# Patient Record
Sex: Female | Born: 1975 | Race: Black or African American | Hispanic: No | Marital: Married | Smoking: Current some day smoker
Health system: Southern US, Community
[De-identification: ages and names within clinical notes are randomized; demographics above are authoritative.]

## PROBLEM LIST (undated history)

## (undated) ENCOUNTER — Inpatient Hospital Stay (HOSPITAL_COMMUNITY): Payer: Self-pay

## (undated) ENCOUNTER — Emergency Department (HOSPITAL_COMMUNITY): Admission: EM | Payer: Medicaid Other | Source: Home / Self Care

## (undated) DIAGNOSIS — E669 Obesity, unspecified: Secondary | ICD-10-CM

## (undated) DIAGNOSIS — I10 Essential (primary) hypertension: Secondary | ICD-10-CM

## (undated) DIAGNOSIS — J9600 Acute respiratory failure, unspecified whether with hypoxia or hypercapnia: Secondary | ICD-10-CM

## (undated) DIAGNOSIS — O24419 Gestational diabetes mellitus in pregnancy, unspecified control: Secondary | ICD-10-CM

## (undated) DIAGNOSIS — F419 Anxiety disorder, unspecified: Secondary | ICD-10-CM

## (undated) DIAGNOSIS — J45909 Unspecified asthma, uncomplicated: Secondary | ICD-10-CM

## (undated) DIAGNOSIS — D509 Iron deficiency anemia, unspecified: Secondary | ICD-10-CM

## (undated) HISTORY — DX: Obesity, unspecified: E66.9

## (undated) HISTORY — PX: TONSILLECTOMY: SUR1361

## (undated) HISTORY — DX: Iron deficiency anemia, unspecified: D50.9

## (undated) HISTORY — DX: Anxiety disorder, unspecified: F41.9

## (undated) HISTORY — DX: Unspecified asthma, uncomplicated: J45.909

---
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
## 1998-02-21 ENCOUNTER — Emergency Department (HOSPITAL_COMMUNITY): Admission: EM | Admit: 1998-02-21 | Discharge: 1998-02-22 | Payer: Self-pay | Admitting: Emergency Medicine

## 1998-02-22 ENCOUNTER — Encounter: Payer: Self-pay | Admitting: Emergency Medicine

## 1999-03-22 ENCOUNTER — Inpatient Hospital Stay (HOSPITAL_COMMUNITY): Admission: AD | Admit: 1999-03-22 | Discharge: 1999-03-22 | Payer: Self-pay | Admitting: *Deleted

## 1999-05-02 ENCOUNTER — Inpatient Hospital Stay (HOSPITAL_COMMUNITY): Admission: AD | Admit: 1999-05-02 | Discharge: 1999-05-02 | Payer: Self-pay | Admitting: *Deleted

## 1999-06-08 ENCOUNTER — Ambulatory Visit (HOSPITAL_COMMUNITY): Admission: RE | Admit: 1999-06-08 | Discharge: 1999-06-08 | Payer: Self-pay | Admitting: *Deleted

## 1999-06-08 ENCOUNTER — Encounter: Payer: Self-pay | Admitting: *Deleted

## 1999-10-10 ENCOUNTER — Inpatient Hospital Stay (HOSPITAL_COMMUNITY): Admission: AD | Admit: 1999-10-10 | Discharge: 1999-10-10 | Payer: Self-pay | Admitting: *Deleted

## 1999-10-12 ENCOUNTER — Inpatient Hospital Stay (HOSPITAL_COMMUNITY): Admission: AD | Admit: 1999-10-12 | Discharge: 1999-10-14 | Payer: Self-pay | Admitting: *Deleted

## 1999-11-23 ENCOUNTER — Inpatient Hospital Stay (HOSPITAL_COMMUNITY): Admission: AD | Admit: 1999-11-23 | Discharge: 1999-11-23 | Payer: Self-pay | Admitting: *Deleted

## 2000-09-04 ENCOUNTER — Emergency Department (HOSPITAL_COMMUNITY): Admission: EM | Admit: 2000-09-04 | Discharge: 2000-09-04 | Payer: Self-pay | Admitting: Emergency Medicine

## 2001-04-09 ENCOUNTER — Other Ambulatory Visit: Admission: RE | Admit: 2001-04-09 | Discharge: 2001-04-09 | Payer: Self-pay | Admitting: Obstetrics and Gynecology

## 2001-04-17 ENCOUNTER — Ambulatory Visit (HOSPITAL_COMMUNITY): Admission: AD | Admit: 2001-04-17 | Discharge: 2001-04-17 | Payer: Self-pay | Admitting: Obstetrics and Gynecology

## 2001-04-17 ENCOUNTER — Encounter: Payer: Self-pay | Admitting: Obstetrics and Gynecology

## 2001-04-17 ENCOUNTER — Encounter (INDEPENDENT_AMBULATORY_CARE_PROVIDER_SITE_OTHER): Payer: Self-pay | Admitting: Specialist

## 2001-06-20 ENCOUNTER — Emergency Department (HOSPITAL_COMMUNITY): Admission: EM | Admit: 2001-06-20 | Discharge: 2001-06-20 | Payer: Self-pay | Admitting: Emergency Medicine

## 2001-06-20 ENCOUNTER — Encounter: Payer: Self-pay | Admitting: Emergency Medicine

## 2004-12-14 ENCOUNTER — Emergency Department (HOSPITAL_COMMUNITY): Admission: EM | Admit: 2004-12-14 | Discharge: 2004-12-14 | Payer: Self-pay | Admitting: Emergency Medicine

## 2005-01-16 ENCOUNTER — Encounter: Admission: RE | Admit: 2005-01-16 | Discharge: 2005-01-16 | Payer: Self-pay | Admitting: Rheumatology

## 2006-09-24 ENCOUNTER — Emergency Department (HOSPITAL_COMMUNITY): Admission: EM | Admit: 2006-09-24 | Discharge: 2006-09-24 | Payer: Self-pay | Admitting: Emergency Medicine

## 2007-02-03 ENCOUNTER — Inpatient Hospital Stay (HOSPITAL_COMMUNITY): Admission: AD | Admit: 2007-02-03 | Discharge: 2007-02-03 | Payer: Self-pay | Admitting: Obstetrics & Gynecology

## 2007-02-10 ENCOUNTER — Inpatient Hospital Stay (HOSPITAL_COMMUNITY): Admission: AD | Admit: 2007-02-10 | Discharge: 2007-02-10 | Payer: Self-pay | Admitting: Obstetrics

## 2007-05-06 ENCOUNTER — Other Ambulatory Visit: Payer: Self-pay | Admitting: Emergency Medicine

## 2007-05-06 ENCOUNTER — Ambulatory Visit: Payer: Self-pay | Admitting: *Deleted

## 2007-05-06 ENCOUNTER — Inpatient Hospital Stay (HOSPITAL_COMMUNITY): Admission: RE | Admit: 2007-05-06 | Discharge: 2007-05-08 | Payer: Self-pay | Admitting: *Deleted

## 2007-07-31 ENCOUNTER — Inpatient Hospital Stay (HOSPITAL_COMMUNITY): Admission: AD | Admit: 2007-07-31 | Discharge: 2007-07-31 | Payer: Self-pay | Admitting: Obstetrics

## 2007-08-02 ENCOUNTER — Inpatient Hospital Stay (HOSPITAL_COMMUNITY): Admission: AD | Admit: 2007-08-02 | Discharge: 2007-08-02 | Payer: Self-pay | Admitting: Obstetrics

## 2008-12-19 ENCOUNTER — Observation Stay (HOSPITAL_COMMUNITY): Admission: AD | Admit: 2008-12-19 | Discharge: 2008-12-20 | Payer: Self-pay | Admitting: Obstetrics & Gynecology

## 2008-12-20 ENCOUNTER — Encounter: Payer: Self-pay | Admitting: Obstetrics and Gynecology

## 2009-02-14 ENCOUNTER — Inpatient Hospital Stay (HOSPITAL_COMMUNITY): Admission: AD | Admit: 2009-02-14 | Discharge: 2009-02-14 | Payer: Self-pay | Admitting: Obstetrics and Gynecology

## 2009-04-02 ENCOUNTER — Inpatient Hospital Stay (HOSPITAL_COMMUNITY): Admission: AD | Admit: 2009-04-02 | Discharge: 2009-04-03 | Payer: Self-pay | Admitting: Obstetrics & Gynecology

## 2009-05-12 ENCOUNTER — Inpatient Hospital Stay (HOSPITAL_COMMUNITY): Admission: AD | Admit: 2009-05-12 | Discharge: 2009-05-12 | Payer: Self-pay | Admitting: Obstetrics and Gynecology

## 2009-05-17 ENCOUNTER — Inpatient Hospital Stay (HOSPITAL_COMMUNITY): Admission: AD | Admit: 2009-05-17 | Discharge: 2009-05-17 | Payer: Self-pay | Admitting: Obstetrics & Gynecology

## 2009-05-18 ENCOUNTER — Inpatient Hospital Stay (HOSPITAL_COMMUNITY): Admission: AD | Admit: 2009-05-18 | Discharge: 2009-05-19 | Payer: Self-pay | Admitting: Obstetrics & Gynecology

## 2010-03-28 ENCOUNTER — Emergency Department (HOSPITAL_COMMUNITY)
Admission: EM | Admit: 2010-03-28 | Discharge: 2010-03-28 | Payer: Self-pay | Source: Home / Self Care | Admitting: Emergency Medicine

## 2010-03-30 ENCOUNTER — Inpatient Hospital Stay (HOSPITAL_COMMUNITY): Admission: AD | Admit: 2010-03-30 | Discharge: 2009-04-26 | Payer: Self-pay | Admitting: Obstetrics & Gynecology

## 2010-07-04 LAB — POCT CARDIAC MARKERS
CKMB, poc: <1 ng/mL — ABNORMAL LOW (ref 1.0–8.0)
CKMB, poc: <1 ng/mL — ABNORMAL LOW (ref 1.0–8.0)
Myoglobin, poc: 47.3 ng/mL (ref 12–200)
Troponin i, poc: <0.05 ng/mL (ref 0.00–0.09)

## 2010-07-04 LAB — CBC
HCT: 36.5 % (ref 36.0–46.0)
Hemoglobin: 11.4 g/dL — ABNORMAL LOW (ref 12.0–15.0)
MCH: 23.7 pg — ABNORMAL LOW (ref 26.0–34.0)
RBC: 4.81 MIL/uL (ref 3.87–5.11)
WBC: 14.1 10*3/uL — ABNORMAL HIGH (ref 4.0–10.5)

## 2010-07-04 LAB — DIFFERENTIAL
Basophils Absolute: 0 10*3/uL (ref 0.0–0.1)
Basophils Relative: 0 % (ref 0–1)
Eosinophils Absolute: 0.5 10*3/uL (ref 0.0–0.7)
Eosinophils Relative: 3 % (ref 0–5)
Lymphs Abs: 3.6 10*3/uL (ref 0.7–4.0)
Monocytes Relative: 7 % (ref 3–12)
Neutro Abs: 9.2 10*3/uL — ABNORMAL HIGH (ref 1.7–7.7)
Neutrophils Relative %: 65 % (ref 43–77)

## 2010-07-04 LAB — D-DIMER, QUANTITATIVE: D-Dimer, Quant: 0.25 ug/mL-FEU (ref 0.00–0.48)

## 2010-07-04 LAB — POCT I-STAT, CHEM 8
BUN: 11 mg/dL (ref 6–23)
Creatinine, Ser: 0.7 mg/dL (ref 0.4–1.2)
Glucose, Bld: 96 mg/dL (ref 70–99)
Hemoglobin: 13.6 g/dL (ref 12.0–15.0)
Potassium: 3.4 mEq/L — ABNORMAL LOW (ref 3.5–5.1)
TCO2: 22 mmol/L (ref 0–100)

## 2010-07-10 LAB — CBC
HCT: 28.1 % — ABNORMAL LOW (ref 36.0–46.0)
MCHC: 31.2 g/dL (ref 30.0–36.0)
MCV: 78.4 fL (ref 78.0–100.0)
MCV: 79.5 fL (ref 78.0–100.0)
Platelets: 261 10*3/uL (ref 150–400)
RBC: 3.53 MIL/uL — ABNORMAL LOW (ref 3.87–5.11)
RDW: 13.1 % (ref 11.5–15.5)
WBC: 14.8 10*3/uL — ABNORMAL HIGH (ref 4.0–10.5)
WBC: 20.4 10*3/uL — ABNORMAL HIGH (ref 4.0–10.5)

## 2010-07-10 LAB — RPR: RPR Ser Ql: NONREACTIVE

## 2010-07-10 LAB — CCBB MATERNAL DONOR DRAW

## 2010-07-25 LAB — URINALYSIS, ROUTINE W REFLEX MICROSCOPIC
Hgb urine dipstick: NEGATIVE
Urobilinogen, UA: 1 mg/dL (ref 0.0–1.0)

## 2010-07-27 LAB — URINALYSIS, ROUTINE W REFLEX MICROSCOPIC
Glucose, UA: NEGATIVE mg/dL
Hgb urine dipstick: NEGATIVE
Ketones, ur: NEGATIVE mg/dL
Nitrite: NEGATIVE
Protein, ur: NEGATIVE mg/dL
Specific Gravity, Urine: 1.02 (ref 1.005–1.030)
Urobilinogen, UA: 1 mg/dL (ref 0.0–1.0)
pH: 6 (ref 5.0–8.0)

## 2010-07-27 LAB — WET PREP, GENITAL: Clue Cells Wet Prep HPF POC: NONE SEEN

## 2010-07-29 LAB — CULTURE, BLOOD (ROUTINE X 2): Culture: NO GROWTH

## 2010-07-29 LAB — DIFFERENTIAL
Basophils Absolute: 0.1 10*3/uL (ref 0.0–0.1)
Lymphocytes Relative: 13 % (ref 12–46)
Lymphs Abs: 2.7 10*3/uL (ref 0.7–4.0)
Neutro Abs: 16.3 10*3/uL — ABNORMAL HIGH (ref 1.7–7.7)
Neutrophils Relative %: 80 % — ABNORMAL HIGH (ref 43–77)

## 2010-07-29 LAB — CBC
HCT: 33.1 % — ABNORMAL LOW (ref 36.0–46.0)
Hemoglobin: 10.4 g/dL — ABNORMAL LOW (ref 12.0–15.0)
MCHC: 32 g/dL (ref 30.0–36.0)
Platelets: 259 10*3/uL (ref 150–400)
Platelets: 261 10*3/uL (ref 150–400)
RDW: 13 % (ref 11.5–15.5)
RDW: 13.1 % (ref 11.5–15.5)
WBC: 20.6 10*3/uL — ABNORMAL HIGH (ref 4.0–10.5)

## 2010-07-29 LAB — COMPREHENSIVE METABOLIC PANEL
ALT: 8 U/L (ref 0–35)
Alkaline Phosphatase: 46 U/L (ref 39–117)
BUN: 5 mg/dL — ABNORMAL LOW (ref 6–23)
CO2: 20 mEq/L (ref 19–32)
Chloride: 105 mEq/L (ref 96–112)
Glucose, Bld: 92 mg/dL (ref 70–99)
Potassium: 3.4 mEq/L — ABNORMAL LOW (ref 3.5–5.1)
Sodium: 134 mEq/L — ABNORMAL LOW (ref 135–145)
Total Bilirubin: 0.4 mg/dL (ref 0.3–1.2)
Total Protein: 6.7 g/dL (ref 6.0–8.3)

## 2010-07-29 LAB — URINALYSIS, ROUTINE W REFLEX MICROSCOPIC
Glucose, UA: NEGATIVE mg/dL
Hgb urine dipstick: NEGATIVE
Ketones, ur: NEGATIVE mg/dL
Nitrite: NEGATIVE
Urobilinogen, UA: 1 mg/dL (ref 0.0–1.0)

## 2010-07-29 LAB — BILE ACIDS, TOTAL: Bile Acids Total: 3 umol/L (ref 0–10)

## 2010-07-29 LAB — WET PREP, GENITAL
Clue Cells Wet Prep HPF POC: NONE SEEN
Yeast Wet Prep HPF POC: NONE SEEN

## 2010-07-29 LAB — GC/CHLAMYDIA PROBE AMP, GENITAL: Chlamydia, DNA Probe: NEGATIVE

## 2010-07-29 LAB — LIPASE, BLOOD: Lipase: 45 U/L (ref 11–59)
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
## 2010-09-08 NOTE — Op Note (Signed)
[facility]  Patient:    Jenna Stephens, Jenna Stephens Visit Number: [id number] MRN: [id number]          Service Type: DSU Location: [facility] Attending Physician:  Shaune Spittle Dictated by:   Maris Berger. Pennie Rushing, M.D. Proc. Date: 04/17/01 Admit Date:  04/17/2001                             Operative Report  PREOPERATIVE DIAGNOSES:       Incomplete spontaneous abortion.  POSTOPERATIVE DIAGNOSES:      Incomplete spontaneous abortion.  OPERATION:                    Suction dilatation and evacuation.  ANESTHESIA:                   Monitored anesthesia care and local.  ESTIMATED BLOOD LOSS:         Less than 100 cc.  COMPLICATIONS:                None.  FINDINGS:                     A moderate amount of products of conception were obtained at D&E.  SURGEON:                      Vanessa P. Pennie Rushing, M.D.  PROCEDURE:                    A discussion was held with the patient concerning the findings on ultrasound which included no viable intrauterine pregnancy, but products of conception in the endometrial cavity.  A recommendation was made for dilatation and evacuation and the risks of anesthesia, bleeding, infection, damage to adjacent organs, and uterine perforation all explained.  The patient wished to proceed.  She was taken to the operating room and placed on the operating table after appropriate identification.  She was placed in the lithotomy position and equipment for monitored anesthesia care placed.  The perineum and vagina were prepped with multiple layers of Betadine and draped as a sterile field.  A Red Robinson catheter was used to empty the bladder.  A Graves speculum was placed in the vagina and a single tooth tenaculum on the cervix.  The cervix was already open and dilated enough to accommodate a #8 suction catheter.  This was used to suck and evacuate all quadrants of the uterus.  A sharp curette was used to ensure that all products  of conception had been removed.  Hemostasis was noted to be adequate and all instruments were removed from the vagina.  The single tooth tenaculum was removed and silver nitrate used to cauterize the tenaculum sticks.  LABORATORIES:                 Blood type was pending at the time of this discharge.  FOLLOWUP:                     Two weeks with Dr. Pennie Rushing.  DISCHARGE INSTRUCTIONS:       Printed instructions from [facility].  DISCHARGE MEDICATIONS:        1. Methergine 0.2 mg p.o. q.6h. for eight doses.  2. Ibuprofen over-the-counter 600 mg p.o. q.6h.                                  p.r.n. pain.                               3. Doxycycline 100 mg p.o. b.i.d. for five days. Dictated by:   Maris Berger. Pennie Rushing, M.D. Attending Physician:  Shaune Spittle DD:  04/17/01 TD:  04/18/01 Job: [id number]

## 2010-09-08 NOTE — H&P (Signed)
[facility]  Patient:    Jenna Stephens, Jenna Stephens Visit Number: [id number] MRN: [id number]          Service Type: GYN Location: [facility] Attending Physician:  Shaune Spittle Dictated by:   Nigel Bridgeman, C.N.M. Admit Date:  04/17/2001                           History and Physical  HISTORY OF PRESENT ILLNESS:   Ms. Maryruth Bun is a 34 year old gravida 2, para 1-0-0-1 at approximately [redacted] weeks gestation who presented to the office today with heavy vaginal bleeding. She had been seen in the office last week and had had a new OB interview with an ultrasound that showed a viable intrauterine pregnancy at approximately [redacted] weeks gestation. She had onset of heavy bleeding and cramping yesterday soaking one pad every 30 minutes. She was having moderate cramping today. She presented to the office. At which time, she was sent to maternity admissions for lab work and a pelvic ultrasound.  PREGNANCY HAS BEEN REMARKABLE FOR:                          1. Positive anti LEA antibody.                               2. Previous smoker.  HISTORY OF PRESENT PREGNANCY:                    The patients last menstrual period was in August. By ultrasound last week, she was approximately 8 weeks. Blood type was done last week, and she was A positive. Hemoglobin was within normal limits. WBC count was 13.9.  OBSTETRICAL HISTORY:          The patient has had one previous vaginal birth.  MEDICAL HISTORY:              She reports the usual childhood illnesses. She has had a history of Trichomonas.  PREVIOUS HOSPITALIZATIONS:    Vaginal delivery x 1.  PAST SURGICAL HISTORY:        Tonsillectomy at age 77 and wisdom teeth removal.  SOCIAL HISTORY:               The patient is single. The father of the baby is involved and supportive. The patient is unemployed. She denies any alcohol or drug use during this pregnancy. She was a smoker prior to her positive UPT.  FAMILY HISTORY:                Noncontributory.  PHYSICAL EXAMINATION:  VITAL SIGNS:                  The patients temperature is 98.2, pulse is 77, blood pressure is 144/83, 137/72, orthostatics are essentially within normal limits, although there is a slight elevation of her pulse when she went from sitting to standing. Blood pressures are stable.  HEENT:                        Within normal limits.  LUNGS:                        Breath sounds are clear.  HEART:  Regular rate and rhythm without murmur.  BREASTS:                      Soft and nontender.  ABDOMEN:                      Soft, slightly tender to deep palpation.  PELVIC:                       Done in the office by Wynelle Bourgeois, C.N.M. External genitalia normal. Vagina remarkable for moderate to heavy vaginal bleeding. Cervix is closed. Uterus is small, slightly tender. Adnexa within normal limits. Rectovaginal deferred.  EXTREMITIES:                  Deep tendon reflexes are 2+ without clonus. There is a trace edema noted.  LABORATORY DATA:              CBC today shows hemoglobin of 10.6, hematocrit of 33.4, platelet count of 327, and WBC count of 16. WBC count at visit approximately one week ago was 13.9. ______ was also drawn. Ultrasound today shows no intrauterine gestational sac. Endometrial stripe was not well seen. The findings were favoring a retained products of conception with echogenic heterogeneous material of approximately 3 cm. Ovaries were within normal limits.  IMPRESSION:                   1. Incomplete spontaneous abortion.                               2. Anti LEA anitbody.  PLAN:                         1. Consulting with Vanessa P. Pennie Rushing, M.D. as                                  attending physician.                               2. Plan D&E with Vanessa P. Haygood, M.D.                                  planning to schedule per OR availability.                               3. Risks and  benefits of the procedure were                                  reviewed with patient and her support person,                                  including bleeding, infection, and risk of                                  damage to other organs. The patient last had  solids on December 25 at 7:30 p.m. and last                                  fluids December 26 at 7 a.m.                               4. Support offer for patient loss.                               5. Anti LEA antibody titer ordered. Dictated by:   Nigel Bridgeman, C.N.M. Attending Physician:  Shaune Spittle DD:  04/17/01 TD:  04/17/01 Job: [id number]

## 2010-09-08 NOTE — Consult Note (Signed)
[facility]  Patient:    Jenna Stephens, Jenna Stephens Visit Number: [id number] MRN: [id number]          Service Type: EXP Location: ED Attending Physician:  Tobey Bride Dictated by:   Cammy Copa, M.D. Admit Date:  06/20/2001 Discharge Date: 06/20/2001   CC:         Tomi Bamberger, P.A.   Consultation Report  REQUESTING PHYSICIAN:  Tomi Bamberger, P.A., [facility] Emergency Room.  CHIEF COMPLAINT:  Right ankle pain.  HISTORY OF PRESENT ILLNESS:  The patient is a 35 year old patient who slipped on the ice today. She reports right ankle pain and inability to bear weight. She denies any other orthopedic complaints. There was no loss of consciousness.  PAST MEDICAL/SURGICAL HISTORY:  Unremarkable.  MEDICATIONS:  She is currently is on no medication.  ALLERGIES:  No known drug allergies.  SOCIAL HISTORY:  She lives with her mother and brother at home.  PHYSICAL EXAMINATION:  EXTREMITIES:  She does have swelling around the lateral malleolus. Pedal pulses are intact. Sensation is intact on the dorsal plantar surface of the foot. She has no medial-sided tenderness. There is no swelling in the knee.  RADIOLOGY:  X-rays demonstrate minimally displaced lateral malleolar fracture. The mortis is maintained, and the fibular length is also maintained within 1 mm.  IMPRESSION:  Minimally displaced lateral malleolus fracture.  PLAN:  I am going to put her in a posterior splint, nonweightbearing for the next week. I will see back in the clinic with repeat x-rays, out of the splint and change over to cast at that time.  DISCHARGE MEDICATIONS:  Include Percocet for pain as well as an aspirin a day while she is nonweight bearing. Dictated by:   Cammy Copa, M.D. Attending Physician:  Tobey Bride DD:  06/20/01 TD:  06/20/01 Job: [id number]
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
## 2011-01-11 LAB — DIFFERENTIAL
Basophils Absolute: 0.3 — ABNORMAL HIGH
Basophils Relative: 2 — ABNORMAL HIGH
Eosinophils Absolute: 0.1
Eosinophils Relative: 1
Lymphocytes Relative: 13
Lymphs Abs: 1.9
Monocytes Absolute: 0.4
Monocytes Relative: 3
Neutro Abs: 12.2 — ABNORMAL HIGH
Neutrophils Relative %: 81 — ABNORMAL HIGH

## 2011-01-11 LAB — COMPREHENSIVE METABOLIC PANEL
ALT: 11
AST: 14
Albumin: 3.8
Alkaline Phosphatase: 62
BUN: 6
CO2: 23
Calcium: 9.1
Chloride: 105
Creatinine, Ser: 0.82
GFR calc Af Amer: >60
GFR calc non Af Amer: >60
Glucose, Bld: 97
Potassium: 3.5
Sodium: 138
Total Bilirubin: 0.5
Total Protein: 7.8

## 2011-01-11 LAB — CBC
HCT: 37.1
HCT: 33.7 — ABNORMAL LOW
Hemoglobin: 11.7 — ABNORMAL LOW
MCHC: 31.5
MCV: 72.1 — ABNORMAL LOW
MCV: 71.7 — ABNORMAL LOW
Platelets: 371
Platelets: 276
RBC: 5.15 — ABNORMAL HIGH
RDW: 14.9
WBC: 14.9 — ABNORMAL HIGH
WBC: 7.4

## 2011-01-11 LAB — URINALYSIS, ROUTINE W REFLEX MICROSCOPIC
Bilirubin Urine: NEGATIVE
Glucose, UA: NEGATIVE
Hgb urine dipstick: NEGATIVE
Ketones, ur: NEGATIVE
Nitrite: NEGATIVE
Protein, ur: NEGATIVE
Specific Gravity, Urine: 1.01
Urobilinogen, UA: 0.2
pH: 6

## 2011-01-11 LAB — PREGNANCY, URINE: Preg Test, Ur: NEGATIVE

## 2011-01-11 LAB — RAPID URINE DRUG SCREEN, HOSP PERFORMED
Amphetamines: POSITIVE — AB
Barbiturates: NOT DETECTED
Benzodiazepines: POSITIVE — AB
Cocaine: NOT DETECTED
Opiates: NOT DETECTED
Tetrahydrocannabinol: POSITIVE — AB

## 2011-01-11 LAB — ETHANOL: Alcohol, Ethyl (B): 17 — ABNORMAL HIGH

## 2011-01-11 LAB — ACETAMINOPHEN LEVEL: Acetaminophen (Tylenol), Serum: <10 — ABNORMAL LOW

## 2011-01-16 LAB — URINE MICROSCOPIC-ADD ON

## 2011-01-16 LAB — URINALYSIS, ROUTINE W REFLEX MICROSCOPIC
Glucose, UA: NEGATIVE
Protein, ur: NEGATIVE

## 2011-01-16 LAB — CBC
Hemoglobin: 10.6 — ABNORMAL LOW
RBC: 4.57
WBC: 15.7 — ABNORMAL HIGH

## 2011-01-16 LAB — HCG, QUANTITATIVE, PREGNANCY: hCG, Beta Chain, Quant, S: 387 — ABNORMAL HIGH

## 2011-01-16 LAB — ABO/RH: ABO/RH(D): A POS

## 2011-02-01 LAB — ABO/RH: ABO/RH(D): A POS

## 2011-02-01 LAB — URINE MICROSCOPIC-ADD ON

## 2011-02-01 LAB — URINALYSIS, ROUTINE W REFLEX MICROSCOPIC
Glucose, UA: NEGATIVE
Ketones, ur: NEGATIVE
Leukocytes, UA: NEGATIVE
pH: 6

## 2011-02-01 LAB — WET PREP, GENITAL: Clue Cells Wet Prep HPF POC: NONE SEEN

## 2011-02-01 LAB — CBC
Hemoglobin: 11.2 — ABNORMAL LOW
RBC: 4.99

## 2011-02-01 LAB — POCT PREGNANCY, URINE: Preg Test, Ur: POSITIVE

## 2011-02-01 LAB — GC/CHLAMYDIA PROBE AMP, GENITAL: GC Probe Amp, Genital: NEGATIVE

## 2012-03-22 ENCOUNTER — Encounter (HOSPITAL_COMMUNITY): Payer: Self-pay | Admitting: Emergency Medicine

## 2012-03-22 ENCOUNTER — Emergency Department (HOSPITAL_COMMUNITY): Payer: Self-pay

## 2012-03-22 ENCOUNTER — Emergency Department (HOSPITAL_COMMUNITY)
Admission: EM | Admit: 2012-03-22 | Discharge: 2012-03-22 | Disposition: A | Discharge status: Home / Self Care | Payer: Self-pay | Attending: Emergency Medicine | Admitting: Emergency Medicine

## 2012-03-22 DIAGNOSIS — IMO0002 Reserved for concepts with insufficient information to code with codable children: Secondary | ICD-10-CM | POA: Insufficient documentation

## 2012-03-22 DIAGNOSIS — M2570 Osteophyte, unspecified joint: Secondary | ICD-10-CM

## 2012-03-22 DIAGNOSIS — F17200 Nicotine dependence, unspecified, uncomplicated: Secondary | ICD-10-CM | POA: Insufficient documentation

## 2012-03-22 DIAGNOSIS — M25529 Pain in unspecified elbow: Secondary | ICD-10-CM | POA: Insufficient documentation

## 2012-03-22 DIAGNOSIS — M898X9 Other specified disorders of bone, unspecified site: Secondary | ICD-10-CM | POA: Insufficient documentation

## 2012-03-22 DIAGNOSIS — Y929 Unspecified place or not applicable: Secondary | ICD-10-CM | POA: Insufficient documentation

## 2012-03-22 DIAGNOSIS — Y939 Activity, unspecified: Secondary | ICD-10-CM | POA: Insufficient documentation

## 2012-03-22 MED ORDER — NAPROXEN 500 MG PO TABS: 500.0000 mg | ORAL_TABLET | Freq: Two times a day (BID) | ORAL | Status: DC
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
## 2012-03-22 NOTE — ED Provider Notes (Signed)
History     CSN: [id number]  Arrival date & time 03/22/12  1532   First MD Initiated Contact with Patient 03/22/12 1603      Chief Complaint  Patient presents with  . Elbow Pain    3 week hx of elbow pain.    (Consider location/radiation/quality/duration/timing/severity/associated sxs/prior treatment) HPI Comments: 36 year old female presents emergency department with chief complaint of left elbow pain.  Onset of symptoms began about 3-4 weeks ago after the patient reports striking her elbow very hard against a wall.  Since then the pain comes and goes and she has intermittent tingling of her fingers.  She denies any weakness of the extremity and states that it is tender to palpation.  No other complaints at this time.  The history is provided by the patient.    History reviewed. No pertinent past medical history.  History reviewed. No pertinent past surgical history.  Family History  Problem Relation Age of Onset  . Diabetes Mother   . Hypertension Mother     History  Substance Use Topics  . Smoking status: Current Every Day Smoker    Types: Cigarettes  . Smokeless tobacco: Not on file  . Alcohol Use: Yes    OB History    Grav Para Term Preterm Abortions TAB SAB Ect Mult Living                  Review of Systems  Constitutional: Negative for fever, diaphoresis and activity change.  HENT: Negative for congestion and neck pain.   Respiratory: Negative for cough.   Genitourinary: Negative for dysuria.  Musculoskeletal: Negative for myalgias.  Skin: Negative for color change and wound.  Neurological: Negative for headaches.  All other systems reviewed and are negative.    Allergies  Review of patient's allergies indicates no known allergies.  Home Medications   Current Outpatient Rx  Name  Route  Sig  Dispense  Refill  . ACETAMINOPHEN 500 MG PO TABS   Oral   Take 1,000 mg by mouth every 6 (six) hours as needed. For pain         . NAPROXEN SODIUM 220  MG PO TABS   Oral   Take 440 mg by mouth 2 (two) times daily as needed. For pain           BP 130/79  Pulse 97  Temp 98.1 F (36.7 C) (Oral)  SpO2 99%  LMP 02/26/2012  Physical Exam  Nursing note and vitals reviewed. Constitutional: She appears well-developed and well-nourished. No distress.  HENT:  Head: Normocephalic and atraumatic.  Eyes: Conjunctivae normal and EOM are normal.  Neck: Normal range of motion. Neck supple.  Cardiovascular:       Intact distal pulses, capillary refill < 3 seconds  Musculoskeletal:       Left upper extremity: ttp at  the olecranon process.  Normal flexion and extension. Pain free supination/pronation.  All other extremities with normal ROM  Neurological:       No sensory deficit  Skin: She is not diaphoretic.       Skin intact, no tenting    ED Course  Procedures (including critical care time)  Labs Reviewed - No data to display Dg Elbow Complete Left  03/22/2012  *RADIOLOGY REPORT*  Clinical Data: Elbow pain  LEFT ELBOW - COMPLETE 3+ VIEW  Comparison: None.  Findings: No acute fracture, malalignment, or elbow joint effusion. There is an osteophyte/enthesophyte at the insertion of the triceps tendon on the  olecranon process.  A faint lucency through the center of the bony prominence may represent a subacute fracture line.  IMPRESSION:  Possible subacute fracture of an osteophyte/enthesophyte in the region of the insertion of the triceps tendon on the posterior olecranon process.  Otherwise, negative radiographs of the elbow.   Original Report Authenticated By: Malachy Moan, M.D.      No diagnosis found.    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 Patient X-Ray negative for significant fracture or dislocation. Pain managed in ED. Pt advised to follow up with orthopedics if symptoms persist for possibility of missed fracture diagnosis.  Conservative therapy recommended and discussed. Patient will be dc home & is agreeable with above plan.          Jaci Carrel, [place] 03/22/12 1635

## 2012-03-22 NOTE — ED Provider Notes (Signed)
Medical screening examination/treatment/procedure(s) were performed by non-physician practitioner and as supervising physician I was immediately available for consultation/collaboration.  Camdyn Laden L Juliet Vasbinder, MD 03/22/12 2329 

## 2012-10-02 ENCOUNTER — Encounter (HOSPITAL_BASED_OUTPATIENT_CLINIC_OR_DEPARTMENT_OTHER): Payer: Self-pay | Admitting: *Deleted

## 2012-10-02 ENCOUNTER — Emergency Department (HOSPITAL_BASED_OUTPATIENT_CLINIC_OR_DEPARTMENT_OTHER)
Admission: EM | Admit: 2012-10-02 | Discharge: 2012-10-02 | Disposition: A | Discharge status: Home / Self Care | Payer: Self-pay | Attending: Emergency Medicine | Admitting: Emergency Medicine

## 2012-10-02 DIAGNOSIS — R05 Cough: Secondary | ICD-10-CM | POA: Insufficient documentation

## 2012-10-02 DIAGNOSIS — I10 Essential (primary) hypertension: Secondary | ICD-10-CM | POA: Insufficient documentation

## 2012-10-02 DIAGNOSIS — R6883 Chills (without fever): Secondary | ICD-10-CM | POA: Insufficient documentation

## 2012-10-02 DIAGNOSIS — J3489 Other specified disorders of nose and nasal sinuses: Secondary | ICD-10-CM | POA: Insufficient documentation

## 2012-10-02 DIAGNOSIS — R0602 Shortness of breath: Secondary | ICD-10-CM | POA: Insufficient documentation

## 2012-10-02 DIAGNOSIS — R059 Cough, unspecified: Secondary | ICD-10-CM | POA: Insufficient documentation

## 2012-10-02 DIAGNOSIS — R5381 Other malaise: Secondary | ICD-10-CM | POA: Insufficient documentation

## 2012-10-02 DIAGNOSIS — F17200 Nicotine dependence, unspecified, uncomplicated: Secondary | ICD-10-CM | POA: Insufficient documentation

## 2012-10-02 MED ORDER — AMOXICILLIN 500 MG PO CAPS: 500.0000 mg | ORAL_CAPSULE | Freq: Two times a day (BID) | ORAL | Status: DC

## 2012-10-02 MED ORDER — ALBUTEROL SULFATE HFA 108 (90 BASE) MCG/ACT IN AERS
INHALATION_SPRAY | RESPIRATORY_TRACT | Status: AC
  Filled 2012-10-02: qty 6.7

## 2012-10-02 MED ORDER — ALBUTEROL SULFATE HFA 108 (90 BASE) MCG/ACT IN AERS
2.0000 | INHALATION_SPRAY | Freq: Once | RESPIRATORY_TRACT | Status: AC
  Administered 2012-10-02: 2 via RESPIRATORY_TRACT

## 2012-10-02 NOTE — ED Notes (Signed)
2 week history of cough and congestion non productive cough taking alka seltzer and mucinex not helping

## 2012-10-02 NOTE — ED Provider Notes (Signed)
History     CSN: [id number]  Arrival date & time 10/02/12  4098   First MD Initiated Contact with Patient 10/02/12 [phone]      Chief Complaint  Patient presents with  . Nasal Congestion  . Cough     Patient is a 37 y.o. female presenting with cough. The history is provided by the patient.  Cough Cough characteristics:  Non-productive Severity:  Moderate Onset quality:  Gradual Duration:  2 weeks Timing:  Intermittent Progression:  Worsening Chronicity:  New Smoker: yes   Relieved by:  Nothing Worsened by:  Smoking Ineffective treatments:  Decongestant Associated symptoms: chills, shortness of breath and sinus congestion   Associated symptoms: no chest pain, no fever and no wheezing     PMH - none  History reviewed. No pertinent past surgical history.  Family History  Problem Relation Age of Onset  . Diabetes Mother   . Hypertension Mother     History  Substance Use Topics  . Smoking status: Current Every Day Smoker    Types: Cigarettes  . Smokeless tobacco: Not on file  . Alcohol Use: Yes    OB History   Grav Para Term Preterm Abortions TAB SAB Ect Mult Living                  Review of Systems  Constitutional: Positive for chills and fatigue. Negative for fever.  Respiratory: Positive for cough and shortness of breath. Negative for wheezing.   Cardiovascular: Negative for chest pain.  Gastrointestinal: Negative for vomiting.  Neurological: Negative for weakness.  All other systems reviewed and are negative.    Allergies  Review of patient's allergies indicates no known allergies.  Home Medications   Current Outpatient Rx  Name  Route  Sig  Dispense  Refill  . amoxicillin (AMOXIL) 500 MG capsule   Oral   Take 1 capsule (500 mg total) by mouth 2 (two) times daily.   14 capsule   0     BP 154/103  Pulse 81  Temp(Src) 97.9 F (36.6 C) (Oral)  Resp 20  SpO2 100%  LMP 09/30/2012  Physical Exam CONSTITUTIONAL: Well developed/well  nourished HEAD: Normocephalic/atraumatic EYES: EOMI/PERRL ENMT: Mucous membranes moist, nasal congestion NECK: supple no meningeal signs SPINE:entire spine nontender CV: S1/S2 noted, no murmurs/rubs/gallops noted LUNGS: Lungs are clear to auscultation bilaterally, no apparent distress, no crackles, no wheeze, no distress, no tachypnea noted on my evaluation ABDOMEN: soft, nontender, no rebound or guarding GU:no cva tenderness NEURO: Pt is awake/alert, moves all extremitiesx4 EXTREMITIES: pulses normal, full ROM No obvious pitting edema to bilateral LE SKIN: warm, color normal PSYCH: no abnormalities of mood noted  ED Course  Procedures   1. Cough   2. HTN (hypertension)    Pt is a smoker (advised to quit) with two weeks of nonproductive cough without hemoptysis She is well appearing, no distress No history/physical exam findings to suggest CHF/PE/ACS Given 2 weeks of cough will add on amoxicillin and trial of albuterol (no wheeze but she is a smoker, may benefit) She reports h/o HTN but is untreated and likely chronic I advised her f/u for this as outpatient    MDM  Nursing notes including past medical history and social history reviewed and considered in documentation         Joya Gaskins, MD 10/02/12 1008

## 2013-06-29 ENCOUNTER — Emergency Department (HOSPITAL_COMMUNITY)
Admission: EM | Admit: 2013-06-29 | Discharge: 2013-06-29 | Disposition: A | Discharge status: Home / Self Care | Payer: Medicaid Other | Attending: Emergency Medicine | Admitting: Emergency Medicine

## 2013-06-29 ENCOUNTER — Encounter (HOSPITAL_COMMUNITY): Payer: Self-pay | Admitting: Emergency Medicine

## 2013-06-29 ENCOUNTER — Emergency Department (HOSPITAL_COMMUNITY): Payer: Medicaid Other

## 2013-06-29 DIAGNOSIS — X500XXA Overexertion from strenuous movement or load, initial encounter: Secondary | ICD-10-CM | POA: Insufficient documentation

## 2013-06-29 DIAGNOSIS — F17200 Nicotine dependence, unspecified, uncomplicated: Secondary | ICD-10-CM | POA: Insufficient documentation

## 2013-06-29 DIAGNOSIS — Y939 Activity, unspecified: Secondary | ICD-10-CM | POA: Insufficient documentation

## 2013-06-29 DIAGNOSIS — Y929 Unspecified place or not applicable: Secondary | ICD-10-CM | POA: Insufficient documentation

## 2013-06-29 DIAGNOSIS — S93409A Sprain of unspecified ligament of unspecified ankle, initial encounter: Secondary | ICD-10-CM | POA: Insufficient documentation

## 2013-06-29 DIAGNOSIS — Z792 Long term (current) use of antibiotics: Secondary | ICD-10-CM | POA: Insufficient documentation

## 2013-06-29 MED ORDER — ONDANSETRON 8 MG PO TBDP
8.0000 mg | ORAL_TABLET | Freq: Once | ORAL | Status: AC
  Administered 2013-06-29: 8 mg via ORAL
  Filled 2013-06-29: qty 1

## 2013-06-29 MED ORDER — HYDROCODONE-ACETAMINOPHEN 5-325 MG PO TABS: 1.0000 | ORAL_TABLET | Freq: Four times a day (QID) | ORAL | Status: DC | PRN

## 2013-06-29 MED ORDER — PROMETHAZINE HCL 25 MG PO TABS: 25.0000 mg | ORAL_TABLET | Freq: Four times a day (QID) | ORAL | Status: DC | PRN

## 2013-06-29 MED ORDER — HYDROCODONE-ACETAMINOPHEN 5-325 MG PO TABS
2.0000 | ORAL_TABLET | Freq: Once | ORAL | Status: AC
  Administered 2013-06-29: 2 via ORAL
  Filled 2013-06-29: qty 2

## 2013-06-29 NOTE — ED Notes (Signed)
Pt states that Friday night she twisted her right ankle stepping off a curb. Slightly more swollen than left ankle. Pt able to bear some weight on right foot.

## 2013-06-29 NOTE — Discharge Instructions (Signed)
Acute Ankle Sprain  with Phase I Rehab  An acute ankle sprain is a partial or complete tear in one or more of the ligaments of the ankle due to traumatic injury. The severity of the injury depends on both the the number of ligaments sprained and the grade of sprain. There are 3 grades of sprains.   · A grade 1 sprain is a mild sprain. There is a slight pull without obvious tearing. There is no loss of strength, and the muscle and ligament are the correct length.  · A grade 2 sprain is a moderate sprain. There is tearing of fibers within the substance of the ligament where it connects two bones or two cartilages. The length of the ligament is increased, and there is usually decreased strength.  · A grade 3 sprain is a complete rupture of the ligament and is uncommon.  In addition to the grade of sprain, there are three types of ankle sprains.   Lateral ankle sprains: This is a sprain of one or more of the three ligaments on the outer side (lateral) of the ankle. These are the most common sprains.  Medial ankle sprains: There is one large triangular ligament of the inner side (medial) of the ankle that is susceptible to injury. Medial ankle sprains are less common.  Syndesmosis, "high ankle," sprains: The syndesmosis is the ligament that connects the two bones of the lower leg. Syndesmosis sprains usually only occur with very severe ankle sprains.  SYMPTOMS  · Pain, tenderness, and swelling in the ankle, starting at the side of injury that may progress to the whole ankle and foot with time.  · "Pop" or tearing sensation at the time of injury.  · Bruising that may spread to the heel.  · Impaired ability to walk soon after injury.  CAUSES   · Acute ankle sprains are caused by trauma placed on the ankle that temporarily forces or pries the anklebone (talus) out of its normal socket.  · Stretching or tearing of the ligaments that normally hold the joint in place (usually due to a twisting injury).  RISK INCREASES  WITH:  · Previous ankle sprain.  · Sports in which the foot may land awkwardly (ie. basketball, volleyball, or soccer) or walking or running on uneven or rough surfaces.  · Shoes with inadequate support to prevent sideways motion when stress occurs.  · Poor strength and flexibility.  · Poor balance skills.  · Contact sports.  PREVENTION   · Warm up and stretch properly before activity.  · Maintain physical fitness:  · Ankle and leg flexibility, muscle strength, and endurance.  · Cardiovascular fitness.  · Balance training activities.  · Use proper technique and have a coach correct improper technique.  · Taping, protective strapping, bracing, or high-top tennis shoes may help prevent injury. Initially, tape is best; however, it loses most of its support function within 10 to 15 minutes.  · Wear proper fitted protective shoes (High-top shoes with taping or bracing is more effective than either alone).  · Provide the ankle with support during sports and practice activities for 12 months following injury.  PROGNOSIS   · If treated properly, ankle sprains can be expected to recover completely; however, the length of recovery depends on the degree of injury.  · A grade 1 sprain usually heals enough in 5 to 7 days to allow modified activity and requires an average of 6 weeks to heal completely.  · A grade 2 sprain requires   6 to 10 weeks to heal completely.  · A grade 3 sprain requires 12 to 16 weeks to heal.  · A syndesmosis sprain often takes more than 3 months to heal.  RELATED COMPLICATIONS   · Frequent recurrence of symptoms may result in a chronic problem. Appropriately addressing the problem the first time decreases the frequency of recurrence and optimizes healing time. Severity of the initial sprain does not predict the likelihood of later instability.  · Injury to other structures (bone, cartilage, or tendon).  · A chronically unstable or arthritic ankle joint is a possiblity with repeated  sprains.  TREATMENT  Treatment initially involves the use of ice, medication, and compression bandages to help reduce pain and inflammation. Ankle sprains are usually immobilized in a walking cast or boot to allow for healing. Crutches may be recommended to reduce pressure on the injury. After immobilization, strengthening and stretching exercises may be necessary to regain strength and a full range of motion. Surgery is rarely needed to treat ankle sprains.  MEDICATION   · Nonsteroidal anti-inflammatory medications, such as aspirin and ibuprofen (do not take for the first 3 days after injury or within 7 days before surgery), or other minor pain relievers, such as acetaminophen, are often recommended. Take these as directed by your caregiver. Contact your caregiver immediately if any bleeding, stomach upset, or signs of an allergic reaction occur from these medications.  · Ointments applied to the skin may be helpful.  · Pain relievers may be prescribed as necessary by your caregiver. Do not take prescription pain medication for longer than 4 to 7 days. Use only as directed and only as much as you need.  HEAT AND COLD  · Cold treatment (icing) is used to relieve pain and reduce inflammation for acute and chronic cases. Cold should be applied for 10 to 15 minutes every 2 to 3 hours for inflammation and pain and immediately after any activity that aggravates your symptoms. Use ice packs or an ice massage.  · Heat treatment may be used before performing stretching and strengthening activities prescribed by your caregiver. Use a heat pack or a warm soak.  SEEK IMMEDIATE MEDICAL CARE IF:   · Pain, swelling, or bruising worsens despite treatment.  · You experience pain, numbness, discoloration, or coldness in the foot or toes.  · New, unexplained symptoms develop (drugs used in treatment may produce side effects.)  EXERCISES   PHASE I EXERCISES  RANGE OF MOTION (ROM) AND STRETCHING EXERCISES - Ankle Sprain, Acute Phase I,  Weeks 1 to 2  These exercises may help you when beginning to restore flexibility in your ankle. You will likely work on these exercises for the 1 to 2 weeks after your injury. Once your physician, physical therapist, or athletic trainer sees adequate progress, he or she will advance your exercises. While completing these exercises, remember:   · Restoring tissue flexibility helps normal motion to return to the joints. This allows healthier, less painful movement and activity.  · An effective stretch should be held for at least 30 seconds.  · A stretch should never be painful. You should only feel a gentle lengthening or release in the stretched tissue.  RANGE OF MOTION - Dorsi/Plantar Flexion  · While sitting with your right / left knee straight, draw the top of your foot upwards by flexing your ankle. Then reverse the motion, pointing your toes downward.  · Hold each position for __________ seconds.  · After completing your first set of   exercises, repeat this exercise with your knee bent.  Repeat __________ times. Complete this exercise __________ times per day.   RANGE OF MOTION - Ankle Alphabet  · Imagine your right / left big toe is a pen.  · Keeping your hip and knee still, write out the entire alphabet with your "pen." Make the letters as large as you can without increasing any discomfort.  Repeat __________ times. Complete this exercise __________ times per day.   STRENGTHENING EXERCISES - Ankle Sprain, Acute -Phase I, Weeks 1 to 2  These exercises may help you when beginning to restore strength in your ankle. You will likely work on these exercises for 1 to 2 weeks after your injury. Once your physician, physical therapist, or athletic trainer sees adequate progress, he or she will advance your exercises. While completing these exercises, remember:   · Muscles can gain both the endurance and the strength needed for everyday activities through controlled exercises.  · Complete these exercises as instructed by  your physician, physical therapist, or athletic trainer. Progress the resistance and repetitions only as guided.  · You may experience muscle soreness or fatigue, but the pain or discomfort you are trying to eliminate should never worsen during these exercises. If this pain does worsen, stop and make certain you are following the directions exactly. If the pain is still present after adjustments, discontinue the exercise until you can discuss the trouble with your clinician.  STRENGTH - Dorsiflexors  · Secure a rubber exercise band/tubing to a fixed object (ie. table, pole) and loop the other end around your right / left foot.  · Sit on the floor facing the fixed object. The band/tubing should be slightly tense when your foot is relaxed.  · Slowly draw your foot back toward you using your ankle and toes.  · Hold this position for __________ seconds. Slowly release the tension in the band and return your foot to the starting position.  Repeat __________ times. Complete this exercise __________ times per day.   STRENGTH - Plantar-flexors   · Sit with your right / left leg extended. Holding onto both ends of a rubber exercise band/tubing, loop it around the ball of your foot. Keep a slight tension in the band.  · Slowly push your toes away from you, pointing them downward.  · Hold this position for __________ seconds. Return slowly, controlling the tension in the band/tubing.  Repeat __________ times. Complete this exercise __________ times per day.   STRENGTH - Ankle Eversion  · Secure one end of a rubber exercise band/tubing to a fixed object (table, pole). Loop the other end around your foot just before your toes.  · Place your fists between your knees. This will focus your strengthening at your ankle.  · Drawing the band/tubing across your opposite foot, slowly, pull your little toe out and up. Make sure the band/tubing is positioned to resist the entire motion.  · Hold this position for __________ seconds.  Have  your muscles resist the band/tubing as it slowly pulls your foot back to the starting position.   Repeat __________ times. Complete this exercise __________ times per day.   STRENGTH - Ankle Inversion  · Secure one end of a rubber exercise band/tubing to a fixed object (table, pole). Loop the other end around your foot just before your toes.  · Place your fists between your knees. This will focus your strengthening at your ankle.  · Slowly, pull your big toe up and in, making   sure the band/tubing is positioned to resist the entire motion.  · Hold this position for __________ seconds.  · Have your muscles resist the band/tubing as it slowly pulls your foot back to the starting position.  Repeat __________ times. Complete this exercises __________ times per day.   STRENGTH - Towel Curls  · Sit in a chair positioned on a non-carpeted surface.  · Place your right / left foot on a towel, keeping your heel on the floor.  · Pull the towel toward your heel by only curling your toes. Keep your heel on the floor.  · If instructed by your physician, physical therapist, or athletic trainer, add weight to the end of the towel.  Repeat __________ times. Complete this exercise __________ times per day.  Document Released: 11/08/2004 Document Revised: 07/02/2011 Document Reviewed: 07/22/2008  [employer]® Patient Information ©2014 [employer].

## 2013-06-29 NOTE — ED Provider Notes (Signed)
Medical screening examination/treatment/procedure(s) were performed by non-physician practitioner and as supervising physician I was immediately available for consultation/collaboration.  Carmin Muskrat, MD 06/29/13 [phone]

## 2013-06-29 NOTE — ED Provider Notes (Signed)
CSN: [id number]     Arrival date & time 06/29/13  1208 History  This chart was scribed for Elwyn Lade, PA-C, working with Carmin Muskrat, MD, by Sydell Axon, ED Scribe. This patient was seen in room WTR5/WTR5 and the patient's care was started at 1:21 PM.  Chief Complaint  Patient presents with  . Ankle Pain   HPI  HPI Comments: Jenna Stephens is a 38 y.o. female who presents to the Emergency Department complaining of R ankle pain with mild swelling; onset 3 days ago. Patient reports she stepped on the side of a curb and twisted her ankle. She reports the pain as a constant, achy/shooting pain that is non changing and that radiates to her upper R leg. She further states her pain is made worse with pressure and movement. Patient reports she has broken her R ankle in 2002, and states this pain is not as severe. She is ambulatory and reports being able to bear some of her weight on her foot. Patient reports taking tylenol and Advil with no relief.  History reviewed. No pertinent past medical history. History reviewed. No pertinent past surgical history. Family History  Problem Relation Age of Onset  . Diabetes Mother   . Hypertension Mother    History  Substance Use Topics  . Smoking status: Current Every Day Smoker    Types: Cigarettes  . Smokeless tobacco: Not on file  . Alcohol Use: Yes   OB History   Grav Para Term Preterm Abortions TAB SAB Ect Mult Living                 Review of Systems  Constitutional: Negative for fever and chills.  Respiratory: Negative for shortness of breath.   Gastrointestinal: Negative for nausea and vomiting.  Musculoskeletal: Positive for arthralgias (R ankle pain) and joint swelling.  Neurological: Negative for weakness.  All other systems reviewed and are negative.   Allergies  Review of patient's allergies indicates no known allergies.  Home Medications   Current Outpatient Rx  Name  Route  Sig  Dispense  Refill  . amoxicillin  (AMOXIL) 500 MG capsule   Oral   Take 1 capsule (500 mg total) by mouth 2 (two) times daily.   14 capsule   0    Triage Vitals: BP 151/91  Pulse 72  Temp(Src) 98.2 F (36.8 C) (Oral)  Resp 18  Ht 5' 7.5" (1.715 m)  SpO2 98%  LMP 06/22/2013  Physical Exam  Nursing note and vitals reviewed. Constitutional: She is oriented to person, place, and time. She appears well-developed and well-nourished. No distress.  HENT:  Head: Normocephalic and atraumatic.  Right Ear: External ear normal.  Left Ear: External ear normal.  Nose: Nose normal.  Mouth/Throat: Oropharynx is clear and moist.  Eyes: Conjunctivae and EOM are normal.  Neck: Normal range of motion. Neck supple.  Cardiovascular: Normal rate, regular rhythm, normal heart sounds, intact distal pulses and normal pulses.   Pulses:      Posterior tibial pulses are 2+ on the right side, and 2+ on the left side.  Capillary refill < 3 seconds in all toes  Pulmonary/Chest: Effort normal and breath sounds normal. No stridor. No respiratory distress. She has no wheezes. She has no rales.  Abdominal: Soft. She exhibits no distension.  Musculoskeletal: Normal range of motion. She exhibits tenderness.  TTP to lateral aspect of R ankle. Squeeze test negative. Significant swelling to right ankle. Neurovascularly intact. Compartment soft.   Neurological:  She is alert and oriented to person, place, and time. She has normal strength. Gait (antalgic) abnormal.  Skin: Skin is warm and dry. She is not diaphoretic. No erythema.  Psychiatric: She has a normal mood and affect. Her behavior is normal.   ED Course  Procedures (including critical care time)  DIAGNOSTIC STUDIES: Oxygen Saturation is 98% on room air, normal by my interpretation.    COORDINATION OF CARE: 1:25 PM-Will order x-ray and f/u with patient.Treatment plan discussed with patient and patient agrees.  Labs Review Labs Reviewed - No data to display Imaging Review Dg Ankle  Complete Right  06/29/2013   CLINICAL DATA:  Twisted right ankle, pain.  EXAM: RIGHT ANKLE - COMPLETE 3+ VIEW  COMPARISON:  None.  FINDINGS: Diffuse soft tissue swelling. Mild degenerative change. No fracture or dislocation.  IMPRESSION: Diffuse soft tissue swelling.  No fracture or dislocation.   Electronically Signed   By: Rolla Flatten M.D.   On: 06/29/2013 13:37     EKG Interpretation None      MDM   Final diagnoses:  Ankle sprain   Imaging shows no fracture. Directed pt to ice injury, take acetaminophen or ibuprofen for pain, and to elevate and rest the injury when possible. Neurovascularly intact. Compartment soft. Squeeze test negative. No concern for interosseous injury. ASO brace and crutches were given for comfort. Provided back to work note. Return instructions given. Vital signs stable for discharge. Patient / Family / Caregiver informed of clinical course, understand medical decision-making process, and agree with plan.    Elwyn Lade, PA-C 06/29/13 1456

## 2014-02-13 ENCOUNTER — Encounter (HOSPITAL_COMMUNITY): Payer: Self-pay | Admitting: Emergency Medicine

## 2014-02-13 ENCOUNTER — Emergency Department (HOSPITAL_COMMUNITY)
Admission: EM | Admit: 2014-02-13 | Discharge: 2014-02-13 | Disposition: A | Discharge status: Home / Self Care | Payer: Medicaid Other | Attending: Emergency Medicine | Admitting: Emergency Medicine

## 2014-02-13 DIAGNOSIS — K029 Dental caries, unspecified: Secondary | ICD-10-CM | POA: Insufficient documentation

## 2014-02-13 DIAGNOSIS — R630 Anorexia: Secondary | ICD-10-CM | POA: Insufficient documentation

## 2014-02-13 DIAGNOSIS — R112 Nausea with vomiting, unspecified: Secondary | ICD-10-CM | POA: Insufficient documentation

## 2014-02-13 DIAGNOSIS — Z720 Tobacco use: Secondary | ICD-10-CM | POA: Insufficient documentation

## 2014-02-13 DIAGNOSIS — K0889 Other specified disorders of teeth and supporting structures: Secondary | ICD-10-CM

## 2014-02-13 DIAGNOSIS — K0381 Cracked tooth: Secondary | ICD-10-CM | POA: Insufficient documentation

## 2014-02-13 DIAGNOSIS — H9209 Otalgia, unspecified ear: Secondary | ICD-10-CM | POA: Insufficient documentation

## 2014-02-13 DIAGNOSIS — K088 Other specified disorders of teeth and supporting structures: Secondary | ICD-10-CM | POA: Insufficient documentation

## 2014-02-13 DIAGNOSIS — R51 Headache: Secondary | ICD-10-CM | POA: Insufficient documentation

## 2014-02-13 MED ORDER — OXYCODONE-ACETAMINOPHEN 5-325 MG PO TABS
1.0000 | ORAL_TABLET | Freq: Once | ORAL | Status: AC
  Administered 2014-02-13: 1 via ORAL
  Filled 2014-02-13: qty 1

## 2014-02-13 MED ORDER — OXYCODONE-ACETAMINOPHEN 5-325 MG PO TABS: 1.0000 | ORAL_TABLET | Freq: Four times a day (QID) | ORAL | Status: DC | PRN

## 2014-02-13 NOTE — ED Notes (Signed)
AVS explained in detail. Knows to follow up with DDS as soon as possible. Knows not to drink/drive/operate heavy machinery with prescribed medications. Ambulatory with steady gait. No other complaints/concerns.

## 2014-02-13 NOTE — ED Provider Notes (Signed)
CSN: [id number]     Arrival date & time 02/13/14  1436 History  This chart was scribed for a non-physician practitioner, Vivi Barrack, PA-C, working with Varney Biles, MD by Cathie Hoops, ED Scribe. The patient was seen in [facility]. The patient's care was started at 4:24 PM.   Chief Complaint  Patient presents with  . Dental Pain    The history is provided by the patient. No language interpreter was used.   HPI Comments: Jenna Stephens is a 38 y.o. female who presents to the Emergency Department complaining of new, moderate, constant, gradually worsening lower, right molar dental pain onset one month ago. Pt has associated headache, right ear pain, chills, nausea, vomiting, and decreased appetite. Pt states her lower, right tooth broke and she went to the dentist, Jeni Salles, and was prescribed penicillin and Norco. Pt has been taking the medications for two days and states she feels her symptoms have worsened. She states she needs to have the tooth removed but cannot afford it due to lack of insurance. Pt smokes but denies smoking since her tooth pain began. Pt denies fevers, facial swelling, SOB, trouble swallowing, gum discharge, swelling in the mouth.   History reviewed. No pertinent past medical history. No past surgical history on file. Family History  Problem Relation Age of Onset  . Diabetes Mother   . Hypertension Mother    History  Substance Use Topics  . Smoking status: Current Every Day Smoker    Types: Cigarettes  . Smokeless tobacco: Not on file  . Alcohol Use: Yes   OB History   Grav Para Term Preterm Abortions TAB SAB Ect Mult Living                 Review of Systems  Constitutional: Positive for chills and appetite change. Negative for fever.  HENT: Positive for dental problem and ear pain. Negative for facial swelling and trouble swallowing.   Respiratory: Negative for shortness of breath.   Gastrointestinal: Positive for nausea and vomiting.   Neurological: Positive for headaches.   Allergies  Review of patient's allergies indicates no known allergies.  Home Medications   Prior to Admission medications   Medication Sig Start Date End Date Taking? Authorizing Provider  HYDROcodone-acetaminophen (NORCO) 10-325 MG per tablet Take 1 tablet by mouth every 4 (four) hours as needed for moderate pain or severe pain.   Yes Historical Provider, MD  penicillin v potassium (VEETID) 500 MG tablet Take 500 mg by mouth every 6 (six) hours. Until completed   Yes Historical Provider, MD  oxyCODONE-acetaminophen (PERCOCET/ROXICET) 5-325 MG per tablet Take 1 tablet by mouth every 6 (six) hours as needed for moderate pain or severe pain. 02/13/14   Dezirae Service A Forcucci, PA-C   Triage Vitals: BP 154/101  Pulse 92  Temp(Src) 98.3 F (36.8 C) (Oral)  Resp 20  SpO2 100%  LMP 02/10/2014  Physical Exam  Nursing note and vitals reviewed. Constitutional: She is oriented to person, place, and time. She appears well-developed and well-nourished. No distress.  HENT:  Head: Normocephalic and atraumatic.  Nose: Nose normal.  Mouth/Throat: Uvula is midline and oropharynx is clear and moist. She does not have dentures. No oral lesions. No trismus in the jaw. Dental caries present. No dental abscesses, uvula swelling or lacerations. No oropharyngeal exudate.    Eyes: Conjunctivae and EOM are normal. Pupils are equal, round, and reactive to light. No scleral icterus.  Neck: Normal range of motion. Neck supple. No tracheal deviation  present. No thyromegaly present.  Cardiovascular: Normal rate, regular rhythm, normal heart sounds and intact distal pulses.  Exam reveals no gallop and no friction rub.   No murmur heard. Pulmonary/Chest: Effort normal and breath sounds normal. No respiratory distress. She has no wheezes. She has no rales. She exhibits no tenderness.  Musculoskeletal: Normal range of motion.  Lymphadenopathy:    She has no cervical  adenopathy.  Neurological: She is alert and oriented to person, place, and time.  Skin: Skin is warm and dry.  Psychiatric: She has a normal mood and affect. Her behavior is normal. Judgment and thought content normal.    ED Course  Procedures (including critical care time) DIAGNOSTIC STUDIES: Oxygen Saturation is 100% on RA, normal by my interpretation.    COORDINATION OF CARE: 4:32 PM- Patient informed of current plan for treatment and evaluation and agrees with plan at this time.  MDM   Final diagnoses:  Pain, dental   Patient is a 38 y.o. Female who presents with dental pain.  Physical exam reveals no trismus or signs of ludwigs angina.  There is a fractured tooth with exposed pulp.  Patient already on amoxicillin.  Have offered dental block which the patient declined.  Patient to be given oxycodone.  Patient to be discharged home with oxycodone.  Will given dental resource list.  Patient to return for symptoms of trismus or ludwigs angina.    I personally performed the services described in this documentation, which was scribed in my presence. The recorded information has been reviewed and is accurate.    Jamie Kato Forcucci, PA-C 02/13/14 1640

## 2014-02-13 NOTE — Discharge Instructions (Signed)

## 2014-02-13 NOTE — ED Notes (Signed)
She c/o having a lower right toothache x 1 month.  Saw a provider this Thurs., who prescribed pcn and pain med.  She states she now has some right-sided h/a, plus right earache.  She is in no distress.

## 2014-02-14 NOTE — ED Provider Notes (Signed)
Medical screening examination/treatment/procedure(s) were performed by non-physician practitioner and as supervising physician I was immediately available for consultation/collaboration.   EKG Interpretation None       Varney Biles, MD 02/14/14 2707

## 2014-06-01 ENCOUNTER — Other Ambulatory Visit: Payer: Self-pay | Admitting: Obstetrics and Gynecology

## 2014-06-02 LAB — CYTOLOGY - PAP

## 2014-06-10 ENCOUNTER — Encounter (HOSPITAL_BASED_OUTPATIENT_CLINIC_OR_DEPARTMENT_OTHER): Payer: Self-pay

## 2014-06-10 ENCOUNTER — Emergency Department (HOSPITAL_BASED_OUTPATIENT_CLINIC_OR_DEPARTMENT_OTHER)
Admission: EM | Admit: 2014-06-10 | Discharge: 2014-06-10 | Disposition: A | Discharge status: Home / Self Care | Payer: 59 | Attending: Emergency Medicine | Admitting: Emergency Medicine

## 2014-06-10 DIAGNOSIS — B349 Viral infection, unspecified: Secondary | ICD-10-CM | POA: Insufficient documentation

## 2014-06-10 DIAGNOSIS — R197 Diarrhea, unspecified: Secondary | ICD-10-CM | POA: Diagnosis present

## 2014-06-10 DIAGNOSIS — Z720 Tobacco use: Secondary | ICD-10-CM | POA: Diagnosis not present

## 2014-06-10 DIAGNOSIS — Z3202 Encounter for pregnancy test, result negative: Secondary | ICD-10-CM | POA: Insufficient documentation

## 2014-06-10 LAB — CBC WITH DIFFERENTIAL/PLATELET
BASOS ABS: 0 10*3/uL (ref 0.0–0.1)
Basophils Relative: 0 % (ref 0–1)
Eosinophils Absolute: 0.1 10*3/uL (ref 0.0–0.7)
Eosinophils Relative: 2 % (ref 0–5)
HEMATOCRIT: 38.4 % (ref 36.0–46.0)
HEMOGLOBIN: 12.1 g/dL (ref 12.0–15.0)
Lymphocytes Relative: 32 % (ref 12–46)
Lymphs Abs: 1.9 10*3/uL (ref 0.7–4.0)
MCH: 23.9 pg — ABNORMAL LOW (ref 26.0–34.0)
MCHC: 31.5 g/dL (ref 30.0–36.0)
MCV: 75.7 fL — AB (ref 78.0–100.0)
MONO ABS: 0.7 10*3/uL (ref 0.1–1.0)
MONOS PCT: 11 % (ref 3–12)
NEUTROS ABS: 3.4 10*3/uL (ref 1.7–7.7)
Neutrophils Relative %: 55 % (ref 43–77)
Platelets: 257 10*3/uL (ref 150–400)
RBC: 5.07 MIL/uL (ref 3.87–5.11)
RDW: 12.8 % (ref 11.5–15.5)
WBC: 6.1 10*3/uL (ref 4.0–10.5)

## 2014-06-10 LAB — URINALYSIS, ROUTINE W REFLEX MICROSCOPIC
Glucose, UA: NEGATIVE mg/dL
HGB URINE DIPSTICK: NEGATIVE
Ketones, ur: 15 mg/dL — AB
Leukocytes, UA: NEGATIVE
Nitrite: NEGATIVE
PROTEIN: NEGATIVE mg/dL
Specific Gravity, Urine: 1.03 (ref 1.005–1.030)
UROBILINOGEN UA: 1 mg/dL (ref 0.0–1.0)
pH: 5.5 (ref 5.0–8.0)

## 2014-06-10 LAB — BASIC METABOLIC PANEL
ANION GAP: 5 (ref 5–15)
BUN: 11 mg/dL (ref 6–23)
CALCIUM: 8.4 mg/dL (ref 8.4–10.5)
CHLORIDE: 108 mmol/L (ref 96–112)
CO2: 25 mmol/L (ref 19–32)
CREATININE: 0.73 mg/dL (ref 0.50–1.10)
GFR calc Af Amer: >90 mL/min (ref >90)
GFR calc non Af Amer: >90 mL/min (ref >90)
Glucose, Bld: 87 mg/dL (ref 70–99)
Potassium: 3.7 mmol/L (ref 3.5–5.1)
Sodium: 138 mmol/L (ref 135–145)

## 2014-06-10 LAB — PREGNANCY, URINE: PREG TEST UR: NEGATIVE

## 2014-06-10 MED ORDER — HYDROCODONE-ACETAMINOPHEN 5-325 MG PO TABS
1.0000 | ORAL_TABLET | Freq: Once | ORAL | Status: AC
Start: 2014-06-10 — End: 2014-06-10
  Administered 2014-06-10: 1 via ORAL
  Filled 2014-06-10: qty 1

## 2014-06-10 MED ORDER — SODIUM CHLORIDE 0.9 % IV SOLN
INTRAVENOUS | Status: AC
  Administered 2014-06-10: 15:00:00 via INTRAVENOUS

## 2014-06-10 MED ORDER — ONDANSETRON HCL 4 MG PO TABS: 4.0000 mg | ORAL_TABLET | Freq: Four times a day (QID) | ORAL | Status: DC

## 2014-06-10 MED ORDER — ONDANSETRON HCL 4 MG/2ML IJ SOLN
4.0000 mg | Freq: Once | INTRAMUSCULAR | Status: AC
  Administered 2014-06-10: 4 mg via INTRAVENOUS
  Filled 2014-06-10: qty 2

## 2014-06-10 MED ORDER — SODIUM CHLORIDE 0.9 % IV SOLN: INTRAVENOUS | Status: DC

## 2014-06-10 NOTE — ED Notes (Signed)
Pt reports that her HA is still bothering her. EDP made aware, orders received.

## 2014-06-10 NOTE — ED Notes (Signed)
NP at bedside.

## 2014-06-10 NOTE — ED Provider Notes (Signed)
CSN: [id number]     Arrival date & time 06/10/14  1147 History   First MD Initiated Contact with Patient 06/10/14 1430     Chief Complaint  Patient presents with  . Diarrhea     (Consider location/radiation/quality/duration/timing/severity/associated sxs/prior Treatment) Patient is a 39 y.o. female presenting with diarrhea. The history is provided by the patient.  Diarrhea Quality:  Watery Severity:  Moderate Onset quality:  Gradual Duration:  3 days Timing:  Intermittent Progression:  Unchanged Relieved by:  Nothing Worsened by:  Nothing tried Ineffective treatments:  Anti-motility medications (pepto) Associated symptoms: headaches and myalgias   Associated symptoms: no chills, no recent cough, no fever and no vomiting  Abdominal pain: cramping.   Associated symptoms comment:  Nausea   Jenna Stephens is a 39 y.o. female who presents to the ED with diarrhea, nausea and body aches and headache that started 3 days ago.  History reviewed. No pertinent past medical history. History reviewed. No pertinent past surgical history. Family History  Problem Relation Age of Onset  . Diabetes Mother   . Hypertension Mother    History  Substance Use Topics  . Smoking status: Current Some Day Smoker  . Smokeless tobacco: Not on file  . Alcohol Use: No   OB History    No data available     Review of Systems  Constitutional: Negative for fever and chills.  Eyes: Negative for redness, itching and visual disturbance.  Respiratory: Negative for cough, chest tightness, shortness of breath and wheezing.   Cardiovascular: Negative for chest pain and palpitations.  Gastrointestinal: Positive for diarrhea. Negative for vomiting. Abdominal pain: cramping.  Genitourinary: Positive for decreased urine volume. Negative for dysuria, urgency, frequency, vaginal bleeding and vaginal discharge.  Musculoskeletal: Positive for myalgias. Negative for back pain.  Skin: Positive for rash.   Neurological: Positive for light-headedness and headaches. Negative for syncope.  Psychiatric/Behavioral: Negative for confusion. The patient is not nervous/anxious.       Allergies  Review of patient's allergies indicates no known allergies.  Home Medications   Prior to Admission medications   Medication Sig Start Date End Date Taking? Authorizing Provider  ondansetron (ZOFRAN) 4 MG tablet Take 1 tablet (4 mg total) by mouth every 6 (six) hours. 06/10/14   Hope Bunnie Pion, NP   BP 158/95 mmHg  Pulse 85  Temp(Src) 99.1 F (37.3 C) (Oral)  Resp 16  Ht 5\' 9"  (1.753 m)  SpO2 100%  LMP 06/02/2014 Physical Exam  Constitutional: She is oriented to person, place, and time. She appears well-developed and well-nourished.  HENT:  Head: Normocephalic and atraumatic.  Right Ear: Tympanic membrane normal.  Left Ear: Tympanic membrane normal.  Nose: Nose normal.  Mouth/Throat: Uvula is midline, oropharynx is clear and moist and mucous membranes are normal.  Eyes: Conjunctivae and EOM are normal. Pupils are equal, round, and reactive to light.  Neck: Normal range of motion. Neck supple.  Cardiovascular: Normal rate and regular rhythm.   Pulmonary/Chest: Effort normal and breath sounds normal.  Abdominal: Soft. Bowel sounds are increased. There is no tenderness. There is no CVA tenderness.  Musculoskeletal: Normal range of motion.  Neurological: She is alert and oriented to person, place, and time. No cranial nerve deficit.  Skin: Skin is warm and dry.  Psychiatric: She has a normal mood and affect. Her behavior is normal.  Nursing note and vitals reviewed.   ED Course  Procedures (including critical care time) Labs Review Results for orders placed or performed  during the hospital encounter of 06/10/14 (from the past 24 hour(s))  Urinalysis, Routine w reflex microscopic     Status: Abnormal   Collection Time: 06/10/14 12:00 PM  Result Value Ref Range   Color, Urine YELLOW YELLOW    APPearance CLOUDY (A) CLEAR   Specific Gravity, Urine 1.030 1.005 - 1.030   pH 5.5 5.0 - 8.0   Glucose, UA NEGATIVE NEGATIVE mg/dL   Hgb urine dipstick NEGATIVE NEGATIVE   Bilirubin Urine SMALL (A) NEGATIVE   Ketones, ur 15 (A) NEGATIVE mg/dL   Protein, ur NEGATIVE NEGATIVE mg/dL   Urobilinogen, UA 1.0 0.0 - 1.0 mg/dL   Nitrite NEGATIVE NEGATIVE   Leukocytes, UA NEGATIVE NEGATIVE  Pregnancy, urine     Status: None   Collection Time: 06/10/14 12:00 PM  Result Value Ref Range   Preg Test, Ur NEGATIVE NEGATIVE  CBC with Differential/Platelet     Status: Abnormal   Collection Time: 06/10/14  3:20 PM  Result Value Ref Range   WBC 6.1 4.0 - 10.5 K/uL   RBC 5.07 3.87 - 5.11 MIL/uL   Hemoglobin 12.1 12.0 - 15.0 g/dL   HCT 38.4 36.0 - 46.0 %   MCV 75.7 (L) 78.0 - 100.0 fL   MCH 23.9 (L) 26.0 - 34.0 pg   MCHC 31.5 30.0 - 36.0 g/dL   RDW 12.8 11.5 - 15.5 %   Platelets 257 150 - 400 K/uL   Neutrophils Relative % 55 43 - 77 %   Neutro Abs 3.4 1.7 - 7.7 K/uL   Lymphocytes Relative 32 12 - 46 %   Lymphs Abs 1.9 0.7 - 4.0 K/uL   Monocytes Relative 11 3 - 12 %   Monocytes Absolute 0.7 0.1 - 1.0 K/uL   Eosinophils Relative 2 0 - 5 %   Eosinophils Absolute 0.1 0.0 - 0.7 K/uL   Basophils Relative 0 0 - 1 %   Basophils Absolute 0.0 0.0 - 0.1 K/uL  Basic metabolic panel     Status: None   Collection Time: 06/10/14  3:20 PM  Result Value Ref Range   Sodium 138 135 - 145 mmol/L   Potassium 3.7 3.5 - 5.1 mmol/L   Chloride 108 96 - 112 mmol/L   CO2 25 19 - 32 mmol/L   Glucose, Bld 87 70 - 99 mg/dL   BUN 11 6 - 23 mg/dL   Creatinine, Ser 0.73 0.50 - 1.10 mg/dL   Calcium 8.4 8.4 - 10.5 mg/dL   GFR calc non Af Amer >90 >90 mL/min   GFR calc Af Amer >90 >90 mL/min   Anion gap 5 5 - 15      MDM  39 y.o. female with diarrhea and nausea with body aches x 3 days. Feeling better after IV hydration and Zofran for nausea. She has not had diarrhea since arrival to the ED. Nausea has subsided.  Headache has improved. Stable for d/c. Discussed with the patient elevated BP and need for follow up. Will treat for nausea and diet given for diarrhea. Patient to return for worsening symptoms.    Final diagnoses:  Diarrhea  Viral illness     Ashley Murrain, NP 06/10/14 2155  Malvin Johns, MD 06/11/14 [phone]

## 2014-06-10 NOTE — ED Notes (Signed)
C/o diarrhea, nausea, body yaches x 3 days

## 2015-05-22 ENCOUNTER — Encounter (HOSPITAL_COMMUNITY): Payer: Self-pay

## 2015-05-22 ENCOUNTER — Emergency Department (HOSPITAL_COMMUNITY)
Admission: EM | Admit: 2015-05-22 | Discharge: 2015-05-22 | Disposition: A | Discharge status: Home / Self Care | Payer: 59 | Attending: Emergency Medicine | Admitting: Emergency Medicine

## 2015-05-22 ENCOUNTER — Emergency Department (HOSPITAL_COMMUNITY): Payer: 59

## 2015-05-22 DIAGNOSIS — I10 Essential (primary) hypertension: Secondary | ICD-10-CM | POA: Diagnosis not present

## 2015-05-22 DIAGNOSIS — F17200 Nicotine dependence, unspecified, uncomplicated: Secondary | ICD-10-CM | POA: Insufficient documentation

## 2015-05-22 DIAGNOSIS — R51 Headache: Secondary | ICD-10-CM | POA: Diagnosis not present

## 2015-05-22 DIAGNOSIS — Z3202 Encounter for pregnancy test, result negative: Secondary | ICD-10-CM | POA: Diagnosis not present

## 2015-05-22 DIAGNOSIS — R519 Headache, unspecified: Secondary | ICD-10-CM

## 2015-05-22 DIAGNOSIS — R55 Syncope and collapse: Secondary | ICD-10-CM

## 2015-05-22 HISTORY — DX: Essential (primary) hypertension: I10

## 2015-05-22 LAB — BASIC METABOLIC PANEL
ANION GAP: 10 (ref 5–15)
BUN: 12 mg/dL (ref 6–20)
CALCIUM: 9.1 mg/dL (ref 8.9–10.3)
CO2: 24 mmol/L (ref 22–32)
Chloride: 107 mmol/L (ref 101–111)
Creatinine, Ser: 0.86 mg/dL (ref 0.44–1.00)
Glucose, Bld: 98 mg/dL (ref 65–99)
Potassium: 3.8 mmol/L (ref 3.5–5.1)
Sodium: 141 mmol/L (ref 135–145)

## 2015-05-22 LAB — CBG MONITORING, ED: GLUCOSE-CAPILLARY: 97 mg/dL (ref 65–99)

## 2015-05-22 LAB — URINALYSIS, ROUTINE W REFLEX MICROSCOPIC
Bilirubin Urine: NEGATIVE
Glucose, UA: NEGATIVE mg/dL
Hgb urine dipstick: NEGATIVE
Ketones, ur: NEGATIVE mg/dL
LEUKOCYTES UA: NEGATIVE
NITRITE: NEGATIVE
Protein, ur: NEGATIVE mg/dL
SPECIFIC GRAVITY, URINE: 1.017 (ref 1.005–1.030)
pH: 6.5 (ref 5.0–8.0)

## 2015-05-22 LAB — PREGNANCY, URINE: Preg Test, Ur: NEGATIVE

## 2015-05-22 LAB — CBC
HCT: 37.8 % (ref 36.0–46.0)
HEMOGLOBIN: 11.9 g/dL — AB (ref 12.0–15.0)
MCH: 24 pg — ABNORMAL LOW (ref 26.0–34.0)
MCHC: 31.5 g/dL (ref 30.0–36.0)
MCV: 76.4 fL — ABNORMAL LOW (ref 78.0–100.0)
Platelets: 363 10*3/uL (ref 150–400)
RBC: 4.95 MIL/uL (ref 3.87–5.11)
RDW: 13.6 % (ref 11.5–15.5)
WBC: 13.9 10*3/uL — AB (ref 4.0–10.5)

## 2015-05-22 MED ORDER — SODIUM CHLORIDE 0.9 % IV BOLUS (SEPSIS)
1000.0000 mL | Freq: Once | INTRAVENOUS | Status: AC
  Administered 2015-05-22: 1000 mL via INTRAVENOUS

## 2015-05-22 MED ORDER — PROMETHAZINE HCL 25 MG PO TABS: 25.0000 mg | ORAL_TABLET | Freq: Four times a day (QID) | ORAL | Status: DC | PRN

## 2015-05-22 MED ORDER — DIPHENHYDRAMINE HCL 50 MG/ML IJ SOLN
25.0000 mg | Freq: Once | INTRAMUSCULAR | Status: AC
  Administered 2015-05-22: 25 mg via INTRAVENOUS
  Filled 2015-05-22: qty 1

## 2015-05-22 MED ORDER — METOCLOPRAMIDE HCL 5 MG/ML IJ SOLN
10.0000 mg | Freq: Once | INTRAMUSCULAR | Status: AC
  Administered 2015-05-22: 10 mg via INTRAVENOUS
  Filled 2015-05-22: qty 2

## 2015-05-22 NOTE — ED Notes (Signed)
Per pt, headache x 1 week.  Pt was walking to bedroom today.  Passed out.  Landed on back.  Denies other pain except headache.  No recent illness symptoms.  Some nausea today.  No fevers. No previous hx of same.  Does have HTN.  Not currently taking her meds.

## 2015-05-22 NOTE — ED Provider Notes (Signed)
CSN: [id number]     Arrival date & time 05/22/15  1150 History   First MD Initiated Contact with Patient 05/22/15 1203     Chief Complaint  Patient presents with  . Loss of Consciousness  . Headache     The history is provided by the patient. No language interpreter was used.   Jenna Stephens is a 40 y.o. female who presents to the Emergency Department complaining of syncope.  She reports 1 week of left-sided headache. It is frontal and posterior nature and was gradual in onset. Today she was getting up and walked down the hall and had a syncopal event. She felt lightheaded before the event. She landed on her back and is not sure if she hit her head. She has ongoing worsening headache. She has photophobia at times. She denies any fevers, vision changes, numbness, weakness. She does have associated nausea. She does not have a history of frequent headaches. She has a history of high blood pressure but does not currently take any medications for this. No personal or family history of aneurysm or stroke.  Past Medical History  Diagnosis Date  . Hypertension    History reviewed. No pertinent past surgical history. Family History  Problem Relation Age of Onset  . Diabetes Mother   . Hypertension Mother    Social History  Substance Use Topics  . Smoking status: Current Some Day Smoker  . Smokeless tobacco: None  . Alcohol Use: No   OB History    No data available     Review of Systems  All other systems reviewed and are negative.     Allergies  Review of patient's allergies indicates no known allergies.  Home Medications   Prior to Admission medications   Medication Sig Start Date End Date Taking? Authorizing Provider  Aspirin-Salicylamide-Caffeine (BC FAST PAIN RELIEF) [phone] MG PACK Take 1 packet by mouth daily as needed (for headache).   Yes Historical Provider, MD  ondansetron (ZOFRAN) 4 MG tablet Take 1 tablet (4 mg total) by mouth every 6 (six) hours. Patient  not taking: Reported on 05/22/2015 06/10/14   Ashley Murrain, NP  promethazine (PHENERGAN) 25 MG tablet Take 1 tablet (25 mg total) by mouth every 6 (six) hours as needed for nausea or vomiting. 05/22/15   Quintella Reichert, MD   BP 129/74 mmHg  Pulse 67  Temp(Src) 98.8 F (37.1 C) (Oral)  Resp 20  SpO2 100%  LMP 05/08/2015 Physical Exam  Constitutional: She is oriented to person, place, and time. She appears well-developed and well-nourished.  HENT:  Head: Normocephalic and atraumatic.  Eyes: EOM are normal. Pupils are equal, round, and reactive to light.  Neck: Neck supple.  Cardiovascular: Normal rate and regular rhythm.   No murmur heard. Pulmonary/Chest: Effort normal and breath sounds normal. No respiratory distress.  Abdominal: Soft. There is no tenderness. There is no rebound and no guarding.  Musculoskeletal: She exhibits no edema or tenderness.  Neurological: She is alert and oriented to person, place, and time. No cranial nerve deficit.  5/5 strength in all four extremities.  Skin: Skin is warm and dry.  Psychiatric: She has a normal mood and affect. Her behavior is normal.  Nursing note and vitals reviewed.   ED Course  Procedures (including critical care time) Labs Review Labs Reviewed  CBC - Abnormal; Notable for the following:    WBC 13.9 (*)    Hemoglobin 11.9 (*)    MCV 76.4 (*)  MCH 24.0 (*)    All other components within normal limits  URINALYSIS, ROUTINE W REFLEX MICROSCOPIC (NOT AT [facility]) - Abnormal; Notable for the following:    APPearance CLOUDY (*)    All other components within normal limits  BASIC METABOLIC PANEL  PREGNANCY, URINE  CBG MONITORING, ED    Imaging Review Ct Head Wo Contrast  05/22/2015  CLINICAL DATA:  Syncope with headache. EXAM: CT HEAD WITHOUT CONTRAST TECHNIQUE: Contiguous axial images were obtained from the base of the skull through the vertex without intravenous contrast. COMPARISON:  None. FINDINGS: There is no evidence for acute  hemorrhage, hydrocephalus, mass lesion, or abnormal extra-axial fluid collection. No definite CT evidence for acute infarction. The visualized paranasal sinuses and mastoid air cells are clear. IMPRESSION: Normal exam. Electronically Signed   By: Misty Stanley M.D.   On: 05/22/2015 13:54   I have personally reviewed and evaluated these images and lab results as part of my medical decision-making.   EKG Interpretation   Date/Time:  Sunday May 22 2015 12:04:25 EST Ventricular Rate:  83 PR Interval:  156 QRS Duration: 102 QT Interval:  387 QTC Calculation: 455 R Axis:   27 Text Interpretation:  Sinus rhythm Abnormal R-wave progression, early  transition Confirmed by Hazle Coca [phone]) on 05/22/2015 12:51:25 PM      MDM   Final diagnoses:  Syncope and collapse  Bad headache    Pt here for evaluation of headache and syncope.  In terms of headache - this resolved after treatment in ED, nonfocal neurologic examination.  Presentation is not c/w SAH, meningitis, CVA, dural sinus thrombosis, hypertensive urgency.  Pt with likely migraine.  Discussed ibuprofen at home, phenergan as needed with outpatient follow up as well close return precautions.  In terms of syncope - this appears to be a vasovagal event.  Presentation is not c/w cardiogenic syncope, PE, CVA.     Quintella Reichert, MD 05/22/15 1555

## 2015-05-22 NOTE — Discharge Instructions (Signed)
General Headache Without Cause A headache is pain or discomfort felt around the head or neck area. The specific cause of a headache may not be found. There are many causes and types of headaches. A few common ones are:  Tension headaches.  Migraine headaches.  Cluster headaches.  Chronic daily headaches. HOME CARE INSTRUCTIONS  Watch your condition for any changes. Take these steps to help with your condition: Managing Pain  Take over-the-counter and prescription medicines only as told by your health care provider.  Lie down in a dark, quiet room when you have a headache.  If directed, apply ice to the head and neck area:  Put ice in a plastic bag.  Place a towel between your skin and the bag.  Leave the ice on for 20 minutes, 2-3 times per day.  Use a heating pad or hot shower to apply heat to the head and neck area as told by your health care provider.  Keep lights dim if bright lights bother you or make your headaches worse. Eating and Drinking  Eat meals on a regular schedule.  Limit alcohol use.  Decrease the amount of caffeine you drink, or stop drinking caffeine. General Instructions  Keep all follow-up visits as told by your health care provider. This is important.  Keep a headache journal to help find out what may trigger your headaches. For example, write down:  What you eat and drink.  How much sleep you get.  Any change to your diet or medicines.  Try massage or other relaxation techniques.  Limit stress.  Sit up straight, and do not tense your muscles.  Do not use tobacco products, including cigarettes, chewing tobacco, or e-cigarettes. If you need help quitting, ask your health care provider.  Exercise regularly as told by your health care provider.  Sleep on a regular schedule. Get 7-9 hours of sleep, or the amount recommended by your health care provider. SEEK MEDICAL CARE IF:   Your symptoms are not helped by medicine.  You have a  headache that is different from the usual headache.  You have nausea or you vomit.  You have a fever. SEEK IMMEDIATE MEDICAL CARE IF:   Your headache becomes severe.  You have repeated vomiting.  You have a stiff neck.  You have a loss of vision.  You have problems with speech.  You have pain in the eye or ear.  You have muscular weakness or loss of muscle control.  You lose your balance or have trouble walking.  You feel faint or pass out.  You have confusion.   This information is not intended to replace advice given to you by your health care provider. Make sure you discuss any questions you have with your health care provider.   Document Released: 04/09/2005 Document Revised: 12/29/2014 Document Reviewed: 08/02/2014 Elsevier Interactive Patient Education 2016 [employer].  Syncope Syncope is a medical term for fainting or passing out. This means you lose consciousness and drop to the ground. People are generally unconscious for less than 5 minutes. You may have some muscle twitches for up to 15 seconds before waking up and returning to normal. Syncope occurs more often in older adults, but it can happen to anyone. While most causes of syncope are not dangerous, syncope can be a sign of a serious medical problem. It is important to seek medical care.  CAUSES  Syncope is caused by a sudden drop in blood flow to the brain. The specific cause is often  not determined. Factors that can bring on syncope include:  Taking medicines that lower blood pressure.  Sudden changes in posture, such as standing up quickly.  Taking more medicine than prescribed.  Standing in one place for too long.  Seizure disorders.  Dehydration and excessive exposure to heat.  Low blood sugar (hypoglycemia).  Straining to have a bowel movement.  Heart disease, irregular heartbeat, or other circulatory problems.  Fear, emotional distress, seeing blood, or severe pain. SYMPTOMS  Right  before fainting, you may:  Feel dizzy or light-headed.  Feel nauseous.  See all white or all black in your field of vision.  Have cold, clammy skin. DIAGNOSIS  Your health care provider will ask about your symptoms, perform a physical exam, and perform an electrocardiogram (ECG) to record the electrical activity of your heart. Your health care provider may also perform other heart or blood tests to determine the cause of your syncope which may include:  Transthoracic echocardiogram (TTE). During echocardiography, sound waves are used to evaluate how blood flows through your heart.  Transesophageal echocardiogram (TEE).  Cardiac monitoring. This allows your health care provider to monitor your heart rate and rhythm in real time.  Holter monitor. This is a portable device that records your heartbeat and can help diagnose heart arrhythmias. It allows your health care provider to track your heart activity for several days, if needed.  Stress tests by exercise or by giving medicine that makes the heart beat faster. TREATMENT  In most cases, no treatment is needed. Depending on the cause of your syncope, your health care provider may recommend changing or stopping some of your medicines. HOME CARE INSTRUCTIONS  Have someone stay with you until you feel stable.  Do not drive, use machinery, or play sports until your health care provider says it is okay.  Keep all follow-up appointments as directed by your health care provider.  Lie down right away if you start feeling like you might faint. Breathe deeply and steadily. Wait until all the symptoms have passed.  Drink enough fluids to keep your urine clear or pale yellow.  If you are taking blood pressure or heart medicine, get up slowly and take several minutes to sit and then stand. This can reduce dizziness. SEEK IMMEDIATE MEDICAL CARE IF:   You have a severe headache.  You have unusual pain in the chest, abdomen, or back.  You are  bleeding from your mouth or rectum, or you have black or tarry stool.  You have an irregular or very fast heartbeat.  You have pain with breathing.  You have repeated fainting or seizure-like jerking during an episode.  You faint when sitting or lying down.  You have confusion.  You have trouble walking.  You have severe weakness.  You have vision problems. If you fainted, call your local emergency services (911 in [place] Do not drive yourself to the hospital.    This information is not intended to replace advice given to you by your health care provider. Make sure you discuss any questions you have with your health care provider.   Document Released: 04/09/2005 Document Revised: 08/24/2014 Document Reviewed: 06/08/2011 Elsevier Interactive Patient Education [employer].

## 2016-01-17 ENCOUNTER — Emergency Department (HOSPITAL_COMMUNITY)
Admission: EM | Admit: 2016-01-17 | Discharge: 2016-01-17 | Disposition: A | Discharge status: Home / Self Care | Payer: 59 | Attending: Emergency Medicine | Admitting: Emergency Medicine

## 2016-01-17 ENCOUNTER — Encounter (HOSPITAL_COMMUNITY): Payer: Self-pay

## 2016-01-17 DIAGNOSIS — I10 Essential (primary) hypertension: Secondary | ICD-10-CM | POA: Insufficient documentation

## 2016-01-17 DIAGNOSIS — F17200 Nicotine dependence, unspecified, uncomplicated: Secondary | ICD-10-CM | POA: Diagnosis not present

## 2016-01-17 DIAGNOSIS — G5601 Carpal tunnel syndrome, right upper limb: Secondary | ICD-10-CM | POA: Diagnosis not present

## 2016-01-17 DIAGNOSIS — Z7982 Long term (current) use of aspirin: Secondary | ICD-10-CM | POA: Diagnosis not present

## 2016-01-17 DIAGNOSIS — M79641 Pain in right hand: Secondary | ICD-10-CM | POA: Diagnosis present

## 2016-01-17 NOTE — ED Provider Notes (Signed)
[facility] DEPT Provider Note   CSN: [id number] Arrival date & time: 01/17/16  [id number]     History   Chief Complaint Chief Complaint  Patient presents with  . Hand Pain    HPI Jenna Stephens is a 40 y.o. female.   HPI   History of female presents today with right hand pain. Patient reports that over the last 1 month. She's had intermittent right hand pain with numbness in the fingers. 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 She reports initially it was just pain, followed by intermittent numbness. She reports this is not persistent, comes and goes. She reports the pain is gradually getting worse. With decreased strength in the hand, again none persistent. She denies any infectious etiology, reports worsening pain with palpation of the proximal volar hand. She denies any change in color of the skin, I will pain, shoulder pain, or any other concerning signs or symptoms.  Past Medical History:  Diagnosis Date  . Hypertension     There are no active problems to display for this patient.   History reviewed. No pertinent surgical history.  OB History    No data available       Home Medications    Prior to Admission medications   Medication Sig Start Date End Date Taking? Authorizing Provider  Aspirin-Salicylamide-Caffeine (BC FAST PAIN RELIEF) 650-195-33.3 MG PACK Take 1 packet by mouth daily as needed (for headache).    Historical Provider, MD  ondansetron (ZOFRAN) 4 MG tablet Take 1 tablet (4 mg total) by mouth every 6 (six) hours. Patient not taking: Reported on 05/22/2015 06/10/14   Ashley Murrain, NP  promethazine (PHENERGAN) 25 MG tablet Take 1 tablet (25 mg total) by mouth every 6 (six) hours as needed for nausea or vomiting. 05/22/15   Quintella Reichert, MD    Family History Family History  Problem Relation Age of Onset  . Diabetes Mother   . Hypertension Mother     Social History Social History  Substance Use Topics  . Smoking status: Current Some  Day Smoker  . Smokeless tobacco: Never Used  . Alcohol use No     Allergies   Review of patient's allergies indicates no known allergies.   Review of Systems Review of Systems  All other systems reviewed and are negative.   Physical Exam Updated Vital Signs BP (!) 165/113 (BP Location: Left Arm)   Pulse 83   Temp 98.1 F (36.7 C) (Oral)   Resp 18   SpO2 98%   Physical Exam  Constitutional: She is oriented to person, place, and time. She appears well-developed and well-nourished.  HENT:  Head: Normocephalic and atraumatic.  Eyes: Conjunctivae are normal. Pupils are equal, round, and reactive to light. Right eye exhibits no discharge. Left eye exhibits no discharge. No scleral icterus.  Neck: Normal range of motion. No JVD present. No tracheal deviation present.  Pulmonary/Chest: Effort normal. No stridor.  Musculoskeletal:  Tenderness. Palpation of the oral aspect of the right wrist. No obvious swelling, warmth to touch. Sensation intact, full active range of motion of the wrist and fingers. Pain with flexion of the wrist. Grip strength 5 out of 5  Neurological: She is alert and oriented to person, place, and time. Coordination normal.  Psychiatric: She has a normal mood and affect. Her behavior is normal. Judgment and thought content normal.  Nursing note and vitals reviewed.    ED Treatments / Results  Labs (all labs ordered are listed, but  only abnormal results are displayed) Labs Reviewed - No data to display  EKG  EKG Interpretation None       Radiology No results found.  Procedures Procedures (including critical care time)  Medications Ordered in ED Medications - No data to display   Initial Impression / Assessment and Plan / ED Course  I have reviewed the triage vital signs and the nursing notes.  Pertinent labs & imaging results that were available during my care of the patient were reviewed by me and considered in my medical decision making (see  chart for details).  Clinical Course   Patient's presentation is most consistent with carpal tunnel. She has no signs of compartment syndrome, no infectious etiology. She will be given a wrist splint, symptomatic care instructions. She is given strict return cautioned and appropriate follow-up information with hand surgery. She verbalizes understanding and agreement to today's 1. [place] questions, concerns time discharge  Final Clinical Impressions(s) / ED Diagnoses   Final diagnoses:  Carpal tunnel syndrome of right wrist    New Prescriptions New Prescriptions   No medications on file     Okey Regal, PA-C 01/17/16 [place], MD 01/17/16 [phone]

## 2016-01-17 NOTE — Discharge Instructions (Signed)
Please use splint at all times except when bathing. Please ibuprofen as needed for discomfort. Please try and reduce aggravating activities. Please follow up with orthopedic hand surgery if symptoms persist or worsen.

## 2016-01-17 NOTE — ED Triage Notes (Signed)
Pt here with rt hand pain/numbness. Started a couple of weeks ago.  Pt does type at work.

## 2016-03-18 ENCOUNTER — Emergency Department (HOSPITAL_COMMUNITY)
Admission: EM | Admit: 2016-03-18 | Discharge: 2016-03-18 | Disposition: A | Discharge status: Home / Self Care | Payer: 59 | Attending: Emergency Medicine | Admitting: Emergency Medicine

## 2016-03-18 ENCOUNTER — Encounter (HOSPITAL_COMMUNITY): Payer: Self-pay

## 2016-03-18 DIAGNOSIS — F17200 Nicotine dependence, unspecified, uncomplicated: Secondary | ICD-10-CM | POA: Insufficient documentation

## 2016-03-18 DIAGNOSIS — Z79899 Other long term (current) drug therapy: Secondary | ICD-10-CM | POA: Diagnosis not present

## 2016-03-18 DIAGNOSIS — I10 Essential (primary) hypertension: Secondary | ICD-10-CM | POA: Diagnosis not present

## 2016-03-18 DIAGNOSIS — J020 Streptococcal pharyngitis: Secondary | ICD-10-CM | POA: Insufficient documentation

## 2016-03-18 DIAGNOSIS — Z7982 Long term (current) use of aspirin: Secondary | ICD-10-CM | POA: Diagnosis not present

## 2016-03-18 DIAGNOSIS — J029 Acute pharyngitis, unspecified: Secondary | ICD-10-CM | POA: Diagnosis present

## 2016-03-18 LAB — RAPID STREP SCREEN (MED CTR MEBANE ONLY): Streptococcus, Group A Screen (Direct): POSITIVE — AB

## 2016-03-18 MED ORDER — PENICILLIN G BENZATHINE 1200000 UNIT/2ML IM SUSP
1.2000 10*6.[IU] | Freq: Once | INTRAMUSCULAR | Status: AC
  Administered 2016-03-18: 1.2 10*6.[IU] via INTRAMUSCULAR
  Filled 2016-03-18: qty 2

## 2016-03-18 NOTE — ED Triage Notes (Signed)
She c/o sore throat since Wed. And "bug bite" lower right lat. Leg/ankle area. She is in no distress. Mild, congested cough noted.

## 2016-03-18 NOTE — ED Provider Notes (Signed)
[facility] DEPT Provider Note   CSN: [id number] Arrival date & time: 03/18/16  [id number]     History   Chief Complaint Chief Complaint  Patient presents with  . Sore Throat    HPI Jenna Stephens is a 40 y.o. female.  40 year old female presents with several-day history of sore throat. Patient notes positive strep exposure. She had a fever 2 days ago but none since. Denies any vomiting. No rashes or photophobia. Has been using over-the-counter medication without relief. Symptoms are worse with swallowing.      Past Medical History:  Diagnosis Date  . Hypertension     There are no active problems to display for this patient.   No past surgical history on file.  OB History    No data available       Home Medications    Prior to Admission medications   Medication Sig Start Date End Date Taking? Authorizing Provider  Aspirin-Salicylamide-Caffeine (BC FAST PAIN RELIEF) 650-195-33.3 MG PACK Take 1 packet by mouth daily as needed (for headache).    Historical Provider, MD  ondansetron (ZOFRAN) 4 MG tablet Take 1 tablet (4 mg total) by mouth every 6 (six) hours. Patient not taking: Reported on 05/22/2015 06/10/14   Ashley Murrain, NP  promethazine (PHENERGAN) 25 MG tablet Take 1 tablet (25 mg total) by mouth every 6 (six) hours as needed for nausea or vomiting. 05/22/15   Quintella Reichert, MD    Family History Family History  Problem Relation Age of Onset  . Diabetes Mother   . Hypertension Mother     Social History Social History  Substance Use Topics  . Smoking status: Current Some Day Smoker  . Smokeless tobacco: Never Used  . Alcohol use No     Allergies   Patient has no known allergies.   Review of Systems Review of Systems  All other systems reviewed and are negative.    Physical Exam Updated Vital Signs BP 157/99 (BP Location: Right Arm)   Pulse 107   Temp 98 F (36.7 C) (Oral)   Resp 20   LMP 02/23/2016 (Exact Date)   SpO2 96%   Physical  Exam  Constitutional: She is oriented to person, place, and time. She appears well-developed and well-nourished.  Non-toxic appearance.  HENT:  Head: Normocephalic and atraumatic.  Mouth/Throat: Posterior oropharyngeal erythema present. No oropharyngeal exudate or tonsillar abscesses.  Eyes: Conjunctivae are normal. Pupils are equal, round, and reactive to light.  Neck: Normal range of motion.  Cardiovascular: Normal rate.   Pulmonary/Chest: Effort normal.  Neurological: She is alert and oriented to person, place, and time.  Skin: Skin is warm and dry.  Psychiatric: She has a normal mood and affect.  Nursing note and vitals reviewed.    ED Treatments / Results  Labs (all labs ordered are listed, but only abnormal results are displayed) Labs Reviewed  RAPID STREP SCREEN (NOT AT [facility])    EKG  EKG Interpretation None       Radiology No results found.  Procedures Procedures (including critical care time)  Medications Ordered in ED Medications - No data to display   Initial Impression / Assessment and Plan / ED Course  I have reviewed the triage vital signs and the nursing notes.  Pertinent labs & imaging results that were available during my care of the patient were reviewed by me and considered in my medical decision making (see chart for details).  Clinical Course     Patient be treated  for presumptive strep throat with Bicillin and return precautions given  Final Clinical Impressions(s) / ED Diagnoses   Final diagnoses:  None    New Prescriptions New Prescriptions   No medications on file     Lacretia Leigh, MD 03/18/16 [phone]

## 2016-03-20 ENCOUNTER — Encounter (HOSPITAL_COMMUNITY): Payer: Self-pay | Admitting: Emergency Medicine

## 2016-09-25 ENCOUNTER — Inpatient Hospital Stay (HOSPITAL_COMMUNITY)
Admission: AD | Admit: 2016-09-25 | Discharge: 2016-09-25 | Disposition: A | Discharge status: Home / Self Care | Payer: Self-pay | Source: Ambulatory Visit | Attending: Family Medicine | Admitting: Family Medicine

## 2016-09-25 ENCOUNTER — Inpatient Hospital Stay (HOSPITAL_COMMUNITY): Payer: Self-pay

## 2016-09-25 ENCOUNTER — Encounter: Payer: Self-pay | Admitting: Student

## 2016-09-25 DIAGNOSIS — Z3201 Encounter for pregnancy test, result positive: Secondary | ICD-10-CM | POA: Insufficient documentation

## 2016-09-25 DIAGNOSIS — Z7982 Long term (current) use of aspirin: Secondary | ICD-10-CM | POA: Insufficient documentation

## 2016-09-25 DIAGNOSIS — O10919 Unspecified pre-existing hypertension complicating pregnancy, unspecified trimester: Secondary | ICD-10-CM

## 2016-09-25 DIAGNOSIS — Z9889 Other specified postprocedural states: Secondary | ICD-10-CM | POA: Insufficient documentation

## 2016-09-25 DIAGNOSIS — B9689 Other specified bacterial agents as the cause of diseases classified elsewhere: Secondary | ICD-10-CM | POA: Insufficient documentation

## 2016-09-25 DIAGNOSIS — D259 Leiomyoma of uterus, unspecified: Secondary | ICD-10-CM | POA: Insufficient documentation

## 2016-09-25 DIAGNOSIS — O26891 Other specified pregnancy related conditions, first trimester: Secondary | ICD-10-CM | POA: Insufficient documentation

## 2016-09-25 DIAGNOSIS — O23591 Infection of other part of genital tract in pregnancy, first trimester: Secondary | ICD-10-CM | POA: Insufficient documentation

## 2016-09-25 DIAGNOSIS — R109 Unspecified abdominal pain: Secondary | ICD-10-CM | POA: Insufficient documentation

## 2016-09-25 DIAGNOSIS — O219 Vomiting of pregnancy, unspecified: Secondary | ICD-10-CM | POA: Insufficient documentation

## 2016-09-25 DIAGNOSIS — O162 Unspecified maternal hypertension, second trimester: Secondary | ICD-10-CM | POA: Insufficient documentation

## 2016-09-25 DIAGNOSIS — Z3A01 Less than 8 weeks gestation of pregnancy: Secondary | ICD-10-CM | POA: Insufficient documentation

## 2016-09-25 DIAGNOSIS — N760 Acute vaginitis: Secondary | ICD-10-CM | POA: Insufficient documentation

## 2016-09-25 DIAGNOSIS — F17200 Nicotine dependence, unspecified, uncomplicated: Secondary | ICD-10-CM | POA: Insufficient documentation

## 2016-09-25 DIAGNOSIS — O3411 Maternal care for benign tumor of corpus uteri, first trimester: Secondary | ICD-10-CM | POA: Insufficient documentation

## 2016-09-25 DIAGNOSIS — O99331 Smoking (tobacco) complicating pregnancy, first trimester: Secondary | ICD-10-CM | POA: Insufficient documentation

## 2016-09-25 LAB — WET PREP, GENITAL
Sperm: NONE SEEN
Trich, Wet Prep: NONE SEEN
Yeast Wet Prep HPF POC: NONE SEEN

## 2016-09-25 LAB — URINALYSIS, ROUTINE W REFLEX MICROSCOPIC
BILIRUBIN URINE: NEGATIVE
Bacteria, UA: NONE SEEN
GLUCOSE, UA: NEGATIVE mg/dL
HGB URINE DIPSTICK: NEGATIVE
KETONES UR: NEGATIVE mg/dL
NITRITE: NEGATIVE
PROTEIN: NEGATIVE mg/dL
Specific Gravity, Urine: 1.029 (ref 1.005–1.030)
pH: 5 (ref 5.0–8.0)

## 2016-09-25 LAB — CBC
HCT: 35.5 % — ABNORMAL LOW (ref 36.0–46.0)
Hemoglobin: 11 g/dL — ABNORMAL LOW (ref 12.0–15.0)
MCH: 22.5 pg — AB (ref 26.0–34.0)
MCHC: 31 g/dL (ref 30.0–36.0)
MCV: 72.7 fL — ABNORMAL LOW (ref 78.0–100.0)
Platelets: 348 10*3/uL (ref 150–400)
RBC: 4.88 MIL/uL (ref 3.87–5.11)
RDW: 16.9 % — AB (ref 11.5–15.5)
WBC: 20.1 10*3/uL — ABNORMAL HIGH (ref 4.0–10.5)

## 2016-09-25 LAB — POCT PREGNANCY, URINE: Preg Test, Ur: POSITIVE — AB

## 2016-09-25 LAB — HCG, QUANTITATIVE, PREGNANCY: hCG, Beta Chain, Quant, S: 5932 m[IU]/mL — ABNORMAL HIGH (ref <5)

## 2016-09-25 MED ORDER — LABETALOL HCL 100 MG PO TABS
200.0000 mg | ORAL_TABLET | Freq: Once | ORAL | Status: AC
  Administered 2016-09-25: 200 mg via ORAL
  Filled 2016-09-25: qty 2

## 2016-09-25 MED ORDER — METRONIDAZOLE 500 MG PO TABS
500.0000 mg | ORAL_TABLET | Freq: Two times a day (BID) | ORAL | 0 refills | Status: AC
Start: 2016-09-25 — End: 2016-10-02

## 2016-09-25 MED ORDER — DOXYLAMINE-PYRIDOXINE 10-10 MG PO TBEC: 2.0000 | DELAYED_RELEASE_TABLET | Freq: Every evening | ORAL | 2 refills | Status: DC | PRN

## 2016-09-25 MED ORDER — PROMETHAZINE HCL 25 MG PO TABS
25.0000 mg | ORAL_TABLET | Freq: Once | ORAL | Status: AC
  Administered 2016-09-25: 25 mg via ORAL
  Filled 2016-09-25: qty 1

## 2016-09-25 MED ORDER — LABETALOL HCL 200 MG PO TABS: 200.0000 mg | ORAL_TABLET | Freq: Two times a day (BID) | ORAL | 0 refills | Status: DC

## 2016-09-25 NOTE — MAU Provider Note (Signed)
Chief Complaint: Abdominal Cramping   First Provider Initiated Contact with Patient 09/25/16 2235        SUBJECTIVE HPI: Jenna Stephens is a 41 y.o. [id number] at Unknown by LMP who presents to maternity admissions reporting lower abdominal cramping for the past 3-4 days.  Also has nausea and vomiting.  Does have a history of chronic hypertension but stopped taking her meds a while back. She denies vaginal bleeding, vaginal itching/burning, urinary symptoms, h/a, dizziness, or fever/chills.    Didn't think she could get pregnant. Last pregnancy was 3 years ago, hasn't used contraception since.  Abdominal Cramping  This is a new problem. The current episode started in the past 7 days. The onset quality is gradual. The problem occurs intermittently. The problem has been unchanged. The pain is located in the LLQ, RLQ and suprapubic region. The quality of the pain is cramping. The abdominal pain does not radiate. Pertinent negatives include no anorexia, constipation, diarrhea, dysuria, fever, frequency, headaches, myalgias, nausea or vomiting. Nothing aggravates the pain. The pain is relieved by nothing. She has tried nothing for the symptoms.  Emesis   This is a new problem. The current episode started in the past 7 days. The problem occurs less than 2 times per day. The problem has been unchanged. There has been no fever. Associated symptoms include abdominal pain. Pertinent negatives include no chills, diarrhea, dizziness, fever, headaches or myalgias.    RN Note: Pt presents to MAU with complaints of lower abdominal cramping since Saturday. Last normal cycle was April the 5th. Nausea and vomiting  Past Medical History:  Diagnosis Date  . Hypertension    Past Surgical History:  Procedure Laterality Date  . TONSILLECTOMY     Social History   Social History  . Marital status: Single    Spouse name: N/A  . Number of children: N/A  . Years of education: N/A   Occupational History  .  Not on file.   Social History Main Topics  . Smoking status: Current Some Day Smoker    Types: Cigarettes  . Smokeless tobacco: Never Used  . Alcohol use No     Comment: weekends only  . Drug use: Yes    Types: Marijuana     Comment: last use September 24 2016  . Sexual activity: Yes    Birth control/ protection: None   Other Topics Concern  . Not on file   Social History Narrative  . No narrative on file   No current facility-administered medications on file prior to encounter.    Current Outpatient Prescriptions on File Prior to Encounter  Medication Sig Dispense Refill  . Aspirin-Salicylamide-Caffeine (BC FAST PAIN RELIEF) [phone] MG PACK Take 1 packet by mouth daily as needed (for headache).    . ondansetron (ZOFRAN) 4 MG tablet Take 1 tablet (4 mg total) by mouth every 6 (six) hours. (Patient not taking: Reported on 05/22/2015) 12 tablet 0  . promethazine (PHENERGAN) 25 MG tablet Take 1 tablet (25 mg total) by mouth every 6 (six) hours as needed for nausea or vomiting. 10 tablet 0   No Known Allergies  I have reviewed patient's Past Medical Hx, Surgical Hx, Family Hx, Social Hx, medications and allergies.   ROS:  Review of Systems  Constitutional: Negative for chills and fever.  Gastrointestinal: Positive for abdominal pain. Negative for anorexia, constipation, diarrhea, nausea and vomiting.  Genitourinary: Negative for dysuria and frequency.  Musculoskeletal: Negative for myalgias.  Neurological: Negative for dizziness and headaches.  Review of Systems  Other systems negative   Physical Exam  Physical Exam Patient Vitals for the past 24 hrs:  BP Temp Pulse Resp Weight  09/25/16 2208 (!) 148/98 - 96 18 -  09/25/16 1842 (!) 168/105 98.6 F (37 C) (!) 124 18 276 lb (125.2 kg)   Constitutional: Well-developed, well-nourished female in no acute distress.  Cardiovascular: normal rate Respiratory: normal effort GI: Abd soft, non-tender. Pos BS x 4 MS: Extremities  nontender, no edema, normal ROM Neurologic: Alert and oriented x 4.  GU: Neg CVAT.  PELVIC EXAM: Cervix pink, visually closed, without lesion, scant white creamy discharge, vaginal walls and external genitalia normal Bimanual exam: Cervix 0/long/high, firm, anterior, neg CMT, uterus nontender, nonenlarged, adnexa without tenderness, enlargement, or mass   LAB RESULTS Results for orders placed or performed during the hospital encounter of 09/25/16 (from the past 24 hour(s))  Urinalysis, Routine w reflex microscopic     Status: Abnormal   Collection Time: 09/25/16  6:41 PM  Result Value Ref Range   Color, Urine YELLOW YELLOW   APPearance CLEAR CLEAR   Specific Gravity, Urine 1.029 1.005 - 1.030   pH 5.0 5.0 - 8.0   Glucose, UA NEGATIVE NEGATIVE mg/dL   Hgb urine dipstick NEGATIVE NEGATIVE   Bilirubin Urine NEGATIVE NEGATIVE   Ketones, ur NEGATIVE NEGATIVE mg/dL   Protein, ur NEGATIVE NEGATIVE mg/dL   Nitrite NEGATIVE NEGATIVE   Leukocytes, UA TRACE (A) NEGATIVE   RBC / HPF 0-5 0 - 5 RBC/hpf   WBC, UA 0-5 0 - 5 WBC/hpf   Bacteria, UA NONE SEEN NONE SEEN   Squamous Epithelial / LPF 0-5 (A) NONE SEEN   Mucous PRESENT   Pregnancy, urine POC     Status: Abnormal   Collection Time: 09/25/16  7:03 PM  Result Value Ref Range   Preg Test, Ur POSITIVE (A) NEGATIVE  CBC     Status: Abnormal   Collection Time: 09/25/16  9:06 PM  Result Value Ref Range   WBC 20.1 (H) 4.0 - 10.5 K/uL   RBC 4.88 3.87 - 5.11 MIL/uL   Hemoglobin 11.0 (L) 12.0 - 15.0 g/dL   HCT 35.5 (L) 36.0 - 46.0 %   MCV 72.7 (L) 78.0 - 100.0 fL   MCH 22.5 (L) 26.0 - 34.0 pg   MCHC 31.0 30.0 - 36.0 g/dL   RDW 16.9 (H) 11.5 - 15.5 %   Platelets 348 150 - 400 K/uL  hCG, quantitative, pregnancy     Status: Abnormal   Collection Time: 09/25/16  9:06 PM  Result Value Ref Range   hCG, Beta Chain, Quant, S 5,932 (H) <5 mIU/mL       IMAGING US Ob Comp Less 14 Wks  Result Date: 09/25/2016 CLINICAL DATA:  Cramping for 3  days EXAM: OBSTETRIC <14 WK [place] AND TRANSVAGINAL OB US TECHNIQUE: Both transabdominal and transvaginal ultrasound examinations were performed for complete evaluation of the gestation as well as the maternal uterus, adnexal regions, and pelvic cul-de-sac. Transvaginal technique was performed to assess early pregnancy. COMPARISON:  None. FINDINGS: Intrauterine gestational sac: Single intrauterine gestational sac Yolk sac:  Visualized Embryo:  Not visualized MSD: 9.9   mm   5 w   5  d Subchorionic hemorrhage:  None visualized. Maternal uterus/adnexae: Small myometrial masses ; anterior mass measures 1.1 x 1.1 x 1 cm. Mid posterior mass measures 0.5 cm. Ovaries are within normal limits. The ovary measures 3.7 x 1.3 x 1.4 cm. The right ovary  measures 4.9 x 2.1 by 2.6 cm. No free fluid. IMPRESSION: 1. Single intrauterine gestational sac and yolk sac visualized. Embryo not yet visualized. Consider follow-up ultrasound in 14 days to document viability. 2. Small uterine fibroids Electronically Signed   By: Donavan Foil M.D.   On: 09/25/2016 21:49   US Ob Transvaginal  Result Date: 09/25/2016 CLINICAL DATA:  Cramping for 3 days EXAM: OBSTETRIC <14 WK [place] AND TRANSVAGINAL OB US TECHNIQUE: Both transabdominal and transvaginal ultrasound examinations were performed for complete evaluation of the gestation as well as the maternal uterus, adnexal regions, and pelvic cul-de-sac. Transvaginal technique was performed to assess early pregnancy. COMPARISON:  None. FINDINGS: Intrauterine gestational sac: Single intrauterine gestational sac Yolk sac:  Visualized Embryo:  Not visualized MSD: 9.9   mm   5 w   5  d Subchorionic hemorrhage:  None visualized. Maternal uterus/adnexae: Small myometrial masses ; anterior mass measures 1.1 x 1.1 x 1 cm. Mid posterior mass measures 0.5 cm. Ovaries are within normal limits. The ovary measures 3.7 x 1.3 x 1.4 cm. The right ovary measures 4.9 x 2.1 by 2.6 cm. No free fluid. IMPRESSION: 1. Single  intrauterine gestational sac and yolk sac visualized. Embryo not yet visualized. Consider follow-up ultrasound in 14 days to document viability. 2. Small uterine fibroids Electronically Signed   By: Donavan Foil M.D.   On: 09/25/2016 21:49    MAU Management/MDM: Ordered usual first trimester r/o ectopic labs.   Pelvic exam and cultures done Will check baseline Ultrasound to rule out ectopic.  This bleeding/pain can represent a normal pregnancy with bleeding, spontaneous abortion or even an ectopic which can be life-threatening.  The process as listed above helps to determine which of these is present.  Phenergan given for nausea with good relief  Labetalol given for severe range hypertension, with consult Dr Nehemiah Settle  WIll send her home on low dose.Marland Kitchen    Discussed US findings of Gestational sac with yolk sac.  Effectively rules out ectopic pregnancy.  ASSESSMENT 1. Abdominal pain in pregnancy, first trimester   2. Abdominal pain in pregnancy, first trimester   3.    Pregnancy at 5+ weeks 4.     Nausea and vomiting 5.      Bacterial vaginosis 6.     Chronic hypertension, uncontrolled (some severe range)  PLAN Discharge home Rx Diclegis for nausea Rx Labetalol 200mg  bid for hypertension Rx Metronidazole for BV States may want to go back to [facility] for [facility] care  Pt stable at time of discharge. Encouraged to return here or to other Urgent Care/ED if she develops worsening of symptoms, increase in pain, fever, or other concerning symptoms.    Hansel Feinstein CNM, MSN Certified Nurse-Midwife 09/25/2016  10:35 PM

## 2016-09-25 NOTE — MAU Note (Signed)
Pt presents to MAU with complaints of lower abdominal cramping since Saturday. Last normal cycle was April the 5th. Nausea and vomiting

## 2016-09-25 NOTE — Discharge Instructions (Signed)
Morning Sickness °Morning sickness is when you feel sick to your stomach (nauseous) during pregnancy. This nauseous feeling may or may not come with vomiting. It often occurs in the morning but can be a problem any time of day. Morning sickness is most common during the first trimester, but it may continue throughout pregnancy. While morning sickness is unpleasant, it is usually harmless unless you develop severe and continual vomiting (hyperemesis gravidarum). This condition requires more intense treatment. °What are the causes? °The cause of morning sickness is not completely known but seems to be related to normal hormonal changes that occur in pregnancy. °What increases the risk? °You are at greater risk if you: °· Experienced nausea or vomiting before your pregnancy. °· Had morning sickness during a previous pregnancy. °· Are pregnant with more than one baby, such as twins. ° °How is this treated? °Do not use any medicines (prescription, over-the-counter, or herbal) for morning sickness without first talking to your health care provider. Your health care provider may prescribe or recommend: °· Vitamin B6 supplements. °· Anti-nausea medicines. °· The herbal medicine ginger. ° °Follow these instructions at home: °· Only take over-the-counter or prescription medicines as directed by your health care provider. °· Taking multivitamins before getting pregnant can prevent or decrease the severity of morning sickness in most women. °· Eat a piece of dry toast or unsalted crackers before getting out of bed in the morning. °· Eat five or six small meals a day. °· Eat dry and bland foods (rice, baked potato). Foods high in carbohydrates are often helpful. °· Do not drink liquids with your meals. Drink liquids between meals. °· Avoid greasy, fatty, and spicy foods. °· Get someone to cook for you if the smell of any food causes nausea and vomiting. °· If you feel nauseous after taking prenatal vitamins, take the vitamins at  night or with a snack. °· Snack on protein foods (nuts, yogurt, cheese) between meals if you are hungry. °· Eat unsweetened gelatins for desserts. °· Wearing an acupressure wristband (worn for sea sickness) may be helpful. °· Acupuncture may be helpful. °· Do not smoke. °· Get a humidifier to keep the air in your house free of odors. °· Get plenty of fresh air. °Contact a health care provider if: °· Your home remedies are not working, and you need medicine. °· You feel dizzy or lightheaded. °· You are losing weight. °Get help right away if: °· You have persistent and uncontrolled nausea and vomiting. °· You pass out (faint). °This information is not intended to replace advice given to you by your health care provider. Make sure you discuss any questions you have with your health care provider. °Document Released: 05/31/2006 Document Revised: 09/15/2015 Document Reviewed: 09/24/2012 °Elsevier Interactive Patient Education © 2017 [employer] ° °First Trimester of Pregnancy °The first trimester of pregnancy is from week 1 until the end of week 13 (months 1 through 3). A week after a sperm fertilizes an egg, the egg will implant on the wall of the uterus. This embryo will begin to develop into a baby. Genes from you and your partner will form the baby. The female genes will determine whether the baby will be a boy or a girl. At 6-8 weeks, the eyes and face will be formed, and the heartbeat can be seen on ultrasound. At the end of 12 weeks, all the baby's organs will be formed. °Now that you are pregnant, you will want to do everything you can to have   a healthy baby. Two of the most important things are to get good prenatal care and to follow your health care provider's instructions. Prenatal care is all the medical care you receive before the baby's birth. This care will help prevent, find, and treat any problems during the pregnancy and childbirth. °Body changes during your first trimester °Your body goes through many  changes during pregnancy. The changes vary from woman to woman. °· You may gain or lose a couple of pounds at first. °· You may feel sick to your stomach (nauseous) and you may throw up (vomit). If the vomiting is uncontrollable, call your health care provider. °· You may tire easily. °· You may develop headaches that can be relieved by medicines. All medicines should be approved by your health care provider. °· You may urinate more often. Painful urination may mean you have a bladder infection. °· You may develop heartburn as a result of your pregnancy. °· You may develop constipation because certain hormones are causing the muscles that push stool through your intestines to slow down. °· You may develop hemorrhoids or swollen veins (varicose veins). °· Your breasts may begin to grow larger and become tender. Your nipples may stick out more, and the tissue that surrounds them (areola) may become darker. °· Your gums may bleed and may be sensitive to brushing and flossing. °· Dark spots or blotches (chloasma, mask of pregnancy) may develop on your face. This will likely fade after the baby is born. °· Your menstrual periods will stop. °· You may have a loss of appetite. °· You may develop cravings for certain kinds of food. °· You may have changes in your emotions from day to day, such as being excited to be pregnant or being concerned that something may go wrong with the pregnancy and baby. °· You may have more vivid and strange dreams. °· You may have changes in your hair. These can include thickening of your hair, rapid growth, and changes in texture. Some women also have hair loss during or after pregnancy, or hair that feels dry or thin. Your hair will most likely return to normal after your baby is born. ° °What to expect at prenatal visits °During a routine prenatal visit: °· You will be weighed to make sure you and the baby are growing normally. °· Your blood pressure will be taken. °· Your abdomen will be  measured to track your baby's growth. °· The fetal heartbeat will be listened to between weeks 10 and 14 of your pregnancy. °· Test results from any previous visits will be discussed. ° °Your health care provider may ask you: °· How you are feeling. °· If you are feeling the baby move. °· If you have had any abnormal symptoms, such as leaking fluid, bleeding, severe headaches, or abdominal cramping. °· If you are using any tobacco products, including cigarettes, chewing tobacco, and electronic cigarettes. °· If you have any questions. ° °Other tests that may be performed during your first trimester include: °· Blood tests to find your blood type and to check for the presence of any previous infections. The tests will also be used to check for low iron levels (anemia) and protein on red blood cells (Rh antibodies). Depending on your risk factors, or if you previously had diabetes during pregnancy, you may have tests to check for high blood sugar that affects pregnant women (gestational diabetes). °· Urine tests to check for infections, diabetes, or protein in the urine. °· An ultrasound   to confirm the proper growth and development of the baby. °· Fetal screens for spinal cord problems (spina bifida) and Down syndrome. °· HIV (human immunodeficiency virus) testing. Routine prenatal testing includes screening for HIV, unless you choose not to have this test. °· You may need other tests to make sure you and the baby are doing well. ° °Follow these instructions at home: °Medicines °· Follow your health care provider's instructions regarding medicine use. Specific medicines may be either safe or unsafe to take during pregnancy. °· Take a prenatal vitamin that contains at least 600 micrograms (mcg) of folic acid. °· If you develop constipation, try taking a stool softener if your health care provider approves. °Eating and drinking °· Eat a balanced diet that includes fresh fruits and vegetables, whole grains, good sources  of protein such as meat, eggs, or tofu, and low-fat dairy. Your health care provider will help you determine the amount of weight gain that is right for you. °· Avoid raw meat and uncooked cheese. These carry germs that can cause birth defects in the baby. °· Eating four or five small meals rather than three large meals a day may help relieve nausea and vomiting. If you start to feel nauseous, eating a few soda crackers can be helpful. Drinking liquids between meals, instead of during meals, also seems to help ease nausea and vomiting. °· Limit foods that are high in fat and processed sugars, such as fried and sweet foods. °· To prevent constipation: °? Eat foods that are high in fiber, such as fresh fruits and vegetables, whole grains, and beans. °? Drink enough fluid to keep your urine clear or pale yellow. °Activity °· Exercise only as directed by your health care provider. Most women can continue their usual exercise routine during pregnancy. Try to exercise for 30 minutes at least 5 days a week. Exercising will help you: °? Control your weight. °? Stay in shape. °? Be prepared for labor and delivery. °· Experiencing pain or cramping in the lower abdomen or lower back is a good sign that you should stop exercising. Check with your health care provider before continuing with normal exercises. °· Try to avoid standing for long periods of time. Move your legs often if you must stand in one place for a long time. °· Avoid heavy lifting. °· Wear low-heeled shoes and practice good posture. °· You may continue to have sex unless your health care provider tells you not to. °Relieving pain and discomfort °· Wear a good support bra to relieve breast tenderness. °· Take warm sitz baths to soothe any pain or discomfort caused by hemorrhoids. Use hemorrhoid cream if your health care provider approves. °· Rest with your legs elevated if you have leg cramps or low back pain. °· If you develop varicose veins in your legs, wear  support hose. Elevate your feet for 15 minutes, 3-4 times a day. Limit salt in your diet. °Prenatal care °· Schedule your prenatal visits by the twelfth week of pregnancy. They are usually scheduled monthly at first, then more often in the last 2 months before delivery. °· Write down your questions. Take them to your prenatal visits. °· Keep all your prenatal visits as told by your health care provider. This is important. °Safety °· Wear your seat belt at all times when driving. °· Make a list of emergency phone numbers, including numbers for family, friends, the hospital, and police and fire departments. °General instructions °· Ask your health care provider for   a referral to a local prenatal education class. Begin classes no later than the beginning of month 6 of your pregnancy. °· Ask for help if you have counseling or nutritional needs during pregnancy. Your health care provider can offer advice or refer you to specialists for help with various needs. °· Do not use hot tubs, steam rooms, or saunas. °· Do not douche or use tampons or scented sanitary pads. °· Do not cross your legs for long periods of time. °· Avoid cat litter boxes and soil used by cats. These carry germs that can cause birth defects in the baby and possibly loss of the fetus by miscarriage or stillbirth. °· Avoid all smoking, herbs, alcohol, and medicines not prescribed by your health care provider. Chemicals in these products affect the formation and growth of the baby. °· Do not use any products that contain nicotine or tobacco, such as cigarettes and e-cigarettes. If you need help quitting, ask your health care provider. You may receive counseling support and other resources to help you quit. °· Schedule a dentist appointment. At home, brush your teeth with a soft toothbrush and be gentle when you floss. °Contact a health care provider if: °· You have dizziness. °· You have mild pelvic cramps, pelvic pressure, or nagging pain in the  abdominal area. °· You have persistent nausea, vomiting, or diarrhea. °· You have a bad smelling vaginal discharge. °· You have pain when you urinate. °· You notice increased swelling in your face, hands, legs, or ankles. °· You are exposed to fifth disease or chickenpox. °· You are exposed to German measles (rubella) and have never had it. °Get help right away if: °· You have a fever. °· You are leaking fluid from your vagina. °· You have spotting or bleeding from your vagina. °· You have severe abdominal cramping or pain. °· You have rapid weight gain or loss. °· You vomit blood or material that looks like coffee grounds. °· You develop a severe headache. °· You have shortness of breath. °· You have any kind of trauma, such as from a fall or a car accident. °Summary °· The first trimester of pregnancy is from week 1 until the end of week 13 (months 1 through 3). °· Your body goes through many changes during pregnancy. The changes vary from woman to woman. °· You will have routine prenatal visits. During those visits, your health care provider will examine you, discuss any test results you may have, and talk with you about how you are feeling. °This information is not intended to replace advice given to you by your health care provider. Make sure you discuss any questions you have with your health care provider. °Document Released: 04/03/2001 Document Revised: 03/21/2016 Document Reviewed: 03/21/2016 °Elsevier Interactive Patient Education © 2017 [employer]. ° °

## 2016-09-27 LAB — GC/CHLAMYDIA PROBE AMP (~~LOC~~) NOT AT ARMC
Chlamydia: NEGATIVE
NEISSERIA GONORRHEA: NEGATIVE

## 2016-10-10 ENCOUNTER — Encounter: Payer: Self-pay | Admitting: Emergency Medicine

## 2016-10-10 ENCOUNTER — Emergency Department (HOSPITAL_COMMUNITY): Payer: Self-pay

## 2016-10-10 ENCOUNTER — Emergency Department (HOSPITAL_COMMUNITY)
Admission: EM | Admit: 2016-10-10 | Discharge: 2016-10-10 | Disposition: A | Discharge status: Home / Self Care | Payer: Self-pay | Attending: Emergency Medicine | Admitting: Emergency Medicine

## 2016-10-10 DIAGNOSIS — R42 Dizziness and giddiness: Secondary | ICD-10-CM | POA: Insufficient documentation

## 2016-10-10 DIAGNOSIS — I10 Essential (primary) hypertension: Secondary | ICD-10-CM | POA: Insufficient documentation

## 2016-10-10 DIAGNOSIS — R531 Weakness: Secondary | ICD-10-CM | POA: Insufficient documentation

## 2016-10-10 DIAGNOSIS — N9489 Other specified conditions associated with female genital organs and menstrual cycle: Secondary | ICD-10-CM | POA: Insufficient documentation

## 2016-10-10 DIAGNOSIS — F17210 Nicotine dependence, cigarettes, uncomplicated: Secondary | ICD-10-CM | POA: Insufficient documentation

## 2016-10-10 LAB — CBC WITH DIFFERENTIAL/PLATELET
Basophils Absolute: 0 10*3/uL (ref 0.0–0.1)
Basophils Relative: 0 %
EOS PCT: 1 %
Eosinophils Absolute: 0.1 10*3/uL (ref 0.0–0.7)
HCT: 34.4 % — ABNORMAL LOW (ref 36.0–46.0)
Hemoglobin: 10.7 g/dL — ABNORMAL LOW (ref 12.0–15.0)
LYMPHS PCT: 31 %
Lymphs Abs: 3.4 10*3/uL (ref 0.7–4.0)
MCH: 22.3 pg — ABNORMAL LOW (ref 26.0–34.0)
MCHC: 31.1 g/dL (ref 30.0–36.0)
MCV: 71.8 fL — AB (ref 78.0–100.0)
MONO ABS: 0.7 10*3/uL (ref 0.1–1.0)
MONOS PCT: 7 %
Neutro Abs: 6.7 10*3/uL (ref 1.7–7.7)
Neutrophils Relative %: 61 %
PLATELETS: 375 10*3/uL (ref 150–400)
RBC: 4.79 MIL/uL (ref 3.87–5.11)
RDW: 15.6 % — AB (ref 11.5–15.5)
WBC: 11 10*3/uL — ABNORMAL HIGH (ref 4.0–10.5)

## 2016-10-10 LAB — BASIC METABOLIC PANEL
Anion gap: 9 (ref 5–15)
BUN: 11 mg/dL (ref 6–20)
CALCIUM: 8.6 mg/dL — AB (ref 8.9–10.3)
CO2: 25 mmol/L (ref 22–32)
Chloride: 105 mmol/L (ref 101–111)
Creatinine, Ser: 0.75 mg/dL (ref 0.44–1.00)
GFR calc Af Amer: >60 mL/min (ref >60)
GFR calc non Af Amer: >60 mL/min (ref >60)
GLUCOSE: 87 mg/dL (ref 65–99)
POTASSIUM: 3.5 mmol/L (ref 3.5–5.1)
Sodium: 139 mmol/L (ref 135–145)

## 2016-10-10 LAB — I-STAT TROPONIN, ED: Troponin i, poc: 0 ng/mL (ref 0.00–0.08)

## 2016-10-10 LAB — HCG, QUANTITATIVE, PREGNANCY: HCG, BETA CHAIN, QUANT, S: 148 m[IU]/mL — AB (ref <5)

## 2016-10-10 NOTE — ED Provider Notes (Signed)
[facility] DEPT Provider Note   CSN: [id number] Arrival date & time: 10/10/16  1206     History   Chief Complaint Chief Complaint  Patient presents with  . Dizziness    HPI Jenna Stephens is a 41 y.o. female.  The history is provided by the patient and medical records.  Dizziness  Associated symptoms: weakness (generalized)    41 year old female with history of hypertension, presenting to the ED with lightheadedness and generalized weakness.  Patient reports she found out on 09/25/2016 that she was about [redacted]w[redacted]d pregnant. She was having some dull abdominal pain at that time and was seen at [facility]. States at that time she did have an ultrasound with a fetal sac, but they did not embryo. States about 2 days later she started having heavy vaginal bleeding with lots of clots. She assumed this was a miscarriage. States this lasted for almost 12 days, it stopped yesterday. States she was going through at least one heavy maxi pad an hour for several days. States bleeding eventually did lighten. States for the past 4 days or so she's been feeling lightheaded and generally weak. States she's been eating and drinking normally, somewhat less than usual as her appetite is poor. She's not had any abdominal pain, fever, chills, or sweats. She's not had any urinary symptoms or vaginal discharge.  States she has been sleeping well at night, but still wakes up exhausted.  She has not had any syncopal events or feelings of syncope.  VSS-- BP is mildly elevated, states she was prescribed new meds recently but they were too expensive.  Past Medical History:  Diagnosis Date  . Hypertension     There are no active problems to display for this patient.   Past Surgical History:  Procedure Laterality Date  . TONSILLECTOMY      OB History    Gravida Para Term Preterm AB Living   5 2 2   2 2    SAB TAB Ectopic Multiple Live Births   2               Home Medications    Prior to Admission  medications   Medication Sig Start Date End Date Taking? Authorizing Provider  Doxylamine-Pyridoxine 10-10 MG TBEC Take 2 tablets by mouth at bedtime as needed. 09/25/16  Yes Seabron Spates, CNM  labetalol (NORMODYNE) 200 MG tablet Take 1 tablet (200 mg total) by mouth every 12 (twelve) hours. 09/25/16  Yes Seabron Spates, CNM  promethazine (PHENERGAN) 25 MG tablet Take 1 tablet (25 mg total) by mouth every 6 (six) hours as needed for nausea or vomiting. Patient not taking: Reported on 10/10/2016 05/22/15   Quintella Reichert, MD    Family History Family History  Problem Relation Age of Onset  . Diabetes Mother   . Hypertension Mother     Social History Social History  Substance Use Topics  . Smoking status: Current Some Day Smoker    Types: Cigarettes  . Smokeless tobacco: Never Used  . Alcohol use No     Comment: weekends only     Allergies   Patient has no known allergies.   Review of Systems Review of Systems  Neurological: Positive for weakness (generalized) and light-headedness.  All other systems reviewed and are negative.    Physical Exam Updated Vital Signs BP (!) 157/110 Comment: Recently diagnosed with HTN, prescribed medication that was too expensive.  Pulse 80   Temp 97.8 F (36.6 C)   Resp 16  LMP 08/09/2016 (Exact Date)   SpO2 100%   Breastfeeding? Unknown   Physical Exam  Constitutional: She is oriented to person, place, and time. She appears well-developed and well-nourished.  Obese, NAD  HENT:  Head: Normocephalic and atraumatic.  Mouth/Throat: Oropharynx is clear and moist.  Eyes: Conjunctivae and EOM are normal. Pupils are equal, round, and reactive to light.  Neck: Normal range of motion.  Cardiovascular: Normal rate, regular rhythm and normal heart sounds.   Pulmonary/Chest: Effort normal and breath sounds normal.  Abdominal: Soft. Bowel sounds are normal.  Musculoskeletal: Normal range of motion.  Neurological: She is alert and oriented  to person, place, and time.  AAOx3, answering questions and following commands appropriately; equal strength UE and LE bilaterally; CN grossly intact; moves all extremities appropriately without ataxia; no focal neuro deficits or facial asymmetry appreciated  Skin: Skin is warm and dry.  Psychiatric: She has a normal mood and affect.  Nursing note and vitals reviewed.    ED Treatments / Results  Labs (all labs ordered are listed, but only abnormal results are displayed) Labs Reviewed  BASIC METABOLIC PANEL - Abnormal; Notable for the following:       Result Value   Calcium 8.6 (*)    All other components within normal limits  CBC WITH DIFFERENTIAL/PLATELET - Abnormal; Notable for the following:    WBC 11.0 (*)    Hemoglobin 10.7 (*)    HCT 34.4 (*)    MCV 71.8 (*)    MCH 22.3 (*)    RDW 15.6 (*)    All other components within normal limits  HCG, QUANTITATIVE, PREGNANCY - Abnormal; Notable for the following:    hCG, Beta Chain, Quant, S 148 (*)    All other components within normal limits  I-STAT TROPOININ, ED    EKG  EKG Interpretation None       Radiology Dg Chest 2 View  Result Date: 10/10/2016 CLINICAL DATA:  Dizziness, weakness, lightheadedness, shortness of breath EXAM: CHEST  2 VIEW COMPARISON:  03/28/2010 FINDINGS: The heart size and mediastinal contours are within normal limits. Both lungs are clear. The visualized skeletal structures are unremarkable. IMPRESSION: No active cardiopulmonary disease. Electronically Signed   By: Kathreen Devoid   On: 10/10/2016 14:03    Procedures Procedures (including critical care time)  Medications Ordered in ED Medications - No data to display   Initial Impression / Assessment and Plan / ED Course  I have reviewed the triage vital signs and the nursing notes.  Pertinent labs & imaging results that were available during my care of the patient were reviewed by me and considered in my medical decision making (see chart for  details).  41 year old female here with lightheadedness and some generalized weakness over the past several days. She unfortunately had a recent miscarriage with heavy bleeding for almost 12 days now, bleeding stopped yesterday. She has not had any abdominal pain, urinary symptoms, or abnormal vaginal discharge. No fever or chills. States she just feels fatigued.  She is afebrile and nontoxic. Vital signs are stable. Neurologic exam is nonfocal. Labs obtained, hemoglobin 10.7 (was 11.0 on 09/25/16).  WBC count stable at 11.  Normal electrolytes and kidney function. Troponin I chest x-ray negative.  Her hCG is trending down to 148, was 5,000+ on 09/25/16).  I suspect her lightheadedness and weakness secondary to some minor blood loss and recent miscarriage. Given her hemoglobin remained stable today, do not feel she requires admission for transfusion. She is not  having any abdominal pain, fever, pelvic pain, or vaginal discharge and does not appear to have any infectious symptoms suggestive of retained products of conception at this time. Discussed with patient that she should have close follow-up next week for repeat hcg to ensure this is returning to normal. She understands to follow-up here or [facility] sooner should develop any fever, abdominal pain, pelvic pain, or other GYN symptoms.  Discussed plan with patient, she acknowledged understanding and agreed with plan of care.  Of note, patient's BP elevated here, 161/96 at time of discharge.  Reports she was prescribed new BP meds 2 weeks ago but they were too expensive so she did not fill them.  She denies chest pain, SOB, or headache.  No signs/symptosm of end organ damage here.  Will have her follow-up with her PCP about this.  Final Clinical Impressions(s) / ED Diagnoses   Final diagnoses:  Weakness  Lightheadedness    New Prescriptions Discharge Medication List as of 10/10/2016  2:53 PM       Larene Pickett, PA-C 10/10/16 [place], [place], DO 10/10/16 1603

## 2016-10-10 NOTE — Discharge Instructions (Signed)
As we discussed, your hemoglobin level did drop down a little, but not to the point where he needs transfusion. With your beta hCG is also trending down, they should return to normal in the next few days. I recommend that you follow-up with GYN within the next week for repeat hCG. You should follow-up sooner for any abdominal pain, pelvic pain, high fever, vaginal discharge, or other GYN symptoms.

## 2016-10-10 NOTE — ED Triage Notes (Signed)
Pt c/o dizziness, bilateral symmetrical weakness, lightheadedness, SOB, no appetite x 5 days.   Recent miscarriage, heavy vaginal bleeding with clots started 09/28/2016 ended yesterday. Saturated 1 overnight maxi pad per hour. Was about [redacted] weeks pregnant at onset.

## 2017-11-28 ENCOUNTER — Emergency Department (HOSPITAL_COMMUNITY): Payer: No Typology Code available for payment source

## 2017-11-28 ENCOUNTER — Emergency Department (HOSPITAL_COMMUNITY)
Admission: EM | Admit: 2017-11-28 | Discharge: 2017-11-28 | Disposition: A | Discharge status: Home / Self Care | Payer: No Typology Code available for payment source | Attending: Emergency Medicine | Admitting: Emergency Medicine

## 2017-11-28 ENCOUNTER — Encounter (HOSPITAL_COMMUNITY): Payer: Self-pay | Admitting: Radiology

## 2017-11-28 DIAGNOSIS — Z23 Encounter for immunization: Secondary | ICD-10-CM | POA: Insufficient documentation

## 2017-11-28 DIAGNOSIS — M79671 Pain in right foot: Secondary | ICD-10-CM | POA: Diagnosis not present

## 2017-11-28 DIAGNOSIS — R079 Chest pain, unspecified: Secondary | ICD-10-CM | POA: Diagnosis present

## 2017-11-28 DIAGNOSIS — M545 Low back pain: Secondary | ICD-10-CM | POA: Diagnosis not present

## 2017-11-28 DIAGNOSIS — Y939 Activity, unspecified: Secondary | ICD-10-CM | POA: Diagnosis not present

## 2017-11-28 DIAGNOSIS — Y999 Unspecified external cause status: Secondary | ICD-10-CM | POA: Insufficient documentation

## 2017-11-28 DIAGNOSIS — M549 Dorsalgia, unspecified: Secondary | ICD-10-CM

## 2017-11-28 LAB — I-STAT CG4 LACTIC ACID, ED: Lactic Acid, Venous: 3.28 mmol/L (ref 0.5–1.9)

## 2017-11-28 LAB — URINALYSIS, ROUTINE W REFLEX MICROSCOPIC
Bilirubin Urine: NEGATIVE
GLUCOSE, UA: NEGATIVE mg/dL
Ketones, ur: NEGATIVE mg/dL
Leukocytes, UA: NEGATIVE
Nitrite: NEGATIVE
PH: 7 (ref 5.0–8.0)
Protein, ur: 30 mg/dL — AB
SPECIFIC GRAVITY, URINE: 1.045 — AB (ref 1.005–1.030)

## 2017-11-28 LAB — CBC
HEMATOCRIT: 36.5 % (ref 36.0–46.0)
Hemoglobin: 10.5 g/dL — ABNORMAL LOW (ref 12.0–15.0)
MCH: 20 pg — ABNORMAL LOW (ref 26.0–34.0)
MCHC: 28.8 g/dL — AB (ref 30.0–36.0)
MCV: 69.5 fL — AB (ref 78.0–100.0)
PLATELETS: 358 10*3/uL (ref 150–400)
RBC: 5.25 MIL/uL — AB (ref 3.87–5.11)
RDW: 16.1 % — AB (ref 11.5–15.5)
WBC: 17.6 10*3/uL — AB (ref 4.0–10.5)

## 2017-11-28 LAB — COMPREHENSIVE METABOLIC PANEL
ALT: 16 U/L (ref 0–44)
AST: 19 U/L (ref 15–41)
Albumin: 3.7 g/dL (ref 3.5–5.0)
Alkaline Phosphatase: 59 U/L (ref 38–126)
Anion gap: 12 (ref 5–15)
BUN: 11 mg/dL (ref 6–20)
CO2: 21 mmol/L — AB (ref 22–32)
Calcium: 9.1 mg/dL (ref 8.9–10.3)
Chloride: 106 mmol/L (ref 98–111)
Creatinine, Ser: 0.99 mg/dL (ref 0.44–1.00)
Glucose, Bld: 120 mg/dL — ABNORMAL HIGH (ref 70–99)
POTASSIUM: 3.4 mmol/L — AB (ref 3.5–5.1)
SODIUM: 139 mmol/L (ref 135–145)
Total Bilirubin: 0.8 mg/dL (ref 0.3–1.2)
Total Protein: 7.3 g/dL (ref 6.5–8.1)

## 2017-11-28 LAB — I-STAT CHEM 8, ED
BUN: 14 mg/dL (ref 6–20)
CREATININE: 0.8 mg/dL (ref 0.44–1.00)
Calcium, Ion: 1.14 mmol/L — ABNORMAL LOW (ref 1.15–1.40)
Chloride: 106 mmol/L (ref 98–111)
Glucose, Bld: 121 mg/dL — ABNORMAL HIGH (ref 70–99)
HEMATOCRIT: 37 % (ref 36.0–46.0)
Hemoglobin: 12.6 g/dL (ref 12.0–15.0)
POTASSIUM: 3.6 mmol/L (ref 3.5–5.1)
SODIUM: 138 mmol/L (ref 135–145)
TCO2: 22 mmol/L (ref 22–32)

## 2017-11-28 LAB — I-STAT BETA HCG BLOOD, ED (MC, WL, AP ONLY)

## 2017-11-28 LAB — PROTIME-INR
INR: 0.99
PROTHROMBIN TIME: 12.9 s (ref 11.4–15.2)

## 2017-11-28 LAB — ETHANOL

## 2017-11-28 LAB — SAMPLE TO BLOOD BANK

## 2017-11-28 MED ORDER — MORPHINE SULFATE (PF) 4 MG/ML IV SOLN
INTRAVENOUS | Status: AC
  Filled 2017-11-28: qty 1

## 2017-11-28 MED ORDER — SODIUM CHLORIDE 0.9 % IV BOLUS
1000.0000 mL | Freq: Once | INTRAVENOUS | Status: AC
  Administered 2017-11-28: 1000 mL via INTRAVENOUS

## 2017-11-28 MED ORDER — TETANUS-DIPHTH-ACELL PERTUSSIS 5-2.5-18.5 LF-MCG/0.5 IM SUSP
0.5000 mL | Freq: Once | INTRAMUSCULAR | Status: AC
  Administered 2017-11-28: 0.5 mL via INTRAMUSCULAR
  Filled 2017-11-28: qty 0.5

## 2017-11-28 MED ORDER — MORPHINE SULFATE (PF) 4 MG/ML IV SOLN
4.0000 mg | Freq: Once | INTRAVENOUS | Status: AC
Start: 2017-11-28 — End: 2017-11-28
  Administered 2017-11-28: 4 mg via INTRAVENOUS

## 2017-11-28 MED ORDER — IOHEXOL 300 MG/ML  SOLN
100.0000 mL | Freq: Once | INTRAMUSCULAR | Status: AC | PRN
  Administered 2017-11-28: 100 mL via INTRAVENOUS
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
## 2017-11-28 NOTE — Discharge Instructions (Addendum)
Jenna Stephens:  Thank you for allowing [place] to take care of you today.  We hope you begin feeling better soon.  To-Do: Please follow-up with your primary doctor if needed Take tylenol or motrin for pain If you develop severe abdominal pain or cannot eat/drink, come back to the ER. Please return to the Emergency Department or call 911 if you experience chest pain, shortness of breath, severe pain, severe fever, altered mental status, or have any reason to think that you need emergency medical care.  Thank you again.  Hope you feel better soon.
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
## 2017-11-28 NOTE — ED Notes (Signed)
Pt given cup of water, tolerated well.

## 2017-11-28 NOTE — ED Notes (Signed)
Patient transported to CT 

## 2017-11-28 NOTE — ED Notes (Signed)
Family at bedside. 
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
## 2017-11-28 NOTE — ED Notes (Signed)
Resident working with Dr. Darl Householder notified of elevated CG-4
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
## 2017-11-28 NOTE — ED Notes (Signed)
Pt walked in hallway without any issue. Just complains of pain

## 2017-11-28 NOTE — ED Notes (Signed)
Pt stable, ambulatory, states understanding of discharge instructions 

## 2017-11-29 ENCOUNTER — Encounter: Payer: Self-pay | Admitting: Emergency Medicine

## 2017-11-29 LAB — CDS SEROLOGY

## 2017-12-29 ENCOUNTER — Emergency Department (HOSPITAL_COMMUNITY): Payer: Medicaid Other

## 2017-12-29 ENCOUNTER — Inpatient Hospital Stay (HOSPITAL_COMMUNITY): Payer: Medicaid Other

## 2017-12-29 ENCOUNTER — Inpatient Hospital Stay (HOSPITAL_COMMUNITY)
Admission: EM | Admit: 2017-12-29 | Discharge: 2018-01-06 | DRG: 207 | Disposition: A | Discharge status: Home Health | Payer: Medicaid Other | Attending: Internal Medicine | Admitting: Internal Medicine

## 2017-12-29 ENCOUNTER — Encounter (HOSPITAL_COMMUNITY): Payer: Self-pay | Admitting: *Deleted

## 2017-12-29 ENCOUNTER — Other Ambulatory Visit: Payer: Self-pay

## 2017-12-29 DIAGNOSIS — J9602 Acute respiratory failure with hypercapnia: Secondary | ICD-10-CM | POA: Diagnosis present

## 2017-12-29 DIAGNOSIS — Z79899 Other long term (current) drug therapy: Secondary | ICD-10-CM

## 2017-12-29 DIAGNOSIS — E669 Obesity, unspecified: Secondary | ICD-10-CM

## 2017-12-29 DIAGNOSIS — Z6838 Body mass index (BMI) 38.0-38.9, adult: Secondary | ICD-10-CM

## 2017-12-29 DIAGNOSIS — Z978 Presence of other specified devices: Secondary | ICD-10-CM

## 2017-12-29 DIAGNOSIS — E876 Hypokalemia: Secondary | ICD-10-CM | POA: Diagnosis present

## 2017-12-29 DIAGNOSIS — F17210 Nicotine dependence, cigarettes, uncomplicated: Secondary | ICD-10-CM | POA: Diagnosis present

## 2017-12-29 DIAGNOSIS — J9600 Acute respiratory failure, unspecified whether with hypoxia or hypercapnia: Secondary | ICD-10-CM

## 2017-12-29 DIAGNOSIS — Z01818 Encounter for other preprocedural examination: Secondary | ICD-10-CM

## 2017-12-29 DIAGNOSIS — B9789 Other viral agents as the cause of diseases classified elsewhere: Secondary | ICD-10-CM | POA: Diagnosis present

## 2017-12-29 DIAGNOSIS — I10 Essential (primary) hypertension: Secondary | ICD-10-CM | POA: Diagnosis present

## 2017-12-29 DIAGNOSIS — D6489 Other specified anemias: Secondary | ICD-10-CM | POA: Diagnosis present

## 2017-12-29 DIAGNOSIS — J00 Acute nasopharyngitis [common cold]: Secondary | ICD-10-CM | POA: Diagnosis present

## 2017-12-29 DIAGNOSIS — R579 Shock, unspecified: Secondary | ICD-10-CM | POA: Diagnosis present

## 2017-12-29 DIAGNOSIS — R0603 Acute respiratory distress: Secondary | ICD-10-CM

## 2017-12-29 DIAGNOSIS — N179 Acute kidney failure, unspecified: Secondary | ICD-10-CM | POA: Diagnosis present

## 2017-12-29 DIAGNOSIS — Z791 Long term (current) use of non-steroidal anti-inflammatories (NSAID): Secondary | ICD-10-CM

## 2017-12-29 DIAGNOSIS — Z825 Family history of asthma and other chronic lower respiratory diseases: Secondary | ICD-10-CM | POA: Diagnosis not present

## 2017-12-29 DIAGNOSIS — Z6841 Body Mass Index (BMI) 40.0 and over, adult: Secondary | ICD-10-CM

## 2017-12-29 DIAGNOSIS — F1210 Cannabis abuse, uncomplicated: Secondary | ICD-10-CM | POA: Diagnosis present

## 2017-12-29 DIAGNOSIS — E8770 Fluid overload, unspecified: Secondary | ICD-10-CM | POA: Diagnosis not present

## 2017-12-29 DIAGNOSIS — J9601 Acute respiratory failure with hypoxia: Secondary | ICD-10-CM | POA: Diagnosis present

## 2017-12-29 DIAGNOSIS — E874 Mixed disorder of acid-base balance: Secondary | ICD-10-CM | POA: Diagnosis present

## 2017-12-29 DIAGNOSIS — M6282 Rhabdomyolysis: Secondary | ICD-10-CM | POA: Diagnosis not present

## 2017-12-29 DIAGNOSIS — J45902 Unspecified asthma with status asthmaticus: Secondary | ICD-10-CM | POA: Diagnosis present

## 2017-12-29 DIAGNOSIS — J4551 Severe persistent asthma with (acute) exacerbation: Secondary | ICD-10-CM

## 2017-12-29 DIAGNOSIS — Z0189 Encounter for other specified special examinations: Secondary | ICD-10-CM

## 2017-12-29 HISTORY — DX: Acute respiratory failure, unspecified whether with hypoxia or hypercapnia: J96.00

## 2017-12-29 LAB — BLOOD GAS, ARTERIAL
Acid-base deficit: 14.5 mmol/L — ABNORMAL HIGH (ref 0.0–2.0)
Acid-base deficit: 6.5 mmol/L — ABNORMAL HIGH (ref 0.0–2.0)
BICARBONATE: 15.2 mmol/L — AB (ref 20.0–28.0)
Bicarbonate: 21.9 mmol/L (ref 20.0–28.0)
Drawn by: 308601
Drawn by: 514251
FIO2: 60
FIO2: 60
LHR: 24 {breaths}/min
MECHVT: 370 mL
O2 Saturation: 99.2 %
O2 Saturation: 98.8 %
PEEP: 10 cmH2O
PEEP: 10 cmH2O
PO2 ART: 283 mmHg — AB (ref 83.0–108.0)
Patient temperature: 37
Patient temperature: 36.3
RATE: 24 resp/min
VT: 370 mL
pCO2 arterial: 54.4 mmHg — ABNORMAL HIGH (ref 32.0–48.0)
pCO2 arterial: 61.6 mmHg — ABNORMAL HIGH (ref 32.0–48.0)
pH, Arterial: 7.073 — CL (ref 7.350–7.450)
pH, Arterial: 7.172 — CL (ref 7.350–7.450)
pO2, Arterial: 156 mmHg — ABNORMAL HIGH (ref 83.0–108.0)

## 2017-12-29 LAB — LACTIC ACID, PLASMA
LACTIC ACID, VENOUS: 6.8 mmol/L — AB (ref 0.5–1.9)
LACTIC ACID, VENOUS: 5.6 mmol/L — AB (ref 0.5–1.9)

## 2017-12-29 LAB — BASIC METABOLIC PANEL
ANION GAP: 12 (ref 5–15)
Anion gap: 12 (ref 5–15)
BUN: 11 mg/dL (ref 6–20)
BUN: 7 mg/dL (ref 6–20)
CALCIUM: 9 mg/dL (ref 8.9–10.3)
CHLORIDE: 110 mmol/L (ref 98–111)
CO2: 25 mmol/L (ref 22–32)
CO2: 18 mmol/L — ABNORMAL LOW (ref 22–32)
CREATININE: 1.44 mg/dL — AB (ref 0.44–1.00)
Calcium: 7.8 mg/dL — ABNORMAL LOW (ref 8.9–10.3)
Chloride: 104 mmol/L (ref 98–111)
Creatinine, Ser: 0.78 mg/dL (ref 0.44–1.00)
GFR calc non Af Amer: >60 mL/min (ref >60)
GFR, EST AFRICAN AMERICAN: 52 mL/min — AB (ref >60)
GFR, EST NON AFRICAN AMERICAN: 45 mL/min — AB (ref >60)
Glucose, Bld: 122 mg/dL — ABNORMAL HIGH (ref 70–99)
Glucose, Bld: 270 mg/dL — ABNORMAL HIGH (ref 70–99)
POTASSIUM: 5.3 mmol/L — AB (ref 3.5–5.1)
Potassium: 2.7 mmol/L — CL (ref 3.5–5.1)
SODIUM: 141 mmol/L (ref 135–145)
SODIUM: 140 mmol/L (ref 135–145)

## 2017-12-29 LAB — BLOOD GAS, VENOUS
ACID-BASE DEFICIT: 4.1 mmol/L — AB (ref 0.0–2.0)
BICARBONATE: 20.5 mmol/L (ref 20.0–28.0)
O2 Saturation: 74.2 %
PCO2 VEN: 38.1 mmHg — AB (ref 44.0–60.0)
PO2 VEN: 50.1 mmHg — AB (ref 32.0–45.0)
Patient temperature: 98.6
pH, Ven: 7.352 (ref 7.250–7.430)

## 2017-12-29 LAB — CBC WITH DIFFERENTIAL/PLATELET
BASOS ABS: 0 10*3/uL (ref 0.0–0.1)
Basophils Relative: 0 %
EOS ABS: 1.8 10*3/uL — AB (ref 0.0–0.7)
Eosinophils Relative: 9 %
HCT: 37.6 % (ref 36.0–46.0)
HEMOGLOBIN: 11.6 g/dL — AB (ref 12.0–15.0)
LYMPHS PCT: 23 %
Lymphs Abs: 4.5 10*3/uL — ABNORMAL HIGH (ref 0.7–4.0)
MCH: 20.4 pg — ABNORMAL LOW (ref 26.0–34.0)
MCHC: 30.9 g/dL (ref 30.0–36.0)
MCV: 66 fL — ABNORMAL LOW (ref 78.0–100.0)
Monocytes Absolute: 1 10*3/uL (ref 0.1–1.0)
Monocytes Relative: 5 %
Neutro Abs: 12.4 10*3/uL — ABNORMAL HIGH (ref 1.7–7.7)
Neutrophils Relative %: 63 %
PLATELETS: 337 10*3/uL (ref 150–400)
RBC: 5.7 MIL/uL — AB (ref 3.87–5.11)
RDW: 15.7 % — ABNORMAL HIGH (ref 11.5–15.5)
WBC: 19.7 10*3/uL — AB (ref 4.0–10.5)

## 2017-12-29 LAB — PROCALCITONIN

## 2017-12-29 LAB — POCT I-STAT TROPONIN I: Troponin i, poc: 0.02 ng/mL (ref 0.00–0.08)

## 2017-12-29 LAB — INFLUENZA PANEL BY PCR (TYPE A & B)
INFLBPCR: NEGATIVE
Influenza A By PCR: NEGATIVE

## 2017-12-29 LAB — GLUCOSE, CAPILLARY
Glucose-Capillary: 152 mg/dL — ABNORMAL HIGH (ref 70–99)
Glucose-Capillary: 220 mg/dL — ABNORMAL HIGH (ref 70–99)

## 2017-12-29 LAB — BRAIN NATRIURETIC PEPTIDE: B Natriuretic Peptide: 7.7 pg/mL (ref 0.0–100.0)

## 2017-12-29 LAB — MRSA PCR SCREENING: MRSA by PCR: NEGATIVE

## 2017-12-29 LAB — TRIGLYCERIDES: Triglycerides: 194 mg/dL — ABNORMAL HIGH (ref <150)

## 2017-12-29 MED ORDER — STERILE WATER FOR INJECTION IV SOLN
INTRAVENOUS | Status: DC
  Administered 2017-12-29 – 2017-12-31 (×5): via INTRAVENOUS
  Filled 2017-12-29 (×6): qty 850

## 2017-12-29 MED ORDER — IPRATROPIUM-ALBUTEROL 0.5-2.5 (3) MG/3ML IN SOLN
3.0000 mL | Freq: Once | RESPIRATORY_TRACT | Status: AC
  Administered 2017-12-29: 3 mL via RESPIRATORY_TRACT

## 2017-12-29 MED ORDER — PHENYLEPHRINE HCL-NACL 10-0.9 MG/250ML-% IV SOLN
0.0000 ug/min | INTRAVENOUS | Status: DC
  Administered 2017-12-29 – 2017-12-30 (×3): 50 ug/min via INTRAVENOUS
  Filled 2017-12-29 (×4): qty 250

## 2017-12-29 MED ORDER — ALBUTEROL SULFATE (2.5 MG/3ML) 0.083% IN NEBU
10.0000 mg | INHALATION_SOLUTION | Freq: Once | RESPIRATORY_TRACT | Status: AC
  Administered 2017-12-29: 10 mg via RESPIRATORY_TRACT

## 2017-12-29 MED ORDER — LEVALBUTEROL HCL 0.63 MG/3ML IN NEBU: 0.6300 mg | INHALATION_SOLUTION | RESPIRATORY_TRACT | Status: DC

## 2017-12-29 MED ORDER — PROPOFOL 1000 MG/100ML IV EMUL
25.0000 ug/kg/min | INTRAVENOUS | Status: DC
  Administered 2017-12-29: 50 ug/kg/min via INTRAVENOUS
  Administered 2017-12-30: 40 ug/kg/min via INTRAVENOUS
  Administered 2017-12-30 (×2): 60 ug/kg/min via INTRAVENOUS
  Administered 2017-12-30: 80 ug/kg/min via INTRAVENOUS
  Administered 2017-12-30: 60 ug/kg/min via INTRAVENOUS
  Administered 2017-12-30: 80 ug/kg/min via INTRAVENOUS
  Administered 2017-12-30: 60 ug/kg/min via INTRAVENOUS
  Filled 2017-12-29 (×4): qty 100
  Filled 2017-12-29: qty 200
  Filled 2017-12-29 (×3): qty 100

## 2017-12-29 MED ORDER — LACTATED RINGERS IV BOLUS
2000.0000 mL | Freq: Once | INTRAVENOUS | Status: AC
  Administered 2017-12-29: 2000 mL via INTRAVENOUS

## 2017-12-29 MED ORDER — ZOLPIDEM TARTRATE 5 MG PO TABS: 5.0000 mg | ORAL_TABLET | Freq: Every evening | ORAL | Status: DC | PRN

## 2017-12-29 MED ORDER — HYDRALAZINE HCL 20 MG/ML IJ SOLN
10.0000 mg | Freq: Four times a day (QID) | INTRAMUSCULAR | Status: DC | PRN
  Administered 2017-12-29 – 2018-01-01 (×5): 10 mg via INTRAVENOUS
  Filled 2017-12-29 (×6): qty 1

## 2017-12-29 MED ORDER — FENTANYL 2500MCG IN NS 250ML (10MCG/ML) PREMIX INFUSION
100.0000 ug/h | INTRAVENOUS | Status: DC
  Administered 2017-12-30 – 2018-01-01 (×8): 300 ug/h via INTRAVENOUS
  Filled 2017-12-29 (×9): qty 250

## 2017-12-29 MED ORDER — SERTRALINE HCL 50 MG PO TABS
50.0000 mg | ORAL_TABLET | Freq: Every day | ORAL | Status: DC
  Administered 2017-12-29 – 2018-01-06 (×9): 50 mg via ORAL
  Filled 2017-12-29 (×9): qty 1

## 2017-12-29 MED ORDER — SODIUM CHLORIDE 0.9 % IV SOLN
2.0000 g | INTRAVENOUS | Status: DC
  Administered 2017-12-29 – 2017-12-31 (×3): 2 g via INTRAVENOUS
  Filled 2017-12-29 (×3): qty 2

## 2017-12-29 MED ORDER — ETOMIDATE 2 MG/ML IV SOLN
20.0000 mg | Freq: Once | INTRAVENOUS | Status: AC
  Administered 2017-12-29: 20 mg via INTRAVENOUS

## 2017-12-29 MED ORDER — PROPOFOL 1000 MG/100ML IV EMUL
0.0000 ug/kg/min | INTRAVENOUS | Status: DC
  Administered 2017-12-29: 25 ug/kg/min via INTRAVENOUS
  Administered 2017-12-29: 30 ug/kg/min via INTRAVENOUS
  Filled 2017-12-29 (×2): qty 100

## 2017-12-29 MED ORDER — ALBUTEROL SULFATE (2.5 MG/3ML) 0.083% IN NEBU
INHALATION_SOLUTION | RESPIRATORY_TRACT | Status: AC
  Administered 2017-12-29: 10 mg
  Filled 2017-12-29: qty 12

## 2017-12-29 MED ORDER — ONDANSETRON HCL 4 MG/2ML IJ SOLN
4.0000 mg | Freq: Four times a day (QID) | INTRAMUSCULAR | Status: DC | PRN
  Administered 2017-12-29: 4 mg via INTRAVENOUS
  Filled 2017-12-29 (×2): qty 2

## 2017-12-29 MED ORDER — MIDAZOLAM HCL 2 MG/2ML IJ SOLN
2.0000 mg | INTRAMUSCULAR | Status: DC | PRN
  Administered 2017-12-29: 2 mg via INTRAVENOUS

## 2017-12-29 MED ORDER — MIDAZOLAM HCL 2 MG/2ML IJ SOLN
2.0000 mg | INTRAMUSCULAR | Status: DC | PRN
  Administered 2017-12-29: 2 mg via INTRAVENOUS
  Filled 2017-12-29: qty 2

## 2017-12-29 MED ORDER — ALBUTEROL SULFATE (2.5 MG/3ML) 0.083% IN NEBU
5.0000 mg | INHALATION_SOLUTION | Freq: Once | RESPIRATORY_TRACT | Status: AC
  Administered 2017-12-29: 5 mg via RESPIRATORY_TRACT

## 2017-12-29 MED ORDER — ACETAMINOPHEN 325 MG PO TABS: 650.0000 mg | ORAL_TABLET | Freq: Four times a day (QID) | ORAL | Status: DC | PRN

## 2017-12-29 MED ORDER — POTASSIUM CHLORIDE 10 MEQ/100ML IV SOLN
10.0000 meq | INTRAVENOUS | Status: AC
  Administered 2017-12-29 (×3): 10 meq via INTRAVENOUS
  Filled 2017-12-29 (×3): qty 100

## 2017-12-29 MED ORDER — SODIUM CHLORIDE 0.9 % IJ SOLN
INTRAMUSCULAR | Status: AC
  Administered 2017-12-29: 07:00:00
  Filled 2017-12-29: qty 50

## 2017-12-29 MED ORDER — SODIUM BICARBONATE 8.4 % IV SOLN
200.0000 meq | Freq: Once | INTRAVENOUS | Status: AC
  Administered 2017-12-29: 200 meq via INTRAVENOUS
  Filled 2017-12-29: qty 200

## 2017-12-29 MED ORDER — POTASSIUM CHLORIDE IN NACL 20-0.9 MEQ/L-% IV SOLN
INTRAVENOUS | Status: DC
  Administered 2017-12-29: 13:00:00 via INTRAVENOUS
  Filled 2017-12-29 (×2): qty 1000

## 2017-12-29 MED ORDER — LEVALBUTEROL HCL 0.63 MG/3ML IN NEBU: 0.6300 mg | INHALATION_SOLUTION | Freq: Four times a day (QID) | RESPIRATORY_TRACT | Status: DC

## 2017-12-29 MED ORDER — ALBUTEROL SULFATE (2.5 MG/3ML) 0.083% IN NEBU
INHALATION_SOLUTION | RESPIRATORY_TRACT | Status: AC
  Administered 2017-12-29: 5 mg via RESPIRATORY_TRACT
  Filled 2017-12-29: qty 6

## 2017-12-29 MED ORDER — METHYLPREDNISOLONE SODIUM SUCC 125 MG IJ SOLR
80.0000 mg | Freq: Four times a day (QID) | INTRAMUSCULAR | Status: DC
  Administered 2017-12-29 – 2017-12-31 (×8): 80 mg via INTRAVENOUS
  Filled 2017-12-29 (×8): qty 2

## 2017-12-29 MED ORDER — ARTIFICIAL TEARS OPHTHALMIC OINT
1.0000 "application " | TOPICAL_OINTMENT | Freq: Three times a day (TID) | OPHTHALMIC | Status: DC
  Administered 2017-12-29 – 2018-01-02 (×11): 1 via OPHTHALMIC
  Filled 2017-12-29 (×2): qty 3.5

## 2017-12-29 MED ORDER — CLONIDINE HCL 0.1 MG PO TABS
0.1000 mg | ORAL_TABLET | Freq: Two times a day (BID) | ORAL | Status: DC
  Administered 2017-12-29 – 2018-01-01 (×6): 0.1 mg via ORAL
  Filled 2017-12-29 (×7): qty 1

## 2017-12-29 MED ORDER — ACETAMINOPHEN 650 MG RE SUPP: 650.0000 mg | Freq: Four times a day (QID) | RECTAL | Status: DC | PRN

## 2017-12-29 MED ORDER — METHYLPREDNISOLONE SODIUM SUCC 125 MG IJ SOLR
60.0000 mg | Freq: Four times a day (QID) | INTRAMUSCULAR | Status: DC
  Administered 2017-12-29: 60 mg via INTRAVENOUS
  Filled 2017-12-29: qty 2

## 2017-12-29 MED ORDER — ENOXAPARIN SODIUM 60 MG/0.6ML ~~LOC~~ SOLN
0.5000 mg/kg | SUBCUTANEOUS | Status: DC
  Administered 2017-12-29 – 2017-12-31 (×3): 55 mg via SUBCUTANEOUS
  Filled 2017-12-29 (×4): qty 0.55

## 2017-12-29 MED ORDER — SODIUM CHLORIDE 0.9 % IV SOLN: 1.0000 g | INTRAVENOUS | Status: DC

## 2017-12-29 MED ORDER — IOPAMIDOL (ISOVUE-370) INJECTION 76%
100.0000 mL | Freq: Once | INTRAVENOUS | Status: AC | PRN
  Administered 2017-12-29: 100 mL via INTRAVENOUS

## 2017-12-29 MED ORDER — FENTANYL CITRATE (PF) 100 MCG/2ML IJ SOLN
50.0000 ug | Freq: Once | INTRAMUSCULAR | Status: AC
  Administered 2017-12-29: 50 ug via INTRAVENOUS

## 2017-12-29 MED ORDER — FENTANYL CITRATE (PF) 100 MCG/2ML IJ SOLN
INTRAMUSCULAR | Status: AC
  Filled 2017-12-29: qty 2

## 2017-12-29 MED ORDER — LEVALBUTEROL HCL 0.63 MG/3ML IN NEBU
INHALATION_SOLUTION | RESPIRATORY_TRACT | Status: AC
  Administered 2017-12-29: 2.52 mg
  Filled 2017-12-29: qty 12

## 2017-12-29 MED ORDER — ENOXAPARIN SODIUM 40 MG/0.4ML ~~LOC~~ SOLN: 40.0000 mg | SUBCUTANEOUS | Status: DC

## 2017-12-29 MED ORDER — MIDAZOLAM HCL 2 MG/2ML IJ SOLN
INTRAMUSCULAR | Status: AC
  Administered 2017-12-29: 2 mg
  Filled 2017-12-29: qty 2

## 2017-12-29 MED ORDER — IPRATROPIUM-ALBUTEROL 0.5-2.5 (3) MG/3ML IN SOLN
RESPIRATORY_TRACT | Status: AC
  Administered 2017-12-29: 3 mL via RESPIRATORY_TRACT
  Filled 2017-12-29: qty 3

## 2017-12-29 MED ORDER — TRAMADOL HCL 50 MG PO TABS
50.0000 mg | ORAL_TABLET | Freq: Four times a day (QID) | ORAL | Status: DC | PRN
  Administered 2017-12-29: 50 mg via ORAL
  Filled 2017-12-29: qty 1

## 2017-12-29 MED ORDER — ONDANSETRON HCL 4 MG PO TABS: 4.0000 mg | ORAL_TABLET | Freq: Four times a day (QID) | ORAL | Status: DC | PRN

## 2017-12-29 MED ORDER — FENTANYL 2500MCG IN NS 250ML (10MCG/ML) PREMIX INFUSION
25.0000 ug/h | INTRAVENOUS | Status: DC
  Administered 2017-12-29: 150 ug/h via INTRAVENOUS
  Filled 2017-12-29: qty 250

## 2017-12-29 MED ORDER — ALBUTEROL (5 MG/ML) CONTINUOUS INHALATION SOLN
10.0000 mg/h | INHALATION_SOLUTION | RESPIRATORY_TRACT | Status: DC
  Filled 2017-12-29: qty 20

## 2017-12-29 MED ORDER — KETAMINE HCL 50 MG/5ML IJ SOSY
55.0000 mg | PREFILLED_SYRINGE | Freq: Once | INTRAMUSCULAR | Status: AC
  Administered 2017-12-29: 35 mg via INTRAVENOUS
  Filled 2017-12-29: qty 10

## 2017-12-29 MED ORDER — SODIUM BICARBONATE 8.4 % IV SOLN
100.0000 meq | Freq: Once | INTRAVENOUS | Status: AC
  Administered 2017-12-29: 100 meq via INTRAVENOUS
  Filled 2017-12-29: qty 100

## 2017-12-29 MED ORDER — FENTANYL CITRATE (PF) 100 MCG/2ML IJ SOLN: 50.0000 ug | Freq: Once | INTRAMUSCULAR | Status: AC

## 2017-12-29 MED ORDER — IPRATROPIUM-ALBUTEROL 0.5-2.5 (3) MG/3ML IN SOLN: 3.0000 mL | Freq: Four times a day (QID) | RESPIRATORY_TRACT | Status: DC

## 2017-12-29 MED ORDER — SENNOSIDES-DOCUSATE SODIUM 8.6-50 MG PO TABS: 1.0000 | ORAL_TABLET | Freq: Every evening | ORAL | Status: DC | PRN

## 2017-12-29 MED ORDER — METHYLPREDNISOLONE SODIUM SUCC 125 MG IJ SOLR
125.0000 mg | Freq: Once | INTRAMUSCULAR | Status: AC
  Administered 2017-12-29: 125 mg via INTRAVENOUS
  Filled 2017-12-29: qty 2

## 2017-12-29 MED ORDER — POTASSIUM CHLORIDE 10 MEQ/50ML IV SOLN
INTRAVENOUS | Status: AC
  Administered 2017-12-29: 10 meq
  Filled 2017-12-29: qty 50

## 2017-12-29 MED ORDER — SODIUM CHLORIDE 0.9 % IV SOLN
3.0000 ug/kg/min | INTRAVENOUS | Status: DC
  Administered 2017-12-29: 3 ug/kg/min via INTRAVENOUS
  Administered 2017-12-30: 3.5 ug/kg/min via INTRAVENOUS
  Administered 2017-12-31: 2 ug/kg/min via INTRAVENOUS
  Filled 2017-12-29 (×4): qty 20

## 2017-12-29 MED ORDER — LEVALBUTEROL HCL 0.63 MG/3ML IN NEBU
0.6300 mg | INHALATION_SOLUTION | RESPIRATORY_TRACT | Status: DC | PRN
  Administered 2017-12-29 – 2018-01-03 (×2): 0.63 mg via RESPIRATORY_TRACT

## 2017-12-29 MED ORDER — IOPAMIDOL (ISOVUE-370) INJECTION 76%
INTRAVENOUS | Status: AC
  Filled 2017-12-29: qty 100

## 2017-12-29 MED ORDER — MAGNESIUM SULFATE 2 GM/50ML IV SOLN
2.0000 g | Freq: Once | INTRAVENOUS | Status: AC
  Administered 2017-12-29: 2 g via INTRAVENOUS
  Filled 2017-12-29: qty 50

## 2017-12-29 MED ORDER — FENTANYL CITRATE (PF) 100 MCG/2ML IJ SOLN: 100.0000 ug | Freq: Once | INTRAMUSCULAR | Status: DC | PRN

## 2017-12-29 MED ORDER — ROCURONIUM BROMIDE 50 MG/5ML IV SOLN
80.0000 mg | Freq: Once | INTRAVENOUS | Status: AC
  Administered 2017-12-29: 80 mg via INTRAVENOUS
  Filled 2017-12-29: qty 8

## 2017-12-29 MED ORDER — HYDRALAZINE HCL 20 MG/ML IJ SOLN: 10.0000 mg | INTRAMUSCULAR | Status: DC | PRN

## 2017-12-29 MED ORDER — FENTANYL BOLUS VIA INFUSION
50.0000 ug | INTRAVENOUS | Status: DC | PRN
  Administered 2018-01-01: 50 ug via INTRAVENOUS
  Filled 2017-12-29: qty 50

## 2017-12-29 MED ORDER — ALBUTEROL (5 MG/ML) CONTINUOUS INHALATION SOLN
10.0000 mg/h | INHALATION_SOLUTION | Freq: Once | RESPIRATORY_TRACT | Status: AC
  Administered 2017-12-29: 10 mg/h via RESPIRATORY_TRACT
  Filled 2017-12-29: qty 20

## 2017-12-29 MED ORDER — SODIUM CHLORIDE 0.9 % IV BOLUS
500.0000 mL | Freq: Once | INTRAVENOUS | Status: AC
  Administered 2017-12-29: 500 mL via INTRAVENOUS

## 2017-12-29 MED ORDER — SODIUM CHLORIDE 0.9 % IV SOLN
500.0000 mg | INTRAVENOUS | Status: DC
  Administered 2017-12-29 – 2017-12-31 (×3): 500 mg via INTRAVENOUS
  Filled 2017-12-29 (×3): qty 500

## 2017-12-29 MED ORDER — FENTANYL BOLUS VIA INFUSION
50.0000 ug | INTRAVENOUS | Status: DC | PRN
  Filled 2017-12-29: qty 50

## 2017-12-29 MED ORDER — POTASSIUM CHLORIDE 10 MEQ/100ML IV SOLN: 10.0000 meq | INTRAVENOUS | Status: DC

## 2017-12-29 MED ORDER — FENTANYL CITRATE (PF) 100 MCG/2ML IJ SOLN
100.0000 ug | Freq: Once | INTRAMUSCULAR | Status: AC
  Administered 2017-12-29: 100 ug via INTRAVENOUS

## 2017-12-29 MED ORDER — IPRATROPIUM-ALBUTEROL 0.5-2.5 (3) MG/3ML IN SOLN
3.0000 mL | RESPIRATORY_TRACT | Status: DC
  Administered 2017-12-29 – 2018-01-02 (×23): 3 mL via RESPIRATORY_TRACT
  Filled 2017-12-29 (×22): qty 3

## 2017-12-29 MED ORDER — POTASSIUM CHLORIDE 10 MEQ/100ML IV SOLN
10.0000 meq | INTRAVENOUS | Status: AC
  Administered 2017-12-29 (×2): 10 meq via INTRAVENOUS
  Filled 2017-12-29 (×2): qty 100

## 2017-12-29 MED ORDER — MONTELUKAST SODIUM 10 MG PO TABS
10.0000 mg | ORAL_TABLET | Freq: Every day | ORAL | Status: DC
  Administered 2017-12-29 – 2018-01-05 (×8): 10 mg via ORAL
  Filled 2017-12-29 (×8): qty 1

## 2017-12-29 MED ORDER — SODIUM CHLORIDE 0.9% FLUSH
3.0000 mL | Freq: Two times a day (BID) | INTRAVENOUS | Status: DC
  Administered 2017-12-29 – 2018-01-05 (×15): 3 mL via INTRAVENOUS

## 2017-12-29 MED ORDER — ALBUTEROL SULFATE (2.5 MG/3ML) 0.083% IN NEBU
INHALATION_SOLUTION | RESPIRATORY_TRACT | Status: AC
  Filled 2017-12-29: qty 12

## 2017-12-29 MED ORDER — CISATRACURIUM BOLUS VIA INFUSION
10.0000 mg | Freq: Once | INTRAVENOUS | Status: AC
  Administered 2017-12-29: 10 mg via INTRAVENOUS
  Filled 2017-12-29: qty 10

## 2017-12-29 MED ORDER — ROPINIROLE HCL 1 MG PO TABS
2.0000 mg | ORAL_TABLET | Freq: Every day | ORAL | Status: DC
  Administered 2017-12-30 – 2018-01-05 (×7): 2 mg via ORAL
  Filled 2017-12-29 (×9): qty 2

## 2017-12-29 NOTE — ED Provider Notes (Addendum)
[facility] DEPT Provider Note   CSN: [id number] Arrival date & time: 12/29/17  0334     History   Chief Complaint Chief Complaint  Patient presents with  . Shortness of Breath    HPI Jenna Stephens is a 42 y.o. female with a past medical history of hypertension presents to ED for evaluation of 3-day history of shortness of breath, cough, chest pain, wheezing.  Symptoms got worse prior to arrival.  She believes that her symptoms were initially due to a cold.  She denies history of asthma. Denies leg swelling, sick contacts, hemoptysis, recent travel, fevers.  HPI  Past Medical History:  Diagnosis Date  . Hypertension     There are no active problems to display for this patient.   Past Surgical History:  Procedure Laterality Date  . TONSILLECTOMY       OB History    Gravida  5   Para  2   Term  2   Preterm  0   AB  2   Living        SAB  2   TAB  0   Ectopic  0   Multiple      Live Births               Home Medications    Prior to Admission medications   Medication Sig Start Date End Date Taking? Authorizing Provider  acetaminophen (TYLENOL) 325 MG tablet Take 325-650 mg by mouth every 6 (six) hours as needed (for pain or headaches).    [provider]  Aspirin-Salicylamide-Caffeine (BC HEADACHE POWDER PO) Take 1 packet by mouth See admin instructions. Dissolve 1 packet in the mouth one to three times a day as needed for mild to severe headaches    [provider]  Doxylamine-Pyridoxine 10-10 MG TBEC Take 2 tablets by mouth at bedtime as needed. 09/25/16   Seabron Spates, CNM  labetalol (NORMODYNE) 200 MG tablet Take 1 tablet (200 mg total) by mouth every 12 (twelve) hours. 09/25/16   Seabron Spates, CNM  promethazine (PHENERGAN) 25 MG tablet Take 1 tablet (25 mg total) by mouth every 6 (six) hours as needed for nausea or vomiting. Patient not taking: Reported on 10/10/2016 05/22/15   Quintella Reichert, MD    Family History Family History  Problem Relation Age of Onset  . Diabetes Mother   . Hypertension Mother     Social History Social History   Tobacco Use  . Smoking status: Never Smoker  . Smokeless tobacco: Never Used  Substance Use Topics  . Alcohol use: Yes    Comment: weekends only  . Drug use: Never    Types: Marijuana    Comment: last use September 24 2016     Allergies   Patient has no known allergies.   Review of Systems Review of Systems  Constitutional: Negative for appetite change, chills and fever.  HENT: Positive for congestion. Negative for ear pain, rhinorrhea, sneezing and sore throat.   Eyes: Negative for photophobia and visual disturbance.  Respiratory: Positive for cough, shortness of breath and wheezing. Negative for chest tightness.   Cardiovascular: Positive for chest pain. Negative for palpitations.  Gastrointestinal: Negative for abdominal pain, blood in stool, constipation, diarrhea, nausea and vomiting.  Genitourinary: Negative for dysuria, hematuria and urgency.  Musculoskeletal: Negative for myalgias.  Skin: Negative for rash.  Neurological: Negative for dizziness, weakness and light-headedness.     Physical Exam Updated Vital Signs  BP (!) 161/113   Pulse (!) 128   Temp 98.3 F (36.8 C)   Resp (!) 25   Ht (S) 5\' 7"  (1.702 m)   Wt (S) 111 kg   LMP 11/24/2017   SpO2 98%   BMI 38.33 kg/m   Physical Exam  Constitutional: She appears well-developed and well-nourished. No distress.  Speaking in short sentences at a time.  HENT:  Head: Normocephalic and atraumatic.  Nose: Nose normal.  Eyes: Conjunctivae and EOM are normal. Left eye exhibits no discharge. No scleral icterus.  Neck: Normal range of motion. Neck supple.  Cardiovascular: Regular rhythm, normal heart sounds and intact distal pulses. Tachycardia present. Exam reveals no gallop and no friction rub.  No murmur heard. Pulmonary/Chest: Accessory muscle usage  present. Tachypnea noted. She has wheezes in the right upper field, the right middle field, the right lower field, the left upper field, the left middle field and the left lower field.  Abdominal: Soft. Bowel sounds are normal. She exhibits no distension. There is no tenderness. There is no guarding.  Musculoskeletal: Normal range of motion. She exhibits no edema.       Right lower leg: She exhibits no edema.       Left lower leg: She exhibits no edema.  No lower extremity edema, erythema or calf tenderness bilaterally.  Neurological: She is alert. She exhibits normal muscle tone. Coordination normal.  Skin: Skin is warm and dry. No rash noted.  Psychiatric: She has a normal mood and affect.  Nursing note and vitals reviewed.    ED Treatments / Results  Labs (all labs ordered are listed, but only abnormal results are displayed) Labs Reviewed  BASIC METABOLIC PANEL - Abnormal; Notable for the following components:      Result Value   Potassium 2.7 (*)    Glucose, Bld 122 (*)    All other components within normal limits  CBC WITH DIFFERENTIAL/PLATELET - Abnormal; Notable for the following components:   WBC 19.7 (*)    RBC 5.70 (*)    Hemoglobin 11.6 (*)    MCV 66.0 (*)    MCH 20.4 (*)    RDW 15.7 (*)    Neutro Abs 12.4 (*)    Lymphs Abs 4.5 (*)    Eosinophils Absolute 1.8 (*)    All other components within normal limits  BRAIN NATRIURETIC PEPTIDE  BLOOD GAS, ARTERIAL  I-STAT TROPONIN, ED  POCT I-STAT TROPONIN I    EKG EKG Interpretation  Date/Time:  Sunday December 29 2017 03:46:55 EDT Ventricular Rate:  117 PR Interval:    QRS Duration: 91 QT Interval:  342 QTC Calculation: 480 R Axis:   72 Text Interpretation:  Sinus tachycardia Nonspecific T abnormalities, lateral leads No acute changes Nonspecific ST and T wave abnormality Confirmed by Varney Biles [phone]) on 12/29/2017 6:39:18 AM   Radiology Ct Angio Chest Pe W/cm &/or Wo Cm  Result Date: 12/29/2017 CLINICAL  DATA:  Shortness of breath and cough. EXAM: CT ANGIOGRAPHY CHEST WITH CONTRAST TECHNIQUE: Multidetector CT imaging of the chest was performed using the standard protocol during bolus administration of intravenous contrast. Multiplanar CT image reconstructions and MIPs were obtained to evaluate the vascular anatomy. CONTRAST:  The patient received 2 doses Isovue 370, 100 mL for each dose. COMPARISON:  Chest x-ray December 29, 2017 FINDINGS: Cardiovascular: The heart size is normal. No obvious coronary artery calcifications identified. The thoracic aorta demonstrates no aneurysm, dissection, or atherosclerotic change. Evaluation of pulmonary arteries is limited due  to significant respiratory motion. The study was repeated with continued motion on the repeat study. No obvious pulmonary emboli identified. Evaluation is markedly limited beyond the main pulmonary arterial level, however. Mediastinum/Nodes: No enlarged mediastinal, hilar, or axillary lymph nodes. Thyroid gland, trachea, and esophagus demonstrate no significant findings. Lungs/Pleura: Central airways are normal. No pneumothorax. Evaluation is limited due to respiratory motion. Opacity in left apex demonstrates a cluster nodular appearance well seen on series 11, images 36 and 37. Mild focal opacity in the lateral right upper lobe on series 11, image 58 is well. Mild ground-glass opacity in the right middle lobe on series 11, image 95a. No other nodules or masses identified. Upper Abdomen: No acute abnormality. Musculoskeletal: No chest wall abnormality. No acute or significant osseous findings. Review of the MIP images confirms the above findings. IMPRESSION: 1. The study is nearly nondiagnostic beyond the level of the main pulmonary arteries. Within the significant limitations, no pulmonary emboli are definitely identified. If concern persists, a repeat study when the patient can better follow breathing directions or a V/Q scan could further evaluate. If  another study is necessary today, I would recommend a V/Q scan as the patient has already received 2 contrast doses today. 2. Patchy nodular opacities in the left apex. Mild focal opacity in the lateral right lung. Findings are suspicious for an infectious or inflammatory process. Recommend short-term follow-up CT imaging to ensure resolution. Electronically Signed   By: Dorise Bullion III M.D   On: 12/29/2017 07:41   Dg Chest Port 1 View  Result Date: 12/29/2017 CLINICAL DATA:  Shortness of breath and cough EXAM: PORTABLE CHEST 1 VIEW COMPARISON:  Chest radiograph 10/10/2016 FINDINGS: The heart size and mediastinal contours are within normal limits. Both lungs are clear. The visualized skeletal structures are unremarkable. IMPRESSION: No active disease. Electronically Signed   By: Ulyses Jarred M.D.   On: 12/29/2017 05:56    Procedures Procedures (including critical care time)  CRITICAL CARE Performed by: Delia Heady   Total critical care time: 45 minutes  Critical care time was exclusive of separately billable procedures and treating other patients.  Critical care was necessary to treat or prevent imminent or life-threatening deterioration.  Critical care was time spent personally by me on the following activities: development of treatment plan with patient and/or surrogate as well as nursing, discussions with consultants, evaluation of patient's response to treatment, examination of patient, obtaining history from patient or surrogate, ordering and performing treatments and interventions, ordering and review of laboratory studies, ordering and review of radiographic studies, pulse oximetry and re-evaluation of patient's condition.   Medications Ordered in ED Medications  potassium chloride 10 mEq in 100 mL IVPB (10 mEq Intravenous New Bag/Given 12/29/17 0602)  lactated ringers bolus 2,000 mL (has no administration in time range)  albuterol (PROVENTIL) (2.5 MG/3ML) 0.083% nebulizer  solution 5 mg (5 mg Nebulization Given 12/29/17 0400)  ipratropium-albuterol (DUONEB) 0.5-2.5 (3) MG/3ML nebulizer solution 3 mL (3 mLs Nebulization Given 12/29/17 0412)  albuterol (PROVENTIL,VENTOLIN) solution continuous neb (10 mg/hr Nebulization Given 12/29/17 0454)  magnesium sulfate IVPB 2 g 50 mL (0 g Intravenous Stopped 12/29/17 0720)  iopamidol (ISOVUE-370) 76 % injection 100 mL (100 mLs Intravenous Contrast Given 12/29/17 0652)  sodium chloride 0.9 % injection (  Given by Other 12/29/17 0719)  methylPREDNISolone sodium succinate (SOLU-MEDROL) 125 mg/2 mL injection 125 mg (125 mg Intravenous Given 12/29/17 0642)  ketamine 50 mg in normal saline 5 mL (10 mg/mL) syringe (35 mg Intravenous Given  12/29/17 0654)     Initial Impression / Assessment and Plan / ED Course  I have reviewed the triage vital signs and the nursing notes.  Pertinent labs & imaging results that were available during my care of the patient were reviewed by me and considered in my medical decision making (see chart for details).  Clinical Course as of Dec 29 744  Sun Dec 29, 2017  0419 Patient with ongoing wheezing despite albuterol nebulizer.  Lab work and give [employer].   [HK]  0602 Patient continues to have wheezing throughout and increased work of breathing.  Will obtain CT angios to rule out PE.   [HK]    Clinical Course User Index [HK] Delia Heady, PA-C    42 year old female with a past medical history of hypertension presents to ED for evaluation of shortness of breath, wheezing, chest pain and cough.  Symptoms have been going on for 3 days but worsened today.  She denies history of asthma.  On exam initially patient wheezing throughout bilateral lung fields with increased work of breathing, tachycardia and tachypnea.  She is also hypertensive.  Lab work significant for potassium of 2.7, leukocytosis at 20 with left shift, BMP unremarkable.  I-STAT troponin negative.  EKG shows sinus tachycardia.  Chest x-ray unremarkable.   CT PE study done.  Patient placed on BiPAP and continuous albuterol with only mild improvement with prior DuoNeb given.  Patient given Solu-Medrol, IV magnesium and IV potassium.  CT angios shows possible opacities in bilateral lungs.  No evidence of PE although inadequate study.  Recommend VQ scan if suspicion persists.  Patient denies any fever.  Although she does have leukocytosis, low suspicion for infectious etiology.  Critical care to evaluate patient.  She reports improvement in work of breathing with the above-mentioned treatment.  Portions of this note were generated with [employer]. Dictation errors may occur despite best attempts at proofreading.  Final Clinical Impressions(s) / ED Diagnoses   Final diagnoses:  Respiratory distress  Hypokalemia    ED Discharge Orders    None          Delia Heady, PA-C 12/29/17 [place], Ankit, MD 12/29/17 [phone]

## 2017-12-29 NOTE — Progress Notes (Signed)
Pharmacy Antibiotic Note  Jenna Stephens is a 42 y.o. female admitted on 12/29/2017 with CAP, asthma exacerbation. Pharmacy has been consulted for Ceftriaxone and Azithromycin dosing.  Plan:  Ceftriaxone to 2g IV q24h (higher dose due to weight)  Azithromycin 500mg  IV q24h  Above antibiotics do not require renal/hepatic adjustment, so pharmacy will sign off at this time. Please re-consult if we can be of further assistance.   Height: (S) 5\' 7"  (170.2 cm) Weight: (S) 244 lb 11.4 oz (111 kg) IBW/kg (Calculated) : 61.6  Temp (24hrs), Avg:98.3 F (36.8 C), Min:98.3 F (36.8 C), Max:98.3 F (36.8 C)  Recent Labs  Lab 12/29/17 0411  WBC 19.7*  CREATININE 0.78    Estimated Creatinine Clearance: 120.1 mL/min (by C-G formula based on SCr of 0.78 mg/dL).    No Known Allergies  Antimicrobials this admission: 9/8 Ceftriaxone >>  9/8 Azithromycin >>  Dose adjustments this admission: --  Microbiology results: 9/8 BCx: sent 9/8 RVP: ordered 9/8 Influenza panel by PCR: ordered 9/8 HIV antibody: ordered  Thank you for allowing pharmacy to be a part of this patient's care.   Lindell Spar, PharmD, BCPS Pager: [phone] 12/29/2017 9:37 AM

## 2017-12-29 NOTE — H&P (Signed)
History and Physical  Jenna Stephens KXF:[id number] DOB: 07-01-1975 DOA: 12/29/2017   PCP: Patient, No Pcp Per  Patient coming from: Home & is able to ambulate independently   Chief Complaint: SOB  HPI: Jenna Stephens is a 42 y.o. female with medical history significant for hypertension, tobacco abuse, marijuana use presents to the ER with 3 days of nonproductive cough, significant shortness of breath with associated wheezing and chest tightness.  Patient initially thought she had a cold for the past few weeks, but was not getting any better.  Denies any sick contacts, has 2 pet [place] pigs that she has had for couple of years.  No new pets, no new furniture or carpets.  No significant travel history.  Patient denies any chest pain, abdominal pain, nausea/vomiting, fever/chills, diarrhea.  Denies any vaping.  ED Course: In the ED, patient was noted to be tachypneic, tachycardic, with uncontrolled BP.  Treated with Solu-Medrol, magnesium, nebulizers and placed on BiPAP, without significant improvement. PCCM consulted. Patient was given 1 dose of IV ketamine with significant improvement.  Chest x-ray unremarkable.  Currently off BiPAP, on 2 L of oxygen.  Still with some increased work of breathing.  Patient admitted to SDU  Review of Systems: Review of systems are otherwise negative   Past Medical History:  Diagnosis Date  . Hypertension    Past Surgical History:  Procedure Laterality Date  . TONSILLECTOMY      Social History:  reports that she has never smoked. She has never used smokeless tobacco. She reports that she drinks alcohol. She reports that she does not use drugs.   No Known Allergies  Family History  Problem Relation Age of Onset  . Diabetes Mother   . Hypertension Mother     Mother has asthma  Prior to Admission medications   Medication Sig Start Date End Date Taking? Authorizing Provider  acetaminophen (TYLENOL) 325 MG tablet Take 325-650 mg by mouth every 6  (six) hours as needed (for pain or headaches).   Yes [provider]  aspirin-sod bicarb-citric acid (ALKA-SELTZER) 325 MG TBEF tablet Take 325 mg by mouth every 6 (six) hours as needed (cold symptoms).   Yes [provider]  cloNIDine (CATAPRES) 0.1 MG tablet Take 0.1 mg by mouth 2 (two) times daily.   Yes [provider]  cyclobenzaprine (FLEXERIL) 10 MG tablet Take 10 mg by mouth 3 (three) times daily as needed for muscle spasms.   Yes [provider]  ibuprofen (ADVIL,MOTRIN) 200 MG tablet Take 600-800 mg by mouth every 6 (six) hours as needed for mild pain.   Yes [provider]  meloxicam (MOBIC) 15 MG tablet Take 15 mg by mouth daily.   Yes [provider]  rOPINIRole (REQUIP) 2 MG tablet Take 2 mg by mouth at bedtime.   Yes [provider]  sertraline (ZOLOFT) 50 MG tablet Take 50 mg by mouth daily.   Yes [provider]  zolpidem (AMBIEN) 10 MG tablet Take 10 mg by mouth at bedtime as needed for sleep.   Yes [provider]  Doxylamine-Pyridoxine 10-10 MG TBEC Take 2 tablets by mouth at bedtime as needed. Patient not taking: Reported on 12/29/2017 09/25/16   Seabron Spates, CNM  labetalol (NORMODYNE) 200 MG tablet Take 1 tablet (200 mg total) by mouth every 12 (twelve) hours. Patient not taking: Reported on 12/29/2017 09/25/16   Seabron Spates, CNM  promethazine (PHENERGAN) 25 MG tablet Take 1 tablet (25 mg total)  by mouth every 6 (six) hours as needed for nausea or vomiting. Patient not taking: Reported on 10/10/2016 05/22/15   Quintella Reichert, MD    Physical Exam: BP (!) 168/113   Pulse (!) 124   Temp 98.3 F (36.8 C)   Resp (!) 22   Ht (S) 5\' 7"  (1.702 m)   Wt (S) 111 kg   LMP 11/24/2017   SpO2 98%   BMI 38.33 kg/m   General: Mild distress Eyes: Normal ENT: Normal Neck: Supple Cardiovascular: S1, S2 present, tachycardic Respiratory: Bilateral expiratory wheezing Abdomen: Soft, obese,  non-tender, bowel sounds present Skin: Normal Musculoskeletal: No pedal edema bilaterally Psychiatric: Normal mood Neurologic: No focal neurologic deficits          Labs on Admission:  Basic Metabolic Panel: Recent Labs  Lab 12/29/17 0411  NA 141  K 2.7*  CL 104  CO2 25  GLUCOSE 122*  BUN 11  CREATININE 0.78  CALCIUM 9.0   Liver Function Tests: No results for input(s): AST, ALT, ALKPHOS, BILITOT, PROT, ALBUMIN in the last 168 hours. No results for input(s): LIPASE, AMYLASE in the last 168 hours. No results for input(s): AMMONIA in the last 168 hours. CBC: Recent Labs  Lab 12/29/17 0411  WBC 19.7*  NEUTROABS 12.4*  HGB 11.6*  HCT 37.6  MCV 66.0*  PLT 337   Cardiac Enzymes: No results for input(s): CKTOTAL, CKMB, CKMBINDEX, TROPONINI in the last 168 hours.  BNP (last 3 results) Recent Labs    12/29/17 0426  BNP 7.7    ProBNP (last 3 results) No results for input(s): PROBNP in the last 8760 hours.  CBG: No results for input(s): GLUCAP in the last 168 hours.  Radiological Exams on Admission: Ct Angio Chest Pe W/cm &/or Wo Cm  Result Date: 12/29/2017 CLINICAL DATA:  Shortness of breath and cough. EXAM: CT ANGIOGRAPHY CHEST WITH CONTRAST TECHNIQUE: Multidetector CT imaging of the chest was performed using the standard protocol during bolus administration of intravenous contrast. Multiplanar CT image reconstructions and MIPs were obtained to evaluate the vascular anatomy. CONTRAST:  The patient received 2 doses Isovue 370, 100 mL for each dose. COMPARISON:  Chest x-ray December 29, 2017 FINDINGS: Cardiovascular: The heart size is normal. No obvious coronary artery calcifications identified. The thoracic aorta demonstrates no aneurysm, dissection, or atherosclerotic change. Evaluation of pulmonary arteries is limited due to significant respiratory motion. The study was repeated with continued motion on the repeat study. No obvious pulmonary emboli identified. Evaluation  is markedly limited beyond the main pulmonary arterial level, however. Mediastinum/Nodes: No enlarged mediastinal, hilar, or axillary lymph nodes. Thyroid gland, trachea, and esophagus demonstrate no significant findings. Lungs/Pleura: Central airways are normal. No pneumothorax. Evaluation is limited due to respiratory motion. Opacity in left apex demonstrates a cluster nodular appearance well seen on series 11, images 36 and 37. Mild focal opacity in the lateral right upper lobe on series 11, image 58 is well. Mild ground-glass opacity in the right middle lobe on series 11, image 95a. No other nodules or masses identified. Upper Abdomen: No acute abnormality. Musculoskeletal: No chest wall abnormality. No acute or significant osseous findings. Review of the MIP images confirms the above findings. IMPRESSION: 1. The study is nearly nondiagnostic beyond the level of the main pulmonary arteries. Within the significant limitations, no pulmonary emboli are definitely identified. If concern persists, a repeat study when the patient can better follow breathing directions or a V/Q scan could further evaluate. If another study is necessary today,  I would recommend a V/Q scan as the patient has already received 2 contrast doses today. 2. Patchy nodular opacities in the left apex. Mild focal opacity in the lateral right lung. Findings are suspicious for an infectious or inflammatory process. Recommend short-term follow-up CT imaging to ensure resolution. Electronically Signed   By: Dorise Bullion III M.D   On: 12/29/2017 07:41   Dg Chest Port 1 View  Result Date: 12/29/2017 CLINICAL DATA:  Shortness of breath and cough EXAM: PORTABLE CHEST 1 VIEW COMPARISON:  Chest radiograph 10/10/2016 FINDINGS: The heart size and mediastinal contours are within normal limits. Both lungs are clear. The visualized skeletal structures are unremarkable. IMPRESSION: No active disease. Electronically Signed   By: Ulyses Jarred M.D.   On:  12/29/2017 05:56    EKG: Independently reviewed.  Sinus tachycardia  Assessment/Plan Present on Admission: . Acute respiratory failure (HCC)  Active Problems:   Acute respiratory failure (HCC)   HTN (hypertension)   Obesity  Sepsis 2/2 acute hypoxic respiratory failure 2/2 ?asthma exacerbation Vs CAP Tachycardic, tachypneic, leukocytosis on admission Currently afebrile with leukocytosis (now on steroids) LA 3.28, will trend ABG couldn't be obtained, VBG showed PH of 7.35, pCO2 38.1, pO2 50.1 Procalcitonin pending Respiratory panel, influenza pending BC x2 pending Chest x-ray unremarkable Start IV ceftriaxone, azithromycin Solu-Medrol, duo nebs Supplemental O2, BiPAP as needed PCCM on board Monitor closely in SDU  Hypokalemia Replace PRN  Hypertension Uncontrolled, likely due to distress Continue home clonidine, PRN hydralazine  Tobacco/marijuana abuse Smokes 2 cigarettes/day Advised to quit, refused nicotine patch  Obesity Advised lifestyle modification   DVT prophylaxis: Lovenox  Code Status: Full  Family Communication: Mother at bedside  Disposition Plan: Home  Consults called: PCCM  Admission status: Inpatient    Alma Friendly MD [facility]   If 7PM-7AM, please contact night-coverage [email or web address] Password [email or web address]  12/29/2017, 10:12 AM

## 2017-12-29 NOTE — Consult Note (Addendum)
Jenna Stephens  KGU:[id number] DOB: Nov 12, 1975 DOA: 12/29/2017 PCP: Patient, No Pcp Per    LOS: 0 days   Reason for Consult / Chief Complaint:  Dyspnea, wheezing  Consulting MD and date:  Dollene Cleveland, ED MD- 12/29/17  HPI/Summary of hospital stay:  42 year old with history of hypertension presents with 3 days of acute onset dyspnea, wheezing.  Patient has been dealing with upper respiratory tract infection, cold for the past few weeks.  No known exposures. Treated in the ED with 125 mg Solu-Medrol, magnesium, nebulizers and BiPAP. PCCM called for evaluation.  At time of my examination she had received 1 dose of IV ketamine with improvement in her dyspnea. Currently off BiPAP and on 2 L oxygen.  Pets: Has 2 [place] pigs, no cats, dogs, birds or farm animals Occupation: Works in Barista support for [employer]: Reports that bathroom has a leak however no known mold, no hot tub, Jacuzzi Smoking history: Smokes 2 cigarettes a day, no vaping Travel history: No significant travel history Relevant family history: Mother has asthma  Subjective:  Complains of nonproductive cough, dyspnea  Objective   Blood pressure (!) 183/125, pulse (!) 136, temperature 98.3 F (36.8 C), resp. rate (!) 33, height (S) 5\' 7"  (1.702 m), weight (S) 111 kg, last menstrual period 11/24/2017, SpO2 100 %, unknown if currently breastfeeding.    FiO2 (%):  [35 %] 35 %   Intake/Output Summary (Last 24 hours) at 12/29/2017 0730 Last data filed at 12/29/2017 0720 Gross per 24 hour  Intake 45.8 ml  Output -  Net 45.8 ml   Filed Weights   12/29/17 0718  Weight: (S) 111 kg    Examination: Gen:      Mild distress HEENT:  EOMI, sclera anicteric Neck:     No masses; no thyromegaly Lungs:    Bi lateral expiratory wheezes, no crackles.  Moving air adequately. CV:         Tachycardic, regular, no murmurs Abd:      + bowel sounds; soft, non-tender; no palpable masses, no distension Ext:    No edema; adequate  peripheral perfusion Skin:      Warm and dry; no rash Neuro: alert and oriented x 3  Consults: date of consult/date signed off & final recs:    Procedures:   Significant Diagnostic Tests: Chest x-ray 9/8-no acute abnormality CTA 9/8- no pulmonary embolism in the main pulmonary arteries, mild nodular patchy opacities in the left apex.  I have reviewed the images personally.  Micro Data:  Antimicrobials:   Resolved Hospital Problem list     Assessment & Plan:  42 year old with asthma exacerbation in the setting of respiratory tract infection.  Resp failure Improving at the time of my examination but with still mild WOB Okay for admission to stepdown.  We will follow as consult. Check ABG, respiratory virus panel, procalcitonin Start ceftriaxone, azithromycin for CAP coverage. Continue IV Solu-Medrol, standing nebulizer Use BiPAP as needed for increased work of breathing Recommend follow-up as an outpatient in the pulmonary clinic.  Elevated LA Likely from increased WOB, nebs Continue IVF. Follow LA  Disposition / Summary of Today's Plan 12/29/17     Best Practice / Goals of Care / Disposition.   DVT prophylaxis: Per primary team GI prophylaxis: NA Diet: NPO Mobility:Bedrest Code Status: Full Code Family Communication: Pt and family updated at bedside  Labs   CBC: Recent Labs  Lab 12/29/17 0411  WBC 19.7*  NEUTROABS 12.4*  HGB  11.6*  HCT 37.6  MCV 66.0*  PLT 195   Basic Metabolic Panel: Recent Labs  Lab 12/29/17 0411  NA 141  K 2.7*  CL 104  CO2 25  GLUCOSE 122*  BUN 11  CREATININE 0.78  CALCIUM 9.0   GFR: Estimated Creatinine Clearance: 120.1 mL/min (by C-G formula based on SCr of 0.78 mg/dL). Recent Labs  Lab 12/29/17 0411  WBC 19.7*   Liver Function Tests: No results for input(s): AST, ALT, ALKPHOS, BILITOT, PROT, ALBUMIN in the last 168 hours. No results for input(s): LIPASE, AMYLASE in the last 168 hours. No results for input(s):  AMMONIA in the last 168 hours. ABG    Component Value Date/Time   TCO2 22 11/28/2017 1851    Coagulation Profile: No results for input(s): INR, PROTIME in the last 168 hours. Cardiac Enzymes: No results for input(s): CKTOTAL, CKMB, CKMBINDEX, TROPONINI in the last 168 hours. HbA1C: No results found for: HGBA1C CBG: No results for input(s): GLUCAP in the last 168 hours.   Review of Systems:    Past medical history  She,  has a past medical history of Hypertension.   Surgical History    Past Surgical History:  Procedure Laterality Date  . TONSILLECTOMY       Social History   Social History   Socioeconomic History  . Marital status: Single    Spouse name: Not on file  . Number of children: Not on file  . Years of education: Not on file  . Highest education level: Not on file  Occupational History  . Not on file  Social Needs  . Financial resource strain: Not on file  . Food insecurity:    Worry: Not on file    Inability: Not on file  . Transportation needs:    Medical: Not on file    Non-medical: Not on file  Tobacco Use  . Smoking status: Never Smoker  . Smokeless tobacco: Never Used  Substance and Sexual Activity  . Alcohol use: Yes    Comment: weekends only  . Drug use: Never    Types: Marijuana    Comment: last use September 24 2016  . Sexual activity: Yes    Birth control/protection: None  Lifestyle  . Physical activity:    Days per week: Not on file    Minutes per session: Not on file  . Stress: Not on file  Relationships  . Social connections:    Talks on phone: Not on file    Gets together: Not on file    Attends religious service: Not on file    Active member of club or organization: Not on file    Attends meetings of clubs or organizations: Not on file    Relationship status: Not on file  . Intimate partner violence:    Fear of current or ex partner: Not on file    Emotionally abused: Not on file    Physically abused: Not on file    Forced  sexual activity: Not on file  Other Topics Concern  . Not on file  Social History Narrative   ** Merged History Encounter **      ,  reports that she has never smoked. She has never used smokeless tobacco. She reports that she drinks alcohol. She reports that she does not use drugs.   Family history   Her family history includes Diabetes in her mother; Hypertension in her mother.   Allergies No Known Allergies  Home meds  Prior  to Admission medications   Medication Sig Start Date End Date Taking? Authorizing Provider  acetaminophen (TYLENOL) 325 MG tablet Take 325-650 mg by mouth every 6 (six) hours as needed (for pain or headaches).    [provider]  Aspirin-Salicylamide-Caffeine (BC HEADACHE POWDER PO) Take 1 packet by mouth See admin instructions. Dissolve 1 packet in the mouth one to three times a day as needed for mild to severe headaches    [provider]  Doxylamine-Pyridoxine 10-10 MG TBEC Take 2 tablets by mouth at bedtime as needed. 09/25/16   Seabron Spates, CNM  labetalol (NORMODYNE) 200 MG tablet Take 1 tablet (200 mg total) by mouth every 12 (twelve) hours. 09/25/16   Seabron Spates, CNM  promethazine (PHENERGAN) 25 MG tablet Take 1 tablet (25 mg total) by mouth every 6 (six) hours as needed for nausea or vomiting. Patient not taking: Reported on 10/10/2016 05/22/15   Quintella Reichert, MD    Marshell Garfinkel MD [facility] 12/29/2017, 8:15 AM

## 2017-12-29 NOTE — ED Notes (Signed)
Pulmonologist at bedside

## 2017-12-29 NOTE — Procedures (Signed)
Intubation Procedure Note [name] [id number] [birth year]  Procedure: Intubation Indications: Airway protection and maintenance  Procedure Details Consent: Risks of procedure as well as the alternatives and risks of each were explained to the (patient/caregiver).  Consent for procedure obtained. Time Out: Verified patient identification, verified procedure, site/side was marked, verified correct patient position, special equipment/implants available, medications/allergies/relevent history reviewed, required imaging and test results available.  Performed  Maximum sterile technique was used including gloves, gown and hand hygiene.  Glide scope, blade 3, Grade 2 view. 1 attempt    Evaluation Hemodynamic Status: BP stable throughout; O2 sats: stable throughout Patient's Current Condition: stable Complications: No apparent complications Patient did tolerate procedure well. Chest X-ray ordered to verify placement.  CXR: tube position acceptable.   Jenna Stephens 12/29/2017

## 2017-12-29 NOTE — ED Notes (Signed)
Date and time results received: 12/29/17    Critical Value: potassium 2.7  Name of Provider Notified: hina, PA  Orders Received? Or Actions Taken?:none, acknowledged.

## 2017-12-29 NOTE — ED Notes (Signed)
ED TO INPATIENT HANDOFF REPORT  Name/Age/Gender Jenna Stephens 42 y.o. female  Code Status   Home/SNF/Other Home  Chief Complaint SOB  Level of Care/Admitting Diagnosis ED Disposition    ED Disposition Condition [facility] Area: [place] [[id number]]  Level of Care: Stepdown [14]  Admit to SDU based on following criteria: Respiratory Distress:  Frequent assessment and/or intervention to maintain adequate ventilation/respiration, pulmonary toilet, and respiratory treatment.  Diagnosis: Acute respiratory failure ([place]) [518.81.ICD-9-CM]  Admitting Physician: Alma Friendly [[id number]]  Attending Physician: Alma Friendly [[id number]]  Estimated length of stay: past midnight tomorrow  Certification:: I certify this patient will need inpatient services for at least 2 midnights  PT Class (Do Not Modify): Inpatient [101]  PT Acc Code (Do Not Modify): Private [1]       Medical History Past Medical History:  Diagnosis Date  . Hypertension     Allergies No Known Allergies  IV Location/Drains/Wounds Patient Lines/Drains/Airways Status   Active Line/Drains/Airways    Name:   Placement date:   Placement time:   Site:   Days:   Peripheral IV 12/29/17 Right Antecubital   12/29/17    0434    Antecubital   less than 1   Peripheral IV 12/29/17 Right Hand   12/29/17    0744    Hand   less than 1          Labs/Imaging Results for orders placed or performed during the hospital encounter of 12/29/17 (from the past 48 hour(s))  Basic metabolic panel     Status: Abnormal   Collection Time: 12/29/17  4:11 AM  Result Value Ref Range   Sodium 141 135 - 145 mmol/L   Potassium 2.7 (LL) 3.5 - 5.1 mmol/L    Comment: CRITICAL RESULT CALLED TO, READ BACK BY AND VERIFIED WITH: GIBSON,K AT 0446 ON 12/29/2017 BY MOSLEY,J    Chloride 104 98 - 111 mmol/L   CO2 25 22 - 32 mmol/L   Glucose, Bld 122 (H) 70 - 99 mg/dL   BUN 11 6 - 20 mg/dL   Creatinine, Ser 0.78 0.44 - 1.00 mg/dL   Calcium 9.0 8.9 - 10.3 mg/dL   GFR calc non Af Amer >60 >60 mL/min   GFR calc Af Amer >60 >60 mL/min    Comment: (NOTE) The eGFR has been calculated using the CKD EPI equation. This calculation has not been validated in all clinical situations. eGFR's persistently <60 mL/min signify possible Chronic Kidney Disease.    Anion gap 12 5 - 15    Comment: Performed at [facility], [place] [address], [place], [place] [address]  CBC with Differential     Status: Abnormal   Collection Time: 12/29/17  4:11 AM  Result Value Ref Range   WBC 19.7 (H) 4.0 - 10.5 K/uL   RBC 5.70 (H) 3.87 - 5.11 MIL/uL   Hemoglobin 11.6 (L) 12.0 - 15.0 g/dL   HCT 37.6 36.0 - 46.0 %   MCV 66.0 (L) 78.0 - 100.0 fL   MCH 20.4 (L) 26.0 - 34.0 pg   MCHC 30.9 30.0 - 36.0 g/dL   RDW 15.7 (H) 11.5 - 15.5 %   Platelets 337 150 - 400 K/uL   Neutrophils Relative % 63 %   Lymphocytes Relative 23 %   Monocytes Relative 5 %   Eosinophils Relative 9 %   Basophils Relative 0 %   Neutro Abs 12.4 (H) 1.7 - 7.7 K/uL  Lymphs Abs 4.5 (H) 0.7 - 4.0 K/uL   Monocytes Absolute 1.0 0.1 - 1.0 K/uL   Eosinophils Absolute 1.8 (H) 0.0 - 0.7 K/uL   Basophils Absolute 0.0 0.0 - 0.1 K/uL   RBC Morphology TARGET CELLS     Comment: POLYCHROMASIA PRESENT Performed at [facility] [address], [place], [place] [address]   Brain natriuretic peptide     Status: None   Collection Time: 12/29/17  4:26 AM  Result Value Ref Range   B Natriuretic Peptide 7.7 0.0 - 100.0 pg/mL    Comment: Performed at [facility], [place] [address], [place], [place] [address]  POCT i-Stat troponin I     Status: None   Collection Time: 12/29/17  4:44 AM  Result Value Ref Range   Troponin i, poc 0.02 0.00 - 0.08 ng/mL   Comment 3            Comment: Due to the release kinetics of cTnI, a negative result within the first hours of the onset of symptoms does not rule  out myocardial infarction with certainty. If myocardial infarction is still suspected, repeat the test at appropriate intervals.   Blood gas, venous     Status: Abnormal   Collection Time: 12/29/17  8:55 AM  Result Value Ref Range   pH, Ven 7.352 7.250 - 7.430   pCO2, Ven 38.1 (L) 44.0 - 60.0 mmHg   pO2, Ven 50.1 (H) 32.0 - 45.0 mmHg   Bicarbonate 20.5 20.0 - 28.0 mmol/L   Acid-base deficit 4.1 (H) 0.0 - 2.0 mmol/L   O2 Saturation 74.2 %   Patient temperature 98.6    Collection site VEIN    Drawn by DRAWN BY RN    Sample type VENOUS     Comment: Performed at [facility] [address], [place], [place] [address]   Ct Angio Chest Pe W/cm &/or Wo Cm  Result Date: 12/29/2017 CLINICAL DATA:  Shortness of breath and cough. EXAM: CT ANGIOGRAPHY CHEST WITH CONTRAST TECHNIQUE: Multidetector CT imaging of the chest was performed using the standard protocol during bolus administration of intravenous contrast. Multiplanar CT image reconstructions and MIPs were obtained to evaluate the vascular anatomy. CONTRAST:  The patient received 2 doses Isovue 370, 100 mL for each dose. COMPARISON:  Chest x-ray December 29, 2017 FINDINGS: Cardiovascular: The heart size is normal. No obvious coronary artery calcifications identified. The thoracic aorta demonstrates no aneurysm, dissection, or atherosclerotic change. Evaluation of pulmonary arteries is limited due to significant respiratory motion. The study was repeated with continued motion on the repeat study. No obvious pulmonary emboli identified. Evaluation is markedly limited beyond the main pulmonary arterial level, however. Mediastinum/Nodes: No enlarged mediastinal, hilar, or axillary lymph nodes. Thyroid gland, trachea, and esophagus demonstrate no significant findings. Lungs/Pleura: Central airways are normal. No pneumothorax. Evaluation is limited due to respiratory motion. Opacity in left apex demonstrates a cluster nodular appearance  well seen on series 11, images 36 and 37. Mild focal opacity in the lateral right upper lobe on series 11, image 58 is well. Mild ground-glass opacity in the right middle lobe on series 11, image 95a. No other nodules or masses identified. Upper Abdomen: No acute abnormality. Musculoskeletal: No chest wall abnormality. No acute or significant osseous findings. Review of the MIP images confirms the above findings. IMPRESSION: 1. The study is nearly nondiagnostic beyond the level of the main pulmonary arteries. Within the significant limitations, no pulmonary emboli are definitely identified.  If concern persists, a repeat study when the patient can better follow breathing directions or a V/Q scan could further evaluate. If another study is necessary today, I would recommend a V/Q scan as the patient has already received 2 contrast doses today. 2. Patchy nodular opacities in the left apex. Mild focal opacity in the lateral right lung. Findings are suspicious for an infectious or inflammatory process. Recommend short-term follow-up CT imaging to ensure resolution. Electronically Signed   By: Dorise Bullion III M.D   On: 12/29/2017 07:41   Dg Chest Port 1 View  Result Date: 12/29/2017 CLINICAL DATA:  Shortness of breath and cough EXAM: PORTABLE CHEST 1 VIEW COMPARISON:  Chest radiograph 10/10/2016 FINDINGS: The heart size and mediastinal contours are within normal limits. Both lungs are clear. The visualized skeletal structures are unremarkable. IMPRESSION: No active disease. Electronically Signed   By: Ulyses Jarred M.D.   On: 12/29/2017 05:56    Pending Labs Unresulted Labs (From admission, onward)    Start     Ordered   12/30/17 0500  Procalcitonin  Daily,   R     12/29/17 0818   12/29/17 0845  Culture, blood (routine x 2)  BLOOD CULTURE X 2,   R     12/29/17 0844   12/29/17 0819  Procalcitonin - Baseline  STAT,   STAT     12/29/17 0818   12/29/17 0818  Respiratory Panel by PCR  (Respiratory virus  panel)  Once,   R     12/29/17 0818   12/29/17 0818  Influenza panel by PCR (type A & B)  (Influenza PCR Panel)  Once,   R     12/29/17 0818   Signed and Held  HIV antibody (Routine Testing)  Once,   R     Signed and Held   Signed and Held  Basic metabolic panel  Tomorrow morning,   R     Signed and Held   Signed and Held  CBC  Tomorrow morning,   R     Signed and Held          Vitals/Pain Today's Vitals   12/29/17 0745 12/29/17 0845 12/29/17 0900 12/29/17 0913  BP: (!) 161/113 (!) 181/108 (!) 168/113   Pulse: (!) 128 (!) 123 (!) 124   Resp: (!) 25 (!) 23 (!) 22   Temp:      SpO2: 98% 96% 100% 98%  Weight:      Height:      PainSc:        Isolation Precautions Droplet precaution  Medications Medications  potassium chloride 10 mEq in 100 mL IVPB (10 mEq Intravenous New Bag/Given 12/29/17 0852)  lactated ringers bolus 2,000 mL (has no administration in time range)  methylPREDNISolone sodium succinate (SOLU-MEDROL) 125 mg/2 mL injection 60 mg (has no administration in time range)  cloNIDine (CATAPRES) tablet 0.1 mg (has no administration in time range)  rOPINIRole (REQUIP) tablet 2 mg (has no administration in time range)  sertraline (ZOLOFT) tablet 50 mg (has no administration in time range)  zolpidem (AMBIEN) tablet 5 mg (has no administration in time range)  cefTRIAXone (ROCEPHIN) 1 g in sodium chloride 0.9 % 100 mL IVPB (has no administration in time range)  azithromycin (ZITHROMAX) 500 mg in sodium chloride 0.9 % 250 mL IVPB (has no administration in time range)  levalbuterol (XOPENEX) nebulizer solution 0.63 mg (0.63 mg Nebulization Given 12/29/17 0911)  levalbuterol (XOPENEX) nebulizer solution 0.63 mg (has no administration in time  range)  albuterol (PROVENTIL) (2.5 MG/3ML) 0.083% nebulizer solution 5 mg (5 mg Nebulization Given 12/29/17 0400)  ipratropium-albuterol (DUONEB) 0.5-2.5 (3) MG/3ML nebulizer solution 3 mL (3 mLs Nebulization Given 12/29/17 0412)  albuterol  (PROVENTIL,VENTOLIN) solution continuous neb (10 mg/hr Nebulization Given 12/29/17 0454)  magnesium sulfate IVPB 2 g 50 mL (0 g Intravenous Stopped 12/29/17 0720)  iopamidol (ISOVUE-370) 76 % injection 100 mL (100 mLs Intravenous Contrast Given 12/29/17 0652)  sodium chloride 0.9 % injection (  Given by Other 12/29/17 0719)  methylPREDNISolone sodium succinate (SOLU-MEDROL) 125 mg/2 mL injection 125 mg (125 mg Intravenous Given 12/29/17 0642)  ketamine 50 mg in normal saline 5 mL (10 mg/mL) syringe (35 mg Intravenous Given 12/29/17 0654)    Mobility walks

## 2017-12-29 NOTE — ED Triage Notes (Signed)
Pt arrives with c/o shortness of breath and cough since Tuesday, denies hx of asthma. Denies fevers.  Pt has wheezing throughout.

## 2017-12-29 NOTE — Progress Notes (Signed)
eLink Physician-Brief Progress Note Patient Name: Jenna Stephens DOB: [birth year] MRN: [id number]   Date of Service  12/29/2017  HPI/Events of Note  ABG on 60%/PRVC 24/TV 370/P 10 = 7.07/54.4/283/283/15.2  eICU Interventions  Will order: 1. NMB per protocol with cisatracurium.  2. NaHCO3 200 meq IV now.  3. NaHCO3 IV infusion at 75 mL/hour.      Intervention Category Major Interventions: Acid-Base disturbance - evaluation and management;Respiratory failure - evaluation and management  Lysle Dingwall 12/29/2017, 8:27 PM

## 2017-12-29 NOTE — ED Notes (Signed)
Notified RT to evaluate patient per dr. Kathrynn Humble

## 2017-12-29 NOTE — Procedures (Signed)
Central Venous Catheter Insertion Procedure Note MIKIYAH GLASNER [id number] [birth year]  Procedure: Insertion of Central Venous Catheter Indications: Drug and/or fluid administration  Procedure Details Consent: Risks of procedure as well as the alternatives and risks of each were explained to the (patient/caregiver).  Consent for procedure obtained. Time Out: Verified patient identification, verified procedure, site/side was marked, verified correct patient position, special equipment/implants available, medications/allergies/relevent history reviewed, required imaging and test results available.  Performed  Maximum sterile technique was used including antiseptics, cap, gloves, gown, hand hygiene, mask and sheet. Skin prep: Chlorhexidine; local anesthetic administered A antimicrobial bonded/coated triple lumen catheter was placed in the right internal jugular vein using the Seldinger technique.  Evaluation Blood flow good Complications: No apparent complications Patient did tolerate procedure well. Chest X-ray ordered to verify placement.  CXR: normal.  Lyn Deemer 12/29/2017, 3:47 PM

## 2017-12-29 NOTE — Progress Notes (Addendum)
PCCM interval note  Patient reexamined in the ICU.  To the course of the day she has become increasingly tachypneic, tachycardic with increased work of breathing Back on BiPAP.  Lactate noted to be increasing to 6.8  Blood pressure (!) 168/113, pulse (!) 124, temperature (!) 97.4 F (36.3 C), temperature source Axillary, resp. rate (!) 22, height (S) 5\' 7"  (1.702 m), weight (S) 111 kg, last menstrual period 11/24/2017, SpO2 100 %, unknown if currently breastfeeding. Gen:      Severe respiratory distress HEENT:  EOMI, sclera anicteric Neck:     No masses; no thyromegaly Lungs:    Expiratory wheeze, diminished air entry, increased work of breathing with accessory muscle use.  CV:         Regular, tachycardic. Abd:      + bowel sounds; soft, non-tender; no palpable masses, no distension Ext:    No edema; adequate peripheral perfusion Skin:      Warm and dry; no rash Neuro: Agitated, no focal deficits.  Assessment/plan: Status asthmaticus.  Worsening respiratory status  Intubated.  Post intubation ABG reviewed-7.01/56/530/99%, mixed resp, metabolic acidosis Peak pressure 33, Plt pressure 20, auto PEEP noted on vent  Reduce tidal volume to 6 cc/kg.  Increase set PEEP to 10 Insp time decreased to 0.6 to reduce autopeep Continue steroids, nebs, singulair Follow ABG, LA.   1 L NS bolus. Place central line as she has poor IV access. Check CVP  The patient is critically ill with multiple organ system failure and requires high complexity decision making for assessment and support, frequent evaluation and titration of therapies, advanced monitoring, review of radiographic studies and interpretation of complex data.   Additional critical Care Time devoted to patient care services, exclusive of separately billable procedures, described in this note is 35  minutes.   Marshell Garfinkel MD [facility]  12/29/2017, 2:26 PM

## 2017-12-29 NOTE — Progress Notes (Signed)
eLink Physician-Brief Progress Note Patient Name: Jenna Stephens DOB: 1975-10-24 MRN: [id number]   Date of Service  12/29/2017  HPI/Events of Note  ABG on 60%/PRVC 24/TV 370/P 10 = 7.17/61.6/15621.9  eICU Interventions  Will order: 1. NaHCO3 100 meq IV now.  2. Repeat ABG at 5 AM.      Intervention Category Major Interventions: Acid-Base disturbance - evaluation and management;Respiratory failure - evaluation and management  Random Dobrowski Eugene 12/29/2017, 10:18 PM

## 2017-12-29 NOTE — ED Notes (Signed)
Report given to Ginger, Therapist, sports. RN aware both blood cultures have been drawn and second potassium in progress. Not able to start LR due to potassium running in same arm as other IV.

## 2017-12-30 ENCOUNTER — Inpatient Hospital Stay (HOSPITAL_COMMUNITY): Payer: Medicaid Other

## 2017-12-30 DIAGNOSIS — J45902 Unspecified asthma with status asthmaticus: Secondary | ICD-10-CM

## 2017-12-30 LAB — CBC
HCT: 30 % — ABNORMAL LOW (ref 36.0–46.0)
Hemoglobin: 8.9 g/dL — ABNORMAL LOW (ref 12.0–15.0)
MCH: 20.3 pg — AB (ref 26.0–34.0)
MCHC: 29.7 g/dL — ABNORMAL LOW (ref 30.0–36.0)
MCV: 68.5 fL — ABNORMAL LOW (ref 78.0–100.0)
PLATELETS: 297 10*3/uL (ref 150–400)
RBC: 4.38 MIL/uL (ref 3.87–5.11)
RDW: 16.1 % — ABNORMAL HIGH (ref 11.5–15.5)
WBC: 19.6 10*3/uL — ABNORMAL HIGH (ref 4.0–10.5)

## 2017-12-30 LAB — BLOOD GAS, ARTERIAL
ACID-BASE EXCESS: 0.7 mmol/L (ref 0.0–2.0)
Acid-Base Excess: 1 mmol/L (ref 0.0–2.0)
Acid-Base Excess: 0 mmol/L (ref 0.0–2.0)
Acid-Base Excess: 4.7 mmol/L — ABNORMAL HIGH (ref 0.0–2.0)
BICARBONATE: 29.5 mmol/L — AB (ref 20.0–28.0)
Bicarbonate: 27.3 mmol/L (ref 20.0–28.0)
Bicarbonate: 27.2 mmol/L (ref 20.0–28.0)
Bicarbonate: 31.7 mmol/L — ABNORMAL HIGH (ref 20.0–28.0)
DRAWN BY: 295031
Drawn by: 308601
Drawn by: 225631
FIO2: 40
FIO2: 40
FIO2: 40
FIO2: 40
LHR: 14 {breaths}/min
MECHVT: 370 mL
MECHVT: 0.37 mL
O2 SAT: 96.1 %
O2 Saturation: 95.3 %
O2 Saturation: 96.8 %
O2 Saturation: 97.3 %
PATIENT TEMPERATURE: 98.6
PATIENT TEMPERATURE: 98.6
PCO2 ART: 58.7 mmHg — AB (ref 32.0–48.0)
PEEP/CPAP: 10 cmH2O
PEEP/CPAP: 10 cmH2O
PEEP/CPAP: 10 cmH2O
PEEP: 10 cmH2O
PH ART: 7.289 — AB (ref 7.350–7.450)
PH ART: 7.254 — AB (ref 7.350–7.450)
PO2 ART: 93.7 mmHg (ref 83.0–108.0)
PO2 ART: 92.7 mmHg (ref 83.0–108.0)
Patient temperature: 98.6
Patient temperature: 98.6
RATE: 24 resp/min
RATE: 24 resp/min
RATE: 24 resp/min
VT: 370 mL
VT: 370 mL
pCO2 arterial: 77.7 mmHg (ref 32.0–48.0)
pCO2 arterial: 63.6 mmHg — ABNORMAL HIGH (ref 32.0–48.0)
pCO2 arterial: 67.6 mmHg (ref 32.0–48.0)
pH, Arterial: 7.204 — ABNORMAL LOW (ref 7.350–7.450)
pH, Arterial: 7.293 — ABNORMAL LOW (ref 7.350–7.450)
pO2, Arterial: 84.4 mmHg (ref 83.0–108.0)
pO2, Arterial: 84.5 mmHg (ref 83.0–108.0)

## 2017-12-30 LAB — RESPIRATORY PANEL BY PCR
ADENOVIRUS-RVPPCR: NOT DETECTED
Bordetella pertussis: NOT DETECTED
CORONAVIRUS 229E-RVPPCR: NOT DETECTED
CORONAVIRUS NL63-RVPPCR: NOT DETECTED
CORONAVIRUS OC43-RVPPCR: NOT DETECTED
Chlamydophila pneumoniae: NOT DETECTED
Coronavirus HKU1: NOT DETECTED
INFLUENZA B-RVPPCR: NOT DETECTED
Influenza A: NOT DETECTED
METAPNEUMOVIRUS-RVPPCR: NOT DETECTED
MYCOPLASMA PNEUMONIAE-RVPPCR: NOT DETECTED
PARAINFLUENZA VIRUS 1-RVPPCR: NOT DETECTED
PARAINFLUENZA VIRUS 2-RVPPCR: NOT DETECTED
Parainfluenza Virus 3: NOT DETECTED
Parainfluenza Virus 4: NOT DETECTED
RESPIRATORY SYNCYTIAL VIRUS-RVPPCR: NOT DETECTED
Rhinovirus / Enterovirus: DETECTED — AB

## 2017-12-30 LAB — BASIC METABOLIC PANEL
Anion gap: 8 (ref 5–15)
Anion gap: 9 (ref 5–15)
BUN: 10 mg/dL (ref 6–20)
BUN: 11 mg/dL (ref 6–20)
CALCIUM: 7.5 mg/dL — AB (ref 8.9–10.3)
CO2: 26 mmol/L (ref 22–32)
CO2: 27 mmol/L (ref 22–32)
CREATININE: 1.17 mg/dL — AB (ref 0.44–1.00)
CREATININE: 1.66 mg/dL — AB (ref 0.44–1.00)
Calcium: 7.4 mg/dL — ABNORMAL LOW (ref 8.9–10.3)
Chloride: 107 mmol/L (ref 98–111)
Chloride: 109 mmol/L (ref 98–111)
GFR calc Af Amer: 44 mL/min — ABNORMAL LOW (ref >60)
GFR calc Af Amer: >60 mL/min (ref >60)
GFR calc non Af Amer: 38 mL/min — ABNORMAL LOW (ref >60)
GFR, EST NON AFRICAN AMERICAN: 57 mL/min — AB (ref >60)
GLUCOSE: 167 mg/dL — AB (ref 70–99)
Glucose, Bld: 173 mg/dL — ABNORMAL HIGH (ref 70–99)
Potassium: 4.1 mmol/L (ref 3.5–5.1)
Potassium: 4.5 mmol/L (ref 3.5–5.1)
SODIUM: 143 mmol/L (ref 135–145)
Sodium: 143 mmol/L (ref 135–145)

## 2017-12-30 LAB — LACTIC ACID, PLASMA
LACTIC ACID, VENOUS: 1.8 mmol/L (ref 0.5–1.9)
LACTIC ACID, VENOUS: 3.2 mmol/L — AB (ref 0.5–1.9)
Lactic Acid, Venous: 1.6 mmol/L (ref 0.5–1.9)

## 2017-12-30 LAB — CK TOTAL AND CKMB (NOT AT ARMC)
CK TOTAL: 711 U/L — AB (ref 38–234)
CK, MB: 21.2 ng/mL — AB (ref 0.5–5.0)
RELATIVE INDEX: 3 — AB (ref 0.0–2.5)

## 2017-12-30 LAB — GLUCOSE, CAPILLARY
GLUCOSE-CAPILLARY: 179 mg/dL — AB (ref 70–99)
GLUCOSE-CAPILLARY: 192 mg/dL — AB (ref 70–99)
GLUCOSE-CAPILLARY: 154 mg/dL — AB (ref 70–99)
GLUCOSE-CAPILLARY: 146 mg/dL — AB (ref 70–99)
Glucose-Capillary: 145 mg/dL — ABNORMAL HIGH (ref 70–99)

## 2017-12-30 LAB — PROCALCITONIN

## 2017-12-30 LAB — HIV ANTIBODY (ROUTINE TESTING W REFLEX): HIV Screen 4th Generation wRfx: NONREACTIVE

## 2017-12-30 LAB — PHOSPHORUS: PHOSPHORUS: 2 mg/dL — AB (ref 2.5–4.6)

## 2017-12-30 LAB — MAGNESIUM: MAGNESIUM: 2 mg/dL (ref 1.7–2.4)

## 2017-12-30 LAB — CK: Total CK: 2235 U/L — ABNORMAL HIGH (ref 38–234)

## 2017-12-30 MED ORDER — LACTATED RINGERS IV BOLUS: 1000.0000 mL | Freq: Once | INTRAVENOUS | Status: DC

## 2017-12-30 MED ORDER — SODIUM CHLORIDE 0.9 % IV SOLN
INTRAVENOUS | Status: DC | PRN
  Administered 2018-01-05: 09:00:00 via INTRAVENOUS

## 2017-12-30 MED ORDER — MIDAZOLAM HCL 2 MG/2ML IJ SOLN
1.0000 mg | INTRAMUSCULAR | Status: DC | PRN
  Administered 2018-01-01: 2 mg via INTRAVENOUS
  Administered 2018-01-02: 4 mg via INTRAVENOUS
  Administered 2018-01-02 – 2018-01-03 (×3): 2 mg via INTRAVENOUS
  Filled 2017-12-30: qty 2
  Filled 2017-12-30: qty 4
  Filled 2017-12-30: qty 2
  Filled 2017-12-30: qty 4

## 2017-12-30 MED ORDER — CHLORHEXIDINE GLUCONATE 0.12% ORAL RINSE (MEDLINE KIT)
15.0000 mL | Freq: Two times a day (BID) | OROMUCOSAL | Status: DC
  Administered 2017-12-30 – 2018-01-05 (×12): 15 mL via OROMUCOSAL

## 2017-12-30 MED ORDER — ADULT MULTIVITAMIN LIQUID CH
15.0000 mL | Freq: Every day | ORAL | Status: DC
  Administered 2017-12-30 – 2018-01-03 (×5): 15 mL
  Filled 2017-12-30 (×5): qty 15

## 2017-12-30 MED ORDER — VITAL HIGH PROTEIN PO LIQD
1000.0000 mL | ORAL | Status: DC
  Administered 2017-12-30 – 2017-12-31 (×2): 1000 mL

## 2017-12-30 MED ORDER — PANTOPRAZOLE SODIUM 40 MG PO PACK
40.0000 mg | PACK | Freq: Every day | ORAL | Status: DC
  Administered 2017-12-30 – 2018-01-03 (×6): 40 mg
  Filled 2017-12-30 (×5): qty 20

## 2017-12-30 MED ORDER — POTASSIUM PHOSPHATES 15 MMOLE/5ML IV SOLN
10.0000 mmol | Freq: Once | INTRAVENOUS | Status: AC
  Administered 2017-12-30: 10 mmol via INTRAVENOUS
  Filled 2017-12-30: qty 3.33

## 2017-12-30 MED ORDER — SODIUM CHLORIDE 0.9 % IV SOLN
0.5000 mg/h | INTRAVENOUS | Status: DC
  Administered 2017-12-30: 0.5 mg/h via INTRAVENOUS
  Administered 2017-12-31: 5 mg/h via INTRAVENOUS
  Administered 2017-12-31 – 2018-01-01 (×2): 3.5 mg/h via INTRAVENOUS
  Filled 2017-12-30 (×5): qty 10

## 2017-12-30 MED ORDER — INSULIN ASPART 100 UNIT/ML ~~LOC~~ SOLN
2.0000 [IU] | SUBCUTANEOUS | Status: DC
  Administered 2017-12-30 – 2017-12-31 (×4): 4 [IU] via SUBCUTANEOUS
  Administered 2017-12-31: 2 [IU] via SUBCUTANEOUS
  Administered 2017-12-31: 6 [IU] via SUBCUTANEOUS
  Administered 2017-12-31 (×3): 2 [IU] via SUBCUTANEOUS
  Administered 2017-12-31: 4 [IU] via SUBCUTANEOUS
  Administered 2018-01-01 (×2): 2 [IU] via SUBCUTANEOUS
  Administered 2018-01-01: 6 [IU] via SUBCUTANEOUS
  Administered 2018-01-01: 2 [IU] via SUBCUTANEOUS
  Administered 2018-01-01: 6 [IU] via SUBCUTANEOUS
  Administered 2018-01-02: 2 [IU] via SUBCUTANEOUS
  Administered 2018-01-02 (×3): 4 [IU] via SUBCUTANEOUS
  Administered 2018-01-02: 6 [IU] via SUBCUTANEOUS
  Administered 2018-01-02: 2 [IU] via SUBCUTANEOUS
  Administered 2018-01-03 (×3): 4 [IU] via SUBCUTANEOUS
  Administered 2018-01-03: 2 [IU] via SUBCUTANEOUS
  Administered 2018-01-03: 4 [IU] via SUBCUTANEOUS
  Administered 2018-01-04: 2 [IU] via SUBCUTANEOUS
  Administered 2018-01-04 (×6): 4 [IU] via SUBCUTANEOUS
  Administered 2018-01-05: 2 [IU] via SUBCUTANEOUS
  Administered 2018-01-05: 4 [IU] via SUBCUTANEOUS
  Administered 2018-01-05: 6 [IU] via SUBCUTANEOUS
  Administered 2018-01-05 – 2018-01-06 (×3): 4 [IU] via SUBCUTANEOUS
  Administered 2018-01-06 (×2): 2 [IU] via SUBCUTANEOUS

## 2017-12-30 MED ORDER — PRO-STAT SUGAR FREE PO LIQD
60.0000 mL | Freq: Three times a day (TID) | ORAL | Status: DC
  Administered 2017-12-30 – 2018-01-01 (×7): 60 mL
  Filled 2017-12-30 (×7): qty 60

## 2017-12-30 MED ORDER — ORAL CARE MOUTH RINSE
15.0000 mL | OROMUCOSAL | Status: DC
  Administered 2017-12-30 – 2018-01-03 (×37): 15 mL via OROMUCOSAL

## 2017-12-30 MED ORDER — LACTATED RINGERS IV BOLUS
2000.0000 mL | Freq: Once | INTRAVENOUS | Status: AC
  Administered 2017-12-30: 2000 mL via INTRAVENOUS

## 2017-12-30 MED ORDER — MAGNESIUM SULFATE IN D5W 1-5 GM/100ML-% IV SOLN
1.0000 g | Freq: Once | INTRAVENOUS | Status: AC
  Administered 2017-12-30: 1 g via INTRAVENOUS
  Filled 2017-12-30: qty 100
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
## 2017-12-30 NOTE — Progress Notes (Signed)
   Has remained wheezy and acidotic. Did not tolerate RR drop despite bic gtt. So back on RR to 26  . ABG trends below.   CK 711 on 12/30/17  Recent Labs  Lab 12/29/17 1101  12/29/17 1810 12/29/17 2359 12/30/17 0409  LATICACIDVEN  --    < > 5.6* 3.2* 1.8  PROCALCITON <0.10  --   --   --  <0.10   < > = values in this interval not displayed.      Recent Labs  Lab 12/29/17 2154 12/30/17 0405 12/30/17 1050 12/30/17 1210 12/30/17 1345  PHART 7.172* 7.289* 7.204* 7.181* 7.254*  PCO2ART 61.6* 58.7* 77.7* 76.7* 63.6*  PO2ART 156* 84.4 84.5 85.1 93.7  HCO3 21.9 27.3 29.5* 27.6 27.2  O2SAT 98.8 96.1 95.3 94.8 96.8   Plan - Fluid bolus 2L  - recheck ABG 9pm with lactate and CK - dc diprivan (high CK)     SIGNATURE    Dr. Brand Males, M.D., F.C.C.P,  Pulmonary and Critical Care Medicine Staff Physician, [place] Director - Interstitial Lung Disease  Program  Pulmonary [place] [facility] [place], [place], [address]  Pager: [phone], If no answer or between  15:00h - 7:00h: call [phone] Telephone: [phone]  5:40 PM 12/30/2017

## 2017-12-30 NOTE — Care Management (Signed)
Evaluation and Treatment Return to top of Respiratory Failure GRG - GRG  Common treatments and tests include(2)(3)(4)(5)(6)(7)(8)(13)(14): ? Mechanical ventilation(11)(12) ? Bronchodilators, steroids, and chest physiotherapy(25) ? Antibiotics ? Tracheal or bronchial suctioning ? ABG, chest x-ray, oximetry, and chest CT scan ? Laboratory tests, including chemistries, toxicology screen, drug levels, and serologic testing as indicated(26) ? Repeated measures of weaning parameters and respiratory muscle strength(26)(27)(28)(29) ? Weaning trials in ICU or intermediate care unit(27)(28)(29)(30) ? Noninvasive ventilation (eg, to avoid intubation or to facilitate weaning from mechanical ventilation)(4)(5)(10)(31)(32) ? Weaning barriers assessment[E]  Commonly scheduled interventions include(2)(3)(6)(7)(8)(9)(14): ? Pulmonary consultation ? Surgical consultation if thoracoscopic lung biopsy or other procedure is needed(15) ? Bronchoscopy ? Echocardiogram(33) ? Indirect calorimetry ? Thoracentesis or chest tube ? Tracheostomy placement[F](35) ? Prone positioning(11)(13) ? Nuclear lung studies ? Electromyography and nerve conduction studies(26) ? Extracorporeal membrane oxygenation(36)(37)(38) ? Palliative care consultation(18)(24)  Benchmark Length of Stay (BLOS): Access diagnosis and procedure code-specific BLOS via  Search functions. Discharge Criteria Return to top of Respiratory Failure GRG - GRG [Expand All / Collapse All]  Continued inpatient stay is needed until 1 or more of the following are present: ? Acceptable patient status for next level of care is achieved. ? ALL of the following are present:  Weaning assessment performed   Weaning trials attempted na    Ventilation status appropriate full vent support    Airway status acceptable intubated   Respiratory status acceptablevent    Stable chest findings yes    No chest tube, or status acceptable n/a     Neurologic status acceptable full iv sedation    Temperature status acceptable yes    No infection, or status acceptable yes    Activity level acceptable cbr    Intake acceptable n/a   iv nimbex, iv fentanyl,,iv albuterol continuous nebs, iv siod. Bicarb,Iv solu-medrola, iv zithromax, iv rocephin

## 2017-12-30 NOTE — Progress Notes (Signed)
eLink Physician-Brief Progress Note Patient Name: Jenna Stephens DOB: 1976/02/08 MRN: [id number]   Date of Service  12/30/2017  HPI/Events of Note  ABG on 40%/PRVC 24/TV 370/P 10 = 7.289/58.7/84.4/27.3.  eICU Interventions  Continue present ventilator management.      Intervention Category Major Interventions: Acid-Base disturbance - evaluation and management;Respiratory failure - evaluation and management  Ellary Casamento Cornelia Copa 12/30/2017, 5:40 AM

## 2017-12-30 NOTE — Progress Notes (Signed)
CRITICAL VALUE ALERT  Critical Value:  Lactic acid 3.2  Date & Time Notied:  12/30/17 @ 0015  Provider Notified:   Orders Received/Actions taken: elink is aware of high lactic acid. Repeat is trending down.

## 2017-12-30 NOTE — Progress Notes (Signed)
eLink Physician-Brief Progress Note Patient Name: CHELSYE SUHRE DOB: [birth year] MRN: [id number]   Date of Service  12/30/2017  HPI/Events of Note  Notified of need for stress ulcer prophylaxis.   eICU Interventions  Will order Protonix Suspension per tube now and Q day.      Intervention Category Intermediate Interventions: Best-practice therapies (e.g. DVT, beta blocker, etc.)  Sommer,Steven Eugene 12/30/2017, 6:07 AM

## 2017-12-30 NOTE — Progress Notes (Signed)
eLink Physician-Brief Progress Note Patient Name: Jenna Stephens DOB: [birth year] MRN: [id number]   Date of Service  12/30/2017  HPI/Events of Note  ABG on 40%/PRVC 24/TV 370/P 10 = 7.29/67.6/92.7  eICU Interventions  Continue present ventilator management.      Intervention Category Major Interventions: Respiratory failure - evaluation and management  Sommer,Steven Eugene 12/30/2017, 10:43 PM
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
## 2017-12-31 ENCOUNTER — Inpatient Hospital Stay (HOSPITAL_COMMUNITY): Payer: Medicaid Other

## 2017-12-31 LAB — CBC WITH DIFFERENTIAL/PLATELET
Basophils Absolute: 0 10*3/uL (ref 0.0–0.1)
Basophils Relative: 0 %
EOS PCT: 0 %
Eosinophils Absolute: 0 10*3/uL (ref 0.0–0.7)
HEMATOCRIT: 31.7 % — AB (ref 36.0–46.0)
HEMOGLOBIN: 9.1 g/dL — AB (ref 12.0–15.0)
LYMPHS ABS: 1.5 10*3/uL (ref 0.7–4.0)
Lymphocytes Relative: 6 %
MCH: 20.3 pg — AB (ref 26.0–34.0)
MCHC: 28.7 g/dL — AB (ref 30.0–36.0)
MCV: 70.8 fL — AB (ref 78.0–100.0)
MONO ABS: 0.8 10*3/uL (ref 0.1–1.0)
MONOS PCT: 3 %
NEUTROS ABS: 23.4 10*3/uL — AB (ref 1.7–7.7)
Neutrophils Relative %: 91 %
Platelets: 271 10*3/uL (ref 150–400)
RBC: 4.48 MIL/uL (ref 3.87–5.11)
RDW: 16.3 % — AB (ref 11.5–15.5)
WBC: 25.7 10*3/uL — AB (ref 4.0–10.5)

## 2017-12-31 LAB — BLOOD GAS, ARTERIAL
ACID-BASE EXCESS: 8 mmol/L — AB (ref 0.0–2.0)
ACID-BASE EXCESS: 8.4 mmol/L — AB (ref 0.0–2.0)
Acid-base deficit: 13 mmol/L — ABNORMAL HIGH (ref 0.0–2.0)
Acid-base deficit: 1.1 mmol/L (ref 0.0–2.0)
BICARBONATE: 34.8 mmol/L — AB (ref 20.0–28.0)
BICARBONATE: 34.9 mmol/L — AB (ref 20.0–28.0)
Bicarbonate: 16.6 mmol/L — ABNORMAL LOW (ref 20.0–28.0)
Bicarbonate: 27.6 mmol/L (ref 20.0–28.0)
DRAWN BY: 331471
DRAWN BY: 295031
Drawn by: 295031
Drawn by: 225631
FIO2: 100
FIO2: 40
FIO2: 40
FIO2: 40
LHR: 24 {breaths}/min
MECHVT: 370 mL
O2 SAT: 96.9 %
O2 Saturation: 99.6 %
O2 Saturation: 94.8 %
O2 Saturation: 96.3 %
PATIENT TEMPERATURE: 98.6
PCO2 ART: 56.5 mmHg — AB (ref 32.0–48.0)
PCO2 ART: 76.7 mmHg — AB (ref 32.0–48.0)
PCO2 ART: 63.5 mmHg — AB (ref 32.0–48.0)
PEEP/CPAP: 10 cmH2O
PEEP: 5 cmH2O
PEEP: 10 cmH2O
PEEP: 10 cmH2O
PH ART: 7.096 — AB (ref 7.350–7.450)
PH ART: 7.333 — AB (ref 7.350–7.450)
PH ART: 7.357 (ref 7.350–7.450)
PO2 ART: 87.1 mmHg (ref 83.0–108.0)
Patient temperature: 37
Patient temperature: 98.6
Patient temperature: 98
RATE: 26 resp/min
RATE: 16 resp/min
RATE: 24 resp/min
VT: 370 mL
VT: 370 mL
VT: 0.37 mL
pCO2 arterial: 67.5 mmHg (ref 32.0–48.0)
pH, Arterial: 7.181 — CL (ref 7.350–7.450)
pO2, Arterial: 530 mmHg — ABNORMAL HIGH (ref 83.0–108.0)
pO2, Arterial: 85.1 mmHg (ref 83.0–108.0)
pO2, Arterial: 86.6 mmHg (ref 83.0–108.0)

## 2017-12-31 LAB — GLUCOSE, CAPILLARY
GLUCOSE-CAPILLARY: 162 mg/dL — AB (ref 70–99)
GLUCOSE-CAPILLARY: 211 mg/dL — AB (ref 70–99)
Glucose-Capillary: 139 mg/dL — ABNORMAL HIGH (ref 70–99)
Glucose-Capillary: 140 mg/dL — ABNORMAL HIGH (ref 70–99)
Glucose-Capillary: 143 mg/dL — ABNORMAL HIGH (ref 70–99)
Glucose-Capillary: 157 mg/dL — ABNORMAL HIGH (ref 70–99)
Glucose-Capillary: 202 mg/dL — ABNORMAL HIGH (ref 70–99)

## 2017-12-31 LAB — STREP PNEUMONIAE URINARY ANTIGEN: Strep Pneumo Urinary Antigen: NEGATIVE

## 2017-12-31 LAB — PHOSPHORUS: PHOSPHORUS: 2.5 mg/dL (ref 2.5–4.6)

## 2017-12-31 LAB — HEPATIC FUNCTION PANEL
ALBUMIN: 3.2 g/dL — AB (ref 3.5–5.0)
ALT: 29 U/L (ref 0–44)
AST: 51 U/L — ABNORMAL HIGH (ref 15–41)
Alkaline Phosphatase: 48 U/L (ref 38–126)
BILIRUBIN INDIRECT: 0.5 mg/dL (ref 0.3–0.9)
Bilirubin, Direct: 0.1 mg/dL (ref 0.0–0.2)
TOTAL PROTEIN: 6.4 g/dL — AB (ref 6.5–8.1)
Total Bilirubin: 0.6 mg/dL (ref 0.3–1.2)

## 2017-12-31 LAB — RAPID URINE DRUG SCREEN, HOSP PERFORMED
AMPHETAMINES: NOT DETECTED
Barbiturates: NOT DETECTED
Benzodiazepines: POSITIVE — AB
Cocaine: NOT DETECTED
Opiates: NOT DETECTED
Tetrahydrocannabinol: POSITIVE — AB

## 2017-12-31 LAB — BASIC METABOLIC PANEL
Anion gap: 6 (ref 5–15)
BUN: 14 mg/dL (ref 6–20)
CHLORIDE: 109 mmol/L (ref 98–111)
CO2: 29 mmol/L (ref 22–32)
CREATININE: 0.61 mg/dL (ref 0.44–1.00)
Calcium: 6.5 mg/dL — ABNORMAL LOW (ref 8.9–10.3)
GFR calc Af Amer: >60 mL/min (ref >60)
GFR calc non Af Amer: >60 mL/min (ref >60)
Glucose, Bld: 147 mg/dL — ABNORMAL HIGH (ref 70–99)
Potassium: 3.6 mmol/L (ref 3.5–5.1)
SODIUM: 144 mmol/L (ref 135–145)

## 2017-12-31 LAB — CK TOTAL AND CKMB (NOT AT ARMC)
CK, MB: 13.6 ng/mL — ABNORMAL HIGH (ref 0.5–5.0)
Relative Index: 0.5 (ref 0.0–2.5)
Total CK: 2623 U/L — ABNORMAL HIGH (ref 38–234)

## 2017-12-31 LAB — PROCALCITONIN

## 2017-12-31 LAB — PREGNANCY, URINE: PREG TEST UR: NEGATIVE

## 2017-12-31 LAB — LACTIC ACID, PLASMA: LACTIC ACID, VENOUS: 1.4 mmol/L (ref 0.5–1.9)

## 2017-12-31 LAB — MAGNESIUM: Magnesium: 2.6 mg/dL — ABNORMAL HIGH (ref 1.7–2.4)

## 2017-12-31 MED ORDER — POTASSIUM CHLORIDE 20 MEQ/15ML (10%) PO SOLN
30.0000 meq | ORAL | Status: AC
  Administered 2017-12-31 (×2): 30 meq
  Filled 2017-12-31 (×2): qty 30

## 2017-12-31 MED ORDER — FUROSEMIDE 10 MG/ML IJ SOLN
20.0000 mg | Freq: Once | INTRAMUSCULAR | Status: AC
  Administered 2017-12-31: 20 mg via INTRAVENOUS
  Filled 2017-12-31: qty 2

## 2017-12-31 MED ORDER — METOPROLOL TARTRATE 5 MG/5ML IV SOLN
2.5000 mg | INTRAVENOUS | Status: DC | PRN
  Administered 2017-12-31: 2.5 mg via INTRAVENOUS
  Administered 2017-12-31 – 2018-01-01 (×5): 5 mg via INTRAVENOUS
  Filled 2017-12-31 (×7): qty 5

## 2017-12-31 MED ORDER — METHYLPREDNISOLONE SODIUM SUCC 125 MG IJ SOLR
60.0000 mg | Freq: Four times a day (QID) | INTRAMUSCULAR | Status: DC
  Administered 2017-12-31 – 2018-01-02 (×7): 60 mg via INTRAVENOUS
  Filled 2017-12-31 (×7): qty 2

## 2017-12-31 NOTE — Progress Notes (Signed)
  ATTESTATION & SIGNATURE   STAFF NOTE: I, Dr Ann Lions have personally reviewed patient's available data, including medical history, events of note, physical examination and test results as part of my evaluation. I have discussed with resident/NP and other care providers such as pharmacist, RN and RRT.  In addition,  I personally evaluated patient and elicited key findings of   S: remains itnubated and paralyzed. Vt liberalized from 6cc to 8cc./kg/IBW. Wheeze some better. Rhinovirus + Admit eos - high Results for Jenna Stephens, Jenna Stephens (MRN [id number]) as of 12/31/2017 14:42  Ref. Range 12/29/2017 04:11  Eosinophils Absolute Latest Ref Range: 0.0 - 0.7 K/uL 1.8 (H)    O: Obese Wheeze - some better.  Sedated and paralyzed  Recent Labs  Lab 12/29/17 0411 12/30/17 0409 12/31/17 0602  HGB 11.6* 8.9* 9.1*  HCT 37.6 30.0* 31.7*  WBC 19.7* 19.6* 25.7*  PLT 337 297 271   Recent Labs  Lab 12/29/17 0411 12/29/17 1810 12/29/17 2359 12/30/17 0409 12/31/17 0115 12/31/17 0602  NA 141 140 143 143 144  --   K 2.7* 5.3* 4.5 4.1 3.6  --   CL 104 110 109 107 109  --   CO2 25 18* 26 27 29   --   GLUCOSE 122* 270* 167* 173* 147*  --   BUN 11 7 10 11 14   --   CREATININE 0.78 1.44* 1.66* 1.17* 0.61  --   CALCIUM 9.0 7.8* 7.4* 7.5* 6.5*  --   MG  --   --   --  2.0  --  2.6*  PHOS  --   --   --  2.0*  --  2.5   Recent Labs  Lab 12/29/17 1101  12/30/17 0409 12/30/17 2051 12/31/17 0602  LATICACIDVEN  --    < > 1.8 1.6 1.4  PROCALCITON <0.10  --  <0.10  --  <0.10   < > = values in this interval not displayed.   Resp virus - RV + Urine strep neg Urine peg -eg  A: AE-asthma due to rhinovirus  - high likely to have asthma based on presentation hx even though no prior hx esp with eos being high  - blood allergy panel +  - no evidence of bacterial process  P:  Dc abx Agree with increased Vt to 8cc/kb/IBW -> so reduce RR to improve air trapping -> check ABG Continue deep sedation with nimbex  -> aim to dc nimbex 01/01/18 (48h) Lasi x 1 Reduce IV steroids     The patient is critically ill with multiple organ systems failure and requires high complexity decision making for assessment and support, frequent evaluation and titration of therapies, application of advanced monitoring technologies and extensive interpretation of multiple databases.   Critical Care Time devoted to patient care services described in this note is  30  Minutes. This time reflects time of care of this signee Dr Brand Males. This critical care time does not reflect procedure time, or teaching time or supervisory time of PA/NP/Med student/Med Resident etc but could involve care discussion time     Dr. Brand Males, M.D., [facility].C.P Pulmonary and Critical Care Medicine Staff Physician [facility] Pager: [phone], If no answer or between  15:00h - 7:00h: call [phone]  12/31/2017 2:50 PM

## 2017-12-31 NOTE — Care Management (Signed)
[id number] Jenna Stephens,bsn,rn3,ccm,/[phone]/remains on vent, a.line in place/

## 2017-12-31 NOTE — Progress Notes (Signed)
Called by RN for hypertension - SBP running 150-200's consistently.  Home medications reviewed: uses clonidine.  Labetalol on home med profile but reported as patient not taking.    Plan: Instructed RN to level A-Line monitoring  Continue sedation with paralytics  PRN hydralazine in place  Added PRN lopressor for SBP > 170  Continue ICU monitoring   Noe Gens, NP-C [facility] Pgr: [phone] or if no answer [phone] 12/31/2017, 9:24 AM

## 2017-12-31 NOTE — Progress Notes (Signed)
During the night we had some issues with the BP reading high, with the Aline especially. Anytime the patient was stimulated by voice or touch. Manual BP were more inline with the BP when not stimulated. We did give Hydralazine PRN.  We had no issues with the sedation or the paralytics during the night. The urine output was slow in the beginning of the night shift. Bladder scan showed urine and after flushing urine flow increased the rest of the night.

## 2017-12-31 NOTE — Progress Notes (Signed)
Attempted to change to 8cc/kg with rate of 16.  Pt did not tolerate due to increased peak pressures, air trapping on vent.  Additionally, she had episode of PVC's, tachycardia.  Changed back to low Vt, rate of 24 with improvement in pressures.   Noe Gens, NP-C [facility] Pgr: [phone] or if no answer [phone] 12/31/2017, 5:02 PM

## 2017-12-31 NOTE — Progress Notes (Signed)
[facility] ADULT ICU REPLACEMENT PROTOCOL FOR AM LAB REPLACEMENT ONLY  The patient does apply for the [facility] Adult ICU Electrolyte Replacment Protocol based on the criteria listed below:   1. Is GFR >/= 40 ml/min? Yes.    Patient's GFR today is >60 2. Is urine output >/= 0.5 ml/kg/hr for the last 6 hours? Yes.   Patient's UOP is 1.75 ml/kg/hr 3. Is BUN < 60 mg/dL? Yes.    Patient's BUN today is 14 4. Abnormal electrolyte  K 3.6 5. Ordered repletion with: per protocol 6. If a panic level lab has been reported, has the CCM MD in charge been notified? Yes.  .   Physician:  Illene Labrador, Canary Brim 12/31/2017 6:12 AM
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
## 2018-01-01 ENCOUNTER — Inpatient Hospital Stay (HOSPITAL_COMMUNITY): Payer: Medicaid Other

## 2018-01-01 LAB — ALLERGENS W/TOTAL IGE AREA 2
Bermuda Grass IgE: 0.51 kU/L — AB
Cat Dander IgE: 0.24 kU/L — AB
Cedar, Mountain IgE: 1.18 kU/L — AB
Cockroach, German IgE: 0.29 kU/L — AB
Cottonwood IgE: 0.42 kU/L — AB
D Farinae IgE: 0.12 kU/L — AB
D Pteronyssinus IgE: <0.1 kU/L
Dog Dander IgE: 0.43 kU/L — AB
Elm, American IgE: 0.35 kU/L — AB
G010-IGE JOHNSON GRASS: 0.62 kU/L — AB
IgE (Immunoglobulin E), Serum: 214 IU/mL (ref 6–495)
MOUSE URINE IGE: 1.74 kU/L — AB
Oak, White IgE: 0.44 kU/L — AB
Pecan, Hickory IgE: 0.17 kU/L — AB
Penicillium Chrysogen IgE: 0.11 kU/L — AB
SHEEP SORREL IGE QN: 0.41 kU/L — AB
T001-IGE MAPLE/BOX ELDER: 0.38 kU/L — AB
T003-IGE COMMON SILVER BIRCH: 0.1 kU/L — AB
Timothy Grass IgE: 0.62 kU/L — AB
W001-IGE RAGWEED, SHORT: 0.36 kU/L — AB
W014-IGE PIGWEED, ROUGH: 0.36 kU/L — AB
WHITE MULBERRY IGE: 0.23 kU/L — AB

## 2018-01-01 LAB — CBC WITH DIFFERENTIAL/PLATELET
BASOS ABS: 0 10*3/uL (ref 0.0–0.1)
Basophils Relative: 0 %
EOS ABS: 0 10*3/uL (ref 0.0–0.7)
Eosinophils Relative: 0 %
HCT: 33.1 % — ABNORMAL LOW (ref 36.0–46.0)
Hemoglobin: 9.6 g/dL — ABNORMAL LOW (ref 12.0–15.0)
LYMPHS PCT: 4 %
Lymphs Abs: 1 10*3/uL (ref 0.7–4.0)
MCH: 20.5 pg — AB (ref 26.0–34.0)
MCHC: 29 g/dL — ABNORMAL LOW (ref 30.0–36.0)
MCV: 70.7 fL — ABNORMAL LOW (ref 78.0–100.0)
MONO ABS: 1.5 10*3/uL — AB (ref 0.1–1.0)
Monocytes Relative: 6 %
NEUTROS ABS: 22.3 10*3/uL — AB (ref 1.7–7.7)
Neutrophils Relative %: 90 %
PLATELETS: 328 10*3/uL (ref 150–400)
RBC: 4.68 MIL/uL (ref 3.87–5.11)
RDW: 16.5 % — AB (ref 11.5–15.5)
WBC: 24.8 10*3/uL — ABNORMAL HIGH (ref 4.0–10.5)

## 2018-01-01 LAB — BLOOD GAS, ARTERIAL
ACID-BASE EXCESS: 4.9 mmol/L — AB (ref 0.0–2.0)
Acid-Base Excess: 3.6 mmol/L — ABNORMAL HIGH (ref 0.0–2.0)
Acid-Base Excess: 5.3 mmol/L — ABNORMAL HIGH (ref 0.0–2.0)
BICARBONATE: 31.8 mmol/L — AB (ref 20.0–28.0)
BICARBONATE: 30.7 mmol/L — AB (ref 20.0–28.0)
Bicarbonate: 30.5 mmol/L — ABNORMAL HIGH (ref 20.0–28.0)
DRAWN BY: 270211
Drawn by: 33147
Drawn by: 331471
FIO2: 40
FIO2: 0.4
FIO2: 40
LHR: 24 {breaths}/min
LHR: 24 {breaths}/min
MECHVT: 370 mL
MECHVT: 370 mL
O2 SAT: 93.2 %
O2 Saturation: 90.9 %
O2 Saturation: 96.2 %
PATIENT TEMPERATURE: 98.6
PCO2 ART: 64.4 mmHg — AB (ref 32.0–48.0)
PCO2 ART: 53.2 mmHg — AB (ref 32.0–48.0)
PEEP/CPAP: 0.8 cmH2O
PEEP/CPAP: 8 cmH2O
PEEP: 8 cmH2O
PH ART: 7.296 — AB (ref 7.350–7.450)
PO2 ART: 74.7 mmHg — AB (ref 83.0–108.0)
PO2 ART: 68.2 mmHg — AB (ref 83.0–108.0)
PO2 ART: 84.4 mmHg (ref 83.0–108.0)
Patient temperature: 98.6
Patient temperature: 98.6
RATE: 24 resp/min
VT: 370 mL
pCO2 arterial: 64.6 mmHg — ABNORMAL HIGH (ref 32.0–48.0)
pH, Arterial: 7.315 — ABNORMAL LOW (ref 7.350–7.450)
pH, Arterial: 7.38 (ref 7.350–7.450)

## 2018-01-01 LAB — BASIC METABOLIC PANEL
Anion gap: 7 (ref 5–15)
BUN: 26 mg/dL — AB (ref 6–20)
CALCIUM: 8.1 mg/dL — AB (ref 8.9–10.3)
CO2: 32 mmol/L (ref 22–32)
CREATININE: 0.58 mg/dL (ref 0.44–1.00)
Chloride: 103 mmol/L (ref 98–111)
GFR calc non Af Amer: >60 mL/min (ref >60)
Glucose, Bld: 164 mg/dL — ABNORMAL HIGH (ref 70–99)
Potassium: 4.3 mmol/L (ref 3.5–5.1)
Sodium: 142 mmol/L (ref 135–145)

## 2018-01-01 LAB — GLUCOSE, CAPILLARY
GLUCOSE-CAPILLARY: 214 mg/dL — AB (ref 70–99)
Glucose-Capillary: 142 mg/dL — ABNORMAL HIGH (ref 70–99)
Glucose-Capillary: 143 mg/dL — ABNORMAL HIGH (ref 70–99)
Glucose-Capillary: 132 mg/dL — ABNORMAL HIGH (ref 70–99)
Glucose-Capillary: 215 mg/dL — ABNORMAL HIGH (ref 70–99)

## 2018-01-01 LAB — MAGNESIUM: Magnesium: 3 mg/dL — ABNORMAL HIGH (ref 1.7–2.4)

## 2018-01-01 LAB — PHOSPHORUS: Phosphorus: 2.2 mg/dL — ABNORMAL LOW (ref 2.5–4.6)

## 2018-01-01 LAB — LEGIONELLA PNEUMOPHILA SEROGP 1 UR AG: L. PNEUMOPHILA SEROGP 1 UR AG: NEGATIVE

## 2018-01-01 MED ORDER — DEXMEDETOMIDINE HCL IN NACL 400 MCG/100ML IV SOLN
0.4000 ug/kg/h | INTRAVENOUS | Status: DC
  Administered 2018-01-01: 1 ug/kg/h via INTRAVENOUS
  Administered 2018-01-02 (×2): 1.2 ug/kg/h via INTRAVENOUS
  Administered 2018-01-02: 0.8 ug/kg/h via INTRAVENOUS
  Administered 2018-01-02: 0.4 ug/kg/h via INTRAVENOUS
  Administered 2018-01-03: 0.7 ug/kg/h via INTRAVENOUS
  Administered 2018-01-03: 0.6 ug/kg/h via INTRAVENOUS
  Filled 2018-01-01 (×9): qty 100

## 2018-01-01 MED ORDER — ENOXAPARIN SODIUM 60 MG/0.6ML ~~LOC~~ SOLN
60.0000 mg | SUBCUTANEOUS | Status: DC
  Administered 2018-01-01 – 2018-01-06 (×6): 60 mg via SUBCUTANEOUS
  Filled 2018-01-01 (×7): qty 0.6

## 2018-01-01 MED ORDER — DILTIAZEM LOAD VIA INFUSION
10.0000 mg | Freq: Once | INTRAVENOUS | Status: AC
  Administered 2018-01-01: 10 mg via INTRAVENOUS
  Filled 2018-01-01: qty 10

## 2018-01-01 MED ORDER — SODIUM CHLORIDE 0.9 % IV SOLN
0.5000 mg/h | INTRAVENOUS | Status: DC
  Administered 2018-01-01: 0.5 mg/h via INTRAVENOUS
  Filled 2018-01-01: qty 5

## 2018-01-01 MED ORDER — VITAL HIGH PROTEIN PO LIQD
1000.0000 mL | ORAL | Status: DC
  Administered 2018-01-01 – 2018-01-02 (×3): 1000 mL

## 2018-01-01 MED ORDER — POTASSIUM CHLORIDE 20 MEQ/15ML (10%) PO SOLN
40.0000 meq | Freq: Two times a day (BID) | ORAL | Status: DC
  Administered 2018-01-01 – 2018-01-06 (×11): 40 meq
  Filled 2018-01-01 (×11): qty 30

## 2018-01-01 MED ORDER — POTASSIUM PHOSPHATES 15 MMOLE/5ML IV SOLN
10.0000 mmol | Freq: Once | INTRAVENOUS | Status: AC
  Administered 2018-01-01: 10 mmol via INTRAVENOUS
  Filled 2018-01-01: qty 3.33

## 2018-01-01 MED ORDER — FUROSEMIDE 10 MG/ML IJ SOLN
80.0000 mg | Freq: Three times a day (TID) | INTRAMUSCULAR | Status: DC
  Administered 2018-01-01 – 2018-01-02 (×3): 80 mg via INTRAVENOUS
  Filled 2018-01-01 (×4): qty 8

## 2018-01-01 MED ORDER — HYDROMORPHONE HCL 1 MG/ML IJ SOLN
1.0000 mg | INTRAMUSCULAR | Status: DC | PRN
  Administered 2018-01-01: 4 mg via INTRAVENOUS
  Filled 2018-01-01: qty 4

## 2018-01-01 MED ORDER — NICARDIPINE HCL IN NACL 20-0.86 MG/200ML-% IV SOLN
5.0000 mg/h | INTRAVENOUS | Status: DC
  Filled 2018-01-01: qty 200

## 2018-01-01 MED ORDER — DILTIAZEM HCL-DEXTROSE 100-5 MG/100ML-% IV SOLN (PREMIX)
5.0000 mg/h | INTRAVENOUS | Status: AC
  Administered 2018-01-01: 5 mg/h via INTRAVENOUS
  Filled 2018-01-01 (×2): qty 100

## 2018-01-01 MED ORDER — DEXMEDETOMIDINE HCL IN NACL 200 MCG/50ML IV SOLN
0.4000 ug/kg/h | INTRAVENOUS | Status: DC
  Administered 2018-01-01: 1.2 ug/kg/h via INTRAVENOUS
  Administered 2018-01-01: 0.4 ug/kg/h via INTRAVENOUS
  Filled 2018-01-01: qty 100
  Filled 2018-01-01: qty 50
  Filled 2018-01-01: qty 100
  Filled 2018-01-01 (×4): qty 50

## 2018-01-01 NOTE — Progress Notes (Signed)
185 mL of Fentanyl wasted in sink with [employer] manager

## 2018-01-01 NOTE — Progress Notes (Signed)
eLink Physician-Brief Progress Note Patient Name: Jenna Stephens DOB: 1975/12/20 MRN: [id number]   Date of Service  01/01/2018  HPI/Events of Note  Hypertensive, tachycardic  eICU Interventions  Ditiazem infusion ordered     Intervention Category Major Interventions: Hypertension - evaluation and management;Other:  Wilhelmina Mcardle 01/01/2018, 9:47 PM

## 2018-01-01 NOTE — Progress Notes (Signed)
Nutrition Follow-up  DOCUMENTATION CODES:   Obesity unspecified  INTERVENTION:  - Will increase TF: Vital High Protein @ 60 mL/hr to provide 1440 kcal, 126 grams of protein, and 1204 mL free water. - Noted hypophosphatemia but do not feel that this is related to/indicative of refeeding.   NUTRITION DIAGNOSIS:   Inadequate oral intake related to inability to eat as evidenced by NPO status. -ongoing  GOAL:   Provide needs based on ASPEN/SCCM guidelines -to be met with TF regimen.   MONITOR:   Vent status, TF tolerance, Weight trends, Labs  ASSESSMENT:   42 year old with history of HTN. She presented to the ED with 3 day hx of acute onset dyspnea and wheezing. Patient has been dealing with upper respiratory tract infection/cold for the past few weeks with no known exposures.  Weight trending significant up since admission but also noted that weight on admission 9/8 (111 kg/244.71 lb) is flagged. At this time, will continue to use admission weight (which indicates obesity) to estimate needs but will re-assess if change is needed at follow-up.   Patient remains intubated, sedated with Propofol no longer being used, and OGT in place. Patient is receiving Vital High Protein @ 10 mL/hr with 60 mL Prostat TID. This regimen is providing 840 kcal, 111 grams of protein, and 201 mL free water.    Patient is currently intubated on ventilator support MV: 9.1 L/min Temp (24hrs), Avg:97.9 F (36.6 C), Min:97.5 F (36.4 C), Max:98.2 F (36.8 C) Propofol: none BP: 147/73 and MAP: 91  Medications reviewed; 20 mg IV Lasix x1 dose yesterday, 80 mg IV Lasix TID starting today, sliding scale Novolog, 15 mL liquid multivitamin per OGT/day, 30 mEq IV KCl x2 runs yesterday, 40 mEq IV KCl x1 run today, 10 mmol IV KPhos x1 dose today,  Labs reviewed; CBGs: 142 and 143 mg/dL today, BUN: 26 mg/dL, Ca: 8.1 mg/dL, Phos: 2.2 mg/dL, Mg: 3 mg/dL. IVF; 150 mEq sodium bicarb-sterile water @ 50 mL/hr.  Drips;  Versed @ 4 mg/hr, Fentanyl @ 300 mcg/hr, Precedex @ 0.9 mcg/kg/hr.     Diet Order:   Diet Order            Diet NPO time specified Except for: Ice Chips, Sips with Meds  Diet effective now              EDUCATION NEEDS:   No education needs have been identified at this time  Skin:  Skin Assessment: Reviewed RN Assessment  Last BM:  9/11  Height:   Ht Readings from Last 1 Encounters:  12/31/17 5' 9"  (1.753 m)    Weight:   Wt Readings from Last 1 Encounters:  01/01/18 134.3 kg    Ideal Body Weight:  61.36 kg  BMI:  Body mass index is 43.72 kg/m.  Estimated Nutritional Needs:   Kcal:  7829-5621  Protein:  >/= 123 grams  Fluid:  >/= 1.8 L/day     Jarome Matin, MS, RD, LDN, [facility] Inpatient Clinical Dietitian Pager # [phone] After hours/weekend pager # [phone]
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
## 2018-01-02 ENCOUNTER — Inpatient Hospital Stay (HOSPITAL_COMMUNITY): Payer: Medicaid Other

## 2018-01-02 LAB — GLUCOSE, CAPILLARY
GLUCOSE-CAPILLARY: 123 mg/dL — AB (ref 70–99)
GLUCOSE-CAPILLARY: 116 mg/dL — AB (ref 70–99)
Glucose-Capillary: 139 mg/dL — ABNORMAL HIGH (ref 70–99)
Glucose-Capillary: 170 mg/dL — ABNORMAL HIGH (ref 70–99)
Glucose-Capillary: 176 mg/dL — ABNORMAL HIGH (ref 70–99)
Glucose-Capillary: 185 mg/dL — ABNORMAL HIGH (ref 70–99)
Glucose-Capillary: 217 mg/dL — ABNORMAL HIGH (ref 70–99)

## 2018-01-02 LAB — BASIC METABOLIC PANEL
Anion gap: 9 (ref 5–15)
BUN: 35 mg/dL — AB (ref 6–20)
CALCIUM: 8.1 mg/dL — AB (ref 8.9–10.3)
CHLORIDE: 104 mmol/L (ref 98–111)
CO2: 30 mmol/L (ref 22–32)
CREATININE: 0.67 mg/dL (ref 0.44–1.00)
GFR calc non Af Amer: >60 mL/min (ref >60)
Glucose, Bld: 152 mg/dL — ABNORMAL HIGH (ref 70–99)
Potassium: 4.4 mmol/L (ref 3.5–5.1)
Sodium: 143 mmol/L (ref 135–145)

## 2018-01-02 LAB — CK TOTAL AND CKMB (NOT AT ARMC)
CK TOTAL: 2457 U/L — AB (ref 38–234)
CK, MB: 31.3 ng/mL — ABNORMAL HIGH (ref 0.5–5.0)
Relative Index: 1.3 (ref 0.0–2.5)

## 2018-01-02 LAB — HEPATIC FUNCTION PANEL
ALK PHOS: 45 U/L (ref 38–126)
ALT: 60 U/L — ABNORMAL HIGH (ref 0–44)
AST: 90 U/L — AB (ref 15–41)
Albumin: 3.4 g/dL — ABNORMAL LOW (ref 3.5–5.0)
BILIRUBIN DIRECT: 0.1 mg/dL (ref 0.0–0.2)
BILIRUBIN INDIRECT: 0.8 mg/dL (ref 0.3–0.9)
BILIRUBIN TOTAL: 0.9 mg/dL (ref 0.3–1.2)
Total Protein: 7.1 g/dL (ref 6.5–8.1)

## 2018-01-02 LAB — CBC WITH DIFFERENTIAL/PLATELET
BASOS ABS: 0 10*3/uL (ref 0.0–0.1)
Basophils Relative: 0 %
EOS PCT: 0 %
Eosinophils Absolute: 0 10*3/uL (ref 0.0–0.7)
HEMATOCRIT: 31 % — AB (ref 36.0–46.0)
HEMOGLOBIN: 9 g/dL — AB (ref 12.0–15.0)
LYMPHS ABS: 0.7 10*3/uL (ref 0.7–4.0)
LYMPHS PCT: 4 %
MCH: 20.5 pg — AB (ref 26.0–34.0)
MCHC: 29 g/dL — ABNORMAL LOW (ref 30.0–36.0)
MCV: 70.5 fL — AB (ref 78.0–100.0)
MONO ABS: 0.8 10*3/uL (ref 0.1–1.0)
MONOS PCT: 5 %
Neutro Abs: 15.6 10*3/uL — ABNORMAL HIGH (ref 1.7–7.7)
Neutrophils Relative %: 91 %
Platelets: 272 10*3/uL (ref 150–400)
RBC: 4.4 MIL/uL (ref 3.87–5.11)
RDW: 16.2 % — AB (ref 11.5–15.5)
WBC: 17.1 10*3/uL — AB (ref 4.0–10.5)

## 2018-01-02 LAB — MAGNESIUM: Magnesium: 2.8 mg/dL — ABNORMAL HIGH (ref 1.7–2.4)

## 2018-01-02 LAB — PHOSPHORUS: Phosphorus: 2.6 mg/dL (ref 2.5–4.6)

## 2018-01-02 MED ORDER — BISOPROLOL FUMARATE 5 MG PO TABS
10.0000 mg | ORAL_TABLET | Freq: Every day | ORAL | Status: DC
  Administered 2018-01-02 – 2018-01-03 (×2): 10 mg
  Filled 2018-01-02 (×2): qty 2

## 2018-01-02 MED ORDER — IPRATROPIUM BROMIDE 0.02 % IN SOLN
0.5000 mg | Freq: Four times a day (QID) | RESPIRATORY_TRACT | Status: DC
  Administered 2018-01-02 – 2018-01-05 (×14): 0.5 mg via RESPIRATORY_TRACT
  Filled 2018-01-02 (×13): qty 2.5

## 2018-01-02 MED ORDER — FUROSEMIDE 10 MG/ML IJ SOLN
80.0000 mg | Freq: Once | INTRAMUSCULAR | Status: AC
  Administered 2018-01-02: 80 mg via INTRAVENOUS
  Filled 2018-01-02: qty 8

## 2018-01-02 MED ORDER — FUROSEMIDE 10 MG/ML IJ SOLN
60.0000 mg | Freq: Three times a day (TID) | INTRAMUSCULAR | Status: DC
  Administered 2018-01-02 – 2018-01-03 (×4): 60 mg via INTRAVENOUS
  Filled 2018-01-02 (×4): qty 6

## 2018-01-02 MED ORDER — FENTANYL CITRATE (PF) 100 MCG/2ML IJ SOLN
50.0000 ug | INTRAMUSCULAR | Status: DC | PRN
  Administered 2018-01-03 (×3): 100 ug via INTRAVENOUS
  Filled 2018-01-02 (×3): qty 2

## 2018-01-02 MED ORDER — LIP MEDEX EX OINT
TOPICAL_OINTMENT | CUTANEOUS | Status: AC
  Administered 2018-01-02: 16:00:00
  Filled 2018-01-02: qty 7

## 2018-01-02 MED ORDER — CLONAZEPAM 0.5 MG PO TABS
0.5000 mg | ORAL_TABLET | Freq: Two times a day (BID) | ORAL | Status: DC
  Administered 2018-01-02 – 2018-01-03 (×3): 0.5 mg via ORAL
  Filled 2018-01-02 (×3): qty 1

## 2018-01-02 MED ORDER — METOPROLOL TARTRATE 5 MG/5ML IV SOLN
5.0000 mg | Freq: Once | INTRAVENOUS | Status: AC
  Administered 2018-01-02: 5 mg via INTRAVENOUS
  Filled 2018-01-02: qty 5

## 2018-01-02 MED ORDER — NICARDIPINE HCL IN NACL 20-0.86 MG/200ML-% IV SOLN
3.0000 mg/h | INTRAVENOUS | Status: DC
  Administered 2018-01-02 – 2018-01-03 (×5): 5 mg/h via INTRAVENOUS
  Filled 2018-01-02 (×5): qty 200

## 2018-01-02 MED ORDER — CLONIDINE HCL 0.2 MG PO TABS
0.2000 mg | ORAL_TABLET | Freq: Two times a day (BID) | ORAL | Status: DC
  Administered 2018-01-02 – 2018-01-06 (×9): 0.2 mg via ORAL
  Filled 2018-01-02 (×2): qty 1
  Filled 2018-01-02 (×2): qty 2
  Filled 2018-01-02: qty 1
  Filled 2018-01-02 (×2): qty 2
  Filled 2018-01-02: qty 1
  Filled 2018-01-02: qty 2

## 2018-01-02 MED ORDER — LABETALOL HCL 100 MG PO TABS: 100.0000 mg | ORAL_TABLET | Freq: Two times a day (BID) | ORAL | Status: DC

## 2018-01-02 MED ORDER — BISOPROLOL FUMARATE 5 MG PO TABS: 10.0000 mg | ORAL_TABLET | Freq: Every day | ORAL | Status: DC

## 2018-01-02 MED ORDER — LEVALBUTEROL HCL 1.25 MG/0.5ML IN NEBU
1.2500 mg | INHALATION_SOLUTION | Freq: Four times a day (QID) | RESPIRATORY_TRACT | Status: DC
  Administered 2018-01-02 – 2018-01-05 (×13): 1.25 mg via RESPIRATORY_TRACT
  Filled 2018-01-02 (×14): qty 0.5

## 2018-01-02 MED ORDER — FENTANYL CITRATE (PF) 100 MCG/2ML IJ SOLN
INTRAMUSCULAR | Status: AC
  Administered 2018-01-03: 100 ug via INTRAVENOUS
  Filled 2018-01-02: qty 2

## 2018-01-02 MED ORDER — METHYLPREDNISOLONE SODIUM SUCC 40 MG IJ SOLR
40.0000 mg | Freq: Four times a day (QID) | INTRAMUSCULAR | Status: DC
  Administered 2018-01-02 – 2018-01-06 (×16): 40 mg via INTRAVENOUS
  Filled 2018-01-02 (×16): qty 1

## 2018-01-02 NOTE — Progress Notes (Signed)
eLink Physician-Brief Progress Note Patient Name: Jenna Stephens DOB: August 17, 1975 MRN: [id number]   Date of Service  01/02/2018  HPI/Events of Note  Called by nurse patient very uncomfortable on ventilator and attempting to get out of bed.  eICU Interventions  Fentanyl 50-155mcg IV q 1 hr prn     Intervention Category Minor Interventions: Agitation / anxiety - evaluation and management  Mauri Brooklyn, P 01/02/2018, 10:44 PM

## 2018-01-02 NOTE — Progress Notes (Addendum)
   Re-round  Off precedex On cardene  Sitting Oriented Some deconditioned but good muscle tone Vt 2040mL on deep inspiration Feels ready for extubation Slow to answre but family feel she is fully oriented but seemed emotiona Upper lip more swollen than baseline per family but improved Doing SBT now BP still high On lasix - diuresing well   Plan Cuff leak done -> negative -> defer extubation 01/02/2018 Can do precredex for calm Change alb to xopenex due to tachycardia      SIGNATURE    Dr. Brand Males, M.D., F.C.C.P,  Pulmonary and Critical Care Medicine Staff Physician, [place] Director - Interstitial Lung Disease  Program  [facility] [place], [place], [address]  Pager: [phone], If no answer or between  15:00h - 7:00h: call [phone] Telephone: [phone]  2:16 PM 01/02/2018
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
## 2018-01-03 DIAGNOSIS — J45902 Unspecified asthma with status asthmaticus: Secondary | ICD-10-CM

## 2018-01-03 LAB — COMPREHENSIVE METABOLIC PANEL
ALT: 70 U/L — AB (ref 0–44)
ANION GAP: 12 (ref 5–15)
AST: 58 U/L — ABNORMAL HIGH (ref 15–41)
Albumin: 3.2 g/dL — ABNORMAL LOW (ref 3.5–5.0)
Alkaline Phosphatase: 44 U/L (ref 38–126)
BUN: 40 mg/dL — ABNORMAL HIGH (ref 6–20)
CHLORIDE: 108 mmol/L (ref 98–111)
CO2: 25 mmol/L (ref 22–32)
Calcium: 8.5 mg/dL — ABNORMAL LOW (ref 8.9–10.3)
Creatinine, Ser: 0.73 mg/dL (ref 0.44–1.00)
Glucose, Bld: 179 mg/dL — ABNORMAL HIGH (ref 70–99)
Potassium: 3.8 mmol/L (ref 3.5–5.1)
SODIUM: 145 mmol/L (ref 135–145)
Total Bilirubin: 0.8 mg/dL (ref 0.3–1.2)
Total Protein: 7 g/dL (ref 6.5–8.1)

## 2018-01-03 LAB — CULTURE, BLOOD (ROUTINE X 2)
CULTURE: NO GROWTH
Culture: NO GROWTH
Special Requests: ADEQUATE

## 2018-01-03 LAB — CBC
HCT: 33.9 % — ABNORMAL LOW (ref 36.0–46.0)
Hemoglobin: 10.2 g/dL — ABNORMAL LOW (ref 12.0–15.0)
MCH: 20.1 pg — ABNORMAL LOW (ref 26.0–34.0)
MCHC: 30.1 g/dL (ref 30.0–36.0)
MCV: 66.9 fL — AB (ref 78.0–100.0)
PLATELETS: 324 10*3/uL (ref 150–400)
RBC: 5.07 MIL/uL (ref 3.87–5.11)
RDW: 16.2 % — AB (ref 11.5–15.5)
WBC: 21.9 10*3/uL — ABNORMAL HIGH (ref 4.0–10.5)

## 2018-01-03 LAB — GLUCOSE, CAPILLARY
GLUCOSE-CAPILLARY: 167 mg/dL — AB (ref 70–99)
Glucose-Capillary: 136 mg/dL — ABNORMAL HIGH (ref 70–99)
Glucose-Capillary: 147 mg/dL — ABNORMAL HIGH (ref 70–99)
Glucose-Capillary: 153 mg/dL — ABNORMAL HIGH (ref 70–99)
Glucose-Capillary: 174 mg/dL — ABNORMAL HIGH (ref 70–99)
Glucose-Capillary: 180 mg/dL — ABNORMAL HIGH (ref 70–99)

## 2018-01-03 LAB — MAGNESIUM: MAGNESIUM: 2.8 mg/dL — AB (ref 1.7–2.4)

## 2018-01-03 LAB — PHOSPHORUS: Phosphorus: 2 mg/dL — ABNORMAL LOW (ref 2.5–4.6)

## 2018-01-03 MED ORDER — PANTOPRAZOLE SODIUM 40 MG PO TBEC
40.0000 mg | DELAYED_RELEASE_TABLET | Freq: Every day | ORAL | Status: DC
  Administered 2018-01-04 – 2018-01-06 (×3): 40 mg via ORAL
  Filled 2018-01-03 (×3): qty 1

## 2018-01-03 MED ORDER — CHLORHEXIDINE GLUCONATE CLOTH 2 % EX PADS
6.0000 | MEDICATED_PAD | Freq: Every day | CUTANEOUS | Status: DC
  Administered 2018-01-03 – 2018-01-05 (×3): 6 via TOPICAL

## 2018-01-03 MED ORDER — FUROSEMIDE 10 MG/ML IJ SOLN
40.0000 mg | Freq: Three times a day (TID) | INTRAMUSCULAR | Status: DC
  Administered 2018-01-03 – 2018-01-06 (×9): 40 mg via INTRAVENOUS
  Filled 2018-01-03 (×9): qty 4

## 2018-01-03 MED ORDER — SODIUM CHLORIDE 0.9% FLUSH: 10.0000 mL | INTRAVENOUS | Status: DC | PRN

## 2018-01-03 MED ORDER — ACETAMINOPHEN 325 MG PO TABS
650.0000 mg | ORAL_TABLET | Freq: Four times a day (QID) | ORAL | Status: DC | PRN
  Administered 2018-01-04: 650 mg via ORAL
  Filled 2018-01-03: qty 2

## 2018-01-03 MED ORDER — SODIUM CHLORIDE 0.9% FLUSH
10.0000 mL | Freq: Two times a day (BID) | INTRAVENOUS | Status: DC
  Administered 2018-01-03 – 2018-01-05 (×8): 10 mL

## 2018-01-03 MED ORDER — ADULT MULTIVITAMIN W/MINERALS CH
1.0000 | ORAL_TABLET | Freq: Every day | ORAL | Status: DC
  Administered 2018-01-04 – 2018-01-06 (×3): 1 via ORAL
  Filled 2018-01-03 (×3): qty 1

## 2018-01-03 MED ORDER — ZOLPIDEM TARTRATE 5 MG PO TABS
5.0000 mg | ORAL_TABLET | Freq: Once | ORAL | Status: AC
  Administered 2018-01-03: 5 mg via ORAL
  Filled 2018-01-03: qty 1

## 2018-01-03 MED ORDER — BISOPROLOL FUMARATE 5 MG PO TABS: 10.0000 mg | ORAL_TABLET | Freq: Every day | ORAL | Status: DC

## 2018-01-03 MED ORDER — ACETAMINOPHEN 160 MG/5ML PO SOLN
650.0000 mg | Freq: Four times a day (QID) | ORAL | Status: DC | PRN
  Administered 2018-01-03: 650 mg
  Filled 2018-01-03: qty 20.3

## 2018-01-03 MED ORDER — POTASSIUM PHOSPHATES 15 MMOLE/5ML IV SOLN
20.0000 meq | Freq: Once | INTRAVENOUS | Status: AC
  Administered 2018-01-03: 20 meq via INTRAVENOUS
  Filled 2018-01-03: qty 4.55

## 2018-01-03 MED ORDER — METOPROLOL TARTRATE 5 MG/5ML IV SOLN
2.5000 mg | INTRAVENOUS | Status: DC | PRN
  Administered 2018-01-03 – 2018-01-04 (×5): 5 mg via INTRAVENOUS
  Filled 2018-01-03 (×5): qty 5

## 2018-01-03 MED ORDER — ADULT MULTIVITAMIN W/MINERALS CH: 1.0000 | ORAL_TABLET | Freq: Every day | ORAL | Status: DC

## 2018-01-03 MED ORDER — ORAL CARE MOUTH RINSE
15.0000 mL | Freq: Two times a day (BID) | OROMUCOSAL | Status: DC
  Administered 2018-01-04 – 2018-01-05 (×3): 15 mL via OROMUCOSAL

## 2018-01-03 NOTE — Procedures (Signed)
Extubation Procedure Note  Patient Details:   Name: Jenna Stephens DOB: 08-17-1975 MRN: [id number]   Airway Documentation:    Vent end date: 01/03/18 Vent end time: 0853   Evaluation  O2 sats: stable throughout Complications: No apparent complications Patient did tolerate procedure well. Bilateral Breath Sounds: Diminished, Expiratory wheezes   Yes  Johnette Abraham 01/03/2018, 8:53 AM

## 2018-01-03 NOTE — Progress Notes (Signed)
Nutrition Follow-up  DOCUMENTATION CODES:   Obesity unspecified  INTERVENTION:  - Diet advancement as medically feasible. - RD will monitor for nutrition-related needs following diet advancement.   NUTRITION DIAGNOSIS:   Inadequate oral intake related to inability to eat as evidenced by NPO status. -ongoing  GOAL:   Patient will meet greater than or equal to 90% of their needs -unable to meet at this time.   MONITOR:   Diet advancement, Weight trends, Labs  ASSESSMENT:   42 year old with history of HTN. She presented to the ED with 3 day hx of acute onset dyspnea and wheezing. Patient has been dealing with upper respiratory tract infection/cold for the past few weeks with no known exposures.  Weight beginning to trend back down. IV Lasix dosage being decreased tonight. Patient was extubated and OGT removed today around 8:50 AM. Patient remains NPO following extubation. Estimated nutrition needs updated at this time.    Medications reviewed; 40 mg IV Lasix TID, sliding scale Novolog, 40 mg Solu-medrol QID, daily multivitamin with minerals, 40 mEq KCl BID, 20 mEq IC KPhos x1 run today. Labs reviewed; CBGs: 180, 153, and 167 mg/dL today, BUN: 40 mg/dL, Ca: 8.5 mg/dL, Phos: 2 mg/dL, Mg: 2.8 mg/dL, LFTs elevated.     Diet Order:   Diet Order            Diet NPO time specified Except for: Ice Chips  Diet effective now              EDUCATION NEEDS:   No education needs have been identified at this time  Skin:  Skin Assessment: Reviewed RN Assessment  Last BM:  9/11  Height:   Ht Readings from Last 1 Encounters:  12/31/17 5\' 9"  (1.753 m)    Weight:   Wt Readings from Last 1 Encounters:  01/03/18 122.5 kg    Ideal Body Weight:  61.36 kg  BMI:  Body mass index is 39.88 kg/m.  Estimated Nutritional Needs:   Kcal:  2000-2220  Protein:  105-115 grams  Fluid:  >/= 1.8 L/day     Jarome Matin, MS, RD, LDN, [facility] Inpatient Clinical Dietitian Pager #  [phone] After hours/weekend pager # [phone]

## 2018-01-03 NOTE — Progress Notes (Signed)
Pt complained of N/V earlier in shift, OG put to low intermittent suction. Nausea subsided, OG is now clamped.

## 2018-01-03 NOTE — Care Management (Signed)
[id number] this am, remains with a.line in place. Weaning off iv precedex. Nestor Wieneke,BSN,RN3,CCM/
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
## 2018-01-04 ENCOUNTER — Inpatient Hospital Stay (HOSPITAL_COMMUNITY): Payer: Medicaid Other

## 2018-01-04 LAB — CBC
HEMATOCRIT: 36.4 % (ref 36.0–46.0)
HEMOGLOBIN: 11.2 g/dL — AB (ref 12.0–15.0)
MCH: 20.4 pg — AB (ref 26.0–34.0)
MCHC: 30.8 g/dL (ref 30.0–36.0)
MCV: 66.4 fL — ABNORMAL LOW (ref 78.0–100.0)
Platelets: 338 10*3/uL (ref 150–400)
RBC: 5.48 MIL/uL — AB (ref 3.87–5.11)
RDW: 16.5 % — ABNORMAL HIGH (ref 11.5–15.5)
WBC: 25.6 10*3/uL — AB (ref 4.0–10.5)

## 2018-01-04 LAB — BASIC METABOLIC PANEL
Anion gap: 10 (ref 5–15)
BUN: 27 mg/dL — AB (ref 6–20)
CHLORIDE: 98 mmol/L (ref 98–111)
CO2: 28 mmol/L (ref 22–32)
CREATININE: 0.58 mg/dL (ref 0.44–1.00)
Calcium: 8.8 mg/dL — ABNORMAL LOW (ref 8.9–10.3)
GFR calc Af Amer: >60 mL/min (ref >60)
GFR calc non Af Amer: >60 mL/min (ref >60)
GLUCOSE: 165 mg/dL — AB (ref 70–99)
Potassium: 3.8 mmol/L (ref 3.5–5.1)
SODIUM: 136 mmol/L (ref 135–145)

## 2018-01-04 LAB — GLUCOSE, CAPILLARY
GLUCOSE-CAPILLARY: 158 mg/dL — AB (ref 70–99)
GLUCOSE-CAPILLARY: 169 mg/dL — AB (ref 70–99)
Glucose-Capillary: 172 mg/dL — ABNORMAL HIGH (ref 70–99)
Glucose-Capillary: 198 mg/dL — ABNORMAL HIGH (ref 70–99)
Glucose-Capillary: 170 mg/dL — ABNORMAL HIGH (ref 70–99)

## 2018-01-04 LAB — CK: CK TOTAL: 690 U/L — AB (ref 38–234)

## 2018-01-04 MED ORDER — BACITRACIN-NEOMYCIN-POLYMYXIN OINTMENT TUBE
TOPICAL_OINTMENT | CUTANEOUS | Status: DC | PRN
  Filled 2018-01-04: qty 14.17

## 2018-01-04 NOTE — Progress Notes (Signed)
Ccm eye balled patient  S: doing well O: shook hands and thanked CCM sevice  A asthma P Future Appointments  Date Time Provider [place]  01/20/2018  3:15 PM Marshell Garfinkel, MD [facility] None   CCM will sign off    SIGNATURE    Dr. Brand Males, M.D., F.C.C.P,  Pulmonary and Critical Care Medicine Staff Physician, [place] Director - Interstitial Lung Disease  Program  [facility] [place], [place], [address]  Pager: [phone], If no answer or between  15:00h - 7:00h: call [phone] Telephone: [phone]  8:46 AM 01/04/2018

## 2018-01-04 NOTE — Progress Notes (Signed)
Patient to transfer to [facility] report given to receiving nurse Nira Conn, all questions answered at this time.  Pt. VSS with no s/s of distress noted.  Patient stable at transfer.

## 2018-01-04 NOTE — Final Progress Note (Addendum)
PROGRESS NOTE  RESHMA HOEY YDX:[id number] DOB: June 08, 1975 DOA: 12/29/2017 PCP: Patient, No Pcp Per  Brief Narrative: Jenna Stephens is a 42 y.o. female with medical history significant for hypertension, tobacco abuse, marijuana use presents to the ER with 3 days of nonproductive cough, significant shortness of breath with associated wheezing and chest tightness.  Patient initially thought she had a cold for the past few weeks, but was not getting any better.  Patient was admitted therefore to ICU and intubated she was extubated yesterday January 03, 2018.  She was started on IV Rocephin and Zithromax.   Interval history/Subjective: She is doing well with family at bedside she started eating well she is eagerly looking forward to physical therapy she had physical therapy done today and she still has some limitation they are recommending possibly skilled physical therapy on discharge pending further evaluation.   Assessment/Plan Acute respiratory failure due to bronchial asthma exacerbation with rhinovirus status post intubation pulmonary is on board will continue steroid inhaler and nebulizer treatments   hypertension controlled we will continue her on clonidine and as needed hydralazine  tobacco abuse/marijuana abuse.  Patient denies vaping  Patient will be transferred to telemetry   DVT prophylaxis: SCD Code Status: Full Family Communication: Family at bedside Disposition Plan: Home  Dr. Kyung Bacca [facility] Pager [phone]  01/04/2018, 9:56 AM  LOS: 6 days   Consultants:  Pulmonary medicine  Procedures:  Intubation  Antimicrobials:  Rocephin/Zithromax    Objective: Vitals:  Vitals:   01/04/18 0800 01/04/18 0806  BP:    Pulse:    Resp:    Temp: 97.8 F (36.6 C)   SpO2:  100%    Exam:  Constitutional:  . Appears calm and comfortable Eyes:  . pupils and irises appear normal . Normal lids and conjunctivae ENMT:  . grossly normal hearing   . Lips appear normal . external ears, nose appear normal . Oropharynx: mucosa, tongue,posterior pharynx appear normal Neck:  . neck appears normal, no masses, normal ROM, supple . no thyromegaly Respiratory:  . CTA bilaterally, no w/r/r.  . Respiratory effort normal. No retractions or accessory muscle use Cardiovascular:  . RRR, no m/r/g . No LE extremity edema   . Normal pedal pulses Abdomen:  . Abdomen appears normal; no tenderness or masses . No hernias . No HSM Musculoskeletal:  . Digits/nails BUE: no clubbing, cyanosis, petechiae, infection . exam of joints, bones, muscles of at least one of following: head/neck, RUE, LUE, RLE, LLE   o strength and tone normal, no atrophy, no abnormal movements o No tenderness, masses o Normal ROM, no contractures  . gait and station Skin:  . No rashes, lesions, ulcers . palpation of skin: no induration or nodules Neurologic:  . CN 2-12 intact . Sensation all 4 extremities intact Psychiatric:  . Mental status o Mood, affect appropriate o Orientation to person, place, time  . judgment and insight appear intact     I have personally reviewed the following:   Data: Results for ROSELINA, BURGUENO (MRN [id number]) as of 01/04/2018 19:07  Ref. Range 01/04/2018 04:47 01/04/2018 06:22 01/04/2018 47:65  BASIC METABOLIC PANEL Unknown  Rpt (A)   Sodium Latest Ref Range: 135 - 145 mmol/L  136   Potassium Latest Ref Range: 3.5 - 5.1 mmol/L  3.8   Chloride Latest Ref Range: 98 - 111 mmol/L  98   CO2 Latest Ref Range: 22 - 32 mmol/L  28   Glucose Latest Ref Range: 70 -  99 mg/dL  165 (H)   BUN Latest Ref Range: 6 - 20 mg/dL  27 (H)   Creatinine Latest Ref Range: 0.44 - 1.00 mg/dL  0.58   Calcium Latest Ref Range: 8.9 - 10.3 mg/dL  8.8 (L)   Anion gap Latest Ref Range: 5 - 15   10   GFR, Est Non African American Latest Ref Range: >60 mL/min  >60   GFR, Est African American Latest Ref Range: >60 mL/min  >60   CK Total Latest Ref Range: 38 - 234  U/L  690 (H)   WBC Latest Ref Range: 4.0 - 10.5 K/uL  25.6 (H)   RBC Latest Ref Range: 3.87 - 5.11 MIL/uL  5.48 (H)   Hemoglobin Latest Ref Range: 12.0 - 15.0 g/dL  11.2 (L)   HCT Latest Ref Range: 36.0 - 46.0 %  36.4   MCV Latest Ref Range: 78.0 - 100.0 fL  66.4 (L)   MCH Latest Ref Range: 26.0 - 34.0 pg  20.4 (L)   MCHC Latest Ref Range: 30.0 - 36.0 g/dL  30.8   RDW Latest Ref Range: 11.5 - 15.5 %  16.5 (H)   Platelets Latest Ref Range: 150 - 400 K/uL  338     EXAM: PORTABLE CHEST 1 VIEW  COMPARISON:  01/02/2018  FINDINGS: Cardiac shadows within normal limits. Right jugular central line is well placed. The lungs are clear bilaterally. No bony abnormality is seen.  IMPRESSION: No active disease.   Scheduled Meds: . chlorhexidine gluconate (MEDLINE KIT)  15 mL Mouth Rinse BID  . Chlorhexidine Gluconate Cloth  6 each Topical Daily  . cloNIDine  0.2 mg Oral BID  . enoxaparin (LOVENOX) injection  60 mg Subcutaneous Q24H  . furosemide  40 mg Intravenous Q8H  . insulin aspart  2-6 Units Subcutaneous Q4H  . ipratropium  0.5 mg Nebulization Q6H  . levalbuterol  1.25 mg Nebulization Q6H  . mouth rinse  15 mL Mouth Rinse BID  . methylPREDNISolone (SOLU-MEDROL) injection  40 mg Intravenous Q6H  . montelukast  10 mg Oral QHS  . multivitamin with minerals  1 tablet Oral Daily  . pantoprazole  40 mg Oral Daily  . potassium chloride  40 mEq Per Tube BID  . rOPINIRole  2 mg Oral QHS  . sertraline  50 mg Oral Daily  . sodium chloride flush  10-40 mL Intracatheter Q12H  . sodium chloride flush  3 mL Intravenous Q12H   Continuous Infusions: . sodium chloride      Active Problems:   Acute respiratory failure (HCC)   HTN (hypertension)   Obesity   Asthma with status asthmaticus   LOS: 6 days

## 2018-01-04 NOTE — Evaluation (Signed)
Physical Therapy Evaluation Patient Details Name: Jenna Stephens MRN: [id number] DOB: 14-Apr-1976 Today's Date: 01/04/2018   History of Present Illness  DEVANN CRIBB is a 42 y.o. female with medical history significant for hypertension, tobacco abuse, marijuana use presents to the ER with 3 days of nonproductive cough, significant shortness of breath with associated wheezing and chest tightness  , required intubation, extubated 01/03/18.  Clinical Impression  The patient was eager to get up  And ambulate x 20' with RW. Patient should progress to level to DC to home. Pt admitted with above diagnosis. Pt currently with functional limitations due to the deficits listed below (see PT Problem List).  Pt will benefit from skilled PT to increase their independence and safety with mobility to allow discharge to the venue listed below.       Follow Up Recommendations  TBD, possibly none.    Equipment Recommendations    none   Recommendations for Other Services   OT    Precautions / Restrictions Precautions Precautions: Fall Precaution Comments: monitor sats and HR and BP- has been high      Mobility  Bed Mobility Overal bed mobility: Needs Assistance Bed Mobility: Supine to Sit     Supine to sit: Supervision        Transfers Overall transfer level: Needs assistance Equipment used: Rolling [place] (2 wheeled) Transfers: Sit to/from Stand Sit to Stand: Min guard         General transfer comment: no asisstnace required  Ambulation/Gait Ambulation/Gait assistance: +2 safety/equipment;Min assist Gait Distance (Feet): 20 Feet Assistive device: Rolling [place] (2 wheeled) Gait Pattern/deviations: Step-to pattern;Step-through pattern     General Gait Details: decreased speed. Saturation >91% on RA, HR 104. BPpre 164/98- did not take post ambualtion.  Stairs            Wheelchair Mobility    Modified Rankin (Stroke Patients Only)       Balance                                              Pertinent Vitals/Pain Pain Assessment: No/denies pain    Home Living Family/patient expects to be discharged to:: Private residence Living Arrangements: Spouse/significant other;Children Available Help at Discharge: Family Type of Home: House Home Access: Stairs to enter   Technical brewer of Steps: 1 Home Layout: One level Home Equipment: None      Prior Function Level of Independence: Independent               Hand Dominance        Extremity/Trunk Assessment   Upper Extremity Assessment Upper Extremity Assessment: Overall WFL for tasks assessed    Lower Extremity Assessment Lower Extremity Assessment: Generalized weakness    Cervical / Trunk Assessment Cervical / Trunk Assessment: Normal  Communication   Communication: No difficulties  Cognition Arousal/Alertness: Awake/alert Behavior During Therapy: WFL for tasks assessed/performed Overall Cognitive Status: Within Functional Limits for tasks assessed                                        General Comments      Exercises     Assessment/Plan    PT Assessment Patient needs continued PT services  PT Problem List Decreased strength;Decreased activity tolerance;Decreased mobility;Cardiopulmonary status limiting  activity;Decreased knowledge of precautions;Decreased safety awareness;Decreased knowledge of use of DME       PT Treatment Interventions DME instruction;Gait training;Functional mobility training;Therapeutic activities;Patient/family education;Therapeutic exercise    PT Goals (Current goals can be found in the Care Plan section)  Acute Rehab PT Goals Patient Stated Goal: to get strong and go home PT Goal Formulation: With patient/family Time For Goal Achievement: 01/18/18 Potential to Achieve Goals: Good    Frequency Min 3X/week   Barriers to discharge        Co-evaluation               AM-PAC PT "6 Clicks"  Daily Activity  Outcome Measure Difficulty turning over in bed (including adjusting bedclothes, sheets and blankets)?: A Little Difficulty moving from lying on back to sitting on the side of the bed? : A Little Difficulty sitting down on and standing up from a chair with arms (e.g., wheelchair, bedside commode, etc,.)?: A Little Help needed moving to and from a bed to chair (including a wheelchair)?: A Lot Help needed walking in hospital room?: A Lot Help needed climbing 3-5 steps with a railing? : Total 6 Click Score: 14    End of Session   Activity Tolerance: Patient limited by fatigue Patient left: in chair;with call bell/phone within reach;with family/visitor present Nurse Communication: Mobility status PT Visit Diagnosis: Unsteadiness on feet (R26.81)    Time: 1300-1320 PT Time Calculation (min) (ACUTE ONLY): 20 min   Charges:   PT Evaluation $PT Eval Low Complexity: [place] Pager [phone] Office [phone]   Claretha Cooper 01/04/2018, 2:14 PM

## 2018-01-05 LAB — GLUCOSE, CAPILLARY
GLUCOSE-CAPILLARY: 160 mg/dL — AB (ref 70–99)
Glucose-Capillary: 147 mg/dL — ABNORMAL HIGH (ref 70–99)
Glucose-Capillary: 155 mg/dL — ABNORMAL HIGH (ref 70–99)
Glucose-Capillary: 179 mg/dL — ABNORMAL HIGH (ref 70–99)
Glucose-Capillary: 183 mg/dL — ABNORMAL HIGH (ref 70–99)
Glucose-Capillary: 187 mg/dL — ABNORMAL HIGH (ref 70–99)
Glucose-Capillary: 219 mg/dL — ABNORMAL HIGH (ref 70–99)

## 2018-01-05 LAB — CBC WITH DIFFERENTIAL/PLATELET
BASOS ABS: 0 10*3/uL (ref 0.0–0.1)
Basophils Relative: 0 %
EOS ABS: 0 10*3/uL (ref 0.0–0.7)
Eosinophils Relative: 0 %
HCT: 35.7 % — ABNORMAL LOW (ref 36.0–46.0)
HEMOGLOBIN: 11.1 g/dL — AB (ref 12.0–15.0)
LYMPHS PCT: 10 %
Lymphs Abs: 2.2 10*3/uL (ref 0.7–4.0)
MCH: 20.8 pg — ABNORMAL LOW (ref 26.0–34.0)
MCHC: 31.1 g/dL (ref 30.0–36.0)
MCV: 67 fL — ABNORMAL LOW (ref 78.0–100.0)
Monocytes Absolute: 0.7 10*3/uL (ref 0.1–1.0)
Monocytes Relative: 3 %
NEUTROS ABS: 19 10*3/uL — AB (ref 1.7–7.7)
Neutrophils Relative %: 87 %
Platelets: 343 10*3/uL (ref 150–400)
RBC: 5.33 MIL/uL — AB (ref 3.87–5.11)
RDW: 17 % — AB (ref 11.5–15.5)
WBC: 21.9 10*3/uL — AB (ref 4.0–10.5)

## 2018-01-05 LAB — BASIC METABOLIC PANEL
Anion gap: 8 (ref 5–15)
BUN: 26 mg/dL — ABNORMAL HIGH (ref 6–20)
CALCIUM: 8.8 mg/dL — AB (ref 8.9–10.3)
CO2: 29 mmol/L (ref 22–32)
CREATININE: 0.7 mg/dL (ref 0.44–1.00)
Chloride: 103 mmol/L (ref 98–111)
GFR calc Af Amer: >60 mL/min (ref >60)
GFR calc non Af Amer: >60 mL/min (ref >60)
Glucose, Bld: 145 mg/dL — ABNORMAL HIGH (ref 70–99)
Potassium: 4 mmol/L (ref 3.5–5.1)
SODIUM: 140 mmol/L (ref 135–145)

## 2018-01-05 MED ORDER — IPRATROPIUM BROMIDE 0.02 % IN SOLN
0.5000 mg | Freq: Three times a day (TID) | RESPIRATORY_TRACT | Status: DC
  Administered 2018-01-06: 0.5 mg via RESPIRATORY_TRACT
  Filled 2018-01-05: qty 2.5

## 2018-01-05 MED ORDER — LEVALBUTEROL HCL 1.25 MG/0.5ML IN NEBU
1.2500 mg | INHALATION_SOLUTION | Freq: Three times a day (TID) | RESPIRATORY_TRACT | Status: DC
  Administered 2018-01-06: 1.25 mg via RESPIRATORY_TRACT
  Filled 2018-01-05: qty 0.5

## 2018-01-05 NOTE — Final Progress Note (Signed)
PROGRESS NOTE  Jenna Stephens:[id number] DOB: 09/22/1975 DOA: 12/29/2017 PCP: Patient, No Pcp Per  Brief Narrative: Jenna Stephens is a 42 y.o. female with medical history significant for hypertension, tobacco abuse, marijuana use presents to the ER with 3 days of nonproductive cough, significant shortness of breath with associated wheezing and chest tightness.  Patient initially thought she had a cold for the past few weeks, but was not getting any better.  Patient was admitted therefore to ICU and intubated she was extubated yesterday January 03, 2018.  She was started on IV Rocephin and Zithromax.   Interval history/Subjective: She is doing well with family at bedside she started eating well she is eagerly looking forward to physical therapy she had physical therapy done today and she still has some limitation they are recommending possibly skilled physical therapy on discharge pending further evaluation.  September 15: Doing much better except her blood pressure is elevated felt short of breath with exertion today worked with PT yesterday but they have not come back today   Assessment/Plan Active Problems:   Acute respiratory failure (HCC)   HTN (hypertension)   Obesity   Asthma with status asthmaticus  Acute respiratory failure due to bronchial asthma exacerbation with rhinovirus status post intubation pulmonary is on board will continue steroid inhaler and nebulizer treatments will check for ambulatory O2 level hypertension controlled we will continue her on clonidine and as needed hydralazine  tobacco abuse/marijuana abuse.  Patient denies vaping  Patient will be transferred to telemetry   DVT prophylaxis: SCD Code Status: Full Family Communication: Family at bedside Disposition Plan: Home  Dr. Kyung Bacca [facility] Pager [phone]  01/05/2018, 8:21 PM  LOS: 7 days   Consultants:  Pulmonary  medicine  Procedures:  Intubation  Antimicrobials:  Rocephin/Zithromax    Objective: Vitals:  Vitals:   01/05/18 1444 01/05/18 1716  BP:  (!) 166/105  Pulse:  (!) 101  Resp:    Temp:    SpO2: 98%     Exam:  Constitutional:  . Appears calm and comfortable Eyes:  . pupils and irises appear normal . Normal lids and conjunctivae ENMT:  . grossly normal hearing  . Lips appear normal . external ears, nose appear normal . Oropharynx: mucosa, tongue,posterior pharynx appear normal Neck:  . neck appears normal, no masses, normal ROM, supple . no thyromegaly Respiratory:  . CTA bilaterally, no w/r/r.  . Respiratory effort normal. No retractions or accessory muscle use Cardiovascular:  . RRR, no m/r/g . No LE extremity edema   . Normal pedal pulses Abdomen:  . Abdomen appears normal; no tenderness or masses . No hernias . No HSM Musculoskeletal:  . Digits/nails BUE: no clubbing, cyanosis, petechiae, infection . exam of joints, bones, muscles of at least one of following: head/neck, RUE, LUE, RLE, LLE   o strength and tone normal, no atrophy, no abnormal movements o No tenderness, masses o Normal ROM, no contractures  Skin:  . No rashes, lesions, ulcers . palpation of skin: no induration or nodules Neurologic:  . CN 2-12 intact . Sensation all 4 extremities intact Psychiatric:  . Mental status o Mood, affect appropriate o Orientation to person, place, time  . judgment and insight appear intact     I have personally reviewed the following:   Data: Results for Jenna, Stephens (MRN [id number]) as of 01/05/2018 20:23  Ref. Range 01/05/2018 04:09  Sodium Latest Ref Range: 135 - 145 mmol/L 140  Potassium Latest Ref Range: 3.5 -  5.1 mmol/L 4.0  Chloride Latest Ref Range: 98 - 111 mmol/L 103  CO2 Latest Ref Range: 22 - 32 mmol/L 29  Glucose Latest Ref Range: 70 - 99 mg/dL 145 (H)  BUN Latest Ref Range: 6 - 20 mg/dL 26 (H)  Creatinine Latest Ref Range: 0.44 -  1.00 mg/dL 0.70  Calcium Latest Ref Range: 8.9 - 10.3 mg/dL 8.8 (L)  Anion gap Latest Ref Range: 5 - 15  8  GFR, Est Non African American Latest Ref Range: >60 mL/min >60  GFR, Est African American Latest Ref Range: >60 mL/min >60  WBC Latest Ref Range: 4.0 - 10.5 K/uL 21.9 (H)  RBC Latest Ref Range: 3.87 - 5.11 MIL/uL 5.33 (H)  Hemoglobin Latest Ref Range: 12.0 - 15.0 g/dL 11.1 (L)  HCT Latest Ref Range: 36.0 - 46.0 % 35.7 (L)  MCV Latest Ref Range: 78.0 - 100.0 fL 67.0 (L)  MCH Latest Ref Range: 26.0 - 34.0 pg 20.8 (L)  MCHC Latest Ref Range: 30.0 - 36.0 g/dL 31.1  RDW Latest Ref Range: 11.5 - 15.5 % 17.0 (H)  Platelets Latest Ref Range: 150 - 400 K/uL 343  Neutrophils Latest Units: % 87  Lymphocytes Latest Units: % 10  Monocytes Relative Latest Units: % 3  Eosinophil Latest Units: % 0  Basophil Latest Units: % 0  NEUT# Latest Ref Range: 1.7 - 7.7 K/uL 19.0 (H)  Lymphocyte # Latest Ref Range: 0.7 - 4.0 K/uL 2.2  Monocyte # Latest Ref Range: 0.1 - 1.0 K/uL 0.7  Eosinophils Absolute Latest Ref Range: 0.0 - 0.7 K/uL 0.0  Basophils Absolute Latest Ref Range: 0.0 - 0.1 K/uL 0.0  RBC Morphology Unknown POLYCHROMASIA PRESENT   EXAM: PORTABLE CHEST 1 VIEW  COMPARISON:  01/02/2018  FINDINGS: Cardiac shadows within normal limits. Right jugular central line is well placed. The lungs are clear bilaterally. No bony abnormality is seen.  IMPRESSION: No active disease.   Scheduled Meds: . chlorhexidine gluconate (MEDLINE KIT)  15 mL Mouth Rinse BID  . Chlorhexidine Gluconate Cloth  6 each Topical Daily  . cloNIDine  0.2 mg Oral BID  . enoxaparin (LOVENOX) injection  60 mg Subcutaneous Q24H  . furosemide  40 mg Intravenous Q8H  . insulin aspart  2-6 Units Subcutaneous Q4H  . ipratropium  0.5 mg Nebulization Q6H  . levalbuterol  1.25 mg Nebulization Q6H  . mouth rinse  15 mL Mouth Rinse BID  . methylPREDNISolone (SOLU-MEDROL) injection  40 mg Intravenous Q6H  . montelukast  10  mg Oral QHS  . multivitamin with minerals  1 tablet Oral Daily  . pantoprazole  40 mg Oral Daily  . potassium chloride  40 mEq Per Tube BID  . rOPINIRole  2 mg Oral QHS  . sertraline  50 mg Oral Daily  . sodium chloride flush  10-40 mL Intracatheter Q12H  . sodium chloride flush  3 mL Intravenous Q12H   Continuous Infusions: . sodium chloride 10 mL/hr at 01/05/18 1027    Active Problems:   Acute respiratory failure (HCC)   HTN (hypertension)   Obesity   Asthma with status asthmaticus   LOS: 7 days

## 2018-01-05 NOTE — Plan of Care (Signed)
Patient continues to maintain oxygen level in the 90's on room air, continues to diurese.  Patient without complaint, up and down to bathroom and up to chair this shift.  Multiple family members at bedside.  BP remains elevated.

## 2018-01-06 DIAGNOSIS — Z978 Presence of other specified devices: Secondary | ICD-10-CM

## 2018-01-06 DIAGNOSIS — R0603 Acute respiratory distress: Secondary | ICD-10-CM

## 2018-01-06 DIAGNOSIS — I10 Essential (primary) hypertension: Secondary | ICD-10-CM

## 2018-01-06 LAB — CBC WITH DIFFERENTIAL/PLATELET
BASOS ABS: 0 10*3/uL (ref 0.0–0.1)
Basophils Relative: 0 %
Eosinophils Absolute: 0 10*3/uL (ref 0.0–0.7)
Eosinophils Relative: 0 %
HEMATOCRIT: 34.3 % — AB (ref 36.0–46.0)
Hemoglobin: 10.5 g/dL — ABNORMAL LOW (ref 12.0–15.0)
LYMPHS ABS: 1 10*3/uL (ref 0.7–4.0)
Lymphocytes Relative: 6 %
MCH: 20.5 pg — ABNORMAL LOW (ref 26.0–34.0)
MCHC: 30.6 g/dL (ref 30.0–36.0)
MCV: 67.1 fL — ABNORMAL LOW (ref 78.0–100.0)
MONOS PCT: 6 %
Monocytes Absolute: 1 10*3/uL (ref 0.1–1.0)
Neutro Abs: 14.8 10*3/uL — ABNORMAL HIGH (ref 1.7–7.7)
Neutrophils Relative %: 88 %
Platelets: 311 10*3/uL (ref 150–400)
RBC: 5.11 MIL/uL (ref 3.87–5.11)
RDW: 17.2 % — AB (ref 11.5–15.5)
WBC: 16.8 10*3/uL — AB (ref 4.0–10.5)

## 2018-01-06 LAB — GLUCOSE, CAPILLARY
GLUCOSE-CAPILLARY: 160 mg/dL — AB (ref 70–99)
GLUCOSE-CAPILLARY: 141 mg/dL — AB (ref 70–99)
GLUCOSE-CAPILLARY: 164 mg/dL — AB (ref 70–99)

## 2018-01-06 MED ORDER — PANTOPRAZOLE SODIUM 40 MG PO TBEC: 40.0000 mg | DELAYED_RELEASE_TABLET | Freq: Every day | ORAL | 0 refills | Status: DC

## 2018-01-06 MED ORDER — CLONIDINE HCL 0.2 MG PO TABS: 0.2000 mg | ORAL_TABLET | Freq: Two times a day (BID) | ORAL | 0 refills | Status: DC

## 2018-01-06 MED ORDER — IPRATROPIUM BROMIDE 0.02 % IN SOLN: 0.5000 mg | Freq: Three times a day (TID) | RESPIRATORY_TRACT | 0 refills | Status: DC

## 2018-01-06 MED ORDER — LEVALBUTEROL HCL 1.25 MG/0.5ML IN NEBU: 1.2500 mg | INHALATION_SOLUTION | Freq: Three times a day (TID) | RESPIRATORY_TRACT | 0 refills | Status: DC

## 2018-01-06 MED ORDER — PREDNISONE 10 MG PO TABS: 10.0000 mg | ORAL_TABLET | Freq: Every day | ORAL | 0 refills | Status: AC

## 2018-01-06 MED ORDER — MONTELUKAST SODIUM 10 MG PO TABS: 10.0000 mg | ORAL_TABLET | Freq: Every day | ORAL | 0 refills | Status: DC

## 2018-01-06 MED ORDER — ADULT MULTIVITAMIN W/MINERALS CH: 1.0000 | ORAL_TABLET | Freq: Every day | ORAL | 0 refills | Status: DC

## 2018-01-06 NOTE — Progress Notes (Signed)
Patient given discharge instructions, prescriptions and note for work from Dr. [place] Crane.  Patient has [place] in place.  Will proceed with discharge.

## 2018-01-06 NOTE — Discharge Summary (Signed)
Discharge Summary  Jenna Stephens:[id number] DOB: 1975-10-02  PCP: Patient, No Pcp Per  Admit date: 12/29/2017 Discharge date: 01/06/2018  Time spent: 25 minutes  Recommendations for Outpatient Follow-up:  1. Follow-up with your PCP 2. Follow-up with pulmonology 3. Take your medications as prescribed 4. Avoid polysubstance abuse  Discharge Diagnoses:  Active Hospital Problems   Diagnosis Date Noted  . Asthma with status asthmaticus   . Acute respiratory failure ([place]) 12/29/2017  . HTN (hypertension) 12/29/2017  . Obesity 12/29/2017    Resolved Hospital Problems  No resolved problems to display.    Discharge Condition: Stable  Diet recommendation: Resume previous diet  Vitals:   01/06/18 0445 01/06/18 0817  BP: 129/86   Pulse: 82   Resp: 20   Temp: 97.8 F (36.6 C)   SpO2: 92% 92%    History of present illness:  Jenna S Moreheadis a 42 y.o.femalewith medical history significant forhypertension,marijuana use presents to the ER with 3 days of nonproductive cough, significant shortness of breath with associated wheezing and chest tightness. Patient initially thought she had a cold for the past few weeks, but was not getting any better.  Patient was admitted therefore to ICU and intubated she was extubated January 03, 2018.  She completed course of IV Rocephin and Zithromax.   01/06/2018: Patient seen and examined at her bedside.  No acute events overnight.  She states she feels much better.  Home O2 evaluation ordered to assess for any oxygen supplementation on ambulation.  Patient denies chest pain, palpitations or dyspnea.  She has no new complaints.  On the day of discharge, the patient was hemodynamically stable.  She will need to follow-up with her PCP and pulmonologist posthospitalization.  Hospital Course:  Active Problems:   Acute respiratory failure (HCC)   HTN (hypertension)   Obesity   Asthma with status asthmaticus  Acute hypoxic hypercarbic  respiratory failure due to bronchial asthma exacerbation status post intubation, resolving Continue nebs Follow-up with pulmonology outpatient  Hypertension Blood pressure is stable Continue home antihypertensive medications  Polysubstance abuse/marijuana abuse.  Avoid polysubstance abuse including smoking  Leukocytosis, suspect reactive from steroid use No sign of acute infective process Afebrile Follow-up with your primary care provider      Procedures:  Intubation  Extubation  Consultations:  Pulmonary medicine  Discharge Exam: BP 129/86 (BP Location: Left Arm)   Pulse 82   Temp 97.8 F (36.6 C) (Oral)   Resp 20   Ht 5\' 8"  (1.727 m)   Wt 120.7 kg   LMP 11/24/2017   SpO2 92%   BMI 40.45 kg/m  . General: 42 y.o. year-old female well developed well nourished in no acute distress.  Alert and oriented x3. . Cardiovascular: Regular rate and rhythm with no rubs or gallops.  No thyromegaly or JVD noted.   Marland Kitchen Respiratory: Clear to auscultation with no wheezes or rales. Good inspiratory effort. . Abdomen: Soft nontender nondistended with normal bowel sounds x4 quadrants. . Musculoskeletal: No lower extremity edema. 2/4 pulses in all 4 extremities. . Skin: No ulcerative lesions noted or rashes, . Psychiatry: Mood is appropriate for condition and setting  Discharge Instructions You were cared for by a hospitalist during your hospital stay. If you have any questions about your discharge medications or the care you received while you were in the hospital after you are discharged, you can call the unit and asked to speak with the hospitalist on call if the hospitalist that took care of you is  not available. Once you are discharged, your primary care physician will handle any further medical issues. Please note that NO REFILLS for any discharge medications will be authorized once you are discharged, as it is imperative that you return to your primary care physician (or establish  a relationship with a primary care physician if you do not have one) for your aftercare needs so that they can reassess your need for medications and monitor your lab values.  Discharge Instructions    DME Nebulizer machine   Complete by:  As directed    Patient needs a nebulizer to treat with the following condition:  Asthma exacerbation in COPD ([place])     Allergies as of 01/06/2018   No Known Allergies     Medication List    STOP taking these medications   acetaminophen 325 MG tablet Commonly known as:  TYLENOL   aspirin-sod bicarb-citric acid 325 MG Tbef tablet Commonly known as:  ALKA-SELTZER   cyclobenzaprine 10 MG tablet Commonly known as:  FLEXERIL   Doxylamine-Pyridoxine 10-10 MG Tbec   ibuprofen 200 MG tablet Commonly known as:  ADVIL,MOTRIN   labetalol 200 MG tablet Commonly known as:  NORMODYNE   meloxicam 15 MG tablet Commonly known as:  MOBIC   promethazine 25 MG tablet Commonly known as:  PHENERGAN   zolpidem 10 MG tablet Commonly known as:  AMBIEN     TAKE these medications   cloNIDine 0.2 MG tablet Commonly known as:  CATAPRES Take 1 tablet (0.2 mg total) by mouth 2 (two) times daily. What changed:    medication strength  how much to take   ipratropium 0.02 % nebulizer solution Commonly known as:  ATROVENT Take 2.5 mLs (0.5 mg total) by nebulization 3 (three) times daily.   levalbuterol 1.25 MG/0.5ML nebulizer solution Commonly known as:  XOPENEX Take 1.25 mg by nebulization 3 (three) times daily.   montelukast 10 MG tablet Commonly known as:  SINGULAIR Take 1 tablet (10 mg total) by mouth at bedtime.   multivitamin with minerals Tabs tablet Take 1 tablet by mouth daily.   pantoprazole 40 MG tablet Commonly known as:  PROTONIX Take 1 tablet (40 mg total) by mouth daily.   predniSONE 10 MG tablet Commonly known as:  DELTASONE Take 1 tablet (10 mg total) by mouth daily with breakfast for 5 days.   rOPINIRole 2 MG tablet Commonly  known as:  REQUIP Take 2 mg by mouth at bedtime.   sertraline 50 MG tablet Commonly known as:  ZOLOFT Take 50 mg by mouth daily.            Durable Medical Equipment  (From admission, onward)         Start     Ordered   01/06/18 0000  DME Nebulizer machine    Question:  Patient needs a nebulizer to treat with the following condition  Answer:  Asthma exacerbation in COPD ([place])   01/06/18 1011         No Known Allergies Follow-up Information    Marshell Garfinkel, MD Follow up on 01/20/2018.   Specialty:  Pulmonary Disease Why:  Appt at 3:15 PM for asthma evaluation. Please report to the office by 3:00 PM for check in. Contact information: [address] [place] [address] [phone]        [facility]. Call in 1 day(s).   Why:  Please call for an appointment. Contact information: [place]  [place] [address] [phone]           The results of significant diagnostics from this hospitalization (including imaging, microbiology, ancillary and laboratory) are listed below for reference.    Significant Diagnostic Studies: Ct Angio Chest Pe W/cm &/or Wo Cm  Result Date: 12/29/2017 CLINICAL DATA:  Shortness of breath and cough. EXAM: CT ANGIOGRAPHY CHEST WITH CONTRAST TECHNIQUE: Multidetector CT imaging of the chest was performed using the standard protocol during bolus administration of intravenous contrast. Multiplanar CT image reconstructions and MIPs were obtained to evaluate the vascular anatomy. CONTRAST:  The patient received 2 doses Isovue 370, 100 mL for each dose. COMPARISON:  Chest x-ray December 29, 2017 FINDINGS: Cardiovascular: The heart size is normal. No obvious coronary artery calcifications identified. The thoracic aorta demonstrates no aneurysm, dissection, or atherosclerotic change. Evaluation of pulmonary arteries is limited due to significant respiratory motion. The study was  repeated with continued motion on the repeat study. No obvious pulmonary emboli identified. Evaluation is markedly limited beyond the main pulmonary arterial level, however. Mediastinum/Nodes: No enlarged mediastinal, hilar, or axillary lymph nodes. Thyroid gland, trachea, and esophagus demonstrate no significant findings. Lungs/Pleura: Central airways are normal. No pneumothorax. Evaluation is limited due to respiratory motion. Opacity in left apex demonstrates a cluster nodular appearance well seen on series 11, images 36 and 37. Mild focal opacity in the lateral right upper lobe on series 11, image 58 is well. Mild ground-glass opacity in the right middle lobe on series 11, image 95a. No other nodules or masses identified. Upper Abdomen: No acute abnormality. Musculoskeletal: No chest wall abnormality. No acute or significant osseous findings. Review of the MIP images confirms the above findings. IMPRESSION: 1. The study is nearly nondiagnostic beyond the level of the main pulmonary arteries. Within the significant limitations, no pulmonary emboli are definitely identified. If concern persists, a repeat study when the patient can better follow breathing directions or a V/Q scan could further evaluate. If another study is necessary today, I would recommend a V/Q scan as the patient has already received 2 contrast doses today. 2. Patchy nodular opacities in the left apex. Mild focal opacity in the lateral right lung. Findings are suspicious for an infectious or inflammatory process. Recommend short-term follow-up CT imaging to ensure resolution. Electronically Signed   By: Dorise Bullion III M.D   On: 12/29/2017 07:41   Dg Chest Port 1 View  Result Date: 01/04/2018 CLINICAL DATA:  Respiratory failure EXAM: PORTABLE CHEST 1 VIEW COMPARISON:  01/02/2018 FINDINGS: Cardiac shadows within normal limits. Right jugular central line is well placed. The lungs are clear bilaterally. No bony abnormality is seen.  IMPRESSION: No active disease. Electronically Signed   By: Inez Catalina M.D.   On: 01/04/2018 07:15   Dg Chest Port 1 View  Result Date: 01/02/2018 CLINICAL DATA:  ET tube EXAM: PORTABLE CHEST 1 VIEW COMPARISON:  01/01/2018 FINDINGS: Endotracheal tube, right central line and NG tube remain in place, unchanged. Low lung volumes with vascular congestion and bibasilar atelectasis. No visible effusions. IMPRESSION: Low lung volumes with vascular congestion and bibasilar atelectasis. Electronically Signed   By: Rolm Baptise M.D.   On: 01/02/2018 07:57   Dg Chest Port 1 View  Result Date: 01/01/2018 CLINICAL DATA:  Hypoxia EXAM: PORTABLE CHEST 1 VIEW COMPARISON:  December 31, 2017 FINDINGS: Endotracheal tube tip is 2.3 cm above the carina. Nasogastric tube tip and side port are below the diaphragm. Central catheter tip is at the cavoatrial junction. No pneumothorax. There  is atelectatic change in the left base. The lungs elsewhere clear. Heart is borderline enlarged with pulmonary vascularity normal. No adenopathy. No evident bone lesions. IMPRESSION: Tube and catheter positions as described without pneumothorax. Stable atelectasis left base. Stable cardiac silhouette. Electronically Signed   By: Lowella Grip III M.D.   On: 01/01/2018 07:14   Dg Chest Port 1 View  Result Date: 12/31/2017 CLINICAL DATA:  Assess endotracheal tube position. Acute respiratory failure. EXAM: PORTABLE CHEST 1 VIEW COMPARISON:  Portable chest x-ray of December 30, 2017 FINDINGS: The endotracheal tube tip projects approximately 2 cm above the crotch of the carina. The patient is rotated on today's study. There is increased density at the left lung base. There is a small left pleural effusion. The pulmonary vascularity is mildly prominent. The esophagogastric tube tip in proximal port project below the left hemidiaphragm. IMPRESSION: The endotracheal tube tip lies approximately 2 cm above the carina. There is left lower lobe  atelectasis or pneumonia with small left pleural effusion. Electronically Signed   By: David  Martinique M.D.   On: 12/31/2017 09:18   Dg Chest Port 1 View  Result Date: 12/30/2017 CLINICAL DATA:  Acute respiratory failure. EXAM: PORTABLE CHEST 1 VIEW COMPARISON:  12/29/2017 FINDINGS: Stable position of endotracheal tube, and central venous catheter. Enteric catheter tip collimated off the image. Cardiomediastinal silhouette is normal. Mediastinal contours appear intact. There is no evidence of focal airspace consolidation, pleural effusion or pneumothorax. Low lung volumes with mild prominence of the interstitium. Osseous structures are without acute abnormality. Soft tissues are grossly normal. IMPRESSION: Low lung volumes with mild prominence of the interstitium. The previously demonstrated by CT infiltrates within the upper lobes are not clearly seen. Stable support apparatus. Electronically Signed   By: Fidela Salisbury M.D.   On: 12/30/2017 07:18   Dg Chest Port 1 View  Result Date: 12/29/2017 CLINICAL DATA:  Intubated. EXAM: PORTABLE CHEST 1 VIEW COMPARISON:  Earlier today. FINDINGS: Interval endotracheal tube in satisfactory position. Interval nasogastric tube extending into the stomach. Interval right jugular catheter with its tip in the inferior aspect of the superior vena cava near the superior cavoatrial junction. No pneumothorax. Normal sized heart. Clear lungs. Unremarkable bones. IMPRESSION: Satisfactory position of the support apparatus. No acute abnormality. Electronically Signed   By: Claudie Revering M.D.   On: 12/29/2017 15:35   Dg Chest Port 1 View  Result Date: 12/29/2017 CLINICAL DATA:  Shortness of breath and cough EXAM: PORTABLE CHEST 1 VIEW COMPARISON:  Chest radiograph 10/10/2016 FINDINGS: The heart size and mediastinal contours are within normal limits. Both lungs are clear. The visualized skeletal structures are unremarkable. IMPRESSION: No active disease. Electronically Signed   By:  Ulyses Jarred M.D.   On: 12/29/2017 05:56   Dg Abd Portable 1v  Result Date: 12/29/2017 CLINICAL DATA:  Orogastric tube placement. EXAM: PORTABLE ABDOMEN - 1 VIEW COMPARISON:  None. FINDINGS: The bowel gas pattern is normal. No radio-opaque calculi or other significant radiographic abnormality are seen. Enteric catheter within the expected location of gastric body. IMPRESSION: Enteric catheter within the expected location of gastric body. Electronically Signed   By: Fidela Salisbury M.D.   On: 12/29/2017 15:35    Microbiology: Recent Results (from the past 240 hour(s))  Culture, blood (routine x 2)     Status: None   Collection Time: 12/29/17  9:00 AM  Result Value Ref Range Status   Specimen Description   Final    BLOOD RIGHT HAND Performed at [facility]  [facility], [place] [address] [place], [place] [address]    Special Requests   Final    BOTTLES DRAWN AEROBIC AND ANAEROBIC Blood Culture adequate volume Performed at [place] [address] [place], [place] [address]    Culture   Final    NO GROWTH 5 DAYS Performed at [facility], [place] [address], [place], [place] [address]    Report Status 01/03/2018 FINAL  Final  Culture, blood (routine x 2)     Status: None   Collection Time: 12/29/17  9:01 AM  Result Value Ref Range Status   Specimen Description   Final    BLOOD LEFT HAND Performed at [place] [address], [place], [place] [address]    Special Requests   Final    BOTTLES DRAWN AEROBIC AND ANAEROBIC Blood Culture results may not be optimal due to an excessive volume of blood received in culture bottles Performed at [facility] [address] [place], [place] [address]    Culture   Final    NO GROWTH 5 DAYS Performed at [facility], [place] [address], [place], [place] [address]    Report Status 01/03/2018 FINAL  Final  MRSA PCR Screening     Status: None   Collection Time:  12/29/17 10:01 AM  Result Value Ref Range Status   MRSA by PCR NEGATIVE NEGATIVE Final    Comment:        The GeneXpert MRSA Assay (FDA approved for NASAL specimens only), is one component of a comprehensive MRSA colonization surveillance program. It is not intended to diagnose MRSA infection nor to guide or monitor treatment for MRSA infections. Performed at [facility], [place] [address], [place], [place] [address]   Respiratory Panel by PCR     Status: Abnormal   Collection Time: 12/29/17  1:00 PM  Result Value Ref Range Status   Adenovirus NOT DETECTED NOT DETECTED Final   Coronavirus 229E NOT DETECTED NOT DETECTED Final   Coronavirus HKU1 NOT DETECTED NOT DETECTED Final   Coronavirus NL63 NOT DETECTED NOT DETECTED Final   Coronavirus OC43 NOT DETECTED NOT DETECTED Final   Metapneumovirus NOT DETECTED NOT DETECTED Final   Rhinovirus / Enterovirus DETECTED (A) NOT DETECTED Final   Influenza A NOT DETECTED NOT DETECTED Final   Influenza B NOT DETECTED NOT DETECTED Final   Parainfluenza Virus 1 NOT DETECTED NOT DETECTED Final   Parainfluenza Virus 2 NOT DETECTED NOT DETECTED Final   Parainfluenza Virus 3 NOT DETECTED NOT DETECTED Final   Parainfluenza Virus 4 NOT DETECTED NOT DETECTED Final   Respiratory Syncytial Virus NOT DETECTED NOT DETECTED Final   Bordetella pertussis NOT DETECTED NOT DETECTED Final   Chlamydophila pneumoniae NOT DETECTED NOT DETECTED Final   Mycoplasma pneumoniae NOT DETECTED NOT DETECTED Final    Comment: Performed at [facility], [place] [address] [place], [place] [address]     Labs: Basic Metabolic Panel: Recent Labs  Lab 12/31/17 0602 01/01/18 0504 01/02/18 0000 01/02/18 0324 01/03/18 0410 01/04/18 0622 01/05/18 0409  NA  --  142 143  --  145 136 140  K  --  4.3 4.4  --  3.8 3.8 4.0  CL  --  103 104  --  108 98 103  CO2  --  32 30  --  25 28 29   GLUCOSE  --  164* 152*  --  179* 165* 145*  BUN  --  26* 35*  --   40* 27* 26*  CREATININE  --  0.58 0.67  --  0.73 0.58 0.70  CALCIUM  --  8.1* 8.1*  --  8.5* 8.8* 8.8*  MG 2.6* 3.0*  --  2.8* 2.8*  --   --   PHOS 2.5 2.2*  --  2.6 2.0*  --   --    Liver Function Tests: Recent Labs  Lab 12/31/17 0602 01/02/18 0324 01/03/18 0410  AST 51* 90* 58*  ALT 29 60* 70*  ALKPHOS 48 45 44  BILITOT 0.6 0.9 0.8  PROT 6.4* 7.1 7.0  ALBUMIN 3.2* 3.4* 3.2*   No results for input(s): LIPASE, AMYLASE in the last 168 hours. No results for input(s): AMMONIA in the last 168 hours. CBC: Recent Labs  Lab 12/31/17 0602 01/01/18 0504 01/02/18 0324 01/03/18 0410 01/04/18 0622 01/05/18 0409 01/06/18 0334  WBC 25.7* 24.8* 17.1* 21.9* 25.6* 21.9* 16.8*  NEUTROABS 23.4* 22.3* 15.6*  --   --  19.0* 14.8*  HGB 9.1* 9.6* 9.0* 10.2* 11.2* 11.1* 10.5*  HCT 31.7* 33.1* 31.0* 33.9* 36.4 35.7* 34.3*  MCV 70.8* 70.7* 70.5* 66.9* 66.4* 67.0* 67.1*  PLT 271 328 272 324 338 343 311   Cardiac Enzymes: Recent Labs  Lab 12/30/17 1032 12/30/17 2051 12/31/17 0602 01/02/18 0324 01/04/18 0622  CKTOTAL 711* 2,235* 2,623* 2,457* 690*  CKMB 21.2*  --  13.6* 31.3*  --    BNP: BNP (last 3 results) Recent Labs    12/29/17 0426  BNP 7.7    ProBNP (last 3 results) No results for input(s): PROBNP in the last 8760 hours.  CBG: Recent Labs  Lab 01/05/18 1640 01/05/18 2020 01/05/18 2350 01/06/18 0442 01/06/18 0759  GLUCAP 183* 219* 179* 160* 141*       Signed:  Kayleen Memos, MD [facility] 01/06/2018, 10:15 AM

## 2018-01-06 NOTE — Care Management Note (Signed)
Case Management Note  Patient Details  Name: Jenna Stephens MRN: [id number] Date of Birth: 06/29/1975  Subjective/Objective: Asthma                    Action/Plan: Discharge home with [facility]   Expected Discharge Date:  01/06/18               Expected Discharge Plan:  [place]  In-House Referral:     Discharge planning Services  CM Consult  Post Acute Care Choice:  Durable Medical Equipment Choice offered to:     DME Arranged:  Nebulizer machine DME Agency:  [place]:  RN, Social Work [employer] Agency:  [place]  Status of Service:  Completed, signed off  If discussed at [employer], dates discussed:    Additional CommentsPurcell Mouton, RN 01/06/2018, 11:59 AM

## 2018-01-06 NOTE — Progress Notes (Signed)
Pt's PCP Is Dr. Docia Barrier [phone], [facility]. Pt will discharge home with [place] for [facility].

## 2018-01-06 NOTE — Discharge Instructions (Signed)
Asthma, Adult °Asthma is a condition of the lungs in which the airways tighten and narrow. Asthma can make it hard to breathe. Asthma cannot be cured, but medicine and lifestyle changes can help control it. Asthma may be started (triggered) by: °· Animal skin flakes (dander). °· Dust. °· Cockroaches. °· Pollen. °· Mold. °· Smoke. °· Cleaning products. °· Hair sprays or aerosol sprays. °· Paint fumes or strong smells. °· Cold air, weather changes, and winds. °· Crying or laughing hard. °· Stress. °· Certain medicines or drugs. °· Foods, such as dried fruit, potato chips, and sparkling grape juice. °· Infections or conditions (colds, flu). °· Exercise. °· Certain medical conditions or diseases. °· Exercise or tiring activities. ° °Follow these instructions at home: °· Take medicine as told by your doctor. °· Use a peak flow meter as told by your doctor. A peak flow meter is a tool that measures how well the lungs are working. °· Record and keep track of the peak flow meter's readings. °· Understand and use the asthma action plan. An asthma action plan is a written plan for taking care of your asthma and treating your attacks. °· To help prevent asthma attacks: °? Do not smoke. Stay away from secondhand smoke. °? Change your heating and air conditioning filter often. °? Limit your use of fireplaces and wood stoves. °? Get rid of pests (such as roaches and mice) and their droppings. °? Throw away plants if you see mold on them. °? Clean your floors. Dust regularly. Use cleaning products that do not smell. °? Have someone vacuum when you are not home. Use a vacuum cleaner with a HEPA filter if possible. °? Replace carpet with wood, tile, or vinyl flooring. Carpet can trap animal skin flakes and dust. °? Use allergy-proof pillows, mattress covers, and box spring covers. °? Wash bed sheets and blankets every week in hot water and dry them in a dryer. °? Use blankets that are made of polyester or cotton. °? Clean bathrooms  and kitchens with bleach. If possible, have someone repaint the walls in these rooms with mold-resistant paint. Keep out of the rooms that are being cleaned and painted. °? Wash hands often. °Contact a doctor if: °· You have make a whistling sound when breaking (wheeze), have shortness of breath, or have a cough even if taking medicine to prevent attacks. °· The colored mucus you cough up (sputum) is thicker than usual. °· The colored mucus you cough up changes from clear or white to yellow, green, gray, or bloody. °· You have problems from the medicine you are taking such as: °? A rash. °? Itching. °? Swelling. °? Trouble breathing. °· You need reliever medicines more than 2-3 times a week. °· Your peak flow measurement is still at 50-79% of your personal best after following the action plan for 1 hour. °· You have a fever. °Get help right away if: °· You seem to be worse and are not responding to medicine during an asthma attack. °· You are short of breath even at rest. °· You get short of breath when doing very little activity. °· You have trouble eating, drinking, or talking. °· You have chest pain. °· You have a fast heartbeat. °· Your lips or fingernails start to turn blue. °· You are light-headed, dizzy, or faint. °· Your peak flow is less than 50% of your personal best. °This information is not intended to replace advice given to you by your health care provider. Make   sure you discuss any questions you have with your health care provider. °Document Released: 09/26/2007 Document Revised: 09/15/2015 Document Reviewed: 11/06/2012 °Elsevier Interactive Patient Education © 2017 Else[employer] Inc[employer] ° ° °Asthma Attack Prevention, Adult °Although you may not be able to control the fact that you have asthma, you can take actions to prevent episodes of asthma (asthma attacks). These actions include: °· Creating a written plan for managing and treating your asthma attacks (asthma action plan). °· Monitoring your  asthma. °· Avoiding things that can irritate your airways or make your asthma symptoms worse (asthma triggers). °· Taking your medicines as directed. °· Acting quickly if you have signs or symptoms of an asthma attack. ° °What are some ways to prevent an asthma attack? °Create a plan °Work with your health care provider to create an asthma action plan. This plan should include: °· A list of your asthma triggers and how to avoid them. °· A list of symptoms that you experience during an asthma attack. °· Information about when to take medicine and how much medicine to take. °· Information to help you understand your peak flow measurements. °· Contact information for your health care providers. °· Daily actions that you can take to control asthma. ° °Monitor your asthma ° °To monitor your asthma: °· Use your peak flow meter every morning and every evening for 2-3 weeks. Record the results in a journal. A drop in your peak flow numbers on one or more days may mean that you are starting to have an asthma attack, even if you are not having symptoms. °· When you have asthma symptoms, write them down in a journal. ° °Avoid asthma triggers ° °Work with your health care provider to find out what your asthma triggers are. This can be done by: °· Being tested for allergies. °· Keeping a journal that notes when asthma attacks occur and what may have contributed to them. °· Asking your health care provider whether other medical conditions make your asthma worse. ° °Common asthma triggers include: °· Dust. °· Smoke. This includes campfire smoke and secondhand smoke from tobacco products. °· Pet dander. °· Trees, grasses or pollens. °· Very cold, dry, or humid air. °· Mold. °· Foods that contain high amounts of sulfites. °· Strong smells. °· Engine exhaust and air pollution. °· Aerosol sprays and fumes from household cleaners. °· Household pests and their droppings, including dust mites and cockroaches. °· Certain medicines,  including NSAIDs. ° °Once you have determined your asthma triggers, take steps to avoid them. Depending on your triggers, you may be able to reduce the chance of an asthma attack by: °· Keeping your home clean. Have someone dust and vacuum your home for you 1 or 2 times a week. If possible, have them use a high-efficiency particulate arrestance (HEPA) vacuum. °· Washing your sheets weekly in hot water. °· Using allergy-proof mattress covers and casings on your bed. °· Keeping pets out of your home. °· Taking care of mold and water problems in your home. °· Avoiding areas where people smoke. °· Avoiding using strong perfumes or odor sprays. °· Avoid spending a lot of time outdoors when pollen counts are high and on very windy days. °· Talking with your health care provider before stopping or starting any new medicines. ° °Medicines °Take over-the-counter and prescription medicines only as told by your health care provider. Many asthma attacks can be prevented by carefully following your medicine schedule. Taking your medicines correctly is especially important when you   cannot avoid certain asthma triggers. Even if you are doing well, do not stop taking your medicine and do not take less medicine. Act quickly If an asthma attack happens, acting quickly can decrease how severe it is and how long it lasts. Take these actions:  Pay attention to your symptoms. If you are coughing, wheezing, or having difficulty breathing, do not wait to see if your symptoms go away on their own. Follow your asthma action plan.  If you have followed your asthma action plan and your symptoms are not improving, call your health care provider or seek immediate medical care at the nearest hospital.  It is important to write down how often you need to use your fast-acting rescue inhaler. You can track how often you use an inhaler in your journal. If you are using your rescue inhaler more often, it may mean that your asthma is not under  control. Adjusting your asthma treatment plan may help you to prevent future asthma attacks and help you to gain better control of your condition. How can I prevent an asthma attack when I exercise?  Exercise is a common asthma trigger. To prevent asthma attacks during exercise:  Follow advice from your health care provider about whether you should use your fast-acting inhaler before exercising. Many people with asthma experience exercise-induced bronchoconstriction (EIB). This condition often worsens during vigorous exercise in cold, humid, or dry environments. Usually, people with EIB can stay very active by using a fast-acting inhaler before exercising.  Avoid exercising outdoors in very cold or humid weather.  Avoid exercising outdoors when pollen counts are high.  Warm up and cool down when exercising.  Stop exercising right away if asthma symptoms start.  Consider taking part in exercises that are less likely to cause asthma symptoms such as:  Indoor swimming.  Biking.  Walking.  Hiking.  Playing football.  This information is not intended to replace advice given to you by your health care provider. Make sure you discuss any questions you have with your health care provider. Document Released: 03/28/2009 Document Revised: 12/09/2015 Document Reviewed: 09/24/2015 Elsevier Interactive Patient Education  2018 [place], Adult Introduction Name: ________________________________ Date: _______ Follow-Up Visit With Health Care Provider Bring your medicines to your follow-up visits.  Health care provider name: ____________________  Telephone: ____________________  How often should I see my health care provider? ____________________  The actions that you should take to control your asthma are based on the symptoms that you are having. The condition can be divided into 3 zones: the green zone, yellow zone, and red zone. Follow the action steps for the  zone that you are in each day. Green zone: when asthma is under control  Signs and symptoms You may not have any symptoms while you are in the green zone. This means that you:  Have no coughing or wheezing, even while you are working or playing.  Sleep through the night.  Are breathing well.  If you use a peak flow meter: The peak flow is above ______ (80% of your personal best or greater). You should take these medicines every day:  Controller medicine and dosage: ________________  Controller medicine and dosage: ________________  Controller medicine and dosage: ________________  Controller medicine and dosage: ________________  Before exercise, use this reliever or rescue medicine: ________________  Call your health care provider if:  You are using a reliever or rescue medicine more than 2-3 times per week.  Yellow zone: when  asthma is getting worse  Signs and symptoms When you are in the yellow zone, you may have symptoms that interfere with exercise, are noticeably worse after exposure to triggers, or are worse at the first sign of a cold (upper respiratory infection). These may include:  Waking from sleep.  Coughing, especially at night or first thing in the morning.  Mild wheezing.  Chest tightness.  If you use a peak flow meter: The peak flow is _____ to _____ (50-79% of your personal best). Add the following medicine to the ones that you use daily:  Reliever or rescue medicine and dosage: ________________  Additional medicine and dosage: ________________  Call your health care provider if:  You are using a reliever or rescue medicine more than 2-3 times per week.  You remain in the yellow zone for _____ hours.  Red zone: when asthma is severe  Signs and symptoms You will likely feel distressed and have symptoms at rest that restrict your activity. You are in the red zone if:  Your breathing is hard and quickly.  Your nose opens wide, your ribs  show, and your neck muscles become visible when you breathe in.  Your lips, fingers, or toes are a bluish color.  You have trouble speaking in full sentences.  Your symptoms do not improve within 15-20 minutes after you use your reliever or rescue medicine (bronchodilator).  If you use a peak flow meter: The peak flow is less than _____ (less than 50% of your personal best). Call your local emergency services (911 in the [place] right away or seek help at the emergency department of the nearest hospital. Use your reliever or rescue medicine.  Start a nebulizer treatment or take 2-4 puffs from a metered-dose inhaler with a spacer.  Repeat this action every 15-20 minutes until help arrives.  What are some common asthma triggers? Discuss your asthma triggers with your health care provider. Some common triggers are:  Dander from the skin, hair, or feathers of animals.  Dust mites.  Cockroaches.  Pollen from trees or grass.  Mold.  Cigarette or tobacco smoke.  Air pollutants, such as dust, household cleaners, hair sprays, aerosol sprays, scented candles, paint fumes, strong chemicals, or strong odors.  Cold air or changes in weather. Cold air may cause inflammation. Winds increase molds and pollens in the air.  Strong emotions, such as crying or laughing hard.  Stress.  Certain medicines, such as aspirin or beta blockers.  Sulfites in foods and drinks, such as dried fruits and wine.  Infections or inflammatory conditions, such as: ? Flu (influenza). ? Upper respiratory tract infection. ? Lower respiratory tract infection (pneumonia or bronchitis). ? Inflammation of the nasal membranes (rhinitis).  Gastroesophageal reflux disease (GERD). GERD is a condition in which stomach acid comes up into the throat (esophagus).  Exercise or strenuous activity.  This information is not intended to replace advice given to you by your health care provider. Make sure you discuss any  questions you have with your health care provider. Document Released: 02/04/2009 Document Revised: 12/05/2015 Document Reviewed: 01/26/2014 Elsevier Interactive Patient Education  2018 [employer].

## 2018-01-06 NOTE — Progress Notes (Signed)
Physical Therapy Treatment Patient Details Name: Jenna Stephens MRN: [id number] DOB: [birth year] Today's Date: 01/06/2018    History of Present Illness Jenna Stephens is a 42 y.o. female with medical history significant for hypertension, tobacco abuse, marijuana use presents to the ER with 3 days of nonproductive cough, significant shortness of breath with associated wheezing and chest tightness and diagnosed with Acute hypoxic hypercarbic respiratory failure due to bronchial asthma exacerbation status post intubation (extubated 01/03/18.)    PT Comments    Pt reports feeling much better.  Pt assisted with ambulating in hallway and tolerated good distance with improved saturations.  HR elevated to 130 bpm so RN notified.  Pt with possible d/c home today.   Follow Up Recommendations  No PT follow up     Equipment Recommendations  None recommended by PT    Recommendations for Other Services       Precautions / Restrictions Precautions Precautions: Fall Precaution Comments: monitor sats and HR    Mobility  Bed Mobility               General bed mobility comments: pt up in recliner  Transfers Overall transfer level: Needs assistance Equipment used: Rolling Vonderhaar (2 wheeled) Transfers: Sit to/from Stand Sit to Stand: Supervision            Ambulation/Gait Ambulation/Gait assistance: Supervision Gait Distance (Feet): 400 Feet Assistive device: None Gait Pattern/deviations: Step-through pattern     General Gait Details: pt pushed dynamap however did not require for UE support, SPO2 remained 96-99% on room air however HR elevated to 130 bpm (RN notified)   Stairs             Wheelchair Mobility    Modified Rankin (Stroke Patients Only)       Balance                                            Cognition Arousal/Alertness: Awake/alert Behavior During Therapy: WFL for tasks assessed/performed Overall Cognitive Status: Within  Functional Limits for tasks assessed                                        Exercises      General Comments        Pertinent Vitals/Pain Pain Assessment: No/denies pain    Home Living                      Prior Function            PT Goals (current goals can now be found in the care plan section) Progress towards PT goals: Progressing toward goals    Frequency    Min 3X/week      PT Plan Discharge plan needs to be updated    Co-evaluation              AM-PAC PT "6 Clicks" Daily Activity  Outcome Measure  Difficulty turning over in bed (including adjusting bedclothes, sheets and blankets)?: None Difficulty moving from lying on back to sitting on the side of the bed? : A Little Difficulty sitting down on and standing up from a chair with arms (e.g., wheelchair, bedside commode, etc,.)?: A Little Help needed moving to and from a bed to chair (including a wheelchair)?:  A Little Help needed walking in hospital room?: A Little Help needed climbing 3-5 steps with a railing? : A Little 6 Click Score: 19    End of Session   Activity Tolerance: Patient tolerated treatment well Patient left: in chair;with call bell/phone within reach;with family/visitor present Nurse Communication: Mobility status PT Visit Diagnosis: Difficulty in walking, not elsewhere classified (R26.2)     Time: 2840-6986 PT Time Calculation (min) (ACUTE ONLY): 9 min  Charges:  $Gait Training: 8-22 mins                     Carmelia Bake, PT, DPT Acute Rehabilitation Services Office: [phone] Pager: [phone]  Trena Platt 01/06/2018, 12:31 PM

## 2018-01-09 ENCOUNTER — Telehealth: Payer: Self-pay | Admitting: Pulmonary Disease

## 2018-01-09 ENCOUNTER — Encounter: Payer: Self-pay | Admitting: Pulmonary Disease

## 2018-01-09 ENCOUNTER — Ambulatory Visit: Payer: Self-pay | Admitting: Pulmonary Disease

## 2018-01-09 ENCOUNTER — Ambulatory Visit (INDEPENDENT_AMBULATORY_CARE_PROVIDER_SITE_OTHER): Payer: Self-pay | Admitting: Pulmonary Disease

## 2018-01-09 VITALS — BP 134/78 | HR 102 | Ht 66.0 in | Wt 261.4 lb

## 2018-01-09 DIAGNOSIS — R0602 Shortness of breath: Secondary | ICD-10-CM

## 2018-01-09 LAB — NITRIC OXIDE: Nitric Oxide: 5

## 2018-01-09 MED ORDER — FLUTICASONE FUROATE-VILANTEROL 200-25 MCG/INH IN AEPB: 1.0000 | INHALATION_SPRAY | Freq: Every day | RESPIRATORY_TRACT | 5 refills | Status: DC

## 2018-01-09 MED ORDER — FLUTICASONE FUROATE-VILANTEROL 200-25 MCG/INH IN AEPB: 1.0000 | INHALATION_SPRAY | Freq: Every day | RESPIRATORY_TRACT | 0 refills | Status: AC

## 2018-01-09 NOTE — Progress Notes (Signed)
Jenna Stephens    [id number]    [birth year]  Primary Care Physician:Patient, No Pcp Per  Referring Physician: No referring provider defined for this encounter.  Chief complaint: Follow-up after hospitalization  HPI: 42 year old with history of hypertension.  Admitted to [facility] on 12/29/2017 with exacerbation and status asthmaticus in the setting of rhinovirus infection.  She required intubation, paralysis.  Discharged on 9/16  Here for follow-up after hospitalization. Her sister has been diagnosed with strep throat this week and she is concerned Reports swelling of the upper lip.  No cough, fevers, sputum production, stridor Breathing is stable.  She is only on Atrovent and Xopenex nebulizer.  She has not been discharged on a controller inhaler  Pets: Has 2 [place] pigs, no cats, dogs, birds or farm animals Occupation: Works in [employer] support for [employer] Exposures: Reports that bathroom has a leak however no known mold, no hot tub, Jacuzzi Smoking history: Smokes 2 cigarettes a day, smokes marijuana, no vaping Travel history: No significant travel history Relevant family history: Mother has asthma  Outpatient Encounter Medications as of 01/09/2018  Medication Sig  . cloNIDine (CATAPRES) 0.2 MG tablet Take 1 tablet (0.2 mg total) by mouth 2 (two) times daily.  Marland Kitchen ipratropium (ATROVENT) 0.02 % nebulizer solution Take 2.5 mLs (0.5 mg total) by nebulization 3 (three) times daily.  Marland Kitchen levalbuterol (XOPENEX) 1.25 MG/0.5ML nebulizer solution Take 1.25 mg by nebulization 3 (three) times daily.  . montelukast (SINGULAIR) 10 MG tablet Take 1 tablet (10 mg total) by mouth at bedtime.  . Multiple Vitamin (MULTIVITAMIN WITH MINERALS) TABS tablet Take 1 tablet by mouth daily.  . pantoprazole (PROTONIX) 40 MG tablet Take 1 tablet (40 mg total) by mouth daily.  . predniSONE (DELTASONE) 10 MG tablet Take 1 tablet (10 mg total) by mouth daily with breakfast for 5 days.  Marland Kitchen  rOPINIRole (REQUIP) 2 MG tablet Take 2 mg by mouth at bedtime.  . sertraline (ZOLOFT) 50 MG tablet Take 50 mg by mouth daily.   No facility-administered encounter medications on file as of 01/09/2018.    Physical Exam: unknown if currently breastfeeding. Gen:      No acute distress HEENT:  EOMI, sclera anicteric, healing abrasion over philtrum of upper lip, ulceration over the mucosa in the inner side of upper lip. Neck:     No masses; no thyromegaly Lungs:    Clear to auscultation bilaterally; normal respiratory effort CV:         Regular rate and rhythm; no murmurs Abd:      + bowel sounds; soft, non-tender; no palpable masses, no distension Ext:    No edema; adequate peripheral perfusion Skin:      Warm and dry; no rash Neuro: alert and oriented x 3 Psych: normal mood and affect  Data Reviewed: Imaging: Chest x-ray 9/8-no acute abnormality CTA 9/8- no pulmonary embolism in the main pulmonary arteries, mild nodular patchy opacities in the left apex.  I have reviewed the images personally.  PFTs:  FENO 01/09/18-5  Labs: CBC 12/29/2017-WBC 19.7, eos 9%, absolute eosinophil count 1773 Blood allergy profile 12/30/2017-IgE 214, RAST panel shows sensitivity to dust mite, cat, dog, tree, grass pollen, mouse urine  Assessment:  Acute asthma exacerbation Follow-up after hospitalization.  She is making a slow recovery FENO is low but she is still on steroids.  Prednisone taper is due to finish this week  She currently just using Atrovent, Xopenex nebs.  Will  start Breo inhaler Continue Singulair Schedule PFTs  GERD Continue Protonix   Upper lip swelling Examination shows upper lip trauma and ulceration, likely from endotracheal tube and holder There is no evidence of throat swelling or airway compromise Although her sister was diagnosed with strep throat the suspicion is low as there is no fevers, chills Reassured the patient.  We will continue to observe.  Plan/Recommendations: -  [employer], continue Atrovent, Xopenex nebs as needed - Schedule PFTs, follow-up in 2 weeks.  Marshell Garfinkel MD [facility] 01/09/2018, 2:57 PM  CC: No ref. provider found

## 2018-01-09 NOTE — Telephone Encounter (Signed)
Spoke with pt, she states she woke up this am with a sore throat and her lip very swollen. She states when she was in the hospital, they her the reason her lip is swollen is because of her airway being constricted. They asked if there was an opening for an office visit so then I scheduled her to see Dr, Vaughan Browner at 3:15pm. Nothing further is needed.

## 2018-01-09 NOTE — Patient Instructions (Signed)
I am glad your breathing is better after hospitalization We will continue to observe the swelling in your upper lip.  Please call us if this worsens or it causes any throat constriction or difficulty breathing Continue the prednisone as directed Continue the nebulizer as needed We will start you on an inhaler called Breo We will schedule you for pulmonary function test Follow-up in 2 weeks as scheduled.

## 2018-01-13 ENCOUNTER — Telehealth: Payer: Self-pay | Admitting: Pulmonary Disease

## 2018-01-13 NOTE — Telephone Encounter (Signed)
Called and spoke with patient, advised patient that we could fill out patient assistance forms and send them in. As well as give her a sample. Patient verbalized understanding. Placing forms up front for patient to come in and fill out. Sample placed up front as well. Will route to [facility] to follow up on.

## 2018-01-15 NOTE — Telephone Encounter (Signed)
Dr. Mannam please advise on patients e-mail below. Thank you.  

## 2018-01-16 NOTE — Telephone Encounter (Signed)
FENO of 5 is a good test result. It means that the inflammation in lungs is under control.

## 2018-01-20 ENCOUNTER — Telehealth: Payer: Self-pay

## 2018-01-20 ENCOUNTER — Emergency Department (HOSPITAL_COMMUNITY)
Admission: EM | Admit: 2018-01-20 | Discharge: 2018-01-20 | Disposition: A | Discharge status: Home / Self Care | Payer: Medicaid Other | Attending: Emergency Medicine | Admitting: Emergency Medicine

## 2018-01-20 ENCOUNTER — Emergency Department (HOSPITAL_COMMUNITY): Payer: Medicaid Other

## 2018-01-20 ENCOUNTER — Encounter (HOSPITAL_COMMUNITY): Payer: Self-pay | Admitting: Emergency Medicine

## 2018-01-20 ENCOUNTER — Ambulatory Visit: Payer: Self-pay | Admitting: Pulmonary Disease

## 2018-01-20 DIAGNOSIS — F17200 Nicotine dependence, unspecified, uncomplicated: Secondary | ICD-10-CM | POA: Diagnosis not present

## 2018-01-20 DIAGNOSIS — J45909 Unspecified asthma, uncomplicated: Secondary | ICD-10-CM | POA: Insufficient documentation

## 2018-01-20 DIAGNOSIS — I10 Essential (primary) hypertension: Secondary | ICD-10-CM | POA: Insufficient documentation

## 2018-01-20 DIAGNOSIS — R519 Headache, unspecified: Secondary | ICD-10-CM

## 2018-01-20 DIAGNOSIS — Z79899 Other long term (current) drug therapy: Secondary | ICD-10-CM | POA: Diagnosis not present

## 2018-01-20 DIAGNOSIS — R51 Headache: Secondary | ICD-10-CM | POA: Insufficient documentation

## 2018-01-20 DIAGNOSIS — R1110 Vomiting, unspecified: Secondary | ICD-10-CM | POA: Diagnosis not present

## 2018-01-20 DIAGNOSIS — R112 Nausea with vomiting, unspecified: Secondary | ICD-10-CM

## 2018-01-20 LAB — COMPREHENSIVE METABOLIC PANEL
ALT: 23 U/L (ref 0–44)
AST: 16 U/L (ref 15–41)
Albumin: 3.9 g/dL (ref 3.5–5.0)
Alkaline Phosphatase: 51 U/L (ref 38–126)
Anion gap: 13 (ref 5–15)
BUN: 14 mg/dL (ref 6–20)
CHLORIDE: 107 mmol/L (ref 98–111)
CO2: 22 mmol/L (ref 22–32)
Calcium: 9.8 mg/dL (ref 8.9–10.3)
Creatinine, Ser: 0.83 mg/dL (ref 0.44–1.00)
GFR calc Af Amer: >60 mL/min (ref >60)
Glucose, Bld: 142 mg/dL — ABNORMAL HIGH (ref 70–99)
POTASSIUM: 3.5 mmol/L (ref 3.5–5.1)
SODIUM: 142 mmol/L (ref 135–145)
Total Bilirubin: 0.7 mg/dL (ref 0.3–1.2)
Total Protein: 7.3 g/dL (ref 6.5–8.1)

## 2018-01-20 LAB — URINALYSIS, ROUTINE W REFLEX MICROSCOPIC
Bacteria, UA: NONE SEEN
Bilirubin Urine: NEGATIVE
Glucose, UA: NEGATIVE mg/dL
Hgb urine dipstick: NEGATIVE
KETONES UR: NEGATIVE mg/dL
Leukocytes, UA: NEGATIVE
Nitrite: NEGATIVE
PROTEIN: 30 mg/dL — AB
Specific Gravity, Urine: 1.025 (ref 1.005–1.030)
pH: 7 (ref 5.0–8.0)

## 2018-01-20 LAB — CBC
HEMATOCRIT: 34.9 % — AB (ref 36.0–46.0)
Hemoglobin: 10.7 g/dL — ABNORMAL LOW (ref 12.0–15.0)
MCH: 20.8 pg — AB (ref 26.0–34.0)
MCHC: 30.7 g/dL (ref 30.0–36.0)
MCV: 67.9 fL — AB (ref 78.0–100.0)
PLATELETS: 365 10*3/uL (ref 150–400)
RBC: 5.14 MIL/uL — ABNORMAL HIGH (ref 3.87–5.11)
RDW: 18.6 % — AB (ref 11.5–15.5)
WBC: 16.1 10*3/uL — ABNORMAL HIGH (ref 4.0–10.5)

## 2018-01-20 LAB — LIPASE, BLOOD: LIPASE: 40 U/L (ref 11–51)

## 2018-01-20 LAB — TROPONIN I: Troponin I: <0.03 ng/mL (ref <0.03)

## 2018-01-20 LAB — I-STAT BETA HCG BLOOD, ED (MC, WL, AP ONLY)

## 2018-01-20 MED ORDER — SODIUM CHLORIDE 0.9 % IV BOLUS
1000.0000 mL | Freq: Once | INTRAVENOUS | Status: AC
  Administered 2018-01-20: 1000 mL via INTRAVENOUS

## 2018-01-20 MED ORDER — KETOROLAC TROMETHAMINE 30 MG/ML IJ SOLN
30.0000 mg | Freq: Once | INTRAMUSCULAR | Status: AC
  Administered 2018-01-20: 30 mg via INTRAVENOUS
  Filled 2018-01-20: qty 1

## 2018-01-20 MED ORDER — FLUTICASONE FUROATE-VILANTEROL 200-25 MCG/INH IN AEPB: 1.0000 | INHALATION_SPRAY | Freq: Every day | RESPIRATORY_TRACT | 5 refills | Status: DC

## 2018-01-20 MED ORDER — ONDANSETRON HCL 4 MG PO TABS: 4.0000 mg | ORAL_TABLET | Freq: Three times a day (TID) | ORAL | 0 refills | Status: AC | PRN

## 2018-01-20 MED ORDER — METOCLOPRAMIDE HCL 5 MG/ML IJ SOLN
10.0000 mg | Freq: Once | INTRAMUSCULAR | Status: AC
  Administered 2018-01-20: 10 mg via INTRAVENOUS
  Filled 2018-01-20: qty 2

## 2018-01-20 MED ORDER — DIPHENHYDRAMINE HCL 50 MG/ML IJ SOLN
25.0000 mg | Freq: Once | INTRAMUSCULAR | Status: AC
  Administered 2018-01-20: 25 mg via INTRAVENOUS
  Filled 2018-01-20: qty 1

## 2018-01-20 NOTE — Discharge Instructions (Addendum)
Your lab work and cardiac work-up today look great!   Your vomiting is likely from a viral GI bug. This should pass over the next 1-2 days. I have prescribed you an anti-nausea medication, Zofran, if you still have nausea at home.  Be sure to stay hydrated!  Thank you for allowing me to take care of you today!

## 2018-01-20 NOTE — Telephone Encounter (Signed)
Patient's mother stopped by office to obtain samples of Breo and patient assistance application. Samples given and patient assistance given as well. Instructed patients mother to fill out form and bring back to office. Nothing further needed at this time.

## 2018-01-20 NOTE — ED Provider Notes (Signed)
[facility] DEPT Provider Note  CSN: [id number] Arrival date & time: 01/20/18  5993  History   Chief Complaint Chief Complaint  Patient presents with  . Emesis  . Headache   HPI Jenna Stephens is a 42 y.o. female with a medical history of HTN and asthma who presented to the ED for vomiting. She reports that this began this morning and that she has vomited multiple times since waking up. Patient states she has not had any new exposures, food/ingestion or recent travel. Denies alcohol use. Denies fever, chills, diarrhea/constipation or GU complaints. Denies chest pain, SOB, palpitations, diaphoresis or dizziness.  Patient also complains of frontal, throbbing headache. Denies vision changes, neck pain, chills, skin rashes/lesions, arthralgias, paresthesias or weakness. She reports that headache began after the vomiting.  Past Medical History:  Diagnosis Date  . Hypertension     Patient Active Problem List   Diagnosis Date Noted  . Asthma with status asthmaticus   . Acute respiratory failure ([place]) 12/29/2017  . HTN (hypertension) 12/29/2017  . Obesity 12/29/2017    Past Surgical History:  Procedure Laterality Date  . TONSILLECTOMY       OB History    Gravida  5   Para  2   Term  2   Preterm  0   AB  2   Living        SAB  2   TAB  0   Ectopic  0   Multiple      Live Births               Home Medications    Prior to Admission medications   Medication Sig Start Date End Date Taking? Authorizing Provider  cloNIDine (CATAPRES) 0.2 MG tablet Take 1 tablet (0.2 mg total) by mouth 2 (two) times daily. 01/06/18  Yes Hall, Carole N, DO  fluticasone furoate-vilanterol (BREO ELLIPTA) 200-25 MCG/INH AEPB Inhale 1 puff into the lungs daily. 01/09/18  Yes Mannam, Praveen, MD  ipratropium (ATROVENT) 0.02 % nebulizer solution Take 2.5 mLs (0.5 mg total) by nebulization 3 (three) times daily. 01/06/18  Yes Hall, Carole N, DO    levalbuterol (XOPENEX) 1.25 MG/0.5ML nebulizer solution Take 1.25 mg by nebulization 3 (three) times daily. 01/06/18  Yes Hall, Carole N, DO  montelukast (SINGULAIR) 10 MG tablet Take 1 tablet (10 mg total) by mouth at bedtime. 01/06/18  Yes Kayleen Memos, DO  Multiple Vitamin (MULTIVITAMIN WITH MINERALS) TABS tablet Take 1 tablet by mouth daily. 01/06/18  Yes Hall, Carole N, DO  pantoprazole (PROTONIX) 40 MG tablet Take 1 tablet (40 mg total) by mouth daily. 01/06/18  Yes Hall, Carole N, DO  rOPINIRole (REQUIP) 2 MG tablet Take 2 mg by mouth at bedtime.   Yes [provider]  sertraline (ZOLOFT) 50 MG tablet Take 50 mg by mouth daily.   Yes [provider]  traMADol (ULTRAM) 50 MG tablet Take 50 mg by mouth every 6 (six) hours as needed (states she was instructed to take every 4 hours as needed).   Yes [provider]  ondansetron (ZOFRAN) 4 MG tablet Take 1 tablet (4 mg total) by mouth every 8 (eight) hours as needed for up to 5 days for nausea or vomiting. 01/20/18 01/25/18  Mortis, Jonelle Sports, PA-C    Family History Family History  Problem Relation Age of Onset  . Diabetes Mother   . Hypertension Mother     Social History Social History   Tobacco  Use  . Smoking status: Light Tobacco Smoker    Packs/day: 0.25  . Smokeless tobacco: Never Used  . Tobacco comment: stopped smoking after this hospital admission  Substance Use Topics  . Alcohol use: Yes    Comment: weekends only  . Drug use: Never    Types: Marijuana    Comment: last use September 24 2016     Allergies   Patient has no known allergies.   Review of Systems Review of Systems  Constitutional: Negative for chills, fatigue and fever.  HENT: Negative.   Eyes: Negative.   Respiratory: Negative for cough, chest tightness and shortness of breath.   Cardiovascular: Negative for chest pain, palpitations and leg swelling.  Gastrointestinal: Positive for abdominal pain, nausea and vomiting. Negative  for blood in stool, constipation and diarrhea.  Genitourinary: Negative.   Musculoskeletal: Negative.   Skin: Negative.   Neurological: Positive for light-headedness and headaches. Negative for dizziness, syncope, weakness and numbness.  Psychiatric/Behavioral: Negative for confusion and decreased concentration.     Physical Exam Updated Vital Signs BP 138/88   Pulse 91   Temp 97.8 F (36.6 C) (Oral)   Resp (!) 22   Ht 5\' 7"  (1.702 m)   Wt 113.9 kg   LMP 12/30/2017   SpO2 100%   BMI 39.34 kg/m   Physical Exam  Constitutional: She appears well-developed and well-nourished. She is cooperative. No distress.  HENT:  Head: Normocephalic and atraumatic.  Mouth/Throat: Uvula is midline, oropharynx is clear and moist and mucous membranes are normal.  Eyes: Pupils are equal, round, and reactive to light. Conjunctivae, EOM and lids are normal.  Neck: Normal range of motion and full passive range of motion without pain. Neck supple. No spinous process tenderness and no muscular tenderness present. Normal range of motion present.  Cardiovascular: Normal rate, regular rhythm and normal heart sounds.  No murmur heard. Pulmonary/Chest: Effort normal and breath sounds normal.  Abdominal: Soft. Normal appearance and bowel sounds are normal. There is no tenderness.  Musculoskeletal: Normal range of motion.  Neurological: She is alert. She has normal strength. She exhibits normal muscle tone.  Skin: Skin is warm and intact. Capillary refill takes less than 2 seconds.  Nursing note and vitals reviewed.  ED Treatments / Results  Labs (all labs ordered are listed, but only abnormal results are displayed) Labs Reviewed  COMPREHENSIVE METABOLIC PANEL - Abnormal; Notable for the following components:      Result Value   Glucose, Bld 142 (*)    All other components within normal limits  CBC - Abnormal; Notable for the following components:   WBC 16.1 (*)    RBC 5.14 (*)    Hemoglobin 10.7 (*)     HCT 34.9 (*)    MCV 67.9 (*)    MCH 20.8 (*)    RDW 18.6 (*)    All other components within normal limits  URINALYSIS, ROUTINE W REFLEX MICROSCOPIC - Abnormal; Notable for the following components:   Color, Urine AMBER (*)    APPearance HAZY (*)    Protein, ur 30 (*)    All other components within normal limits  LIPASE, BLOOD  TROPONIN I  I-STAT BETA HCG BLOOD, ED ([facility], [facility], [facility] ONLY)    EKG EKG Interpretation  Date/Time:  Monday January 20 2018 11:55:54 EDT Ventricular Rate:  81 PR Interval:    QRS Duration: 97 QT Interval:  359 QTC Calculation: 417 R Axis:   40 Text Interpretation:  Sinus rhythm Abnormal  R-wave progression, early transition T wave abnormality Abnormal ekg Confirmed by Carmin Muskrat [phone]) on 01/20/2018 12:24:51 PM   Radiology Dg Chest 2 View  Result Date: 01/20/2018 CLINICAL DATA:  History of respiratory failure, current smoker. Now complaining of headache and nausea and vomiting since last night. EXAM: CHEST - 2 VIEW COMPARISON:  Portable chest x-ray of January 04, 2018 FINDINGS: The lungs are adequately inflated. There is no focal infiltrate. There is no pleural effusion. The heart and pulmonary vascularity are normal. The mediastinum is normal in width. The bony thorax exhibits no acute abnormality. The bowel gas pattern in the upper abdomen is within the limits of normal. IMPRESSION: There is no active cardiopulmonary disease. Electronically Signed   By: David  Martinique M.D.   On: 01/20/2018 12:48    Procedures Procedures (including critical care time)  Medications Ordered in ED Medications  sodium chloride 0.9 % bolus 1,000 mL (0 mLs Intravenous Stopped 01/20/18 1329)  metoCLOPramide (REGLAN) injection 10 mg (10 mg Intravenous Given 01/20/18 1209)  diphenhydrAMINE (BENADRYL) injection 25 mg (25 mg Intravenous Given 01/20/18 1209)  ketorolac (TORADOL) 30 MG/ML injection 30 mg (30 mg Intravenous Given 01/20/18 1209)  sodium chloride 0.9 % bolus 1,000  mL (1,000 mLs Intravenous New Bag/Given 01/20/18 1431)     Initial Impression / Assessment and Plan / ED Course  Triage vital signs and the nursing notes have been reviewed.  Pertinent labs & imaging results that were available during care of the patient were reviewed and considered in medical decision making (see chart for details).  Patient presents afebrile with complaints of vomiting that began this morning with no precipitating symptoms or triggers. It is possible that this a viral etiology given its acute onset. She also complains of headache and lightheadedness which began after the vomiting. Possible that headache and lightheadedness is due to volume depletion. However, patient's history is concerning as she was recent admitted for respiratory failure 2 weeks which raises concern for an acute cardiac cause to complaints today.   Physical exam was unremarkable. There was no neuro deficits to suggest an intracranial etiology for headache. Abdominal exam also normal and did not have an acute findings to suggest an acute or emergent etiology for vomiting or the need for imaging.  Clinical Course as of Jan 20 1550  Mon Jan 20, 2018  1224 Leukocytosis at 16.1, but this is a downward trend from when patient was hospitalized 2 weeks ago. Remaining labs are unremarkable and consistent with pt's baseline.   [GM]  1257 EKG showed NSR. No ST elevations/depressions or signs of acute ischemia or infarct. This is reassuring in combination with negative troponin which assists in evaluating and ruling out an acute cardiac process.   [GM]  0630 CXR normal.   [GM]  1601 Patient re-evaluated after IV migraine cocktail and patient reports relief in nausea and headache.   [GM]  1549 UA not indicative of UTI.   [GM]    Clinical Course User Index [GM] Mortis, Jonelle Sports, PA-C    Final Clinical Impressions(s) / ED Diagnoses  1. Vomiting. Likely viral gastroenteritis. Education provided on OTC and  supportive treatment for relief. Rx for Zofran  2. Headache. Relief achieved with IV migraine cocktail. Headache possibly due to excess vomiting and volume depletion. Education provided on hydration and OTC/supportive treatment for  Dispo: Home. After thorough clinical evaluation, this patient is determined to be medically stable and can be safely discharged with the previously mentioned treatment and/or outpatient follow-up/referral(s). At  this time, there are no other apparent medical conditions that require further screening, evaluation or treatment.   Final diagnoses:  Non-intractable vomiting with nausea, unspecified vomiting type  Nonintractable headache, unspecified chronicity pattern, unspecified headache type    ED Discharge Orders         Ordered    ondansetron (ZOFRAN) 4 MG tablet  Every 8 hours PRN     01/20/18 1533            Mortis, Remington I, PA-C 01/20/18 1551    Carmin Muskrat, MD 01/24/18 2147

## 2018-01-20 NOTE — Telephone Encounter (Signed)
Forms have not been received as of yet. Called and spoke to pt.  Pt states that she plans to come by today to pick up samples and application.  She will bring completed application by our office to be faxed once finished.

## 2018-01-20 NOTE — ED Notes (Signed)
Unable to get blood via IV-phlebotomy to attempt

## 2018-01-20 NOTE — ED Triage Notes (Signed)
Pt c/o headache and vomiting since this morning. Denies diarrhea.

## 2018-01-24 ENCOUNTER — Telehealth: Payer: Self-pay | Admitting: Pulmonary Disease

## 2018-01-24 MED ORDER — AMLODIPINE BESYLATE 5 MG PO TABS: 5.0000 mg | ORAL_TABLET | Freq: Every day | ORAL | 11 refills | Status: DC

## 2018-01-24 MED ORDER — PREDNISONE 10 MG PO TABS: ORAL_TABLET | ORAL | 0 refills | Status: DC

## 2018-01-24 NOTE — Telephone Encounter (Signed)
Called and spoke to patient. Patient stated that her blood pressure is 145/90 and has a headache.  Patient stated she is having shortness of breath and chest tightness. Patient has been taking her clonidine 0.2mg  and using her nebulizer. Patient has an appointment with NP on 01/27/18 but wants to know if there is anything she should be doing over the weekend?   Dr. Vaughan Browner please advise.

## 2018-01-24 NOTE — Telephone Encounter (Signed)
Take the albuterol and xopenex nebs for wheezing Make sure she is using the breo inhaler.  Call in prednisone taper starting at 40 mg. Reduce dose by 10 mg every 3 days.  Call in norvasc 5mg /day for HTN. She needs to work on getting primary care physician.   Go to ED if symptoms worsen  Marshell Garfinkel MD [facility] 01/24/2018, 4:40 PM

## 2018-01-24 NOTE — Telephone Encounter (Signed)
Pharm is [facility] on [address].

## 2018-01-24 NOTE — Telephone Encounter (Signed)
Called and spoke with patient, she is aware and verbalized understanding. Medications sent in. Nothing further needed. 

## 2018-01-26 IMAGING — CR DG CHEST 2V
2 series · 2 of 2 positions shown · non-contrast
Comparison: 03/28/2010

CLINICAL DATA: Dizziness, weakness, lightheadedness, shortness of
breath

EXAM:
CHEST  2 VIEW

[w chest pa]
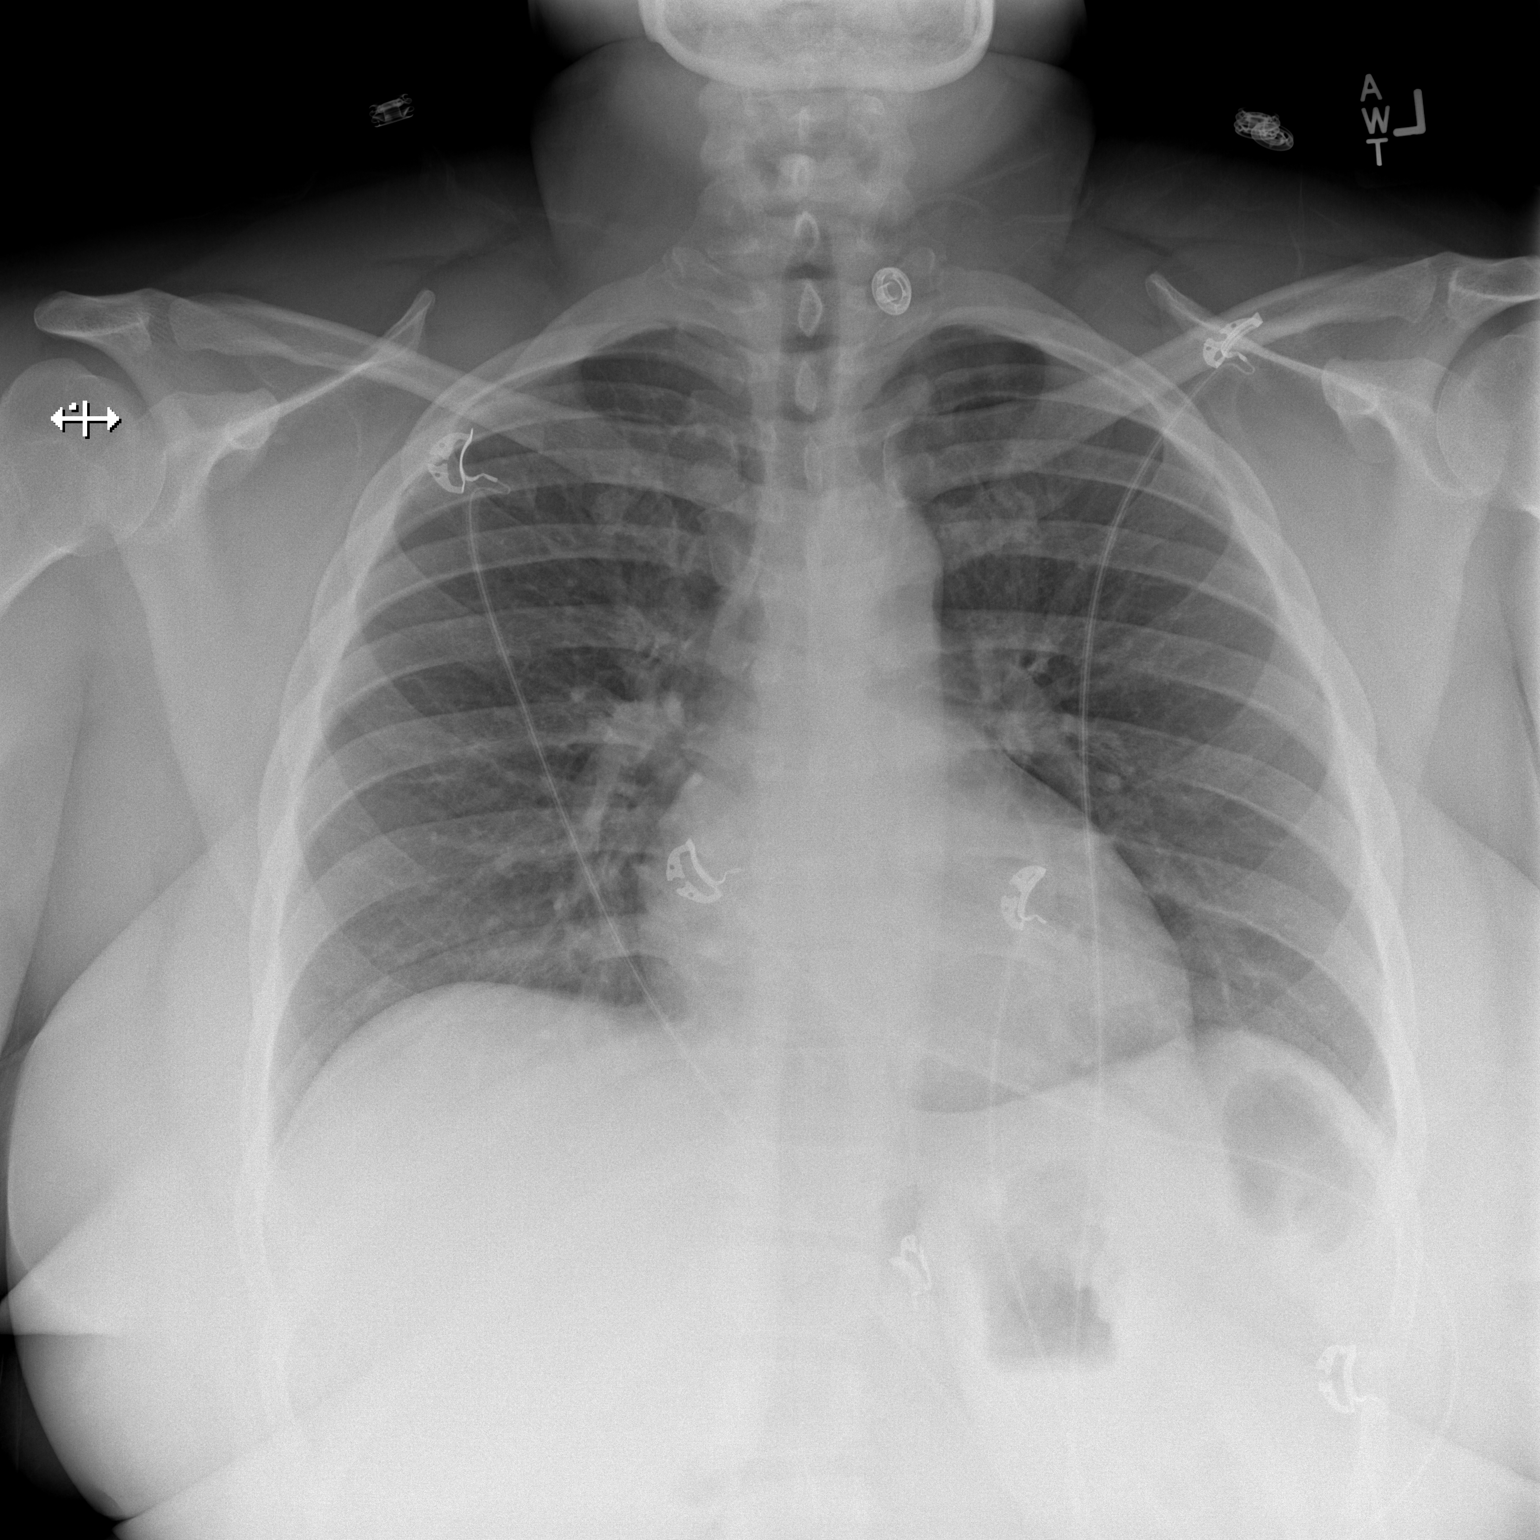

[w chest lat]
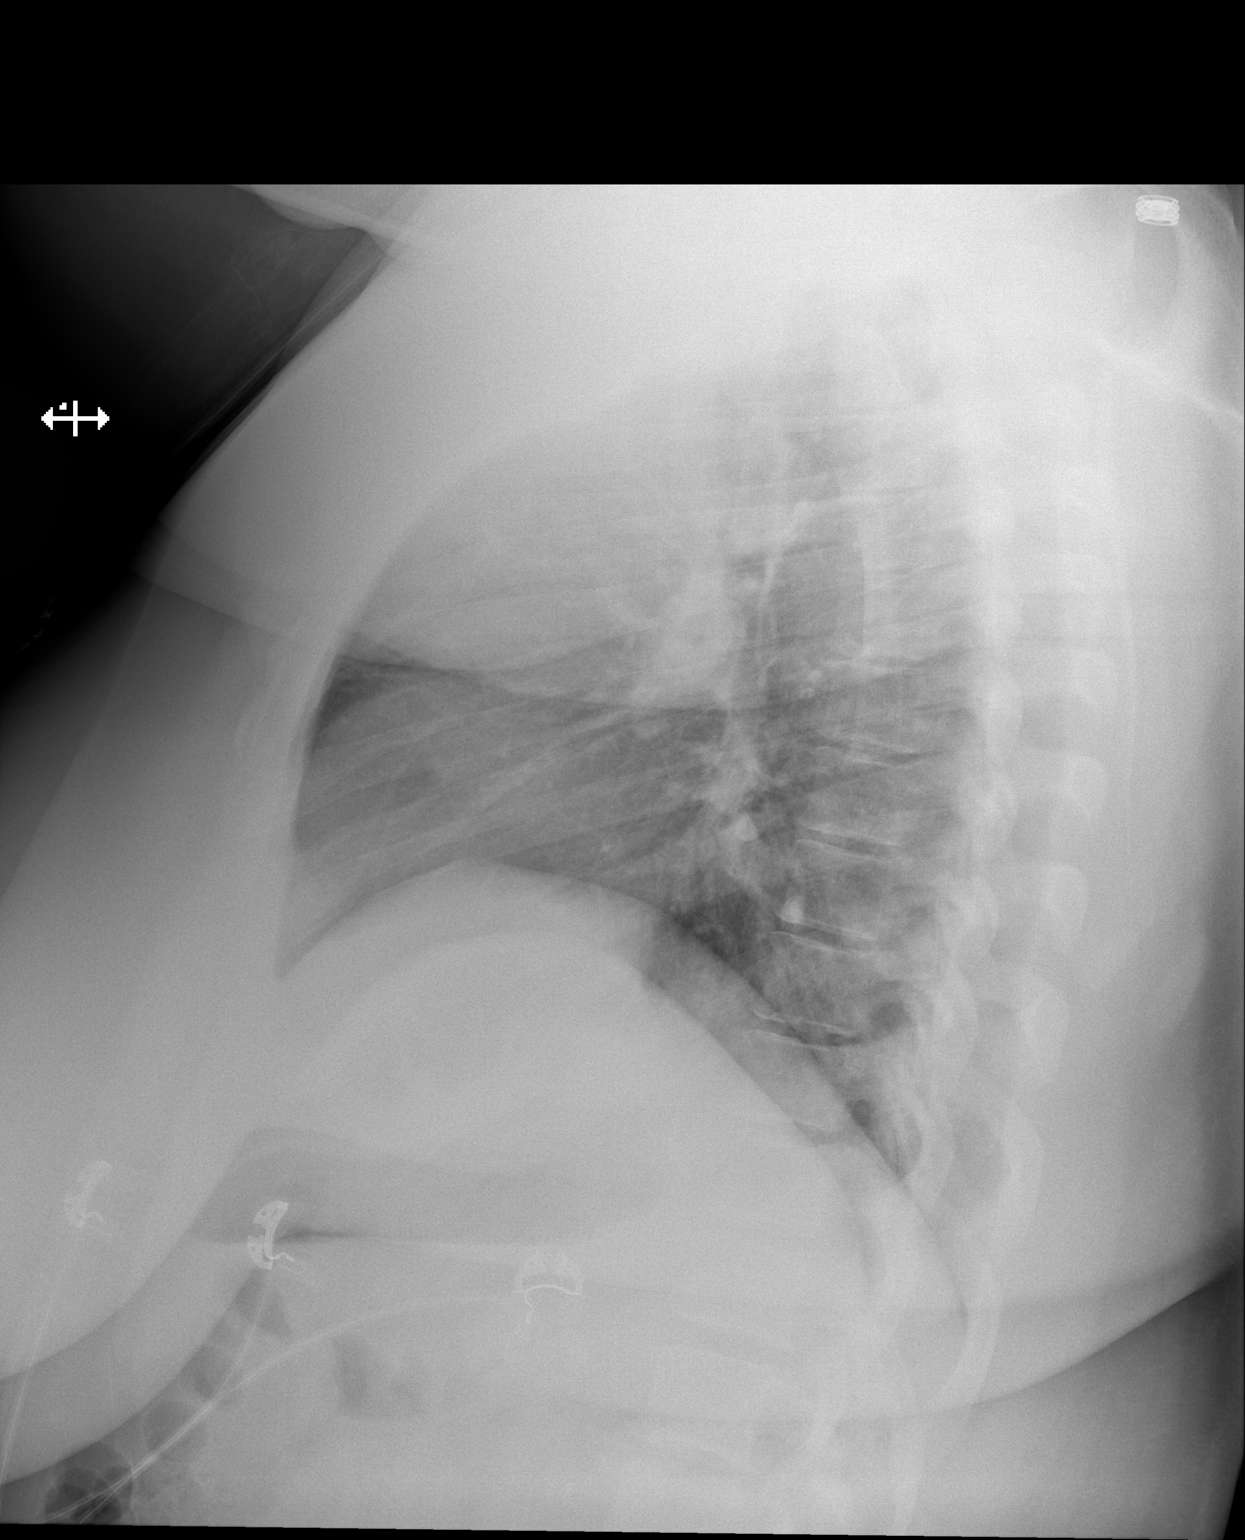

[2 of 2 positions shown; findings below may reference images not displayed]

FINDINGS: The heart size and mediastinal contours are within normal limits.
Both lungs are clear. The visualized skeletal structures are
unremarkable.
IMPRESSION: No active cardiopulmonary disease.

## 2018-01-27 ENCOUNTER — Ambulatory Visit (INDEPENDENT_AMBULATORY_CARE_PROVIDER_SITE_OTHER): Payer: Self-pay | Admitting: Primary Care

## 2018-01-27 ENCOUNTER — Encounter: Payer: Self-pay | Admitting: Primary Care

## 2018-01-27 ENCOUNTER — Other Ambulatory Visit (INDEPENDENT_AMBULATORY_CARE_PROVIDER_SITE_OTHER): Payer: Self-pay

## 2018-01-27 VITALS — BP 150/100 | HR 85 | Temp 97.8°F | Ht 67.0 in | Wt 260.2 lb

## 2018-01-27 DIAGNOSIS — R0602 Shortness of breath: Secondary | ICD-10-CM

## 2018-01-27 DIAGNOSIS — R0600 Dyspnea, unspecified: Secondary | ICD-10-CM | POA: Insufficient documentation

## 2018-01-27 DIAGNOSIS — J45902 Unspecified asthma with status asthmaticus: Secondary | ICD-10-CM

## 2018-01-27 DIAGNOSIS — J9600 Acute respiratory failure, unspecified whether with hypoxia or hypercapnia: Secondary | ICD-10-CM

## 2018-01-27 DIAGNOSIS — I10 Essential (primary) hypertension: Secondary | ICD-10-CM

## 2018-01-27 LAB — CBC WITH DIFFERENTIAL/PLATELET
BASOS ABS: 0.4 10*3/uL — AB (ref 0.0–0.1)
Basophils Relative: 2.7 % (ref 0.0–3.0)
EOS PCT: 0.2 % (ref 0.0–5.0)
Eosinophils Absolute: 0 10*3/uL (ref 0.0–0.7)
HEMATOCRIT: 34.2 % — AB (ref 36.0–46.0)
Hemoglobin: 10.4 g/dL — ABNORMAL LOW (ref 12.0–15.0)
LYMPHS PCT: 25.8 % (ref 12.0–46.0)
Lymphs Abs: 3.9 10*3/uL (ref 0.7–4.0)
MCHC: 30.3 g/dL (ref 30.0–36.0)
MONOS PCT: 7 % (ref 3.0–12.0)
Monocytes Absolute: 1.1 10*3/uL — ABNORMAL HIGH (ref 0.1–1.0)
NEUTROS ABS: 9.7 10*3/uL — AB (ref 1.4–7.7)
Neutrophils Relative %: 64.3 % (ref 43.0–77.0)
Platelets: 450 10*3/uL — ABNORMAL HIGH (ref 150.0–400.0)
RBC: 5.12 Mil/uL — AB (ref 3.87–5.11)
RDW: 20.6 % — ABNORMAL HIGH (ref 11.5–15.5)
WBC: 15.1 10*3/uL — ABNORMAL HIGH (ref 4.0–10.5)

## 2018-01-27 LAB — BASIC METABOLIC PANEL
BUN: 9 mg/dL (ref 6–23)
CALCIUM: 9.1 mg/dL (ref 8.4–10.5)
CHLORIDE: 107 meq/L (ref 96–112)
CO2: 21 mEq/L (ref 19–32)
CREATININE: 0.7 mg/dL (ref 0.40–1.20)
GFR: 119.11 mL/min (ref >60.00)
Glucose, Bld: 120 mg/dL — ABNORMAL HIGH (ref 70–99)
Potassium: 3.2 mEq/L — ABNORMAL LOW (ref 3.5–5.1)
Sodium: 139 mEq/L (ref 135–145)

## 2018-01-27 LAB — BRAIN NATRIURETIC PEPTIDE: Pro B Natriuretic peptide (BNP): 59 pg/mL (ref 0.0–100.0)

## 2018-01-27 MED ORDER — FLUTICASONE FUROATE-VILANTEROL 200-25 MCG/INH IN AEPB: 1.0000 | INHALATION_SPRAY | Freq: Every day | RESPIRATORY_TRACT | 11 refills | Status: DC

## 2018-01-27 NOTE — Assessment & Plan Note (Addendum)
Multifactorial; obesity, asthma, previous tobacco use and uncontrolled hypertension playing a role   Needs ECHO  PFTs schedule for this Friday  Recheck labs

## 2018-01-27 NOTE — Assessment & Plan Note (Signed)
BP today 150/100 New Norvasc 5mg  daily (may adjust dose in 1-2 weeks) Continue Clonidine 0.2mg  twice daily  Needs to follow-up with PCP

## 2018-01-27 NOTE — Assessment & Plan Note (Addendum)
Continues Breo, Atrovent and Xopenex TID No audible wheezing Prednisone taper as prescribed

## 2018-01-27 NOTE — Patient Instructions (Addendum)
PFTs on Friday 10/11  Follow-up with Dr. Vaughan Browner on Monday 10/14   Follow up with primary care for BP  Today 150/100 Continue Norvasc 5mg  daily (may adjust dose in 1-2 weeks) Continue Clonidine 0.2mg  twice daily   Labs today  Needs ECHO re: dyspnea
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
## 2018-01-27 NOTE — Assessment & Plan Note (Signed)
Stable; O2 sat 99% RA

## 2018-01-28 LAB — IGE: IgE (Immunoglobulin E), Serum: 109 kU/L (ref <=114)

## 2018-01-28 NOTE — Telephone Encounter (Signed)
lmtcb for update on application.

## 2018-01-29 ENCOUNTER — Other Ambulatory Visit: Payer: Self-pay

## 2018-01-29 ENCOUNTER — Encounter (HOSPITAL_COMMUNITY): Payer: Self-pay

## 2018-01-29 ENCOUNTER — Emergency Department (HOSPITAL_COMMUNITY)
Admission: EM | Admit: 2018-01-29 | Discharge: 2018-01-30 | Disposition: A | Discharge status: Home / Self Care | Payer: Medicaid Other | Attending: Emergency Medicine | Admitting: Emergency Medicine

## 2018-01-29 DIAGNOSIS — R51 Headache: Secondary | ICD-10-CM | POA: Diagnosis not present

## 2018-01-29 DIAGNOSIS — Z79899 Other long term (current) drug therapy: Secondary | ICD-10-CM | POA: Insufficient documentation

## 2018-01-29 DIAGNOSIS — J45909 Unspecified asthma, uncomplicated: Secondary | ICD-10-CM | POA: Diagnosis not present

## 2018-01-29 DIAGNOSIS — R519 Headache, unspecified: Secondary | ICD-10-CM

## 2018-01-29 DIAGNOSIS — R197 Diarrhea, unspecified: Secondary | ICD-10-CM

## 2018-01-29 DIAGNOSIS — Z87891 Personal history of nicotine dependence: Secondary | ICD-10-CM | POA: Diagnosis not present

## 2018-01-29 DIAGNOSIS — I10 Essential (primary) hypertension: Secondary | ICD-10-CM | POA: Insufficient documentation

## 2018-01-29 DIAGNOSIS — R0602 Shortness of breath: Secondary | ICD-10-CM

## 2018-01-29 DIAGNOSIS — R112 Nausea with vomiting, unspecified: Secondary | ICD-10-CM | POA: Insufficient documentation

## 2018-01-29 LAB — URINALYSIS, ROUTINE W REFLEX MICROSCOPIC
Bilirubin Urine: NEGATIVE
Glucose, UA: NEGATIVE mg/dL
Hgb urine dipstick: NEGATIVE
Ketones, ur: NEGATIVE mg/dL
LEUKOCYTES UA: NEGATIVE
NITRITE: NEGATIVE
PROTEIN: NEGATIVE mg/dL
Specific Gravity, Urine: 1.021 (ref 1.005–1.030)
pH: 5 (ref 5.0–8.0)

## 2018-01-29 LAB — CBC
HCT: 34.5 % — ABNORMAL LOW (ref 36.0–46.0)
Hemoglobin: 9.9 g/dL — ABNORMAL LOW (ref 12.0–15.0)
MCH: 20.2 pg — ABNORMAL LOW (ref 26.0–34.0)
MCHC: 28.7 g/dL — ABNORMAL LOW (ref 30.0–36.0)
MCV: 70.4 fL — ABNORMAL LOW (ref 80.0–100.0)
NRBC: 0 % (ref 0.0–0.2)
PLATELETS: 478 10*3/uL — AB (ref 150–400)
RBC: 4.9 MIL/uL (ref 3.87–5.11)
RDW: 19.7 % — AB (ref 11.5–15.5)
WBC: 18.5 10*3/uL — AB (ref 4.0–10.5)

## 2018-01-29 LAB — COMPREHENSIVE METABOLIC PANEL
ALK PHOS: 55 U/L (ref 38–126)
ALT: 15 U/L (ref 0–44)
ANION GAP: 9 (ref 5–15)
AST: 15 U/L (ref 15–41)
Albumin: 3.4 g/dL — ABNORMAL LOW (ref 3.5–5.0)
BILIRUBIN TOTAL: 0.6 mg/dL (ref 0.3–1.2)
BUN: 13 mg/dL (ref 6–20)
CALCIUM: 9 mg/dL (ref 8.9–10.3)
CO2: 25 mmol/L (ref 22–32)
CREATININE: 0.71 mg/dL (ref 0.44–1.00)
Chloride: 108 mmol/L (ref 98–111)
Glucose, Bld: 122 mg/dL — ABNORMAL HIGH (ref 70–99)
Potassium: 3.3 mmol/L — ABNORMAL LOW (ref 3.5–5.1)
SODIUM: 142 mmol/L (ref 135–145)
TOTAL PROTEIN: 6.5 g/dL (ref 6.5–8.1)

## 2018-01-29 LAB — I-STAT BETA HCG BLOOD, ED (MC, WL, AP ONLY)

## 2018-01-29 LAB — LIPASE, BLOOD: Lipase: 49 U/L (ref 11–51)

## 2018-01-29 LAB — I-STAT CG4 LACTIC ACID, ED
LACTIC ACID, VENOUS: 1.9 mmol/L (ref 0.5–1.9)
Lactic Acid, Venous: 1.29 mmol/L (ref 0.5–1.9)

## 2018-01-29 MED ORDER — AMLODIPINE BESYLATE 10 MG PO TABS: 10.0000 mg | ORAL_TABLET | Freq: Every day | ORAL | 0 refills | Status: DC

## 2018-01-29 MED ORDER — KETOROLAC TROMETHAMINE 30 MG/ML IJ SOLN
30.0000 mg | Freq: Once | INTRAMUSCULAR | Status: AC
  Administered 2018-01-29: 30 mg via INTRAVENOUS
  Filled 2018-01-29: qty 1

## 2018-01-29 MED ORDER — SODIUM CHLORIDE 0.9 % IV BOLUS
1000.0000 mL | Freq: Once | INTRAVENOUS | Status: AC
  Administered 2018-01-29: 1000 mL via INTRAVENOUS

## 2018-01-29 MED ORDER — DIPHENHYDRAMINE HCL 50 MG/ML IJ SOLN
25.0000 mg | Freq: Once | INTRAMUSCULAR | Status: AC
  Administered 2018-01-29: 25 mg via INTRAVENOUS
  Filled 2018-01-29: qty 1

## 2018-01-29 MED ORDER — PROCHLORPERAZINE EDISYLATE 10 MG/2ML IJ SOLN
10.0000 mg | Freq: Once | INTRAMUSCULAR | Status: AC
  Administered 2018-01-29: 10 mg via INTRAVENOUS
  Filled 2018-01-29: qty 2

## 2018-01-29 MED ORDER — ONDANSETRON HCL 4 MG/2ML IJ SOLN
4.0000 mg | Freq: Once | INTRAMUSCULAR | Status: AC
  Administered 2018-01-29: 4 mg via INTRAVENOUS
  Filled 2018-01-29: qty 2
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
## 2018-01-29 NOTE — ED Notes (Signed)
Pt is drinking a ginerale

## 2018-01-29 NOTE — ED Triage Notes (Signed)
Pt reports nausea, vomiting, diarrhea, and headache starting this morning. She reports that she was seen last Monday for the same. She states that she saw her pulmonary doctor yesterday and they stated that her WBCs were elevated. A&Ox4.

## 2018-01-29 NOTE — ED Notes (Signed)
Patient made aware that we need a urine specimen. 

## 2018-01-29 NOTE — Discharge Instructions (Addendum)
You were seen in the ER for nausea, vomiting, diarrhea.  You denied having abdominal pain.  Lab work looked okay.  Your white count was elevated but this can happen from prednisone use.  Symptoms resolved in the ER.  Use Zofran for nausea.  Stay well-hydrated.  Avoid irritating foods such as greasy, fatty meals, fruits, milk products.  Return to the ER for persistent vomiting, inability to tolerate fluids, blood in your vomit or stool, abdominal pain.  Follow-up with gastroenterology if symptoms reoccur and you continue to have recurrent nausea, vomiting, diarrhea.  In regards to headache, this improved with migraine medicine.  Your headache has been ongoing for several weeks.  Your last head CT in august was normal.  Headache may be from slight dehydration or viral syndrome or elevated blood pressure.  Take tylenol 1000 mg every 6 hours for headache.  Follow up with neurology in 1 week if headaches persist. Return for severe, worsening headache, vision changes, difficulty with speech or balance, facial drooping, one sided numbness or weakness. I will increase your blood pressure medication today. Discuss this with your pulmonologist.

## 2018-01-29 NOTE — ED Notes (Addendum)
Patient made aware that we need a stool sample. Pt will let nurse know when she needs to have a BM. Last BM was stated to have been at about 4pm today.

## 2018-01-29 NOTE — ED Notes (Signed)
Patient given ginger ale for PO fluid challenge.  

## 2018-01-31 ENCOUNTER — Telehealth: Payer: Self-pay | Admitting: Primary Care

## 2018-01-31 ENCOUNTER — Telehealth: Payer: Self-pay | Admitting: *Deleted

## 2018-01-31 ENCOUNTER — Other Ambulatory Visit: Payer: Self-pay

## 2018-01-31 ENCOUNTER — Ambulatory Visit (HOSPITAL_COMMUNITY): Payer: Medicaid Other | Attending: Cardiology

## 2018-01-31 ENCOUNTER — Other Ambulatory Visit: Payer: Self-pay | Admitting: Primary Care

## 2018-01-31 ENCOUNTER — Ambulatory Visit (INDEPENDENT_AMBULATORY_CARE_PROVIDER_SITE_OTHER): Payer: Self-pay | Admitting: Pulmonary Disease

## 2018-01-31 ENCOUNTER — Other Ambulatory Visit (INDEPENDENT_AMBULATORY_CARE_PROVIDER_SITE_OTHER): Payer: Self-pay

## 2018-01-31 DIAGNOSIS — R0602 Shortness of breath: Secondary | ICD-10-CM

## 2018-01-31 DIAGNOSIS — R197 Diarrhea, unspecified: Secondary | ICD-10-CM

## 2018-01-31 LAB — PULMONARY FUNCTION TEST
DL/VA % pred: 142 %
DL/VA: 6.97 ml/min/mmHg/L
DLCO COR % PRED: 114 %
DLCO UNC % PRED: 100 %
DLCO UNC: 25.33 ml/min/mmHg
DLCO cor: 29.02 ml/min/mmHg
FEF 25-75 POST: 3.82 L/s
FEF 25-75 PRE: 3.58 L/s
FEF2575-%Change-Post: 6 %
FEF2575-%PRED-PRE: 124 %
FEF2575-%Pred-Post: 132 %
FEV1-%Change-Post: 1 %
FEV1-%PRED-POST: 110 %
FEV1-%Pred-Pre: 108 %
FEV1-POST: 2.87 L
FEV1-Pre: 2.83 L
FEV1FVC-%Change-Post: 2 %
FEV1FVC-%Pred-Pre: 104 %
FEV6-%CHANGE-POST: -1 %
FEV6-%PRED-PRE: 105 %
FEV6-%Pred-Post: 103 %
FEV6-POST: 3.22 L
FEV6-Pre: 3.26 L
FEV6FVC-%PRED-POST: 102 %
FEV6FVC-%Pred-Pre: 102 %
FVC-%CHANGE-POST: -1 %
FVC-%Pred-Post: 101 %
FVC-%Pred-Pre: 102 %
FVC-Post: 3.22 L
FVC-Pre: 3.26 L
POST FEV6/FVC RATIO: 100 %
PRE FEV1/FVC RATIO: 87 %
Post FEV1/FVC ratio: 89 %
Pre FEV6/FVC Ratio: 100 %
RV % pred: 95 %
RV: 1.55 L
TLC % PRED: 93 %
TLC: 4.86 L

## 2018-01-31 LAB — CBC WITH DIFFERENTIAL/PLATELET
BASOS ABS: 0.1 10*3/uL (ref 0.0–0.1)
Basophils Relative: 0.3 % (ref 0.0–3.0)
Eosinophils Absolute: 0 10*3/uL (ref 0.0–0.7)
Eosinophils Relative: 0 % (ref 0.0–5.0)
HCT: 36.5 % (ref 36.0–46.0)
Hemoglobin: 11 g/dL — ABNORMAL LOW (ref 12.0–15.0)
LYMPHS ABS: 1.8 10*3/uL (ref 0.7–4.0)
Lymphocytes Relative: 8.6 % — ABNORMAL LOW (ref 12.0–46.0)
MCHC: 30 g/dL (ref 30.0–36.0)
MCV: 68 fl — AB (ref 78.0–100.0)
MONOS PCT: 0.5 % — AB (ref 3.0–12.0)
Monocytes Absolute: 0.1 10*3/uL (ref 0.1–1.0)
Neutro Abs: 19.1 10*3/uL — ABNORMAL HIGH (ref 1.4–7.7)
Neutrophils Relative %: 90.6 % — ABNORMAL HIGH (ref 43.0–77.0)
Platelets: 484 10*3/uL — ABNORMAL HIGH (ref 150.0–400.0)
RBC: 5.36 Mil/uL — AB (ref 3.87–5.11)
RDW: 21 % — ABNORMAL HIGH (ref 11.5–15.5)
WBC: 21.1 10*3/uL (ref 4.0–10.5)

## 2018-01-31 LAB — BASIC METABOLIC PANEL
BUN: 12 mg/dL (ref 6–23)
CHLORIDE: 103 meq/L (ref 96–112)
CO2: 25 meq/L (ref 19–32)
CREATININE: 0.73 mg/dL (ref 0.40–1.20)
Calcium: 9.5 mg/dL (ref 8.4–10.5)
GFR: 113.47 mL/min (ref >60.00)
Glucose, Bld: 185 mg/dL — ABNORMAL HIGH (ref 70–99)
POTASSIUM: 3.8 meq/L (ref 3.5–5.1)
Sodium: 139 mEq/L (ref 135–145)

## 2018-01-31 NOTE — Telephone Encounter (Signed)
Critical lab results, WBC 21. Sending pathology smear. Called patient with results, she is still on prednisone.  Continues to have N/V/D. No fever or abd pain. Seen in ED on 10/9, WCB 18.5. UA neg. HCG neg. Lactic acid normal x2. Encourage fluids, BRAT diet, will order stool culture. Should follow up with GI if symptoms persist. Echo today was normal. Has follow up with pulmonary on Monday.   Routing to Dr. Vaughan Browner as Juluis Rainier

## 2018-01-31 NOTE — Telephone Encounter (Signed)
Can you please have lab send for path smear if possible????

## 2018-01-31 NOTE — Progress Notes (Signed)
PFT completed today. 01/31/18

## 2018-01-31 NOTE — Telephone Encounter (Signed)
Called Lab and spoke with [facility] asking if they could send the lab off for path.  Hope stated they would take care of sending it off.  Nothing further needed.

## 2018-01-31 NOTE — Telephone Encounter (Signed)
Lab called critical value on WBC 21.0.  Beth please advise  Will route to beth

## 2018-02-03 ENCOUNTER — Ambulatory Visit (INDEPENDENT_AMBULATORY_CARE_PROVIDER_SITE_OTHER): Payer: Self-pay | Admitting: Pulmonary Disease

## 2018-02-03 ENCOUNTER — Encounter: Payer: Self-pay | Admitting: Pulmonary Disease

## 2018-02-03 VITALS — BP 140/90 | HR 90 | Ht 67.0 in | Wt 256.0 lb

## 2018-02-03 DIAGNOSIS — R0602 Shortness of breath: Secondary | ICD-10-CM

## 2018-02-03 DIAGNOSIS — J4550 Severe persistent asthma, uncomplicated: Secondary | ICD-10-CM | POA: Insufficient documentation

## 2018-02-03 LAB — PATHOLOGIST SMEAR REVIEW

## 2018-02-03 NOTE — Patient Instructions (Addendum)
Continue the prednisone taper as directed We will check your stool studies when available Follow-up with primary care for checking iron levels We will follow-up in 4 weeks with a CBC with differential.

## 2018-02-03 NOTE — Telephone Encounter (Signed)
Refaxed to [phone] at 319pm. Will call and check to see if [employer] received.

## 2018-02-03 NOTE — Addendum Note (Signed)
Addended by: Isaiah Serge D on: 02/03/2018 03:33 PM   Modules accepted: Orders

## 2018-02-03 NOTE — Telephone Encounter (Addendum)
Pt is in office for OV with Dr. Vaughan Browner. Pt stated that she brought patient assistant forms for Breo to her 01/27/18 office visit with [facility].  Lattie Haw do you recall receiving patient assistant forms?

## 2018-02-03 NOTE — Progress Notes (Signed)
Jenna Stephens    [id number]    06-08-1975  Primary Care Physician:Carroll, Christian Mate, PA  Referring Physician: No referring provider defined for this encounter.  Chief complaint: Follow-up for asthma.  HPI: 42 year old with history of hypertension.  Admitted to [facility] hospital on 12/29/2017 with exacerbation and status asthmaticus in the setting of rhinovirus infection.  She required intubation, paralysis.  Discharged on 9/16  Here for follow-up after hospitalization. Her sister has been diagnosed with strep throat this week and she is concerned Reports swelling of the upper lip.  No cough, fevers, sputum production, stridor Breathing is stable.  She is only on Atrovent and Xopenex nebulizer.  She has not been discharged on a controller inhaler  Pets: Has 2 [place] pigs, no cats, dogs, birds or farm animals Occupation: Works in Barista support for [employer] Exposures: Reports that bathroom has a leak however no known mold, no hot tub, Jacuzzi Smoking history: Smokes 2 cigarettes a day, smokes marijuana, no vaping Travel history: No significant travel history Relevant family history: Mother has asthma  Interim history: States that breathing is up-and-down.  Continues on Breo and prednisone taper  Elevated in the ED recently for nausea, hypertension, headache.  No clear etiology found  Outpatient Encounter Medications as of 02/03/2018  Medication Sig  . amLODipine (NORVASC) 10 MG tablet Take 1 tablet (10 mg total) by mouth daily.  . cloNIDine (CATAPRES) 0.3 MG tablet Take 0.3 mg by mouth 2 (two) times daily.  . fluticasone furoate-vilanterol (BREO ELLIPTA) 200-25 MCG/INH AEPB Inhale 1 puff into the lungs daily.  Marland Kitchen ipratropium (ATROVENT) 0.02 % nebulizer solution Take 2.5 mLs (0.5 mg total) by nebulization 3 (three) times daily.  Marland Kitchen levalbuterol (XOPENEX) 1.25 MG/0.5ML nebulizer solution Take 1.25 mg by nebulization 3 (three) times daily.  . montelukast (SINGULAIR)  10 MG tablet Take 1 tablet (10 mg total) by mouth at bedtime.  . Multiple Vitamin (MULTIVITAMIN WITH MINERALS) TABS tablet Take 1 tablet by mouth daily.  . pantoprazole (PROTONIX) 40 MG tablet Take 1 tablet (40 mg total) by mouth daily.  . predniSONE (DELTASONE) 10 MG tablet Take 40 mg x3 days, 30 mg x3 days, 20 mg x3 days, 10 mg x3 days then STOP.  Marland Kitchen rOPINIRole (REQUIP) 2 MG tablet Take 2 mg by mouth at bedtime.  . sertraline (ZOLOFT) 50 MG tablet Take 50 mg by mouth daily.  . traMADol (ULTRAM) 50 MG tablet Take 50 mg by mouth every 6 (six) hours as needed (states she was instructed to take every 4 hours as needed).  . zolpidem (AMBIEN) 10 MG tablet Take 10 mg by mouth at bedtime as needed for sleep.  . [DISCONTINUED] cloNIDine (CATAPRES) 0.2 MG tablet Take 1 tablet (0.2 mg total) by mouth 2 (two) times daily.  . [DISCONTINUED] fluticasone furoate-vilanterol (BREO ELLIPTA) 200-25 MCG/INH AEPB Inhale 1 puff into the lungs daily.   No facility-administered encounter medications on file as of 02/03/2018.    Physical Exam: Blood pressure 140/90, pulse 90, height 5\' 7"  (1.702 m), weight 256 lb (116.1 kg), SpO2 98 %, unknown if currently breastfeeding. Gen:      No acute distress HEENT:  EOMI, sclera anicteric Neck:     No masses; no thyromegaly Lungs:    Clear to auscultation bilaterally; normal respiratory effort CV:         Regular rate and rhythm; no murmurs Abd:      + bowel sounds; soft, non-tender; no  palpable masses, no distension Ext:    No edema; adequate peripheral perfusion Skin:      Warm and dry; no rash Neuro: alert and oriented x 3 Psych: normal mood and affect  Data Reviewed: Imaging: Chest x-ray 9/8-no acute abnormality CTA 9/8- no pulmonary embolism in the main pulmonary arteries, mild nodular patchy opacities in the left apex.  I have reviewed the images personally.  PFTs: 01/30/2018 FVC 3.22 [101%), FEV1 2.87 [110%), F/F 89, TLC 93%, DLCO 100% Normal test  FENO  40/19/19-5  Labs: CBC 12/29/2017-WBC 19.7, eos 9%, absolute eosinophil count 1773 Blood allergy profile 12/30/2017-IgE 214, RAST panel shows sensitivity to dust mite, cat, dog, tree, grass pollen, mouse urine  Assessment:  Moderate persistent asthma, allergies Follow-up after hospitalization.  She is making a slow recovery PFTs do not show overt obstruction however she did have elevated peripheral eos and IgE level FENO is low but she is still on steroids.  Prednisone taper is due to finish this week  Continue Breo, Singulair  Nausea, intermittent diarrhea GERD Continue Protonix Stool cultures, studies are pending for parasites   Abnormal labs CBC reviewed with elevated WBC count, mild reduction in hemoglobin With elevated WBC count may be secondary to steroids.  She will need a follow-up CBC in a few weeks Suggest that she follow-up with primary care for evaluation of iron deficiency anemia.  Plan/Recommendations: - Breo, continue Atrovent, Xopenex nebs as needed - Follow-up in 4 weeks with CBC with differential.  Marshell Garfinkel MD [facility] 02/03/2018, 2:51 PM  CC: No ref. provider found

## 2018-02-06 ENCOUNTER — Other Ambulatory Visit: Payer: Self-pay

## 2018-02-06 DIAGNOSIS — R197 Diarrhea, unspecified: Secondary | ICD-10-CM

## 2018-02-09 LAB — CLOSTRIDIUM DIFFICILE EIA: C difficile Toxins A+B, EIA: NEGATIVE

## 2018-02-10 LAB — STOOL CULTURE
MICRO NUMBER: 91249615
MICRO NUMBER: 91249616
MICRO NUMBER:: 91249617
SHIGA RESULT:: NOT DETECTED
SPECIMEN QUALITY: ADEQUATE
SPECIMEN QUALITY:: ADEQUATE
SPECIMEN QUALITY:: ADEQUATE

## 2018-02-10 LAB — OVA AND PARASITE EXAMINATION
CONCENTRATE RESULT: NONE SEEN
SPECIMEN QUALITY:: ADEQUATE
TRICHROME RESULT: NONE SEEN
VKL: 91249772

## 2018-02-11 NOTE — Telephone Encounter (Signed)
Was approved from [place] for pt assistance.

## 2018-02-27 ENCOUNTER — Other Ambulatory Visit (INDEPENDENT_AMBULATORY_CARE_PROVIDER_SITE_OTHER): Payer: Self-pay

## 2018-02-27 DIAGNOSIS — R0602 Shortness of breath: Secondary | ICD-10-CM

## 2018-02-27 LAB — CBC WITH DIFFERENTIAL/PLATELET
BASOS ABS: 0.1 10*3/uL (ref 0.0–0.1)
Basophils Relative: 0.4 % (ref 0.0–3.0)
EOS ABS: 0.2 10*3/uL (ref 0.0–0.7)
Eosinophils Relative: 1.6 % (ref 0.0–5.0)
HEMATOCRIT: 33.2 % — AB (ref 36.0–46.0)
HEMOGLOBIN: 10.1 g/dL — AB (ref 12.0–15.0)
LYMPHS PCT: 17.1 % (ref 12.0–46.0)
Lymphs Abs: 2.6 10*3/uL (ref 0.7–4.0)
MCHC: 30.5 g/dL (ref 30.0–36.0)
MCV: 67.9 fl — ABNORMAL LOW (ref 78.0–100.0)
Monocytes Absolute: 0.7 10*3/uL (ref 0.1–1.0)
Monocytes Relative: 4.7 % (ref 3.0–12.0)
Neutro Abs: 11.5 10*3/uL — ABNORMAL HIGH (ref 1.4–7.7)
Neutrophils Relative %: 76.2 % (ref 43.0–77.0)
Platelets: 354 10*3/uL (ref 150.0–400.0)
RBC: 4.9 Mil/uL (ref 3.87–5.11)
RDW: 20.2 % — ABNORMAL HIGH (ref 11.5–15.5)
WBC: 15 10*3/uL — AB (ref 4.0–10.5)

## 2018-03-03 ENCOUNTER — Other Ambulatory Visit (INDEPENDENT_AMBULATORY_CARE_PROVIDER_SITE_OTHER): Payer: Self-pay

## 2018-03-03 ENCOUNTER — Ambulatory Visit (INDEPENDENT_AMBULATORY_CARE_PROVIDER_SITE_OTHER): Payer: Self-pay | Admitting: Pulmonary Disease

## 2018-03-03 ENCOUNTER — Encounter: Payer: Self-pay | Admitting: Pulmonary Disease

## 2018-03-03 VITALS — BP 138/80 | HR 101 | Ht 68.0 in | Wt 262.8 lb

## 2018-03-03 DIAGNOSIS — R0602 Shortness of breath: Secondary | ICD-10-CM

## 2018-03-03 DIAGNOSIS — J4550 Severe persistent asthma, uncomplicated: Secondary | ICD-10-CM

## 2018-03-03 LAB — CBC WITH DIFFERENTIAL/PLATELET
BASOS ABS: 0.3 10*3/uL — AB (ref 0.0–0.1)
BASOS PCT: 1.7 % (ref 0.0–3.0)
EOS ABS: 0.3 10*3/uL (ref 0.0–0.7)
Eosinophils Relative: 1.7 % (ref 0.0–5.0)
HEMATOCRIT: 35.5 % — AB (ref 36.0–46.0)
LYMPHS PCT: 19.9 % (ref 12.0–46.0)
Lymphs Abs: 3.2 10*3/uL (ref 0.7–4.0)
MCHC: 30 g/dL (ref 30.0–36.0)
MCV: 67.5 fl — ABNORMAL LOW (ref 78.0–100.0)
MONO ABS: 0.8 10*3/uL (ref 0.1–1.0)
Monocytes Relative: 5 % (ref 3.0–12.0)
Neutro Abs: 11.4 10*3/uL — ABNORMAL HIGH (ref 1.4–7.7)
Neutrophils Relative %: 71.7 % (ref 43.0–77.0)
Platelets: 417 10*3/uL — ABNORMAL HIGH (ref 150.0–400.0)
RBC: 5.27 Mil/uL — ABNORMAL HIGH (ref 3.87–5.11)
RDW: 19.8 % — ABNORMAL HIGH (ref 11.5–15.5)
WBC: 15.9 10*3/uL — AB (ref 4.0–10.5)

## 2018-03-03 LAB — IBC PANEL
IRON: 10 ug/dL — AB (ref 42–145)
SATURATION RATIOS: 1.9 % — AB (ref 20.0–50.0)
TRANSFERRIN: 367 mg/dL — AB (ref 212.0–360.0)

## 2018-03-03 LAB — FERRITIN: FERRITIN: 4.3 ng/mL — AB (ref 10.0–291.0)

## 2018-03-03 LAB — IRON: Iron: 10 ug/dL — ABNORMAL LOW (ref 42–145)

## 2018-03-03 LAB — NITRIC OXIDE: NITRIC OXIDE: 11

## 2018-03-03 MED ORDER — UMECLIDINIUM BROMIDE 62.5 MCG/INH IN AEPB: 1.0000 | INHALATION_SPRAY | Freq: Every day | RESPIRATORY_TRACT | 6 refills | Status: DC

## 2018-03-03 NOTE — Progress Notes (Addendum)
Jenna Stephens    [id number]    1976/03/08  Primary Care Physician:Carroll, Christian Mate, PA  Referring Physician: Milford Cage, [place],  [id number]  Chief complaint: Follow-up for asthma.  HPI: 42 year old with history of hypertension.  Admitted to [facility] hospital on 12/29/2017 with exacerbation and status asthmaticus in the setting of rhinovirus infection.  She required intubation, paralysis.  Discharged on 9/16  Follow-up after hospitalization in the pulmonary clinic.  Started on Breo, Singulair and nebs Seen in the ED on 10/9 with nausea, vomiting and intermittent diarrhea.   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 States that breathing is slightly better. She has returned to work for [employer].  She is working part-time 4 hours a day and says she is exhausted by the end of the day Also has intermittent nausea which responds to Zofran as needed.  Outpatient Encounter Medications as of 03/03/2018  Medication Sig  . amLODipine (NORVASC) 10 MG tablet Take 1 tablet (10 mg total) by mouth daily.  . cloNIDine (CATAPRES) 0.3 MG tablet Take 0.3 mg by mouth 2 (two) times daily.  . fluticasone furoate-vilanterol (BREO ELLIPTA) 200-25 MCG/INH AEPB Inhale 1 puff into the lungs daily.  Marland Kitchen ipratropium (ATROVENT) 0.02 % nebulizer solution Take 2.5 mLs (0.5 mg total) by nebulization 3 (three) times daily.  Marland Kitchen levalbuterol (XOPENEX) 1.25 MG/0.5ML nebulizer solution Take 1.25 mg by nebulization 3 (three) times daily.  .  montelukast (SINGULAIR) 10 MG tablet Take 1 tablet (10 mg total) by mouth at bedtime.  . Multiple Vitamin (MULTIVITAMIN WITH MINERALS) TABS tablet Take 1 tablet by mouth daily.  . pantoprazole (PROTONIX) 40 MG tablet Take 1 tablet (40 mg total) by mouth daily.  Marland Kitchen PARoxetine (PAXIL) 10 MG tablet Take 10 mg by mouth daily.  . traMADol (ULTRAM) 50 MG tablet Take 50 mg by mouth every 6 (six) hours as needed (states she was instructed to take every 4 hours as needed).  . zolpidem (AMBIEN) 10 MG tablet Take 10 mg by mouth at bedtime as needed for sleep.  Marland Kitchen rOPINIRole (REQUIP) 2 MG tablet Take 2 mg by mouth at bedtime.  . [DISCONTINUED] predniSONE (DELTASONE) 10 MG tablet Take 40 mg x3 days, 30 mg x3 days, 20 mg x3 days, 10 mg x3 days then STOP.  . [DISCONTINUED] sertraline (ZOLOFT) 50 MG tablet Take 50 mg by mouth daily.   No facility-administered encounter medications on file as of 03/03/2018.    Physical Exam: Blood pressure 140/90, pulse 90, height 5\' 7"  (1.702 m), weight 256 lb (116.1 kg), SpO2 98 %, unknown if currently breastfeeding. Gen:      No acute distress HEENT:  EOMI, sclera anicteric Neck:     No masses; no thyromegaly Lungs:    Clear to auscultation bilaterally; normal respiratory effort CV:         Regular rate and rhythm; no murmurs Abd:      + bowel sounds; soft, non-tender; no palpable masses, no distension Ext:    No edema; adequate peripheral perfusion Skin:  Warm and dry; no rash Neuro: alert and oriented x 3 Psych: normal mood and affect  Data Reviewed: Imaging: Chest x-ray 9/8-no acute abnormality CTA 9/8- no pulmonary embolism in the main pulmonary arteries, mild nodular patchy opacities in the left apex.  I have reviewed the images personally.  PFTs: 01/30/2018 FVC 3.22 [101%), FEV1 2.87 [110%), F/F 89, TLC 93%, DLCO 100% Normal test  FENO 01/09/18-5 FENO 03/01/2018-11.  Labs: CBC 12/29/2017-WBC 19.7, eos 9%, absolute eosinophil count 1773 Blood allergy  profile 12/30/2017-IgE 214, RAST panel shows sensitivity to dust mite, cat, dog, tree, grass pollen, mouse urine  Assessment:  Moderate persistent asthma, allergies Continues to be symptomatic We will continue Breo, Singulair Add incruse inhaler.  Repeat IgE and eosinophils.  If they continue to be elevated then we will consider biologics.  GERD Continue Protonix  Abnormal labs CBC reviewed with elevated WBC count, mild reduction in hemoglobin Follow repeat CBC and iron studies.  If low she will need evaluation and iron repletion.  Plan/Recommendations: - Breo, add incruse - Repeat CBC, IgE - Iron studies  Marshell Garfinkel MD [facility] 03/03/2018, 4:14 PM  CC: Milford Cage, PA

## 2018-03-03 NOTE — Patient Instructions (Addendum)
Recheck another FENO, CBC differential and IgE level today  We may need to consider injections for better control of asthma.  Will make the decision based on review of the above tests  We will start you on an inhaler called incruse.  Use this instead of the Atrovent nebulizer.  Continue the Breo, Singulair  Please work on weight loss as this will improve your symptoms of dyspnea.

## 2018-03-04 ENCOUNTER — Telehealth: Payer: Self-pay | Admitting: Pulmonary Disease

## 2018-03-04 LAB — IRON AND TIBC
Iron Saturation: 4 % — CL (ref 15–55)
Iron: 17 ug/dL — ABNORMAL LOW (ref 27–159)
Total Iron Binding Capacity: 406 ug/dL (ref 250–450)
UIBC: 389 ug/dL (ref 131–425)

## 2018-03-04 LAB — IGE: IgE (Immunoglobulin E), Serum: 96 kU/L (ref <=114)

## 2018-03-04 MED ORDER — UMECLIDINIUM BROMIDE 62.5 MCG/INH IN AEPB: 1.0000 | INHALATION_SPRAY | Freq: Every day | RESPIRATORY_TRACT | 6 refills | Status: AC

## 2018-03-04 NOTE — Telephone Encounter (Signed)
Called and spoke to patient, patient states she does not have insurance and she went to pick up her Incruse inhaler and it was $800. I informed patient [facility] and we usually are able to get inhalers much cheaper through them. I informed this patient if it is too high call and let us know. Informed patient in the mean time I would touch bases with PM to see if we could one of our samples in office to hold her over until we can find an affordable inhaler for her.   PM please advise of a sample inhaler we can give patient until she is able to get to pharmacy.

## 2018-03-05 ENCOUNTER — Telehealth: Payer: Self-pay | Admitting: Pulmonary Disease

## 2018-03-06 ENCOUNTER — Telehealth: Payer: Self-pay

## 2018-03-06 DIAGNOSIS — R110 Nausea: Secondary | ICD-10-CM

## 2018-03-06 NOTE — Telephone Encounter (Signed)
Ok to use sample of incruse or spiriva- whichever is available in office.

## 2018-03-06 NOTE — Telephone Encounter (Signed)
There is another encounter open where pt had called about inhaler change. Pt was attempted to be called but she was unable to be reached and a message was left for pt to return call.  Once pt returns call, we will discuss the inhaler and also answer all other questions pt has.

## 2018-03-06 NOTE — Telephone Encounter (Signed)
Attempted to call pt but unable to reach her. Left message for pt to return call. 

## 2018-03-06 NOTE — Telephone Encounter (Signed)
Patient called back requesting results of lab work, relayed results per PM. Patient okay with GI referral. Order placed. Patient voiced understanding. Nothing further is needed at this time.

## 2018-03-11 ENCOUNTER — Encounter: Payer: Self-pay | Admitting: Gastroenterology

## 2018-03-11 ENCOUNTER — Telehealth: Payer: Self-pay | Admitting: Pulmonary Disease

## 2018-03-11 NOTE — Telephone Encounter (Signed)
lmtcb x1 for pt. 

## 2018-03-11 NOTE — Telephone Encounter (Signed)
Called patient unable to reach left message to give us a call back.

## 2018-03-11 NOTE — Telephone Encounter (Signed)
Results were given to pt in another open phone note encounter. Closing this encounter.

## 2018-03-12 NOTE — Telephone Encounter (Signed)
Attempted to call pt but unable to reach her. Left message for pt to return call. 

## 2018-03-12 NOTE — Telephone Encounter (Signed)
Called and spoke with patient, she stated that she is needing a sample of the incruse inhaler. We do not have any samples of that at this time so I have placed a sample of Spiriva up front per Dr. Vaughan Browner. I have also placed patient assistance forms up front for the patient to complete. Nothing further needed.

## 2018-03-12 NOTE — Telephone Encounter (Signed)
Pt is calling back [phone]

## 2018-03-13 NOTE — Telephone Encounter (Signed)
lmtcb x2 for pt. 

## 2018-03-18 ENCOUNTER — Telehealth: Payer: Self-pay | Admitting: Pulmonary Disease

## 2018-03-18 NOTE — Telephone Encounter (Signed)
Called and spoke with pt letting her know that we did receive the request for the PA to be initiated for her incruse. Stated to pt that we would call and see if we could get the PA taken care for her. Stated to pt that once we had more information that we would call her with an update. Pt expressed understanding.  Called [place] [facility] at [phone] to initiate the PA for incruse but when I called to try to initiate the PA, [place] [facility] was closed. We will have to follow up on this tomorrow, 03/19/18

## 2018-03-19 MED ORDER — UMECLIDINIUM BROMIDE 62.5 MCG/INH IN AEPB: 1.0000 | INHALATION_SPRAY | Freq: Every day | RESPIRATORY_TRACT | 11 refills | Status: DC

## 2018-03-19 NOTE — Telephone Encounter (Signed)
Called [place] [facility] to initiate PA for pt's Incruse Ellipta. Spoke with Ita to initiate PA. Pt has tried/failed Breo, Spiriva, Atrovent nebs.  Per [place], PA was approved and is good through 03/14/2019. CH#[id number] and Interaction ID # for the call was: [id number]  Called pt's pharmacy and spoke with [facility] letting her know that the PA was approved. Even after the PA approval, the Incruse is still $362  Called pt but unable to reach her. Left message for pt to return call.

## 2018-03-19 NOTE — Telephone Encounter (Signed)
Called [place] [facility], was placed on hold for over 12 minutes, will call back.

## 2018-03-24 NOTE — Telephone Encounter (Signed)
Attempted to call pt but unable to reach her. Left message for pt to return call. 

## 2018-03-24 NOTE — Telephone Encounter (Signed)
Please see 03/11/18 phone note. 

## 2018-03-25 NOTE — Telephone Encounter (Signed)
Dr. Vaughan Browner, cost of medication is too much for patient-please advise what we can change her to. Thanks.

## 2018-03-26 NOTE — Telephone Encounter (Signed)
Check if spiriva is covered

## 2018-03-26 NOTE — Telephone Encounter (Signed)
Attempted to call pt but unable to reach her. Left message for pt to return call. 

## 2018-03-28 NOTE — Telephone Encounter (Signed)
Attempted to call pt but unable to reach her. Left message for pt to return call. 

## 2018-03-31 NOTE — Telephone Encounter (Signed)
ATC patient, unable to reach [facility] x3 on preferred phone number listed for patient.   Will route to [facility] to follow up on when patient comes in for her appointment on 12.18/19 with Dr. Vaughan Browner.   Per policy, Closing this message and this is attempt 3.

## 2018-04-08 ENCOUNTER — Ambulatory Visit: Payer: Medicaid Other | Admitting: Gastroenterology

## 2018-04-08 ENCOUNTER — Encounter: Payer: Self-pay | Admitting: Gastroenterology

## 2018-04-08 VITALS — BP 148/98 | HR 94 | Ht 67.0 in | Wt 277.0 lb

## 2018-04-08 DIAGNOSIS — R51 Headache: Secondary | ICD-10-CM

## 2018-04-08 DIAGNOSIS — K219 Gastro-esophageal reflux disease without esophagitis: Secondary | ICD-10-CM

## 2018-04-08 DIAGNOSIS — N920 Excessive and frequent menstruation with regular cycle: Secondary | ICD-10-CM

## 2018-04-08 DIAGNOSIS — D509 Iron deficiency anemia, unspecified: Secondary | ICD-10-CM | POA: Diagnosis not present

## 2018-04-08 DIAGNOSIS — R519 Headache, unspecified: Secondary | ICD-10-CM

## 2018-04-08 MED ORDER — FERROUS SULFATE 325 (65 FE) MG PO TABS: 325.0000 mg | ORAL_TABLET | Freq: Every day | ORAL | 5 refills | Status: DC

## 2018-04-08 MED ORDER — PANTOPRAZOLE SODIUM 40 MG PO TBEC: 40.0000 mg | DELAYED_RELEASE_TABLET | Freq: Every day | ORAL | 3 refills | Status: DC
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
## 2018-04-08 NOTE — Patient Instructions (Signed)
If you are age 40 or older, your body mass index should be between 23-30. Your Body mass index is 43.38 kg/m. If this is out of the aforementioned range listed, please consider follow up with your Primary Care Provider.  If you are age 68 or younger, your body mass index should be between 19-25. Your Body mass index is 43.38 kg/m. If this is out of the aformentioned range listed, please consider follow up with your Primary Care Provider.   We have sent the following medications to your pharmacy for you to pick up at your convenience: Protonix 40mg : Take once a day Ferrous Sulfate: Take 1 to 2 tablets daily  Discontinue Excedrin and avoid NSAIDs.  Take Tylenol as needed for headaches.  Follow up with your Primary Care Provider regarding your headaches.  Thank you for entrusting me with your care and for choosing [facility], Dr. Paulina Cellar

## 2018-04-09 ENCOUNTER — Encounter: Payer: Self-pay | Admitting: Pulmonary Disease

## 2018-04-09 ENCOUNTER — Ambulatory Visit (INDEPENDENT_AMBULATORY_CARE_PROVIDER_SITE_OTHER): Payer: Medicaid Other | Admitting: Pulmonary Disease

## 2018-04-09 VITALS — BP 140/70 | HR 78 | Ht 67.0 in | Wt 277.0 lb

## 2018-04-09 DIAGNOSIS — J4540 Moderate persistent asthma, uncomplicated: Secondary | ICD-10-CM | POA: Diagnosis not present

## 2018-04-09 MED ORDER — MONTELUKAST SODIUM 10 MG PO TABS: 10.0000 mg | ORAL_TABLET | Freq: Every day | ORAL | 2 refills | Status: DC

## 2018-04-09 NOTE — Progress Notes (Signed)
Jenna Stephens    [id number]    1975/05/07  Primary Care Physician:Carroll, Christian Mate, PA  Referring Physician: Milford Cage, [place], [place] [address]  Chief complaint: Follow-up for asthma.  HPI: 42 year old with history of hypertension.  Admitted to [facility] on 12/29/2017 with exacerbation and status asthmaticus in the setting of rhinovirus infection.  She required intubation, paralysis.  Discharged on 9/16  Follow-up after hospitalization in the pulmonary clinic.  Started on Breo, Singulair and nebs Seen in the ED on 10/9 with nausea, vomiting and intermittent diarrhea.   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  Started on Incruse at last visit She ran out of Singulair a few months ago. That she has chronic dyspnea on exertion, cough with chest congestion which is unchanged from baseline  Seen by Dr. Havery Moros, GI for iron deficiency anemia and started on iron supplementation.  Outpatient Encounter Medications as of 04/09/2018  Medication Sig  . buPROPion (ZYBAN) 150 MG 12 hr tablet Take 150 mg by mouth 2 (two) times daily.  . clonazePAM (KLONOPIN) 0.5 MG tablet Take 0.5 mg by mouth 2 (two) times daily as needed for anxiety.  . cloNIDine (CATAPRES) 0.3 MG tablet Take 0.3 mg by mouth 2 (two) times daily.  . ferrous sulfate 325 (65 FE) MG tablet Take 1-2 tablets (325-650 mg total) by mouth daily with breakfast.  . fluticasone furoate-vilanterol (BREO ELLIPTA) 200-25 MCG/INH AEPB Inhale 1 puff into the lungs daily.  . Multiple Vitamin (MULTIVITAMIN WITH MINERALS) TABS tablet  Take 1 tablet by mouth daily.  . pantoprazole (PROTONIX) 40 MG tablet Take 1 tablet (40 mg total) by mouth daily.  Marland Kitchen PARoxetine (PAXIL) 10 MG tablet Take 10 mg by mouth daily.  . traMADol (ULTRAM) 50 MG tablet Take 50 mg by mouth every 6 (six) hours as needed (states she was instructed to take every 4 hours as needed).  Marland Kitchen umeclidinium bromide (INCRUSE ELLIPTA) 62.5 MCG/INH AEPB Inhale 1 puff into the lungs daily.  Marland Kitchen zolpidem (AMBIEN) 10 MG tablet Take 10 mg by mouth at bedtime as needed for sleep.  Marland Kitchen amLODipine (NORVASC) 10 MG tablet Take 1 tablet (10 mg total) by mouth daily.   No facility-administered encounter medications on file as of 04/09/2018.    Physical Exam: Blood pressure 140/70, pulse 78, height 5\' 7"  (1.702 m), weight 277 lb (125.6 kg), SpO2 98 %, unknown if currently breastfeeding. Gen:      No acute distress HEENT:  EOMI, sclera anicteric Neck:     No masses; no thyromegaly Lungs:    Clear to auscultation bilaterally; normal respiratory effort CV:         Regular rate and rhythm; no murmurs Abd:      + bowel sounds; soft, non-tender; no palpable masses, no distension Ext:    No edema; adequate peripheral perfusion Skin:      Warm and dry; no rash Neuro: alert and oriented x 3 Psych: normal mood and affect  Data Reviewed: Imaging: Chest x-ray 9/8-no acute abnormality CTA 9/8- no pulmonary embolism in the main pulmonary arteries, mild nodular patchy opacities  in the left apex.  I have reviewed the images personally.  PFTs: 01/30/2018 FVC 3.22 [101%), FEV1 2.87 [110%), F/F 89, TLC 93%, DLCO 100% Normal test  FENO 01/09/18-5 FENO 03/01/2018-11.  Labs: CBC 12/29/2017-WBC 19.7, eos 9%, absolute eosinophil count 1773 Blood allergy profile 12/30/2017-IgE 214, RAST panel shows sensitivity to dust mite, cat, dog, tree, grass pollen, mouse urine  Assessment:  Moderate persistent asthma, allergies We will continue Breo, incruse Restart Singulair as she was out of  medication  Monitor symptoms.  If she does not improve then we may consider initiation of Biologics.  Obesity Discussed that weight loss will lead to significant improvements in her breathing Advised diet and exercise with goal target of reducing 20 to 30 pounds.  GERD Continue Protonix.   On iron supplementation for iron deficiency anemia.    Plan/Recommendations: - Continue Breo, Incruse - Start Singulair - Continue Protonix, iron supplementation  Marshell Garfinkel MD [facility] 04/09/2018, 4:25 PM  CC: Milford Cage, PA

## 2018-04-09 NOTE — Patient Instructions (Addendum)
Continue the Breo, Incruse We will start you on Singulair  Follow back with you in 3 months.

## 2018-04-22 LAB — AMB REFERRAL TO OB-GYN: Urine Culture, OB: NEGATIVE

## 2018-04-23 NOTE — L&D Delivery Note (Signed)
Delivery Note Jenna Stephens is a 43 y.o. [id number] at [redacted]w[redacted]d admitted for IOL for severe PEC.  Labor course:  ROM: 10h 17m with clear fluid  At 0529 a viable girl was delivered via spontaneous vaginal delivery(Presentation: vertex; LOA). Infant immediately taken to warmer with placenta still attached. The placenta separated spontaneously and delivered intact. Code apgar called and NICU at bedside. Cord double clamped and cut by RN. APGAR: assigned by NICU; weight: pending at time of note. 40 units of pitocin diluted in 1000cc LR was infused rapidly IV per protocol. Uterus remained boggy with brisk bleeding. Methergine 0.2mg  IM (BP low normal at time of adminstration). Cytotec 1069mcg PR given. TXA given IV. Bleeding under control. Placenta/Cord with the following complications: none. Cord pH: n/a  Intrapartum complications: Non-reassuring Fetal Status and PIH; precipitous delivery  Anesthesia:  epidural Episiotomy: none Lacerations:  none Suture Repair: n/a Est. Blood Loss (mL): 600 Sponge and instrument count were correct x2.  Mom to postpartum.  Baby to NICU. Placenta to pathology. Plans to  Breast and bottle feed Contraception: BTL Circ: [place] , SNM 10/07/2018 6:03 AM

## 2018-04-29 ENCOUNTER — Inpatient Hospital Stay (HOSPITAL_COMMUNITY)
Admission: AD | Admit: 2018-04-29 | Discharge: 2018-04-29 | Disposition: A | Discharge status: Home / Self Care | Payer: Medicaid Other | Attending: Obstetrics and Gynecology | Admitting: Obstetrics and Gynecology

## 2018-04-29 ENCOUNTER — Other Ambulatory Visit: Payer: Self-pay

## 2018-04-29 ENCOUNTER — Encounter (HOSPITAL_COMMUNITY): Payer: Self-pay

## 2018-04-29 DIAGNOSIS — Z3A09 9 weeks gestation of pregnancy: Secondary | ICD-10-CM | POA: Diagnosis not present

## 2018-04-29 DIAGNOSIS — O9989 Other specified diseases and conditions complicating pregnancy, childbirth and the puerperium: Secondary | ICD-10-CM | POA: Insufficient documentation

## 2018-04-29 DIAGNOSIS — R197 Diarrhea, unspecified: Secondary | ICD-10-CM | POA: Insufficient documentation

## 2018-04-29 DIAGNOSIS — O26891 Other specified pregnancy related conditions, first trimester: Secondary | ICD-10-CM | POA: Diagnosis not present

## 2018-04-29 DIAGNOSIS — E860 Dehydration: Secondary | ICD-10-CM | POA: Diagnosis not present

## 2018-04-29 DIAGNOSIS — Z87891 Personal history of nicotine dependence: Secondary | ICD-10-CM | POA: Diagnosis not present

## 2018-04-29 DIAGNOSIS — Z3491 Encounter for supervision of normal pregnancy, unspecified, first trimester: Secondary | ICD-10-CM

## 2018-04-29 LAB — URINALYSIS, ROUTINE W REFLEX MICROSCOPIC
Bilirubin Urine: NEGATIVE
Glucose, UA: NEGATIVE mg/dL
Ketones, ur: NEGATIVE mg/dL
Nitrite: NEGATIVE
Protein, ur: NEGATIVE mg/dL
Specific Gravity, Urine: 1.026 (ref 1.005–1.030)
pH: 5 (ref 5.0–8.0)

## 2018-04-29 LAB — POCT PREGNANCY, URINE: Preg Test, Ur: POSITIVE — AB

## 2018-04-29 MED ORDER — DEXTROSE IN LACTATED RINGERS 5 % IV SOLN
Freq: Once | INTRAVENOUS | Status: AC
  Administered 2018-04-29: 16:00:00 via INTRAVENOUS

## 2018-04-29 MED ORDER — ONDANSETRON 4 MG PO TBDP
4.0000 mg | ORAL_TABLET | Freq: Once | ORAL | Status: AC
  Administered 2018-04-29: 4 mg via ORAL
  Filled 2018-04-29: qty 1

## 2018-04-29 NOTE — MAU Provider Note (Signed)
History     CSN: [id number]  Arrival date and time: 04/29/18 1512   First Provider Initiated Contact with Patient 04/29/18 1543      Chief Complaint  Patient presents with  . Diarrhea  . Dizziness   HPI Jenna Stephens is a 43 y.o. [id number] at [redacted]w[redacted]d who presents to MAU with chief complaint of diarrhea. This is a new problem, onset 4-5 days ago. Patient also endorses new onset nausea with occasional vomiting. She denies dizziness, sob, abnormal vaginal discharge, vaginal bleeding and fever. Denies proximity to other sick people. She has not established [facility] but had IUP confirmed via ultrasound.  OB History    Gravida  6   Para  2   Term  2   Preterm  0   AB  3   Living  2     SAB  2   TAB  1   Ectopic  0   Multiple      Live Births  2           Past Medical History:  Diagnosis Date  . Anxiety   . Asthma   . Hypertension   . Iron deficiency anemia   . Obesity     Past Surgical History:  Procedure Laterality Date  . TONSILLECTOMY      Family History  Problem Relation Age of Onset  . Diabetes Mother   . Hypertension Mother     Social History   Tobacco Use  . Smoking status: Former Smoker    Packs/day: 0.25    Last attempt to quit: 01/06/2018    Years since quitting: 0.3  . Smokeless tobacco: Never Used  . Tobacco comment: stopped smoking after this hospital admission  Substance Use Topics  . Alcohol use: Not Currently    Comment: weekends only  . Drug use: Not Currently    Types: Marijuana    Comment: last use September 24 2016    Allergies: No Known Allergies  Medications Prior to Admission  Medication Sig Dispense Refill Last Dose  . amLODipine (NORVASC) 10 MG tablet Take 1 tablet (10 mg total) by mouth daily. 30 tablet 0 Taking  . buPROPion (ZYBAN) 150 MG 12 hr tablet Take 150 mg by mouth 2 (two) times daily.   Taking  . clonazePAM (KLONOPIN) 0.5 MG tablet Take 0.5 mg by mouth 2 (two) times daily as needed for anxiety.   Taking  .  cloNIDine (CATAPRES) 0.3 MG tablet Take 0.3 mg by mouth 2 (two) times daily.   Taking  . ferrous sulfate 325 (65 FE) MG tablet Take 1-2 tablets (325-650 mg total) by mouth daily with breakfast. 60 tablet 5 Taking  . fluticasone furoate-vilanterol (BREO ELLIPTA) 200-25 MCG/INH AEPB Inhale 1 puff into the lungs daily. 28 each 11 Taking  . montelukast (SINGULAIR) 10 MG tablet Take 1 tablet (10 mg total) by mouth daily. 30 tablet 2   . Multiple Vitamin (MULTIVITAMIN WITH MINERALS) TABS tablet Take 1 tablet by mouth daily. 30 tablet 0 Taking  . pantoprazole (PROTONIX) 40 MG tablet Take 1 tablet (40 mg total) by mouth daily. 90 tablet 3 Taking  . PARoxetine (PAXIL) 10 MG tablet Take 10 mg by mouth daily.   Taking  . traMADol (ULTRAM) 50 MG tablet Take 50 mg by mouth every 6 (six) hours as needed (states she was instructed to take every 4 hours as needed).   Taking  . umeclidinium bromide (INCRUSE ELLIPTA) 62.5 MCG/INH AEPB Inhale 1 puff into  the lungs daily. 30 each 11 Taking  . zolpidem (AMBIEN) 10 MG tablet Take 10 mg by mouth at bedtime as needed for sleep.   Taking    Review of Systems  Gastrointestinal: Positive for abdominal pain, diarrhea, nausea and vomiting.  Genitourinary: Negative for difficulty urinating, dyspareunia, dysuria, flank pain, vaginal bleeding, vaginal discharge and vaginal pain.  Musculoskeletal: Negative for back pain.  All other systems reviewed and are negative.  Physical Exam   Blood pressure (!) 143/83, pulse 86, temperature 98 F (36.7 C), temperature source Oral, resp. rate 20, weight 124.4 kg, last menstrual period 02/22/2018, SpO2 100 %, unknown if currently breastfeeding.  Physical Exam  Nursing note and vitals reviewed. Constitutional: She is oriented to person, place, and time. She appears well-developed and well-nourished.  HENT:  Mouth/Throat: Mucous membranes are not pale and not dry.  Eyes: Conjunctivae and lids are normal.  Cardiovascular: Normal rate,  normal heart sounds and intact distal pulses.  Respiratory: Effort normal.  GI: Soft. Bowel sounds are normal. She exhibits no distension and no mass. There is no abdominal tenderness. There is no rebound, no guarding and no CVA tenderness.  Musculoskeletal: Normal range of motion.  Neurological: She is alert and oriented to person, place, and time.  Skin: Skin is warm and dry.  Psychiatric: She has a normal mood and affect. Her behavior is normal. Judgment and thought content normal.    MAU Course/MDM   --FHT 160 by bedside ultrasound. Patient informed that the ultrasound is considered a limited obstetric ultrasound and is not intended to be a complete ultrasound exam.  Patient also informed that the ultrasound is not being completed with the intent of assessing for fetal or placental anomalies or any pelvic abnormalities.  Explained that the purpose of today's ultrasound is to assess for fetal heart rate.  Patient acknowledges the purpose of the exam and the limitations of the study.   --Patient received prescription for antiemetic from outpatient clinic today  Patient Vitals for the past 24 hrs:  BP Temp Temp src Pulse Resp SpO2 Weight  04/29/18 1738 (!) 147/84 - - 79 - - -  04/29/18 1528 (!) 143/83 98 F (36.7 C) Oral 86 20 100 % -  04/29/18 1520 - - - - - - 124.4 kg    Results for orders placed or performed during the hospital encounter of 04/29/18 (from the past 24 hour(s))  Urinalysis, Routine w reflex microscopic     Status: Abnormal   Collection Time: 04/29/18  3:31 PM  Result Value Ref Range   Color, Urine YELLOW YELLOW   APPearance HAZY (A) CLEAR   Specific Gravity, Urine 1.026 1.005 - 1.030   pH 5.0 5.0 - 8.0   Glucose, UA NEGATIVE NEGATIVE mg/dL   Hgb urine dipstick SMALL (A) NEGATIVE   Bilirubin Urine NEGATIVE NEGATIVE   Ketones, ur NEGATIVE NEGATIVE mg/dL   Protein, ur NEGATIVE NEGATIVE mg/dL   Nitrite NEGATIVE NEGATIVE   Leukocytes, UA TRACE (A) NEGATIVE   RBC /  HPF 0-5 0 - 5 RBC/hpf   WBC, UA 11-20 0 - 5 WBC/hpf   Bacteria, UA RARE (A) NONE SEEN   Squamous Epithelial / LPF 11-20 0 - 5   Mucus PRESENT   Pregnancy, urine POC     Status: Abnormal   Collection Time: 04/29/18  3:36 PM  Result Value Ref Range   Preg Test, Ur POSITIVE (A) NEGATIVE    Meds ordered this encounter  Medications  . ondansetron (ZOFRAN-ODT)  disintegrating tablet 4 mg  . dextrose 5 % in lactated ringers infusion    Assessment and Plan  --43 y.o. [id number] at [redacted]w[redacted]d  --FHT 160 bpm --Dehydration r/t new onset diarrhea --Reviewed OTC medication, diarrhea typically self-limiting first 8-10 days --Urine culture pending, follow-up PRN --Discharge home in stable condition, return to MAU for worsening symptoms  F/U: New OB appt 05/13/18  Darlina Rumpf, CNM 04/29/2018, 6:03 PM

## 2018-04-29 NOTE — MAU Note (Signed)
Pt states that she does not know if she is dehydrated she states she has had diarrhea for the last 4-5 days.   Denies vaginal bleeding.  Pt reports lower abdominal cramping.   Pt reports having a headache, feeling dizzy, and light headed.

## 2018-04-29 NOTE — Discharge Instructions (Signed)

## 2018-05-01 LAB — AMB REFERRAL TO OB-GYN: Urine Culture, OB: POSITIVE

## 2018-05-02 LAB — CULTURE, OB URINE: Culture: >=100000 — AB

## 2018-05-03 ENCOUNTER — Telehealth: Payer: Self-pay | Admitting: Student

## 2018-05-03 DIAGNOSIS — O2341 Unspecified infection of urinary tract in pregnancy, first trimester: Secondary | ICD-10-CM

## 2018-05-03 MED ORDER — AMPICILLIN 500 MG PO CAPS: 500.0000 mg | ORAL_CAPSULE | Freq: Four times a day (QID) | ORAL | 0 refills | Status: AC

## 2018-05-03 NOTE — Telephone Encounter (Signed)
Pt returned call. Verified by name & DOB. Notified of positive urine culture. NKDA. Discussed reasons to return to MAU r/t UTI in pregnancy.   Jorje Guild, NP

## 2018-05-03 NOTE — Telephone Encounter (Signed)
Called regarding + urine culture. Left voice mail.   Jorje Guild, NP

## 2018-05-13 LAB — OB RESULTS CONSOLE GC/CHLAMYDIA
Chlamydia: NEGATIVE
Gonorrhea: NEGATIVE

## 2018-05-13 LAB — OB RESULTS CONSOLE HGB/HCT, BLOOD
HCT: 34 (ref 29–41)
Hemoglobin: 10.2

## 2018-05-13 LAB — OB RESULTS CONSOLE HIV ANTIBODY (ROUTINE TESTING): HIV: NONREACTIVE

## 2018-05-13 LAB — SICKLE CELL SCREEN

## 2018-05-13 LAB — OB RESULTS CONSOLE ABO/RH: RH Type: POSITIVE

## 2018-05-13 LAB — OB RESULTS CONSOLE PLATELET COUNT: Platelets: 344

## 2018-05-13 LAB — OB RESULTS CONSOLE RUBELLA ANTIBODY, IGM: Rubella: IMMUNE

## 2018-05-13 LAB — OB RESULTS CONSOLE HEPATITIS B SURFACE ANTIGEN: Hepatitis B Surface Ag: NEGATIVE

## 2018-05-13 LAB — AMB REFERRAL TO OB-GYN: Urine Culture, OB: NEGATIVE

## 2018-05-13 LAB — OB RESULTS CONSOLE ANTIBODY SCREEN: Antibody Screen: NEGATIVE

## 2018-06-08 ENCOUNTER — Observation Stay (HOSPITAL_COMMUNITY)
Admission: AD | Admit: 2018-06-08 | Discharge: 2018-06-10 | Disposition: A | Discharge status: Home / Self Care | Payer: Medicaid Other | Source: Ambulatory Visit | Attending: Internal Medicine | Admitting: Internal Medicine

## 2018-06-08 ENCOUNTER — Inpatient Hospital Stay (HOSPITAL_COMMUNITY): Payer: Medicaid Other

## 2018-06-08 DIAGNOSIS — F419 Anxiety disorder, unspecified: Secondary | ICD-10-CM | POA: Diagnosis not present

## 2018-06-08 DIAGNOSIS — Z79899 Other long term (current) drug therapy: Secondary | ICD-10-CM | POA: Diagnosis not present

## 2018-06-08 DIAGNOSIS — R059 Cough, unspecified: Secondary | ICD-10-CM

## 2018-06-08 DIAGNOSIS — E876 Hypokalemia: Secondary | ICD-10-CM | POA: Insufficient documentation

## 2018-06-08 DIAGNOSIS — R05 Cough: Secondary | ICD-10-CM

## 2018-06-08 DIAGNOSIS — O99512 Diseases of the respiratory system complicating pregnancy, second trimester: Secondary | ICD-10-CM | POA: Diagnosis not present

## 2018-06-08 DIAGNOSIS — D72829 Elevated white blood cell count, unspecified: Secondary | ICD-10-CM | POA: Diagnosis not present

## 2018-06-08 DIAGNOSIS — O162 Unspecified maternal hypertension, second trimester: Secondary | ICD-10-CM | POA: Diagnosis not present

## 2018-06-08 DIAGNOSIS — J45901 Unspecified asthma with (acute) exacerbation: Secondary | ICD-10-CM | POA: Diagnosis present

## 2018-06-08 DIAGNOSIS — Z8249 Family history of ischemic heart disease and other diseases of the circulatory system: Secondary | ICD-10-CM | POA: Diagnosis not present

## 2018-06-08 DIAGNOSIS — D509 Iron deficiency anemia, unspecified: Secondary | ICD-10-CM | POA: Diagnosis not present

## 2018-06-08 DIAGNOSIS — R0602 Shortness of breath: Secondary | ICD-10-CM | POA: Diagnosis present

## 2018-06-08 DIAGNOSIS — R0789 Other chest pain: Secondary | ICD-10-CM

## 2018-06-08 DIAGNOSIS — K219 Gastro-esophageal reflux disease without esophagitis: Secondary | ICD-10-CM | POA: Diagnosis not present

## 2018-06-08 DIAGNOSIS — O99012 Anemia complicating pregnancy, second trimester: Secondary | ICD-10-CM | POA: Diagnosis not present

## 2018-06-08 DIAGNOSIS — J4552 Severe persistent asthma with status asthmaticus: Principal | ICD-10-CM | POA: Insufficient documentation

## 2018-06-08 DIAGNOSIS — O99282 Endocrine, nutritional and metabolic diseases complicating pregnancy, second trimester: Secondary | ICD-10-CM | POA: Insufficient documentation

## 2018-06-08 DIAGNOSIS — O99212 Obesity complicating pregnancy, second trimester: Secondary | ICD-10-CM | POA: Diagnosis not present

## 2018-06-08 DIAGNOSIS — O219 Vomiting of pregnancy, unspecified: Secondary | ICD-10-CM | POA: Diagnosis present

## 2018-06-08 DIAGNOSIS — O99342 Other mental disorders complicating pregnancy, second trimester: Secondary | ICD-10-CM | POA: Insufficient documentation

## 2018-06-08 DIAGNOSIS — Z87891 Personal history of nicotine dependence: Secondary | ICD-10-CM | POA: Diagnosis not present

## 2018-06-08 DIAGNOSIS — J45909 Unspecified asthma, uncomplicated: Secondary | ICD-10-CM

## 2018-06-08 DIAGNOSIS — J029 Acute pharyngitis, unspecified: Secondary | ICD-10-CM | POA: Diagnosis not present

## 2018-06-08 DIAGNOSIS — O99612 Diseases of the digestive system complicating pregnancy, second trimester: Secondary | ICD-10-CM | POA: Diagnosis not present

## 2018-06-08 DIAGNOSIS — Z3A15 15 weeks gestation of pregnancy: Secondary | ICD-10-CM | POA: Insufficient documentation

## 2018-06-08 DIAGNOSIS — O10912 Unspecified pre-existing hypertension complicating pregnancy, second trimester: Secondary | ICD-10-CM

## 2018-06-08 DIAGNOSIS — J45902 Unspecified asthma with status asthmaticus: Secondary | ICD-10-CM

## 2018-06-08 LAB — CBC WITH DIFFERENTIAL/PLATELET
BASOS ABS: 0 10*3/uL (ref 0.0–0.1)
Basophils Relative: 0 %
Eosinophils Absolute: 0.4 10*3/uL (ref 0.0–0.5)
Eosinophils Relative: 3 %
HCT: 34.3 % — ABNORMAL LOW (ref 36.0–46.0)
Hemoglobin: 10.7 g/dL — ABNORMAL LOW (ref 12.0–15.0)
LYMPHS PCT: 18 %
Lymphs Abs: 2.2 10*3/uL (ref 0.7–4.0)
MCH: 23.4 pg — AB (ref 26.0–34.0)
MCHC: 31.2 g/dL (ref 30.0–36.0)
MCV: 75.1 fL — ABNORMAL LOW (ref 80.0–100.0)
Monocytes Absolute: 0.7 10*3/uL (ref 0.1–1.0)
Monocytes Relative: 6 %
Neutro Abs: 8.9 10*3/uL — ABNORMAL HIGH (ref 1.7–7.7)
Neutrophils Relative %: 73 %
Platelets: 276 10*3/uL (ref 150–400)
RBC: 4.57 MIL/uL (ref 3.87–5.11)
RDW: 16.1 % — ABNORMAL HIGH (ref 11.5–15.5)
WBC: 12.2 10*3/uL — AB (ref 4.0–10.5)
nRBC: 0 % (ref 0.0–0.2)

## 2018-06-08 LAB — POCT I-STAT 7, (LYTES, BLD GAS, ICA,H+H)
Acid-base deficit: 4 mmol/L — ABNORMAL HIGH (ref 0.0–2.0)
Bicarbonate: 16.9 mmol/L — ABNORMAL LOW (ref 20.0–28.0)
Calcium, Ion: 1.16 mmol/L (ref 1.15–1.40)
HCT: 37 % (ref 36.0–46.0)
HEMOGLOBIN: 12.6 g/dL (ref 12.0–15.0)
O2 Saturation: 96 %
Patient temperature: 98.6
Potassium: 3.7 mmol/L (ref 3.5–5.1)
SODIUM: 133 mmol/L — AB (ref 135–145)
TCO2: 18 mmol/L — ABNORMAL LOW (ref 22–32)
pCO2 arterial: 20.8 mmHg — ABNORMAL LOW (ref 32.0–48.0)
pH, Arterial: 7.517 — ABNORMAL HIGH (ref 7.350–7.450)
pO2, Arterial: 73 mmHg — ABNORMAL LOW (ref 83.0–108.0)

## 2018-06-08 LAB — BASIC METABOLIC PANEL
Anion gap: 11 (ref 5–15)
BUN: 5 mg/dL — ABNORMAL LOW (ref 6–20)
CO2: 18 mmol/L — ABNORMAL LOW (ref 22–32)
Calcium: 8.9 mg/dL (ref 8.9–10.3)
Chloride: 103 mmol/L (ref 98–111)
Creatinine, Ser: 0.64 mg/dL (ref 0.44–1.00)
GFR calc Af Amer: >60 mL/min (ref >60)
GFR calc non Af Amer: >60 mL/min (ref >60)
Glucose, Bld: 147 mg/dL — ABNORMAL HIGH (ref 70–99)
Potassium: 3.3 mmol/L — ABNORMAL LOW (ref 3.5–5.1)
Sodium: 132 mmol/L — ABNORMAL LOW (ref 135–145)

## 2018-06-08 LAB — URINALYSIS, ROUTINE W REFLEX MICROSCOPIC
Bilirubin Urine: NEGATIVE
Glucose, UA: NEGATIVE mg/dL
Hgb urine dipstick: NEGATIVE
Ketones, ur: NEGATIVE mg/dL
Leukocytes,Ua: NEGATIVE
Nitrite: NEGATIVE
Protein, ur: NEGATIVE mg/dL
Specific Gravity, Urine: 1.02 (ref 1.005–1.030)
pH: 6.5 (ref 5.0–8.0)

## 2018-06-08 LAB — INFLUENZA PANEL BY PCR (TYPE A & B)
Influenza A By PCR: NEGATIVE
Influenza B By PCR: NEGATIVE

## 2018-06-08 MED ORDER — METHYLPREDNISOLONE SODIUM SUCC 125 MG IJ SOLR
125.0000 mg | Freq: Once | INTRAMUSCULAR | Status: AC
  Administered 2018-06-08: 125 mg via INTRAVENOUS
  Filled 2018-06-08: qty 2

## 2018-06-08 MED ORDER — LACTATED RINGERS IV SOLN
INTRAVENOUS | Status: DC
  Administered 2018-06-08 – 2018-06-10 (×4): via INTRAVENOUS

## 2018-06-08 MED ORDER — SODIUM CHLORIDE 0.9 % IV BOLUS
1000.0000 mL | Freq: Once | INTRAVENOUS | Status: AC
  Administered 2018-06-08: 1000 mL via INTRAVENOUS

## 2018-06-08 MED ORDER — PROMETHAZINE HCL 25 MG PO TABS
25.0000 mg | ORAL_TABLET | Freq: Once | ORAL | Status: AC
  Administered 2018-06-08: 25 mg via ORAL
  Filled 2018-06-08: qty 1

## 2018-06-08 MED ORDER — ALBUTEROL SULFATE (2.5 MG/3ML) 0.083% IN NEBU
5.0000 mg | INHALATION_SOLUTION | Freq: Once | RESPIRATORY_TRACT | Status: AC
  Administered 2018-06-09: 5 mg via RESPIRATORY_TRACT
  Filled 2018-06-08: qty 6

## 2018-06-08 MED ORDER — LABETALOL HCL 100 MG PO TABS
200.0000 mg | ORAL_TABLET | Freq: Once | ORAL | Status: AC
  Administered 2018-06-08: 200 mg via ORAL
  Filled 2018-06-08: qty 2

## 2018-06-08 MED ORDER — IPRATROPIUM-ALBUTEROL 0.5-2.5 (3) MG/3ML IN SOLN
3.0000 mL | Freq: Once | RESPIRATORY_TRACT | Status: AC
  Administered 2018-06-08: 3 mL via RESPIRATORY_TRACT
  Filled 2018-06-08: qty 3

## 2018-06-08 NOTE — ED Provider Notes (Signed)
[facility] EMERGENCY DEPARTMENT Provider Note   CSN: [id number] Arrival date & time: 06/08/18  1242     History   Chief Complaint Chief Complaint  Patient presents with  . Nasal Congestion  . Shortness of Breath  . Chest Pain    HPI Jenna Stephens is a 43 y.o. female.  HPI 43 yo female ho [id number] at [redacted] weeks gestation presents today in transfer from [facility] hospital.  She states she awoke this am and had general myalgias, cough, congestion and some dyspnea.  There she was assessed with cbc, cxr, and flu test withich was negative.  Felt that her oxygen saturations were intermittently borderline.  Patient was transferred here for further evaluation due to history of respiratory failure. Past Medical History:  Diagnosis Date  . Anxiety   . Asthma   . Hypertension   . Iron deficiency anemia   . Obesity     Patient Active Problem List   Diagnosis Date Noted  . Pregnant 04/22/2018  . Severe persistent asthma without complication 50/[id number]/[id number]  . Dyspnea 01/27/2018  . Asthma with status asthmaticus   . Acute respiratory failure ([place]) 12/29/2017  . HTN (hypertension) 12/29/2017  . Obesity 12/29/2017    Past Surgical History:  Procedure Laterality Date  . TONSILLECTOMY       OB History    Gravida  6   Para  2   Term  2   Preterm  0   AB  3   Living  2     SAB  2   TAB  1   Ectopic  0   Multiple      Live Births  2            Home Medications    Prior to Admission medications   Medication Sig Start Date End Date Taking? Authorizing Provider  amLODipine (NORVASC) 10 MG tablet Take 1 tablet (10 mg total) by mouth daily. 01/29/18 03/03/18  Kinnie Feil, PA-C  buPROPion (ZYBAN) 150 MG 12 hr tablet Take 150 mg by mouth 2 (two) times daily.    [provider]  clonazePAM (KLONOPIN) 0.5 MG tablet Take 0.5 mg by mouth 2 (two) times daily as needed for anxiety.    [provider]  cloNIDine (CATAPRES) 0.3 MG  tablet Take 0.3 mg by mouth 2 (two) times daily.    [provider]  ferrous sulfate 325 (65 FE) MG tablet Take 1-2 tablets (325-650 mg total) by mouth daily with breakfast. 04/08/18   Armbruster, Carlota Raspberry, MD  fluticasone furoate-vilanterol (BREO ELLIPTA) 200-25 MCG/INH AEPB Inhale 1 puff into the lungs daily. 01/27/18   Martyn Ehrich, NP  montelukast (SINGULAIR) 10 MG tablet Take 1 tablet (10 mg total) by mouth daily. 04/09/18 04/09/19  Marshell Garfinkel, MD  Multiple Vitamin (MULTIVITAMIN WITH MINERALS) TABS tablet Take 1 tablet by mouth daily. 01/06/18   Kayleen Memos, DO  pantoprazole (PROTONIX) 40 MG tablet Take 1 tablet (40 mg total) by mouth daily. 04/08/18   Armbruster, Carlota Raspberry, MD  PARoxetine (PAXIL) 10 MG tablet Take 10 mg by mouth daily.    [provider]  traMADol (ULTRAM) 50 MG tablet Take 50 mg by mouth every 6 (six) hours as needed (states she was instructed to take every 4 hours as needed).    [provider]  umeclidinium bromide (INCRUSE ELLIPTA) 62.5 MCG/INH AEPB Inhale 1 puff into the lungs daily. 03/19/18   Marshell Garfinkel, MD  zolpidem (  AMBIEN) 10 MG tablet Take 10 mg by mouth at bedtime as needed for sleep.    [provider]    Family History Family History  Problem Relation Age of Onset  . Diabetes Mother   . Hypertension Mother     Social History Social History   Tobacco Use  . Smoking status: Former Smoker    Packs/day: 0.25    Last attempt to quit: 01/06/2018    Years since quitting: 0.4  . Smokeless tobacco: Never Used  . Tobacco comment: stopped smoking after this hospital admission  Substance Use Topics  . Alcohol use: Not Currently    Comment: weekends only  . Drug use: Not Currently    Types: Marijuana    Comment: last use September 24 2016     Allergies   Patient has no known allergies.   Review of Systems Review of Systems  All other systems reviewed and are negative.    Physical Exam Updated Vital  Signs BP (!) 146/83 (BP Location: Right Arm)   Pulse 98   Temp 98.7 F (37.1 C) (Oral)   Resp 12   Ht 1.702 m (5\' 7" )   Wt 128.5 kg   LMP 02/22/2018   SpO2 98%   BMI 44.36 kg/m   Physical Exam Vitals signs and nursing note reviewed.  Constitutional:      Appearance: She is well-developed. She is obese.  HENT:     Head: Normocephalic and atraumatic.     Mouth/Throat:     Mouth: Mucous membranes are moist.  Eyes:     Pupils: Pupils are equal, round, and reactive to light.  Neck:     Musculoskeletal: Normal range of motion.  Cardiovascular:     Rate and Rhythm: Normal rate and regular rhythm.  Pulmonary:     Effort: Pulmonary effort is normal. Tachypnea present.     Breath sounds: Normal breath sounds.  Abdominal:     General: Bowel sounds are normal.     Palpations: Abdomen is soft.  Musculoskeletal: Normal range of motion.  Skin:    General: Skin is warm and dry.     Capillary Refill: Capillary refill takes less than 2 seconds.  Neurological:     General: No focal deficit present.     Mental Status: She is alert. She is disoriented.  Psychiatric:        Mood and Affect: Mood normal.      ED Treatments / Results  Labs (all labs ordered are listed, but only abnormal results are displayed) Labs Reviewed  CBC WITH DIFFERENTIAL/PLATELET - Abnormal; Notable for the following components:      Result Value   WBC 12.2 (*)    Hemoglobin 10.7 (*)    HCT 34.3 (*)    MCV 75.1 (*)    MCH 23.4 (*)    RDW 16.1 (*)    Neutro Abs 8.9 (*)    All other components within normal limits  URINALYSIS, ROUTINE W REFLEX MICROSCOPIC  INFLUENZA PANEL BY PCR (TYPE A & B)    EKG None ED ECG REPORT   Date: 06/08/2018  Rate: 95  Rhythm: normal sinus rhythm  QRS Axis: normal  Intervals: normal  ST/T Wave abnormalities: normal  Conduction Disutrbances:none  Narrative Interpretation:   Old EKG Reviewed: none available  I have personally reviewed the EKG tracing and agree with  the computerized printout as noted.  Radiology Dg Chest 2 View  Result Date: 06/08/2018 CLINICAL DATA:  Fifteen weeks pregnant.  Congestion EXAM: CHEST - 2 VIEW COMPARISON:  None. FINDINGS: Normal mediastinum and cardiac silhouette. Normal pulmonary vasculature. No evidence of effusion, infiltrate, or pneumothorax. No acute bony abnormality. IMPRESSION: No acute cardiopulmonary process. Electronically Signed   By: Suzy Bouchard M.D.   On: 06/08/2018 17:32    Procedures Procedures (including critical care time)  Medications Ordered in ED Medications  lactated ringers infusion ( Intravenous New Bag/Given 06/08/18 1711)  ipratropium-albuterol (DUONEB) 0.5-2.5 (3) MG/3ML nebulizer solution 3 mL (3 mLs Nebulization Given 06/08/18 1451)  promethazine (PHENERGAN) tablet 25 mg (25 mg Oral Given 06/08/18 1436)  labetalol (NORMODYNE) tablet 200 mg (200 mg Oral Given 06/08/18 1436)  methylPREDNISolone sodium succinate (SOLU-MEDROL) 125 mg/2 mL injection 125 mg (125 mg Intravenous Given 06/08/18 1707)     Initial Impression / Assessment and Plan / ED Course  I have reviewed the triage vital signs and the nursing notes.  Pertinent labs & imaging results that were available during my care of the patient were reviewed by me and considered in my medical decision making (see chart for details).   -year-old female [redacted] weeks pregnant presents today with dyspnea.  She was seen at [facility] and sent here with diagnosis of persistent asthma with status asthmaticus.  Here she is tachypneic and had increased work of breathing.  However she is moving air well and is not having obvious wheezing.  Blood gases significant for pH is 7.51, PCO2 of 20, and PO2 of 73.  Given her respiratory alkalosis, will obtain d-dimer.  In the interim, she is given repeat albuterol nebulizers.  Care is discussed with Dr. Kathrynn Humble and he will follow patient.      Final Clinical Impressions(s) / ED Diagnoses   Final  diagnoses:  Persistent asthma with status asthmaticus, unspecified asthma severity  Nausea/vomiting in pregnancy  Chronic hypertension complicating or reason for care during pregnancy, second trimester    ED Discharge Orders    None       Pattricia Boss, MD 06/09/18 0005

## 2018-06-08 NOTE — MAU Provider Note (Signed)
Chief Complaint: Nasal Congestion; Shortness of Breath; and Chest Pain   First Provider Initiated Contact with Patient 06/08/18 1359     SUBJECTIVE HPI: Jenna Stephens is a 43 y.o. [id number] at [redacted]w[redacted]d who presents to Maternity Admissions reporting congestion, sore throat and body aches since 06/05/2018 and shortness of breath of breath, mild cough and exacerbation of asthma symptoms since yesterday (06/07/2018.  Has a history of asthma for which she takes Singulair, Brio inhaler and Incruse Ellipta daily.  She does not use a rescue inhaler.  Was intubated for respiratory failure from status asthmaticus from upper respiratory infection in September 2019.  Sees Dr. Vaughan Browner at [facility] pulmonology  Associated signs and symptoms: Positive for body aches, nausea (throughout pregnancy).  Negative for chest pain, cough, hemoptysis, leg pain, fever, chills, sick contacts.  History of chronic hypertension.  Was switched to labetalol 200 mg 3 times daily when she became pregnant which she reports has been controlling her blood pressure well.  Took morning dose today.  Gets prenatal care at [facility].  Past Medical History:  Diagnosis Date  . Anxiety   . Asthma   . Hypertension   . Iron deficiency anemia   . Obesity    OB History  Gravida Para Term Preterm AB Living  6 2 2  0 3 2  SAB TAB Ectopic Multiple Live Births  2 1 0   2    # Outcome Date GA Lbr Len/2nd Weight Sex Delivery Anes PTL Lv  6 Current           5 SAB 2019          4 TAB 2012          3 Term 2011     Vag-Spont   LIV  2 Term 2001     Vag-Spont   LIV  1 SAB            Past Surgical History:  Procedure Laterality Date  . TONSILLECTOMY     Social History   Socioeconomic History  . Marital status: Single    Spouse name: Not on file  . Number of children: Not on file  . Years of education: Not on file  . Highest education level: Not on file  Occupational History  . Not on file  Social Needs  . Financial resource  strain: Not on file  . Food insecurity:    Worry: Not on file    Inability: Not on file  . Transportation needs:    Medical: Not on file    Non-medical: Not on file  Tobacco Use  . Smoking status: Former Smoker    Packs/day: 0.25    Last attempt to quit: 01/06/2018    Years since quitting: 0.4  . Smokeless tobacco: Never Used  . Tobacco comment: stopped smoking after this hospital admission  Substance and Sexual Activity  . Alcohol use: Not Currently    Comment: weekends only  . Drug use: Not Currently    Types: Marijuana    Comment: last use September 24 2016  . Sexual activity: Yes    Birth control/protection: None  Lifestyle  . Physical activity:    Days per week: Not on file    Minutes per session: Not on file  . Stress: Not on file  Relationships  . Social connections:    Talks on phone: Not on file    Gets together: Not on file    Attends religious service: Not on file  Active member of club or organization: Not on file    Attends meetings of clubs or organizations: Not on file    Relationship status: Not on file  . Intimate partner violence:    Fear of current or ex partner: Not on file    Emotionally abused: Not on file    Physically abused: Not on file    Forced sexual activity: Not on file  Other Topics Concern  . Not on file  Social History Narrative   ** Merged History Encounter **       Family History  Problem Relation Age of Onset  . Diabetes Mother   . Hypertension Mother    No current facility-administered medications on file prior to encounter.    Current Outpatient Medications on File Prior to Encounter  Medication Sig Dispense Refill  . amLODipine (NORVASC) 10 MG tablet Take 1 tablet (10 mg total) by mouth daily. 30 tablet 0  . buPROPion (ZYBAN) 150 MG 12 hr tablet Take 150 mg by mouth 2 (two) times daily.    . clonazePAM (KLONOPIN) 0.5 MG tablet Take 0.5 mg by mouth 2 (two) times daily as needed for anxiety.    . cloNIDine (CATAPRES) 0.3 MG  tablet Take 0.3 mg by mouth 2 (two) times daily.    . ferrous sulfate 325 (65 FE) MG tablet Take 1-2 tablets (325-650 mg total) by mouth daily with breakfast. 60 tablet 5  . fluticasone furoate-vilanterol (BREO ELLIPTA) 200-25 MCG/INH AEPB Inhale 1 puff into the lungs daily. 28 each 11  . montelukast (SINGULAIR) 10 MG tablet Take 1 tablet (10 mg total) by mouth daily. 30 tablet 2  . Multiple Vitamin (MULTIVITAMIN WITH MINERALS) TABS tablet Take 1 tablet by mouth daily. 30 tablet 0  . pantoprazole (PROTONIX) 40 MG tablet Take 1 tablet (40 mg total) by mouth daily. 90 tablet 3  . PARoxetine (PAXIL) 10 MG tablet Take 10 mg by mouth daily.    . traMADol (ULTRAM) 50 MG tablet Take 50 mg by mouth every 6 (six) hours as needed (states she was instructed to take every 4 hours as needed).    Marland Kitchen umeclidinium bromide (INCRUSE ELLIPTA) 62.5 MCG/INH AEPB Inhale 1 puff into the lungs daily. 30 each 11  . zolpidem (AMBIEN) 10 MG tablet Take 10 mg by mouth at bedtime as needed for sleep.     No Known Allergies  I have reviewed patient's Past Medical Hx, Surgical Hx, Family Hx, Social Hx, medications and allergies.   Review of Systems  Constitutional: Negative for chills and fever.  HENT: Positive for congestion, rhinorrhea and sore throat. Negative for ear pain and sinus pressure.   Respiratory: Positive for cough, chest tightness, shortness of breath and wheezing.   Cardiovascular: Negative for chest pain, palpitations and leg swelling.  Gastrointestinal: Negative for abdominal pain.  Genitourinary: Negative for vaginal bleeding.    OBJECTIVE Patient Vitals for the past 24 hrs:  BP Temp Pulse Resp SpO2 Height Weight  06/08/18 1932 125/81 - (!) 101 - 97 % - -  06/08/18 1916 (!) 158/97 - 93 - 96 % - -  06/08/18 1901 (!) 164/96 - (!) 102 - - - -  06/08/18 1900 - - - - 99 % - -  06/08/18 1825 - - - - 96 % - -  06/08/18 1820 - - - - 97 % - -  06/08/18 1816 (!) 149/83 - 93 - - - -  06/08/18 1815 - - - -  97 % - -  06/08/18 1810 - - - - 98 % - -  06/08/18 1805 - - - - 97 % - -  06/08/18 1802 (!) 155/80 - 97 - - - -  06/08/18 1800 - - - - 97 % - -  06/08/18 1755 - - - - 97 % - -  06/08/18 1750 - - - - 98 % - -  06/08/18 1746 (!) 155/96 - 92 - - - -  06/08/18 1745 - - - - 97 % - -  06/08/18 1735 - - - - 97 % - -  06/08/18 1731 (!) 154/88 - 93 - - - -  06/08/18 1730 - - - - 98 % - -  06/08/18 1725 - - - - 98 % - -  06/08/18 1716 (!) 152/98 - 96 - - - -  06/08/18 1714 (!) 152/86 - 94 - - - -  06/08/18 1640 - - - - 96 % - -  06/08/18 1635 - - - - 96 % - -  06/08/18 1630 - - - - 98 % - -  06/08/18 1625 - - - - 96 % - -  06/08/18 1620 - - - - 98 % - -  06/08/18 1615 - - - - 97 % - -  06/08/18 1610 - - - - 97 % - -  06/08/18 1605 - - - - 94 % - -  06/08/18 1600 - - - - 93 % - -  06/08/18 1555 - - - - 93 % - -  06/08/18 1550 - - - - 95 % - -  06/08/18 1545 - - - - 95 % - -  06/08/18 1535 - - - - 95 % - -  06/08/18 1531 (!) 142/83 - 94 - - - -  06/08/18 1530 - - - - 95 % - -  06/08/18 1517 133/76 - 89 - - - -  06/08/18 1515 - - - - 94 % - -  06/08/18 1502 (!) 143/102 - 92 - - - -  06/08/18 1500 - - - - 100 % - -  06/08/18 1451 - - - - 97 % - -  06/08/18 1447 (!) 160/92 - 92 - - - -  06/08/18 1435 - - - - 98 % - -  06/08/18 1431 (!) 159/94 - 95 - - - -  06/08/18 1430 - - - - 98 % - -  06/08/18 1420 - - - - 98 % - -  06/08/18 1416 (!) 159/87 - 94 - 98 % - -  06/08/18 1405 - - - - 99 % - -  06/08/18 1402 (!) 165/99 - 97 - - - -  06/08/18 1400 - - - - 98 % - -  06/08/18 1347 (!) 179/105 - 99 - - - -  06/08/18 1331 (!) 161/97 - 92 - - - -  06/08/18 1324 (!) 155/102 - 96 - - - -  06/08/18 1301 (!) 160/96 97.9 F (36.6 C) 96 20 95 % - -  06/08/18 1257 - - - - - 5\' 7"  (1.702 m) 128.5 kg   Constitutional: Well-developed, well-nourished female in no acute distress. Mildly ill-apearing.  HEENT: +Nasal congestion Cardiovascular: normal rate and rhythm. No M/R/G Respiratory: Slight  increased work of breathing.  Normal rate and effort.  Scant, scattered wheezes bilaterally.  Questionable decreased breath sounds on left.  Exam limited by body habitus.  No rhonchi. GI: Abd soft, non-tender, gravid appropriate for gestational age. Neurologic:  Alert and oriented x 4.  GU: Deferred  Fetal heart rate 135 by Doppler.  LAB RESULTS Results for orders placed or performed during the hospital encounter of 06/08/18 (from the past 24 hour(s))  Urinalysis, Routine w reflex microscopic     Status: None   Collection Time: 06/08/18  1:07 PM  Result Value Ref Range   Color, Urine YELLOW YELLOW   APPearance CLEAR CLEAR   Specific Gravity, Urine 1.020 1.005 - 1.030   pH 6.5 5.0 - 8.0   Glucose, UA NEGATIVE NEGATIVE mg/dL   Hgb urine dipstick NEGATIVE NEGATIVE   Bilirubin Urine NEGATIVE NEGATIVE   Ketones, ur NEGATIVE NEGATIVE mg/dL   Protein, ur NEGATIVE NEGATIVE mg/dL   Nitrite NEGATIVE NEGATIVE   Leukocytes,Ua NEGATIVE NEGATIVE  Influenza panel by PCR (type A & B)     Status: None   Collection Time: 06/08/18  1:18 PM  Result Value Ref Range   Influenza A By PCR NEGATIVE NEGATIVE   Influenza B By PCR NEGATIVE NEGATIVE  CBC with Differential/Platelet     Status: Abnormal   Collection Time: 06/08/18  4:31 PM  Result Value Ref Range   WBC 12.2 (H) 4.0 - 10.5 K/uL   RBC 4.57 3.87 - 5.11 MIL/uL   Hemoglobin 10.7 (L) 12.0 - 15.0 g/dL   HCT 34.3 (L) 36.0 - 46.0 %   MCV 75.1 (L) 80.0 - 100.0 fL   MCH 23.4 (L) 26.0 - 34.0 pg   MCHC 31.2 30.0 - 36.0 g/dL   RDW 16.1 (H) 11.5 - 15.5 %   Platelets 276 150 - 400 K/uL   nRBC 0.0 0.0 - 0.2 %   Neutrophils Relative % 73 %   Neutro Abs 8.9 (H) 1.7 - 7.7 K/uL   Lymphocytes Relative 18 %   Lymphs Abs 2.2 0.7 - 4.0 K/uL   Monocytes Relative 6 %   Monocytes Absolute 0.7 0.1 - 1.0 K/uL   Eosinophils Relative 3 %   Eosinophils Absolute 0.4 0.0 - 0.5 K/uL   Basophils Relative 0 %   Basophils Absolute 0.0 0.0 - 0.1 K/uL    IMAGING Dg  Chest 2 View  Result Date: 06/08/2018 CLINICAL DATA:  Fifteen weeks pregnant.  Congestion EXAM: CHEST - 2 VIEW COMPARISON:  None. FINDINGS: Normal mediastinum and cardiac silhouette. Normal pulmonary vasculature. No evidence of effusion, infiltrate, or pneumothorax. No acute bony abnormality. IMPRESSION: No acute cardiopulmonary process. Electronically Signed   By: Suzy Bouchard M.D.   On: 06/08/2018 17:32   EKG NSR  MAU COURSE Orders Placed This Encounter  Procedures  . DG Chest 2 View  . Urinalysis, Routine w reflex microscopic  . Influenza panel by PCR (type A & B)  . CBC with Differential/Platelet  . Vital signs  . Droplet Isolation  . Pulse oximetry, continuous  . EKG 12-Lead  . Insert peripheral IV   Meds ordered this encounter  Medications  . ipratropium-albuterol (DUONEB) 0.5-2.5 (3) MG/3ML nebulizer solution 3 mL  . promethazine (PHENERGAN) tablet 25 mg  . labetalol (NORMODYNE) tablet 200 mg  . methylPREDNISolone sodium succinate (SOLU-MEDROL) 125 mg/2 mL injection 125 mg  . lactated ringers infusion    Pt reports no improvement w/ Duoneb. Wheezing resolved per auscultation.   Discussed Hx, labs, exam w/ Dr. Harolyn Rutherford. Agrees w/ POC. New orders: Solumedrol.  No improvement 2 hours after Solumedrol. RR 20. O2Sats 92-98% on room air.   Updated Dr. Harolyn Rutherford. Recommends Discussing transfer w/ Private OB. Discussed w/ Dr. Tomi Bamberger at  [place] who does not have any other suggestions, but offered to eval pt at ED as needed. Dr. Carlis Abbott agrees with transfer to [facility] ED as does pt.   MDM - Asthma exacerbation 2/2 viral respiratory infection. Flu PCr neg. Pt reports no improvement after Duoneb and Solumedrol. O2 Sats intermittently borderline. Considering Pt's Hx of respiratory failure in similar situation will transfer her to ED for further eval and management. ED providers can consult Dr. Carlis Abbott PRN for pregnancy related concerns.   - Pregnancy state incidental .    ASSESSMENT 1.  Persistent asthma with status asthmaticus, unspecified asthma severity   2. Cough   3. SOB (shortness of breath)   4. [redacted] weeks gestation of pregnancy   5. Asthma affecting pregnancy in second trimester   6. Nausea/vomiting in pregnancy   7. Chronic hypertension complicating or reason for care during pregnancy, second trimester     PLAN Transfer to [facility] ED per consult w/ Dr. Harolyn Rutherford and Dr. Carlis Abbott.   Tamala Julian, [place], [place] 06/08/2018  8:08 PM

## 2018-06-08 NOTE — MAU Note (Signed)
Jenna Stephens is a 43 y.o. at [redacted]w[redacted]d here in MAU reporting: feeling sick for the past few days, feeling congested and SOB, also having chest tightness. Hx of asthma. Has a cough and sore throat also, no fever.  Onset of complaint: 3-4 days  Pain score: 6/10  Vitals:   06/08/18 1301  BP: (!) 160/96  Pulse: 96  Resp: 20  Temp: 97.9 F (36.6 C)  SpO2: 95%      Lab orders placed from triage: UA

## 2018-06-08 NOTE — MAU Note (Signed)
Pt c/o flu like symptoms, cough, chills, congestion and HA.  Has a history of respiratory complications and asthma.

## 2018-06-09 ENCOUNTER — Other Ambulatory Visit: Payer: Self-pay

## 2018-06-09 ENCOUNTER — Encounter (HOSPITAL_COMMUNITY): Payer: Self-pay | Admitting: Emergency Medicine

## 2018-06-09 DIAGNOSIS — R0789 Other chest pain: Secondary | ICD-10-CM

## 2018-06-09 DIAGNOSIS — D72829 Elevated white blood cell count, unspecified: Secondary | ICD-10-CM

## 2018-06-09 DIAGNOSIS — J45901 Unspecified asthma with (acute) exacerbation: Secondary | ICD-10-CM | POA: Diagnosis present

## 2018-06-09 DIAGNOSIS — K219 Gastro-esophageal reflux disease without esophagitis: Secondary | ICD-10-CM

## 2018-06-09 DIAGNOSIS — E876 Hypokalemia: Secondary | ICD-10-CM

## 2018-06-09 LAB — MAGNESIUM: Magnesium: 1.8 mg/dL (ref 1.7–2.4)

## 2018-06-09 LAB — POCT I-STAT 7, (LYTES, BLD GAS, ICA,H+H)
Acid-base deficit: 6 mmol/L — ABNORMAL HIGH (ref 0.0–2.0)
Bicarbonate: 17.4 mmol/L — ABNORMAL LOW (ref 20.0–28.0)
Calcium, Ion: 1.2 mmol/L (ref 1.15–1.40)
HCT: 33 % — ABNORMAL LOW (ref 36.0–46.0)
Hemoglobin: 11.2 g/dL — ABNORMAL LOW (ref 12.0–15.0)
O2 Saturation: 97 %
Patient temperature: 98.3
Potassium: 3.5 mmol/L (ref 3.5–5.1)
Sodium: 133 mmol/L — ABNORMAL LOW (ref 135–145)
TCO2: 18 mmol/L — AB (ref 22–32)
pCO2 arterial: 25.9 mmHg — ABNORMAL LOW (ref 32.0–48.0)
pH, Arterial: 7.435 (ref 7.350–7.450)
pO2, Arterial: 87 mmHg (ref 83.0–108.0)

## 2018-06-09 LAB — D-DIMER, QUANTITATIVE: D-Dimer, Quant: <0.27 ug/mL-FEU (ref 0.00–0.50)

## 2018-06-09 MED ORDER — METHYLPREDNISOLONE SODIUM SUCC 125 MG IJ SOLR
60.0000 mg | Freq: Three times a day (TID) | INTRAMUSCULAR | Status: DC
  Administered 2018-06-09 – 2018-06-10 (×4): 60 mg via INTRAVENOUS
  Filled 2018-06-09 (×4): qty 2

## 2018-06-09 MED ORDER — FERROUS SULFATE 325 (65 FE) MG PO TABS
325.0000 mg | ORAL_TABLET | Freq: Every day | ORAL | Status: DC
  Administered 2018-06-09 – 2018-06-10 (×2): 325 mg via ORAL
  Filled 2018-06-09 (×2): qty 1

## 2018-06-09 MED ORDER — ALBUTEROL SULFATE (2.5 MG/3ML) 0.083% IN NEBU
5.0000 mg | INHALATION_SOLUTION | Freq: Once | RESPIRATORY_TRACT | Status: AC
  Administered 2018-06-09: 5 mg via RESPIRATORY_TRACT
  Filled 2018-06-09: qty 6

## 2018-06-09 MED ORDER — PROMETHAZINE HCL 25 MG PO TABS
25.0000 mg | ORAL_TABLET | Freq: Two times a day (BID) | ORAL | Status: DC
  Administered 2018-06-09 – 2018-06-10 (×3): 25 mg via ORAL
  Filled 2018-06-09 (×3): qty 1

## 2018-06-09 MED ORDER — IPRATROPIUM-ALBUTEROL 0.5-2.5 (3) MG/3ML IN SOLN
3.0000 mL | Freq: Two times a day (BID) | RESPIRATORY_TRACT | Status: DC
  Administered 2018-06-09 – 2018-06-10 (×2): 3 mL via RESPIRATORY_TRACT
  Filled 2018-06-09 (×2): qty 3

## 2018-06-09 MED ORDER — POTASSIUM CHLORIDE CRYS ER 20 MEQ PO TBCR
40.0000 meq | EXTENDED_RELEASE_TABLET | Freq: Once | ORAL | Status: AC
  Administered 2018-06-09: 40 meq via ORAL
  Filled 2018-06-09: qty 2

## 2018-06-09 MED ORDER — LEVALBUTEROL HCL 0.63 MG/3ML IN NEBU
0.6300 mg | INHALATION_SOLUTION | Freq: Four times a day (QID) | RESPIRATORY_TRACT | Status: DC
  Administered 2018-06-09 (×3): 0.63 mg via RESPIRATORY_TRACT
  Filled 2018-06-09 (×4): qty 3

## 2018-06-09 MED ORDER — IPRATROPIUM-ALBUTEROL 0.5-2.5 (3) MG/3ML IN SOLN: 3.0000 mL | Freq: Four times a day (QID) | RESPIRATORY_TRACT | Status: DC

## 2018-06-09 MED ORDER — IPRATROPIUM BROMIDE 0.02 % IN SOLN
0.5000 mg | Freq: Four times a day (QID) | RESPIRATORY_TRACT | Status: DC
  Administered 2018-06-09 (×3): 0.5 mg via RESPIRATORY_TRACT
  Filled 2018-06-09 (×4): qty 2.5

## 2018-06-09 MED ORDER — LABETALOL HCL 200 MG PO TABS
200.0000 mg | ORAL_TABLET | Freq: Three times a day (TID) | ORAL | Status: DC
  Administered 2018-06-09 – 2018-06-10 (×4): 200 mg via ORAL
  Filled 2018-06-09 (×4): qty 1

## 2018-06-09 MED ORDER — ACETAMINOPHEN 325 MG PO TABS
650.0000 mg | ORAL_TABLET | Freq: Four times a day (QID) | ORAL | Status: DC | PRN
  Administered 2018-06-09 (×3): 650 mg via ORAL
  Filled 2018-06-09 (×3): qty 2

## 2018-06-09 MED ORDER — MONTELUKAST SODIUM 10 MG PO TABS
10.0000 mg | ORAL_TABLET | Freq: Every day | ORAL | Status: DC
  Administered 2018-06-09 – 2018-06-10 (×2): 10 mg via ORAL
  Filled 2018-06-09 (×3): qty 1

## 2018-06-09 MED ORDER — PANTOPRAZOLE SODIUM 40 MG PO TBEC
40.0000 mg | DELAYED_RELEASE_TABLET | Freq: Every day | ORAL | Status: DC
  Administered 2018-06-09 – 2018-06-10 (×2): 40 mg via ORAL
  Filled 2018-06-09 (×2): qty 1

## 2018-06-09 MED ORDER — ENOXAPARIN SODIUM 40 MG/0.4ML ~~LOC~~ SOLN
40.0000 mg | SUBCUTANEOUS | Status: DC
  Administered 2018-06-09: 40 mg via SUBCUTANEOUS
  Filled 2018-06-09 (×3): qty 0.4

## 2018-06-09 NOTE — H&P (Signed)
History and Physical    Jenna Stephens PJK:[id number] DOB: 01/15/1976 DOA: 06/08/2018  PCP: Olga Millers, MD Patient coming from: [facility] hospital  Chief Complaint: Shortness of breath, chest tightness  HPI: Jenna Stephens is a 43 y.o. female with medical history significant of g6p2a3 at [redacted] weeks gestation, asthma, hypertension, anxiety presenting as a transfer from [facility] with a 4-day history of dyspnea, cough, wheezing, and chest tightness.  Also reports having rhinorrhea, sneezing, and a slightly sore throat.  Reports compliance with her home COPD inhalers.  Denies having any fevers or chills.  Denies any history of blood clots.  Denies having any calf pain, erythema, or edema.  Review of Systems: As per HPI otherwise 10 point review of systems negative.  Past Medical History:  Diagnosis Date  . Anxiety   . Asthma   . Hypertension   . Iron deficiency anemia   . Obesity     Past Surgical History:  Procedure Laterality Date  . TONSILLECTOMY       reports that she quit smoking about 5 months ago. She smoked 0.25 packs per day. She has never used smokeless tobacco. She reports previous alcohol use. She reports previous drug use. Drug: Marijuana.  No Known Allergies  Family History  Problem Relation Age of Onset  . Diabetes Mother   . Hypertension Mother     Prior to Admission medications   Medication Sig Start Date End Date Taking? Authorizing Provider  ferrous sulfate 325 (65 FE) MG tablet Take 1-2 tablets (325-650 mg total) by mouth daily with breakfast. 04/08/18  Yes Armbruster, Carlota Raspberry, MD  fluticasone furoate-vilanterol (BREO ELLIPTA) 200-25 MCG/INH AEPB Inhale 1 puff into the lungs daily. 01/27/18  Yes Martyn Ehrich, NP  labetalol (NORMODYNE) 200 MG tablet Take 1 tablet by mouth 3 (three) times daily.   Yes [provider]  montelukast (SINGULAIR) 10 MG tablet Take 1 tablet (10 mg total) by mouth daily. 04/09/18 04/09/19 Yes Mannam,  Praveen, MD  pantoprazole (PROTONIX) 40 MG tablet Take 1 tablet (40 mg total) by mouth daily. 04/08/18  Yes Armbruster, Carlota Raspberry, MD  promethazine (PHENERGAN) 25 MG tablet Take 1 tablet by mouth 2 (two) times daily.   Yes [provider]  umeclidinium bromide (INCRUSE ELLIPTA) 62.5 MCG/INH AEPB Inhale 1 puff into the lungs daily. 03/19/18  Yes Mannam, Praveen, MD  zolpidem (AMBIEN) 10 MG tablet Take 10 mg by mouth at bedtime as needed for sleep.   Yes [provider]  amLODipine (NORVASC) 10 MG tablet Take 1 tablet (10 mg total) by mouth daily. Patient not taking: Reported on 06/08/2018 01/29/18 03/03/18  Kinnie Feil, PA-C  Multiple Vitamin (MULTIVITAMIN WITH MINERALS) TABS tablet Take 1 tablet by mouth daily. Patient not taking: Reported on 06/08/2018 01/06/18   Kayleen Memos, DO    Physical Exam: Vitals:   06/09/18 0115 06/09/18 0143 06/09/18 0145 06/09/18 0436  BP: (!) 149/79 (!) 145/75 132/66 (!) 152/88  Pulse: (!) 111 (!) 114 (!) 109 (!) 102  Resp: 18 (!) 23 (!) 25 (!) 21  Temp:      TempSrc:      SpO2: 93% 93% 96% 97%  Weight:      Height:        Physical Exam  Constitutional: She is oriented to person, place, and time. She appears well-developed and well-nourished. No distress.  HENT:  Head: Normocephalic.  Mouth/Throat: Oropharynx is clear and moist.  Eyes: Right eye exhibits no discharge.  Left eye exhibits no discharge.  Neck: Neck supple.  Cardiovascular: Normal rate, regular rhythm and intact distal pulses.  Pulmonary/Chest: Effort normal. No respiratory distress. She has wheezes. She has no rales.  Speaking clearly in full sentences No increased work of breathing Very mild end expiratory wheezing  Abdominal: Soft. Bowel sounds are normal. She exhibits no distension. There is no abdominal tenderness. There is no guarding.  Musculoskeletal:        General: No edema.     Comments: Bilateral lower extremities symmetric in size.  No tenderness,  erythema, increased warmth, or edema.  Neurological: She is alert and oriented to person, place, and time.  Skin: Skin is warm and dry. She is not diaphoretic.     Labs on Admission: I have personally reviewed following labs and imaging studies  CBC: Recent Labs  Lab 06/08/18 1631 06/08/18 2305 06/09/18 0333  WBC 12.2*  --   --   NEUTROABS 8.9*  --   --   HGB 10.7* 12.6 11.2*  HCT 34.3* 37.0 33.0*  MCV 75.1*  --   --   PLT 276  --   --    Basic Metabolic Panel: Recent Labs  Lab 06/08/18 2208 06/08/18 2305 06/09/18 0333  NA 132* 133* 133*  K 3.3* 3.7 3.5  CL 103  --   --   CO2 18*  --   --   GLUCOSE 147*  --   --   BUN 5*  --   --   CREATININE 0.64  --   --   CALCIUM 8.9  --   --    GFR: Estimated Creatinine Clearance: 130.5 mL/min (by C-G formula based on SCr of 0.64 mg/dL). Liver Function Tests: No results for input(s): AST, ALT, ALKPHOS, BILITOT, PROT, ALBUMIN in the last 168 hours. No results for input(s): LIPASE, AMYLASE in the last 168 hours. No results for input(s): AMMONIA in the last 168 hours. Coagulation Profile: No results for input(s): INR, PROTIME in the last 168 hours. Cardiac Enzymes: No results for input(s): CKTOTAL, CKMB, CKMBINDEX, TROPONINI in the last 168 hours. BNP (last 3 results) Recent Labs    01/27/18 1307  PROBNP 59.0   HbA1C: No results for input(s): HGBA1C in the last 72 hours. CBG: No results for input(s): GLUCAP in the last 168 hours. Lipid Profile: No results for input(s): CHOL, HDL, LDLCALC, TRIG, CHOLHDL, LDLDIRECT in the last 72 hours. Thyroid Function Tests: No results for input(s): TSH, T4TOTAL, FREET4, T3FREE, THYROIDAB in the last 72 hours. Anemia Panel: No results for input(s): VITAMINB12, FOLATE, FERRITIN, TIBC, IRON, RETICCTPCT in the last 72 hours. Urine analysis:    Component Value Date/Time   COLORURINE YELLOW 06/08/2018 [place] 06/08/2018 1307   LABSPEC 1.020 06/08/2018 1307   PHURINE  6.5 06/08/2018 1307   GLUCOSEU NEGATIVE 06/08/2018 1307   HGBUR NEGATIVE 06/08/2018 1307   BILIRUBINUR NEGATIVE 06/08/2018 [place] 06/08/2018 1307   PROTEINUR NEGATIVE 06/08/2018 1307   UROBILINOGEN 1.0 06/10/2014 1200   NITRITE NEGATIVE 06/08/2018 [place] 06/08/2018 1307    Radiological Exams on Admission: Dg Chest 2 View  Result Date: 06/08/2018 CLINICAL DATA:  Fifteen weeks pregnant.  Congestion EXAM: CHEST - 2 VIEW COMPARISON:  None. FINDINGS: Normal mediastinum and cardiac silhouette. Normal pulmonary vasculature. No evidence of effusion, infiltrate, or pneumothorax. No acute bony abnormality. IMPRESSION: No acute cardiopulmonary process. Electronically Signed   By: Suzy Bouchard M.D.   On: 06/08/2018  17:32    EKG: Independently reviewed.  Normal sinus rhythm.  No significant change since prior tracing.  Assessment/Plan Principal Problem:   Asthma exacerbation Active Problems:   Leukocytosis   Chest tightness   Hypokalemia   GERD (gastroesophageal reflux disease)   Acute asthma exacerbation -Likely precipitated by recent viral URI.  Patient reports compliance with home inhalers. -Wheezing.  Tachycardic and tachypneic.  pH 7.43, PCO2 25, PO2 87.  Respiratory alkalosis likely secondary to hyperventilation. Received DuoNeb treatments and Solu-Medrol 125 mg once in the ED.  Chest x-ray negative for acute finding.  At present, not tachypneic but continues to have very mild end expiratory wheezing.  Not hypoxic.  Influenza panel negative.  PE less likely as patient is not hypoxic and d-dimer negative.  No clinical signs of DVT on exam. -Continue Solu-Medrol 60 mg every 8 hours -Levalbuterol-ipratropium every 6 hours -Continue Singulair  Mild leukocytosis Possibly reactive.  White count 12.2.  Afebrile.  Chest x-ray negative for pneumonia.  UA not suggestive of infection. -Continue to monitor CBC  Chest tightness Likely related to  bronchospasm.  EKG not suggestive of ACS.  Patient appears comfortable on exam and in no distress. -Cardiac monitoring  Hypokalemia Potassium 3.3. -Replete potassium.  Check magnesium level.  Continue to monitor BMP.  GERD -Continue PPI  Pregnancy G6p2a3 at [redacted] weeks gestation.  -She will need continued follow-up with OB/GYN. -Continue home Phenergan for nausea  Hypertension -Continue home labetalol  Chronic microcytic anemia -Hemoglobin 10.7, stable.  Continue iron supplement.  DVT prophylaxis: Lovenox Code Status: Full code Family Communication: Family at bedside. Disposition Plan: Anticipate discharge in 1 to 2 days. Consults called: None Admission status: Observation telemetry   Shela Leff MD [facility] Pager [phone]  If 7PM-7AM, please contact night-coverage [email or web address] Password [facility]  06/09/2018, 4:59 AM

## 2018-06-09 NOTE — Progress Notes (Signed)
RT to give nebulizer. Patient transferred OTC to 2W.  Report called to receiving RT.

## 2018-06-09 NOTE — ED Provider Notes (Signed)
  Physical Exam  BP (!) 152/88   Pulse (!) 102   Temp 98.7 F (37.1 C) (Oral)   Resp (!) 21   Ht 5\' 7"  (1.702 m)   Wt 128.5 kg   LMP 02/22/2018   SpO2 97%   BMI 44.36 kg/m   Physical Exam  ED Course/Procedures     Procedures  MDM   I assumed care of this patient from Dr. Jeanell Sparrow. Patient was sent to the ER from [facility] for shortness of breath.  Patient had a d-dimer that was ordered to rule out PE, d-dimer is negative. Patient ABG had shown what appears to be respiratory alkalosis.  Patient on my reassessment is noted to be slightly dyspneic.  She has required intubation in the past because of her asthma.  Dr. Jeanell Sparrow recommended the patient be admitted to the hospital for observation if the dimer is negative, and if the dimer is positive then get the CT scan.  Given that the d-dimer is negative we have called the hospitalist team to admit.  They requested a repeat ABG that has been ordered.   Varney Biles, MD 06/09/18 [phone]

## 2018-06-09 NOTE — Progress Notes (Signed)
Patient is a 43 year old female with past medical history of intermittent asthma, hypertension, anxiety, currently pregnant at [redacted] weeks of gestation who presented to [facility] with 4 days  history of dyspnea, cough, wheezing and chest tightness.  Admitted for asthma exacerbation. Patient seen and examined the bedside this morning.  Appears more comfortable.  Auscultation does reveal mild expiratory wheezes.  But she becomes short of breath easily when ambulation.  She was in sinus tachycardia in the range of 110s. We will continue current management.  Continue bronchodilators and steroids. Hopefully she can be discharged tomorrow to home with oral steroids. Patient seen by Dr. Marlowe Sax this morning.

## 2018-06-10 DIAGNOSIS — J45901 Unspecified asthma with (acute) exacerbation: Secondary | ICD-10-CM | POA: Diagnosis not present

## 2018-06-10 LAB — CBC
HCT: 33.2 % — ABNORMAL LOW (ref 36.0–46.0)
Hemoglobin: 10 g/dL — ABNORMAL LOW (ref 12.0–15.0)
MCH: 22.7 pg — ABNORMAL LOW (ref 26.0–34.0)
MCHC: 30.1 g/dL (ref 30.0–36.0)
MCV: 75.3 fL — ABNORMAL LOW (ref 80.0–100.0)
NRBC: 0 % (ref 0.0–0.2)
PLATELETS: 302 10*3/uL (ref 150–400)
RBC: 4.41 MIL/uL (ref 3.87–5.11)
RDW: 15.6 % — AB (ref 11.5–15.5)
WBC: 18 10*3/uL — ABNORMAL HIGH (ref 4.0–10.5)

## 2018-06-10 LAB — BASIC METABOLIC PANEL
Anion gap: 10 (ref 5–15)
BUN: 7 mg/dL (ref 6–20)
CO2: 21 mmol/L — ABNORMAL LOW (ref 22–32)
Calcium: 9.1 mg/dL (ref 8.9–10.3)
Chloride: 104 mmol/L (ref 98–111)
Creatinine, Ser: 0.69 mg/dL (ref 0.44–1.00)
GFR calc Af Amer: >60 mL/min (ref >60)
GFR calc non Af Amer: >60 mL/min (ref >60)
Glucose, Bld: 162 mg/dL — ABNORMAL HIGH (ref 70–99)
Potassium: 4.1 mmol/L (ref 3.5–5.1)
Sodium: 135 mmol/L (ref 135–145)

## 2018-06-10 MED ORDER — ALBUTEROL SULFATE HFA 108 (90 BASE) MCG/ACT IN AERS: 2.0000 | INHALATION_SPRAY | Freq: Four times a day (QID) | RESPIRATORY_TRACT | 2 refills | Status: DC | PRN

## 2018-06-10 MED ORDER — PREDNISONE 50 MG PO TABS: 50.0000 mg | ORAL_TABLET | Freq: Every day | ORAL | 0 refills | Status: DC

## 2018-06-10 NOTE — Progress Notes (Signed)
Pt seen by MD, orders written for d/c.  Went over discharge instructions with pt and answered all questions.  Removed IV's and telemetry, no complications.  New prescriptions sent to pharmacy, pt educated on medications.  Escorted for discharge via wheelchair with all belongings.  Will follow up outpatient with MD.

## 2018-06-10 NOTE — Discharge Summary (Signed)
Physician Discharge Summary  Jenna Stephens:[id number] DOB: May 27, 1975 DOA: 06/08/2018  PCP: Olga Millers, MD  Admit date: 06/08/2018 Discharge date: 06/10/2018  Admitted From: Home Disposition:  Home  Discharge Condition:Stable CODE STATUS:FULL Diet recommendation: Heart Healthy   Brief/Interim Summary: Patient is a 43 year old female with past medical history of intermittent asthma, hypertension, anxiety, currently pregnant at [redacted] weeks of gestation who presented to [facility] with 4 days  history of dyspnea, cough, wheezing and chest tightness.  Patient has history of intermittent asthma.  She was admitted for the management of asthma exacerbation. Patient was started on IV steroids, bronchodilators with significant improvement.  This morning her respiratory status was stable.  She was off oxygen and saturating fine on room air.  I did not auscultate any wheezes today.  She is stable for discharge home with oral steroids.  She needs to follow-up with her primary care physician/OB/GYN in a week.  Following problems were addressed during her hospitalization:  Acute asthma exacerbation Likely precipitated by recent viral URI.  Patient reports compliance with home inhalers. On presentation: Wheezing,achycardic and tachypneic.  pH 7.43, PCO2 25, PO2 87.  Respiratory alkalosis likely secondary to hyperventilation. Received DuoNeb treatments and Solu-Medrol 125 mg once in the ED. Chest x-ray negative for acute finding.   Respiratory status has improved. Not hypoxic.  Influenza panel negative.    She is hemodynamically stable. We will change steroids to oral.  She will continue prednisone 50 mg daily for 5 days at home.  Continue home inhalers.  I have added albuterol inhaler as needed for shortness of breath. She follows with pulmonology Dr. Vaughan Browner as an outpatient.  Mild leukocytosis Possibly reactive and secondary to steroids.  WBC bumped up today.  Follow-up CBC in a  week.Afebrile.  Chest x-ray negative for pneumonia.  UA not suggestive of infection.  Chest tightness Likely related to bronchospasm.  EKG not suggestive of ACS.  Patient appears comfortable on exam and in no distress.  Hypokalemia Supplemented.  GERD -Continue PPI  Pregnancy G6p2a3 at [redacted] weeks gestation.  She will need continued follow-up with OB/GYN. Continue home Phenergan for nausea  Hypertension Continue home labetalol and amlodipine  Chronic microcytic anemia Hemoglobin 10.7, stable.  Continue iron supplement.  Discharge Diagnoses:  Principal Problem:   Asthma exacerbation Active Problems:   Leukocytosis   Chest tightness   Hypokalemia   GERD (gastroesophageal reflux disease)    Discharge Instructions  Discharge Instructions    Diet - low sodium heart healthy   Complete by:  As directed    Discharge instructions   Complete by:  As directed    1)Follow up with your PCP in a week. 2)Take prescribed medications as instructed.   Increase activity slowly   Complete by:  As directed      Allergies as of 06/10/2018   No Known Allergies     Medication List    TAKE these medications   albuterol 108 (90 Base) MCG/ACT inhaler Commonly known as:  PROVENTIL HFA;VENTOLIN HFA Inhale 2 puffs into the lungs every 6 (six) hours as needed for wheezing or shortness of breath.   amLODipine 10 MG tablet Commonly known as:  NORVASC Take 1 tablet (10 mg total) by mouth daily.   ferrous sulfate 325 (65 FE) MG tablet Take 1-2 tablets (325-650 mg total) by mouth daily with breakfast.   fluticasone furoate-vilanterol 200-25 MCG/INH Aepb Commonly known as:  BREO ELLIPTA Inhale 1 puff into the lungs daily.   labetalol 200  MG tablet Commonly known as:  NORMODYNE Take 1 tablet by mouth 3 (three) times daily.   montelukast 10 MG tablet Commonly known as:  SINGULAIR Take 1 tablet (10 mg total) by mouth daily.   multivitamin with minerals Tabs tablet Take 1 tablet  by mouth daily.   pantoprazole 40 MG tablet Commonly known as:  PROTONIX Take 1 tablet (40 mg total) by mouth daily.   predniSONE 50 MG tablet Commonly known as:  DELTASONE Take 1 tablet (50 mg total) by mouth daily with breakfast. Start taking on:  June 11, 2018   promethazine 25 MG tablet Commonly known as:  PHENERGAN Take 1 tablet by mouth 2 (two) times daily.   umeclidinium bromide 62.5 MCG/INH Aepb Commonly known as:  INCRUSE ELLIPTA Inhale 1 puff into the lungs daily.   zolpidem 10 MG tablet Commonly known as:  AMBIEN Take 10 mg by mouth at bedtime as needed for sleep.      Follow-up Information    Olga Millers, MD. Schedule an appointment as soon as possible for a visit in 1 week(s).   Specialty:  Obstetrics and Gynecology Contact information: [place] [id number] [phone]          No Known Allergies  Consultations:  None   Procedures/Studies: Dg Chest 2 View  Result Date: 06/08/2018 CLINICAL DATA:  Fifteen weeks pregnant.  Congestion EXAM: CHEST - 2 VIEW COMPARISON:  None. FINDINGS: Normal mediastinum and cardiac silhouette. Normal pulmonary vasculature. No evidence of effusion, infiltrate, or pneumothorax. No acute bony abnormality. IMPRESSION: No acute cardiopulmonary process. Electronically Signed   By: Suzy Bouchard M.D.   On: 06/08/2018 17:32       Subjective: Patient seen and examined the bedside this morning.  Remains comfortable.  Hemodynamically stable.  Off oxygen and on room air.  Tachycardia has resolved.  Discharge Exam: Vitals:   06/09/18 2257 06/10/18 0813  BP: (!) 142/92 (!) 165/93  Pulse: (!) 102 92  Resp: 14 18  Temp: 98 F (36.7 C) 98.6 F (37 C)  SpO2: 99% 97%   Vitals:   06/09/18 1646 06/09/18 1917 06/09/18 2257 06/10/18 0813  BP: 134/74  (!) 142/92 (!) 165/93  Pulse: 98  (!) 102 92  Resp: 19  14 18   Temp: 98.1 F (36.7 C)  98 F (36.7 C) 98.6 F (37 C)  TempSrc:  Oral  Oral Oral  SpO2: 99% 98% 99% 97%  Weight:      Height:        General: Pt is alert, awake, not in acute distress Cardiovascular: RRR, S1/S2 +, no rubs, no gallops Respiratory: CTA bilaterally, no wheezing, no rhonchi Abdominal: Soft, NT, ND, bowel sounds + Extremities: no edema, no cyanosis    The results of significant diagnostics from this hospitalization (including imaging, microbiology, ancillary and laboratory) are listed below for reference.     Microbiology: No results found for this or any previous visit (from the past 240 hour(s)).   Labs: BNP (last 3 results) Recent Labs    12/29/17 0426  BNP 7.7   Basic Metabolic Panel: Recent Labs  Lab 06/08/18 2208 06/08/18 2305 06/09/18 0333 06/09/18 0436 06/10/18 0301  NA 132* 133* 133*  --  135  K 3.3* 3.7 3.5  --  4.1  CL 103  --   --   --  104  CO2 18*  --   --   --  21*  GLUCOSE 147*  --   --   --  162*  BUN 5*  --   --   --  7  CREATININE 0.64  --   --   --  0.69  CALCIUM 8.9  --   --   --  9.1  MG  --   --   --  1.8  --    Liver Function Tests: No results for input(s): AST, ALT, ALKPHOS, BILITOT, PROT, ALBUMIN in the last 168 hours. No results for input(s): LIPASE, AMYLASE in the last 168 hours. No results for input(s): AMMONIA in the last 168 hours. CBC: Recent Labs  Lab 06/08/18 1631 06/08/18 2305 06/09/18 0333 06/10/18 0301  WBC 12.2*  --   --  18.0*  NEUTROABS 8.9*  --   --   --   HGB 10.7* 12.6 11.2* 10.0*  HCT 34.3* 37.0 33.0* 33.2*  MCV 75.1*  --   --  75.3*  PLT 276  --   --  302   Cardiac Enzymes: No results for input(s): CKTOTAL, CKMB, CKMBINDEX, TROPONINI in the last 168 hours. BNP: Invalid input(s): POCBNP CBG: No results for input(s): GLUCAP in the last 168 hours. D-Dimer Recent Labs    06/09/18 0000  DDIMER <0.27   Hgb A1c No results for input(s): HGBA1C in the last 72 hours. Lipid Profile No results for input(s): CHOL, HDL, LDLCALC, TRIG, CHOLHDL, LDLDIRECT in the  last 72 hours. Thyroid function studies No results for input(s): TSH, T4TOTAL, T3FREE, THYROIDAB in the last 72 hours.  Invalid input(s): FREET3 Anemia work up No results for input(s): VITAMINB12, FOLATE, FERRITIN, TIBC, IRON, RETICCTPCT in the last 72 hours. Urinalysis    Component Value Date/Time   COLORURINE YELLOW 06/08/2018 1307   APPEARANCEUR CLEAR 06/08/2018 1307   LABSPEC 1.020 06/08/2018 1307   PHURINE 6.5 06/08/2018 1307   GLUCOSEU NEGATIVE 06/08/2018 [place] 06/08/2018 [place] 06/08/2018 [place] 06/08/2018 1307   PROTEINUR NEGATIVE 06/08/2018 1307   UROBILINOGEN 1.0 06/10/2014 1200   NITRITE NEGATIVE 06/08/2018 1307   LEUKOCYTESUR NEGATIVE 06/08/2018 1307   Sepsis Labs Invalid input(s): PROCALCITONIN,  WBC,  LACTICIDVEN Microbiology No results found for this or any previous visit (from the past 240 hour(s)).  Please note: You were cared for by a hospitalist during your hospital stay. Once you are discharged, your primary care physician will handle any further medical issues. Please note that NO REFILLS for any discharge medications will be authorized once you are discharged, as it is imperative that you return to your primary care physician (or establish a relationship with a primary care physician if you do not have one) for your post hospital discharge needs so that they can reassess your need for medications and monitor your lab values.    Time coordinating discharge: 40 minutes  SIGNED:   Shelly Coss, MD  [facility] 06/10/2018, 8:23 AM Pager [phone]  If 7PM-7AM, please contact night-coverage [email or web address] Password [email or web address]

## 2018-07-07 ENCOUNTER — Encounter: Payer: Self-pay | Admitting: Family Medicine

## 2018-07-07 ENCOUNTER — Other Ambulatory Visit: Payer: Self-pay

## 2018-07-07 ENCOUNTER — Ambulatory Visit: Payer: Self-pay | Admitting: Clinical

## 2018-07-07 ENCOUNTER — Ambulatory Visit (INDEPENDENT_AMBULATORY_CARE_PROVIDER_SITE_OTHER): Payer: Medicaid Other | Admitting: Family Medicine

## 2018-07-07 VITALS — BP 159/94 | HR 97 | Wt 285.5 lb

## 2018-07-07 DIAGNOSIS — O0990 Supervision of high risk pregnancy, unspecified, unspecified trimester: Secondary | ICD-10-CM

## 2018-07-07 DIAGNOSIS — O26892 Other specified pregnancy related conditions, second trimester: Secondary | ICD-10-CM

## 2018-07-07 DIAGNOSIS — K219 Gastro-esophageal reflux disease without esophagitis: Secondary | ICD-10-CM

## 2018-07-07 DIAGNOSIS — O10919 Unspecified pre-existing hypertension complicating pregnancy, unspecified trimester: Secondary | ICD-10-CM

## 2018-07-07 DIAGNOSIS — O09522 Supervision of elderly multigravida, second trimester: Secondary | ICD-10-CM

## 2018-07-07 DIAGNOSIS — O10912 Unspecified pre-existing hypertension complicating pregnancy, second trimester: Secondary | ICD-10-CM

## 2018-07-07 DIAGNOSIS — J4550 Severe persistent asthma, uncomplicated: Secondary | ICD-10-CM

## 2018-07-07 DIAGNOSIS — O09529 Supervision of elderly multigravida, unspecified trimester: Secondary | ICD-10-CM

## 2018-07-07 DIAGNOSIS — Z3A19 19 weeks gestation of pregnancy: Secondary | ICD-10-CM

## 2018-07-07 DIAGNOSIS — O119 Pre-existing hypertension with pre-eclampsia, unspecified trimester: Secondary | ICD-10-CM | POA: Insufficient documentation

## 2018-07-07 MED ORDER — NIFEDIPINE ER OSMOTIC RELEASE 30 MG PO TB24: 30.0000 mg | ORAL_TABLET | Freq: Every day | ORAL | 2 refills | Status: DC

## 2018-07-07 NOTE — Patient Instructions (Signed)
Places to have your son circumcised:    [facility] [phone] while you are in hospital  [facility] [phone] $244 by 4 wks  [facility] [phone] $175 by 2 wks  [facility] [phone] $250 by 7 days [facility] [phone] $[phone] by 4 wks  These prices sometimes change but are roughly what you can expect to pay. Please call and confirm pricing.   Circumcision is considered an elective/non-medically necessary procedure. There are many reasons parents decide to have their sons circumsized. During the first year of life circumcised males have a reduced risk of urinary tract infections but after this year the rates between circumcised males and uncircumcised males are the same.  It is safe to have your son circumcised outside of the hospital and the places above perform them regularly.   Deciding about Circumcision in Baby Boys  (Up-to-date The Basics)  What is circumcision?  Circumcision is a surgery that removes the skin that covers the tip of the penis, called the "foreskin" Circumcision is usually done when a boy is between 45 and 3 days old. In the [place], circumcision is common. In some other countries, fewer boys are circumcised. Circumcision is a common tradition in some religions.  Should I have my baby boy circumcised?  There is no easy answer. Circumcision has some benefits. But it also has risks. After talking with your doctor, you will have to decide for yourself what is right for your family.  What are the benefits of circumcision?  Circumcised boys seem to have slightly lower rates of: ?Urinary tract infections ?Swelling of the opening at the tip of the penis Circumcised men seem to have slightly lower rates of: ?Urinary tract infections ?Swelling of the opening at the tip of the penis ?Penis  cancer ?HIV and other infections that you catch during sex ?Cervical cancer in the women they have sex with Even so, in the [place], the risks of these problems are small - even in boys and men who have not been circumcised. Plus, boys and men who are not circumcised can reduce these extra risks by: ?Cleaning their penis well ?Using condoms during sex  What are the risks of circumcision?  Risks include: ?Bleeding or infection from the surgery ?Damage to or amputation of the penis ?A chance that the doctor will cut off too much or not enough of the foreskin ?A chance that sex won't feel as good later in life Only about 1 out of every 200 circumcisions leads to problems. There is also a chance that your health insurance won't pay for circumcision.  How is circumcision done in baby boys?  First, the baby gets medicine for pain relief. This might be a cream on the skin or a shot into the base of the penis. Next, the doctor cleans the baby's penis well. Then he or she uses special tools to cut off the foreskin. Finally, the doctor wraps a bandage (called gauze) around the baby's penis. If you have your baby circumcised, his doctor or nurse will give you instructions on how to care for him after the surgery. It is important that you follow those instructions carefully.  AREA PEDIATRIC/FAMILY PRACTICE PHYSICIANS  [place]outheast [place] [phone]) . [facility] Davy Pique, MD; Gwendlyn Deutscher, MD; Hemphill Kehr, MD; Andria Frames, MD; McDiarmid, MD; Dutch Quint, MD; Nori Riis, MD; [place], [address] [place], [place] [address] o [phone] o Mon-Fri 8:30-12:30, 1:30-5:00 o Providers come to see babies at [facility] o Accepting Medicaid . [facility] providers who accept newborns: Dorthy Cooler, MD; Orland Mustard, MD; Stephanie Acre, MD o [place], [place], [place] [address] o [phone] o Mon-Fri 8:00-5:30 o Babies seen by providers at  [facility] o Does NOT accept Medicaid o Please call early in hospitalization for appointment (limited availability)  . Mustard Holladay, MD o [address], [place], [place] [address] o [phone] o Mon, Tue, Thur, Fri 8:30-5:00, Wed 10:00-7:00 (closed 1-2pm) o Babies seen by [facility] providers o Accepting Medicaid . [place], MD o [place], [place], [place] [address] o [phone] o Mon-Fri 8:30-5:00, Sat 8:30-12:00 o Provider comes to see babies at [place] Medicaid o Must have been referred from current patients or contacted office prior to delivery . [facility] ([place] for [place]) Franne Forts, MD; Tamera Punt, MD; Doneen Poisson, MD; Fatima Sanger, MD; Wynetta Emery, MD; Jess Barters, MD; Tami Ribas, MD; Herbert Moors, MD; Derrell Lolling, MD; Dorothyann Peng, MD; Lucious Groves, NP; Baldo Ash, NP o [address], [place], [place] [address] o [phone] o Mon, Tue, Thur, Fri 8:30-5:30, Wed 9:30-5:30, Sat 8:30-12:30 o Babies seen by [facility] providers o Accepting Medicaid o Only accepting infants of first-time parents or siblings of current patients [facility] discharge coordinator will make follow-up appointment . Baltazar Najjar o [place] [address], [place], [place]  [address] o [phone]   Fax - [phone] . [facility] [place] o [address], [place], [place]  [address] o Phone - [phone]   Fax [phone] . [place], [place], [place], [place]  [address] o [phone]  [place] [phone]) . [facility] Reginal Lutes, MD; Jacklynn Ganong, MD; Torrie Mayers, MD; MD; Rosana Hoes, MD; Servando Salina, MD; Rose Fillers, MD; Rex Kras, MD; Corinna Capra, MD; Volney American, MD; Trilby Drummer, MD; Janann Colonel, MD; Jimmye Norman, [place], [place], [place] [address] o [phone] o Mon-Fri 8:30-5:00 (extended evenings Mon-Thur as needed), Sat-Sun  10:00-1:00 o Providers come to see babies at [place] Medicaid for families of first-time babies and families with all children in the household age 20 and under. Must register with office prior to making appointment (M-F only). . [place], NP; Tomi Bamberger, MD; Redmond School, MD; [place], [place] [facility] [place], [place] [address] o [phone] o Mon-Fri 8:00-5:00 o Babies seen by providers at [facility] o Does NOT accept Medicaid/Commercial Insurance Only . [facility] [place] [facility])  Marnee Guarneri, MD; Drema Dallas, MD; Montine Circle, MD; Vilma Prader, MD; Vanita Panda, MD; Alfonso Ramus, MD; Ruthann Cancer, MD; Roxanne Mins, MD; Rosalva Ferron, MD; Polly Cobia, MD o [address] [place], [place] [address] o [phone] o Mon-Fri 8:30-5:30, Sat (Oct.-Mar.) 9:00-1:00 o Babies seen by providers at [place] [phone]) . [facility] Peers, MD; Suzan Slick, MD o [address], [place],  [address] o [phone] o Mon-Fri 8:30-5:00, Sat 8:30-12:00 o Providers come to see babies at [facility] o Does NOT accept Medicaid . [place] at [place], [place]; Vand[place], MD; [place], [place]; Nancy Fetter, MD; Moreen Fowler, [place], [place],  [address] o [phone] o Mon-Fri 8:00-5:00 o Babies seen by providers at [facility] o Does NOT accept Medicaid o Only accepting babies of parents who are patients o Please call early in hospitalization for appointment (limited availability) .  [facility] Blanca Friend, MD; Sharlene Motts, MD; Rod Can, MD; Warner Mccreedy, NP; Sabra Heck, MD; Ermalinda Memos, MD; Sharlett Iles, NP; Aurther Loft, MD; Jerrye Beavers, MD; Marcello Moores, MD; Berline Lopes, MD; Charolette Forward, MD o [address] [place], [place], [place] [address] o [phone] o Mon-Fri 8:00-5:00, Sat 9:00-12:00 o Providers come to see babies at [facility] o Does NOT accept  [facility] [phone]) . [place] [facility] [place] providers accepting new patients: Dayna Ramus, NP; [place], [place], [place], [place] [address] o [phone] o Mon-Fri 8:00-5:00 o Babies seen by providers at [facility] o Does NOT accept Medicaid o Only accepting babies of parents who are patients o Please call early in hospitalization for appointment (limited availability) . [facility] Oswaldo Conroy, MD; Sheran Lawless, MD o [address]., [place], [place] [address] o [phone] (press 1 to schedule appointment) o Mon-Fri 8:00-5:00 o Providers come to see babies at [facility] o Does NOT accept Medicaid . [facility] Jodi Mourning, MD o [address], [place], [place] [address] o [phone] o Mon-Fri 8:30-5:00 (lunch 12:30-1:00), extended hours by appointment only Wed 5:00-6:30 o Babies seen by [facility] providers o Accepting Medicaid . [place] at Evalyn Casco, MD; [place], MD; Ethlyn Gallery, MD o [place], [place], [place] [address] o [phone] o Mon-Fri 8:00-5:00 o Babies seen by [facility] providers o Does NOT accept Medicaid . Therapist, music at [place], MD; Yong Channel, MD; [place], [place] [address] [place], [place] [address] o [phone] o Mon-Fri 8:00-5:00 o Babies seen by [facility] providers o Does NOT accept Medicaid . [place], [place]; [place], [place]; Elliott, NP; Albertina Parr, MD; Frederic Jericho, MD; Ronney Lion, MD; Carlos Levering, NP; Jerelene Redden, NP; Tomasita Crumble, NP; Ronelle Nigh, NP; Corinna Lines, MD; [place], MD o [place]., [place], [place] [address] o [phone] o Mon-Fri 8:30-5:00, Sat 10:00-1:00 o Providers come to see babies at [facility] o Does NOT accept Medicaid o Free prenatal information session Tuesdays at 4:45pm . [facility] Porfirio Oar, MD; [place], [place]; [place], [place]; [place], [place]., [place] [address] o [phone] o Mon-Fri 7:30-5:30 o Babies seen by [facility] providers . [facility] Children's Doctor o [address], [place], [place], [place]  [address] o [phone]   Fax - [phone]  [place] [phone] & [phone]) . [place], MD o [address] [place], [place] [address] o [phone] o Mon-Thur 8:00-6:00 o Providers come to see babies at [place] Medicaid . [place], NP; Melford Aase, MD; [place], [place]; [place], [place]., [place], [place] [address] o [phone] o Mon-Thur 7:30-7:30, Fri 7:30-4:30 o Babies seen by [facility] providers o Accepting Medicaid . [facility] Nyra Jabs, MD; Cristino Martes, NP; Gertie Baron, MD o [place] [address], [place], [place] [address] o [phone] o Mon-Fri 8:30-5:00, Sat 8:30-12:00 o Providers come to see babies at [place] Medicaid o Must have "Meet & Greet" appointment at office prior to delivery . [place] ([place]) Jodene Nam, MD; Juleen China, MD; Clydene Laming, [place], [place],  [address] o [phone] o Mon-Wed 8:00-6:00, Thur-Fri 8:00-5:00, Sat 9:00-12:00 o Providers come to see babies at [facility] o Does NOT accept Medicaid o Only accepting siblings of current patients . [facility] [place], [address],  [place], [place]  [address] o [phone]   Fax - [phone] . [place] at [place] [address] [place], [place]  [address] o [phone]   Fax - [place] [phone] & [phone]) . Therapist, music at [place], DO; [place], [address]., [place], [place] [address] o [phone] o Mon-Fri 7:00-5:00 o Babies seen by [facility] providers o Does NOT  accept Medicaid . [place], MD; [place], [place]; [place], [place], [place], [place] [address] o [phone] o Mon-Fri 8:00-5:00 o Babies seen by [facility] providers o Accepting Medicaid . [place], MD; [place], [place]; [place], NP; [place], [place] [address] [place], [place], [place] [address] o [phone] o Mon-Fri 8:00-5:00 o Babies seen by providers at [place] [phone]) . [facility] [place], [place] o [address] [place], [place] [address] o [phone] o Mon-Fri 8:00-5:00 o Babies seen by [facility] providers o Does NOT accept Medicaid o Limited availability, please call early in hospitalization to schedule follow-up . [facility] Leilani Merl, PA; Maisie Fus, MD; [place], MD; [place], [place]; Jeannine Kitten, MD; [place], [place] [facility] [address], [place], [place] [address] o [phone] o Mon-Fri 8:30-5:00, Sat 9:00-12:00 o Babies seen by providers at [facility] o Accepting Medicaid o Please register online then schedule online or call office o [email or web address] . [place] ([place] at [place]) Kristian Covey, NP; Dwyane Dee, MD; Leonidas Romberg, PA o [address] [place], [place], [place] [address] o [phone] o Mon-Fri 8:00-5:00 o Babies seen by providers at [facility] o Accepting Medicaid . [place] ([facility]) Dairl Ponder, MD; Rayvon Char, NP; Melina Modena, MD o [address] [place], [place], [place] [address] o [phone] o Mon-Fri 8:00-5:30, Sat&Sun by appointment (phones open at 8:30) o Babies seen by [facility] providers o Accepting Medicaid o Must be a first-time baby or sibling of current patient . [place], [address], [place], [place]  [address] o [phone]   Fax - [phone]  [place] [phone] & [phone]) . [place], [place]; [place], [place]; Benjamine Mola, MD; [place], [place]; Harrell Lark, MD o [address], [place], [place] [address] o [phone] o Mon-Thur 8:00-7:00, Fri 8:00-5:00, Sat 8:00-12:00, Sun 9:00-12:00 o Babies seen by [facility] providers o Accepting Medicaid . [facility] at [facility], MD; Ruthann Cancer, MD; [facility], MD o [address], [place],  [address] o [phone] o Mon-Thur 8:00-5:00 o Babies seen by providers at [facility] o Accepting Medicaid . [facility] at [place], MD; Coe-Goins, MD; Amedeo Plenty, MD; Bobby Rumpf, MD; List, MD; Lavonia Drafts, MD; Ruthann Cancer, MD; Selinda Eon, MD; Audie Box, MD; Jim Like, MD; Christie Nottingham, MD; Hubbard Hartshorn, MD; Modena Nunnery, MD o [place] [place], [place] [address] o [phone] o Mon-Fri 8:00-5:30, Sat (Oct.-Mar.) 9:00-1:00 o Babies seen by providers at [facility] o Accepting Medicaid o Must fill out new patient packet, available online at [email or web address] . [place] ([facility]) Barnabas Lister, NP; Kenton Kingfisher, NP; Claiborne Billings, NP; Rolla Plate, MD; [place], [place]; [place] Rhine, MD; Tyron Russell, MD;  Delia Chimes, NP o [address], [place], [place] [address] o [phone] o Mon-Thur 8:00-5:30, Fri 8:00-5:00 o Babies seen by providers at [place] [phone]) . [place], [place]; [place], MD; Dennard Schaumann, MD; [place], [place] o [address] [place], [place] [address] o [phone] o Mon-Fri 8:00-5:00 o Babies seen by providers at [place] [phone]) . [place] at [place], [place]; Olen Pel, MD; [place], [place], [place], [place]  [address] o [phone] o Mon-Fri 8:00-5:00 o Babies seen by providers at [facility] o Does NOT accept Medicaid o Limited appointment availability, please call early in hospitalization  . Therapist, music at [place], [place]; [place], [address] [place], [place] [address] o [phone] o Mon-Fri 8:00-5:00 o Babies seen by [facility] providers o Does NOT accept Medicaid . [facility] Su Grand, MD; Guy Sandifer, MD; [place], [place]; [place], [place] [address], [place], [place] [address] o [phone] o Mon-Fri 8:00-5:00 o After hours clinic [facility] [place], [place] [address]) [phone] Mon-Fri 5:00-8:00, Sat 12:00-6:00, Sun 10:00-4:00 o Babies seen by [facility] providers o Accepting Medicaid . [place] at [facility] o [address], [place], [place]  [address] o [phone]   Fax - [phone]  [place] [phone]) . Therapist, music at [facility], MD o [address] [place], [place], [place] [address] o [phone] o Mon-Fri 8:00-5:00 o Babies seen by [facility] providers o Does NOT accept Medicaid . [place] ([place] [facility] [place]) Bing Neighbors, MD o [address] [place], [place], [place] [address] o [phone] o Mon-Thur 8:00-7:00, Fri 8:00-5:00, Sat 8:00-12:00 o Babies seen by providers at [facility] o Accepting Medicaid - but does not have vaccinations in office (must be received elsewhere) o Limited availability, please call early in hospitalization  [place] ([id number]) . [place], [place] [address], [place] [address] o [phone]  Fax [phone]

## 2018-07-07 NOTE — Progress Notes (Signed)
Subjective:  Jenna Stephens is a [id number] [redacted]w[redacted]d being seen today for her first obstetrical visit. She was being followed by [facility], but was referred here due to being high risk.  Her obstetrical history is significant for advanced maternal age, obesity and chronic hypertension. She's currently on Labetalol 400mg  TID. Taking ASA 81mg . Low Risk panorama.. Patient does intend to breast feed. Pregnancy history fully reviewed.  Patient reports no complaints.  BP (!) 159/94   Pulse 97   Wt 285 lb 8 oz (129.5 kg)   LMP 02/22/2018   BMI 43.41 kg/m   HISTORY: OB History  Gravida Para Term Preterm AB Living  6 2 2  0 3 2  SAB TAB Ectopic Multiple Live Births  2 1 0   2    # Outcome Date GA Lbr Len/2nd Weight Sex Delivery Anes PTL Lv  6 Current           5 SAB 2019          4 TAB 2012          3 Term 2011     Vag-Spont   LIV  2 Term 2001     Vag-Spont   LIV  1 SAB             Past Medical History:  Diagnosis Date  . Anxiety   . Asthma   . Hypertension   . Iron deficiency anemia   . Obesity     Past Surgical History:  Procedure Laterality Date  . TONSILLECTOMY      Family History  Problem Relation Age of Onset  . Diabetes Mother   . Hypertension Mother      Exam  BP (!) 159/94   Pulse 97   Wt 285 lb 8 oz (129.5 kg)   LMP 02/22/2018   BMI 43.41 kg/m   CONSTITUTIONAL: Well-developed, well-nourished female in no acute distress.  HENT:  Normocephalic, atraumatic, External right and left ear normal. Oropharynx is clear and moist EYES: Conjunctivae and EOM are normal. Pupils are equal, round, and reactive to light. No scleral icterus.  NECK: Normal range of motion, supple, no masses.  Normal thyroid.  CARDIOVASCULAR: Normal heart rate noted, regular rhythm RESPIRATORY: Clear to auscultation bilaterally. Effort and breath sounds normal, no problems with respiration noted. BREASTS: Symmetric in size. No masses, skin changes, nipple drainage, or  lymphadenopathy. ABDOMEN: Soft, normal bowel sounds, no distention noted.  No tenderness, rebound or guarding.  PELVIC: Normal appearing external genitalia; normal appearing vaginal mucosa and cervix. No abnormal discharge noted. Normal uterine size, no other palpable masses, no uterine or adnexal tenderness. MUSCULOSKELETAL: Normal range of motion. No tenderness.  No cyanosis, clubbing, or edema.  2+ distal pulses. SKIN: Skin is warm and dry. No rash noted. Not diaphoretic. No erythema. No pallor. NEUROLOGIC: Alert and oriented to person, place, and time. Normal reflexes, muscle tone coordination. No cranial nerve deficit noted. PSYCHIATRIC: Normal mood and affect. Normal behavior. Normal judgment and thought content.    Assessment:    Pregnancy: [id number] Patient Active Problem List   Diagnosis Date Noted  . Supervision of high risk pregnancy, antepartum 07/07/2018  . AMA (advanced maternal age) multigravida 31+, unspecified trimester 07/07/2018  . Chronic hypertension in pregnancy 07/07/2018  . Asthma exacerbation 06/09/2018  . Leukocytosis 06/09/2018  . Chest tightness 06/09/2018  . Hypokalemia 06/09/2018  . GERD (gastroesophageal reflux disease) 06/09/2018  . Pregnant 04/22/2018  . Severe persistent asthma without complication 76/73/4193  . Dyspnea  01/27/2018  . Asthma with status asthmaticus   . Acute respiratory failure ([place]) 12/29/2017  . HTN (hypertension) 12/29/2017  . Obesity 12/29/2017      Plan:   1. Supervision of high risk pregnancy, antepartum FHT and FH normal - Comprehensive metabolic panel - Protein / creatinine ratio, urine - [place] MFM OB DETAIL +14 WK; Future  2. AMA (advanced maternal age) multigravida 65+, unspecified trimester Low risk panorama  3. Chronic hypertension in pregnancy Add procardia XR 30mg  CMP, UP:C Continue ASA 81mg  and labetalol 400mg  TID. [place] scheduled.  4. Severe persistent asthma without complication Has appt with  pulmonology  5. Gastroesophageal reflux disease without esophagitis Continue protonix     Problem list reviewed and updated. 75% of 30 min visit spent on counseling and coordination of care.     Truett Mainland 07/07/2018

## 2018-07-07 NOTE — BH Specialist Note (Signed)
Integrated Behavioral Health Initial Visit  MRN: [id number] Name: Jenna Stephens  Number of [place] Clinician visits:: 1/6 Session Start time: 3:26  Session End time: 3:40 Total time: 15 minutes  Type of Service: [place] Interpretor:No. Interpretor Name and Language: n/a   Warm Hand Off Completed.       SUBJECTIVE: Jenna Stephens is a 43 y.o. female accompanied by n/a Patient was referred by Loma Boston, DO for Initial OB introduction to integrated behavioral health services . Patient reports the following symptoms/concerns: Pt states was concerned about being sent to a new ob practice for high risk pregnancy(high blood pressure); is very open to receiving educational materials concerning managing hypertension today.  Duration of problem: Current pregnancy; Severity of problem: mild  OBJECTIVE: Mood: Normal and Affect: Appropriate Risk of harm to self or others: No plan to harm self or others  LIFE CONTEXT: Family and Social: Pt lives with her two children (94yo son; 9yo daughter) School/Work: Pt works fulltime from home Self-Care: - Life Changes: Current pregnancy  GOALS ADDRESSED: Patient will: 1. Increase knowledge and/or ability of: healthy habits  2. Demonstrate ability to: Increase healthy adjustment to current life circumstances  INTERVENTIONS: Interventions utilized: Psychoeducation and/or Health Education  Standardized Assessments completed: GAD-7 and PHQ 9  ASSESSMENT: Patient currently experiencing Supervision of high risk pregnancy, antepartum.   Patient may benefit from Initial OB introduction to integrated behavioral health services  PLAN: 1. Follow up with behavioral health clinician on : As needed 2. Behavioral recommendations:  -Continue taking prenatal vitamin daily, as recommended by medical provider -Read educational materials regarding coping with symptoms of  hypertension  3. Referral(s): [place] (In Clinic)  [place], [place]

## 2018-07-08 LAB — COMPREHENSIVE METABOLIC PANEL
ALK PHOS: 43 IU/L (ref 39–117)
ALT: 16 IU/L (ref 0–32)
AST: 18 IU/L (ref 0–40)
Albumin/Globulin Ratio: 1.8 (ref 1.2–2.2)
Albumin: 3.9 g/dL (ref 3.8–4.8)
BILIRUBIN TOTAL: 0.2 mg/dL (ref 0.0–1.2)
BUN/Creatinine Ratio: 11 (ref 9–23)
BUN: 7 mg/dL (ref 6–24)
CO2: 21 mmol/L (ref 20–29)
Calcium: 9.3 mg/dL (ref 8.7–10.2)
Chloride: 104 mmol/L (ref 96–106)
Creatinine, Ser: 0.62 mg/dL (ref 0.57–1.00)
GFR calc Af Amer: 130 mL/min/{1.73_m2} (ref >59)
GFR calc non Af Amer: 113 mL/min/{1.73_m2} (ref >59)
GLUCOSE: 103 mg/dL — AB (ref 65–99)
Globulin, Total: 2.2 g/dL (ref 1.5–4.5)
Potassium: 3.9 mmol/L (ref 3.5–5.2)
Sodium: 139 mmol/L (ref 134–144)
Total Protein: 6.1 g/dL (ref 6.0–8.5)

## 2018-07-08 LAB — PROTEIN / CREATININE RATIO, URINE
CREATININE, UR: 301.4 mg/dL
Protein, Ur: 19.7 mg/dL
Protein/Creat Ratio: 65 mg/g creat (ref 0–200)

## 2018-07-15 ENCOUNTER — Ambulatory Visit (HOSPITAL_COMMUNITY): Payer: Medicaid Other | Admitting: *Deleted

## 2018-07-15 ENCOUNTER — Other Ambulatory Visit: Payer: Self-pay

## 2018-07-15 ENCOUNTER — Ambulatory Visit (INDEPENDENT_AMBULATORY_CARE_PROVIDER_SITE_OTHER): Payer: Medicaid Other | Admitting: Pulmonary Disease

## 2018-07-15 ENCOUNTER — Ambulatory Visit (HOSPITAL_COMMUNITY)
Admission: RE | Admit: 2018-07-15 | Discharge: 2018-07-15 | Disposition: A | Discharge status: Home / Self Care | Payer: Medicaid Other | Source: Ambulatory Visit | Attending: Family Medicine | Admitting: Family Medicine

## 2018-07-15 ENCOUNTER — Encounter: Payer: Self-pay | Admitting: Pulmonary Disease

## 2018-07-15 VITALS — BP 128/62 | HR 96 | Ht 68.0 in | Wt 284.2 lb

## 2018-07-15 DIAGNOSIS — O09522 Supervision of elderly multigravida, second trimester: Secondary | ICD-10-CM

## 2018-07-15 DIAGNOSIS — O0990 Supervision of high risk pregnancy, unspecified, unspecified trimester: Secondary | ICD-10-CM

## 2018-07-15 DIAGNOSIS — O10012 Pre-existing essential hypertension complicating pregnancy, second trimester: Secondary | ICD-10-CM | POA: Diagnosis not present

## 2018-07-15 DIAGNOSIS — O3412 Maternal care for benign tumor of corpus uteri, second trimester: Secondary | ICD-10-CM | POA: Diagnosis not present

## 2018-07-15 DIAGNOSIS — O99212 Obesity complicating pregnancy, second trimester: Secondary | ICD-10-CM

## 2018-07-15 DIAGNOSIS — J45909 Unspecified asthma, uncomplicated: Secondary | ICD-10-CM

## 2018-07-15 DIAGNOSIS — J4540 Moderate persistent asthma, uncomplicated: Secondary | ICD-10-CM | POA: Diagnosis not present

## 2018-07-15 DIAGNOSIS — Z3A20 20 weeks gestation of pregnancy: Secondary | ICD-10-CM

## 2018-07-15 DIAGNOSIS — O9989 Other specified diseases and conditions complicating pregnancy, childbirth and the puerperium: Secondary | ICD-10-CM

## 2018-07-15 DIAGNOSIS — Z363 Encounter for antenatal screening for malformations: Secondary | ICD-10-CM

## 2018-07-15 NOTE — Progress Notes (Signed)
Jenna Stephens    [id number]    Dec 10, 1975  Primary Care Physician:Anderson, Marney Setting, MD  Referring Physician: Milford Cage, [place], [place] [address]  Chief complaint: Follow-up for asthma.  HPI: 43 year old with history of hypertension.  Admitted to [facility] hospital on 12/29/2017 with exacerbation and status asthmaticus in the setting of rhinovirus infection.  She required intubation, paralysis.  Discharged on 9/16  Follow-up after hospitalization in the pulmonary clinic.  Started on Breo, Singulair and nebs Seen in the ED on 10/9 with nausea, vomiting and intermittent diarrhea.   Pets: Has 2 [place] pigs, no cats, dogs, birds or farm animals Occupation: Works in Barista support for [employer]: Reports that bathroom has a leak however no known mold, no hot tub, Jacuzzi Smoking history: Smokes 2 cigarettes a day, smokes marijuana, no vaping Travel history: No significant travel history Relevant family history: Mother has asthma  Interim history: Continues on [place] States that breathing is doing well  She was admitted for a day in the middle of February for minor exacerbation of asthma.  Outpatient Encounter Medications as of 07/15/2018  Medication Sig  . albuterol (PROVENTIL HFA;VENTOLIN HFA) 108 (90 Base) MCG/ACT inhaler Inhale 2 puffs into the lungs every 6 (six) hours as needed for wheezing or shortness of breath.  Marland Kitchen aspirin 81 MG chewable tablet Chew by mouth daily.  . ferrous sulfate 325 (65 FE) MG tablet Take 1-2 tablets (325-650 mg total) by mouth daily with breakfast.  . fluticasone furoate-vilanterol (BREO ELLIPTA) 200-25 MCG/INH AEPB Inhale 1 puff into the lungs daily.  Marland Kitchen labetalol (NORMODYNE) 200 MG tablet Take 2 tablets by mouth 3 (three) times daily.  . montelukast (SINGULAIR) 10 MG tablet Take 1 tablet (10 mg total) by mouth daily.  Marland Kitchen NIFEdipine (PROCARDIA-XL/NIFEDICAL-XL) 30 MG 24 hr tablet Take 1  tablet (30 mg total) by mouth daily.  . pantoprazole (PROTONIX) 40 MG tablet Take 1 tablet (40 mg total) by mouth daily.  . Prenatal Vit-Fe Fumarate-FA (PRENATAL MULTIVITAMIN) TABS tablet Take 1 tablet by mouth daily at 12 noon.  . promethazine (PHENERGAN) 25 MG tablet Take 1 tablet by mouth 2 (two) times daily.  Marland Kitchen umeclidinium bromide (INCRUSE ELLIPTA) 62.5 MCG/INH AEPB Inhale 1 puff into the lungs daily.  . [DISCONTINUED] Multiple Vitamin (MULTIVITAMIN WITH MINERALS) TABS tablet Take 1 tablet by mouth daily.  Marland Kitchen zolpidem (AMBIEN) 10 MG tablet Take 10 mg by mouth at bedtime as needed for sleep.   No facility-administered encounter medications on file as of 07/15/2018.    Physical Exam: Blood pressure 128/62, pulse 96, height 5\' 8"  (1.727 m), weight 284 lb 3.2 oz (128.9 kg), last menstrual period 02/22/2018, SpO2 97 %, unknown if currently breastfeeding. Gen:      No acute distress HEENT:  EOMI, sclera anicteric Neck:     No masses; no thyromegaly Lungs:    Clear to auscultation bilaterally; normal respiratory effort CV:         Regular rate and rhythm; no murmurs Abd:      + bowel sounds; soft, non-tender; no palpable masses, no distension Ext:    No edema; adequate peripheral perfusion Skin:      Warm and dry; no rash Neuro: alert and oriented x 3 Psych: normal mood and affect  Data Reviewed: Imaging: Chest x-ray 9/8-no acute abnormality CTA 9/8- no pulmonary embolism in the main pulmonary arteries, mild nodular patchy opacities in the left  apex.  I have reviewed the images personally.  PFTs: 01/30/2018 FVC 3.22 [101%), FEV1 2.87 [110%), F/F 89, TLC 93%, DLCO 100% Normal test  FENO 01/09/18-5 FENO 03/01/2018-11.  Labs: CBC 12/29/2017-WBC 19.7, eos 9%, absolute eosinophil count 1773 Blood allergy profile 12/30/2017-IgE 214, RAST panel shows sensitivity to dust mite, cat, dog, tree, grass pollen, mouse urine  Assessment:  Moderate persistent asthma, allergies We will continue Breo,  incruse Restart Singulair as she was out of medication  Monitor symptoms.  If she does not improve then we may consider initiation of Biologics.  Obesity Discussed that weight loss will lead to significant improvements in her breathing Advised diet and exercise with goal target of reducing 20 to 30 pounds.  GERD Continue Protonix.   On iron supplementation for iron deficiency anemia.    Plan/Recommendations: - Continue Breo, Incruse, singulair - Continue Protonix, iron supplementation  Marshell Garfinkel MD [facility] 07/15/2018, 4:07 PM  CC: Milford Cage, PA

## 2018-07-15 NOTE — Patient Instructions (Signed)
I am glad you are doing well with breathing Continue the inhalers as prescribed.  Follow up in 3 months

## 2018-07-16 ENCOUNTER — Other Ambulatory Visit (HOSPITAL_COMMUNITY): Payer: Self-pay | Admitting: *Deleted

## 2018-07-16 DIAGNOSIS — O09522 Supervision of elderly multigravida, second trimester: Secondary | ICD-10-CM

## 2018-07-23 ENCOUNTER — Telehealth: Payer: Self-pay | Admitting: Obstetrics and Gynecology

## 2018-07-23 NOTE — Telephone Encounter (Signed)
Called the patient to inform of the COVID19 restrictions. Informed of the rescheduled appointment. The patient also stated she has access to blood pressure cuffs. Sending an appointment reminder.

## 2018-07-24 ENCOUNTER — Encounter: Payer: Self-pay | Admitting: Family Medicine

## 2018-08-13 ENCOUNTER — Ambulatory Visit (INDEPENDENT_AMBULATORY_CARE_PROVIDER_SITE_OTHER): Payer: Medicaid Other | Admitting: Obstetrics and Gynecology

## 2018-08-13 ENCOUNTER — Telehealth: Payer: Self-pay | Admitting: Obstetrics and Gynecology

## 2018-08-13 ENCOUNTER — Other Ambulatory Visit: Payer: Self-pay

## 2018-08-13 VITALS — BP 125/84

## 2018-08-13 DIAGNOSIS — O10912 Unspecified pre-existing hypertension complicating pregnancy, second trimester: Secondary | ICD-10-CM | POA: Diagnosis not present

## 2018-08-13 DIAGNOSIS — J4550 Severe persistent asthma, uncomplicated: Secondary | ICD-10-CM

## 2018-08-13 DIAGNOSIS — O09522 Supervision of elderly multigravida, second trimester: Secondary | ICD-10-CM | POA: Diagnosis not present

## 2018-08-13 DIAGNOSIS — K219 Gastro-esophageal reflux disease without esophagitis: Secondary | ICD-10-CM | POA: Diagnosis not present

## 2018-08-13 DIAGNOSIS — Z3A24 24 weeks gestation of pregnancy: Secondary | ICD-10-CM

## 2018-08-13 DIAGNOSIS — O0990 Supervision of high risk pregnancy, unspecified, unspecified trimester: Secondary | ICD-10-CM

## 2018-08-13 DIAGNOSIS — O09529 Supervision of elderly multigravida, unspecified trimester: Secondary | ICD-10-CM

## 2018-08-13 DIAGNOSIS — O10919 Unspecified pre-existing hypertension complicating pregnancy, unspecified trimester: Secondary | ICD-10-CM

## 2018-08-13 MED ORDER — LANSOPRAZOLE 30 MG PO CPDR: 30.0000 mg | DELAYED_RELEASE_CAPSULE | Freq: Every day | ORAL | 5 refills | Status: DC

## 2018-08-13 NOTE — Telephone Encounter (Signed)
Called the patient to inform of the virtual visit as well as the next scheduled visit for the face to face. Informed the patient an CMA or nurse will call her this afternoon with a code to complete the virtual visit.

## 2018-08-13 NOTE — Progress Notes (Addendum)
TELEHEALTH VIRTUAL OBSTETRICS VISIT ENCOUNTER NOTE  I connected with Jenna Stephens on 08/13/18 at  2:15 PM EDT by telephone at home and verified that I am speaking with the correct person using two identifiers.   I discussed the limitations, risks, security and privacy concerns of performing an evaluation and management service by telephone and the availability of in person appointments. I also discussed with the patient that there may be a patient responsible charge related to this service. The patient expressed understanding and agreed to proceed.  Subjective:  Jenna Stephens is a 43 y.o. Q7H4193 at [redacted]w[redacted]d being followed for ongoing prenatal care.  She is currently monitored for the following issues for this high-risk pregnancy and has Acute respiratory failure ([place]); HTN (hypertension); Obesity; Asthma with status asthmaticus; Dyspnea; Severe persistent asthma without complication; Asthma exacerbation; Leukocytosis; Chest tightness; Hypokalemia; GERD (gastroesophageal reflux disease); Supervision of high risk pregnancy, antepartum; AMA (advanced maternal age) multigravida 35+, unspecified trimester; and Chronic hypertension in pregnancy on their problem list.  Patient reports worsened GERD. Reports fetal movement. Denies any contractions, bleeding or leaking of fluid.   The following portions of the patient's history were reviewed and updated as appropriate: allergies, current medications, past family history, past medical history, past social history, past surgical history and problem list.   Objective:   General:  Alert, oriented and cooperative.   Mental Status: Normal mood and affect perceived. Normal judgment and thought content.  Rest of physical exam deferred due to type of encounter  Assessment and Plan:  Pregnancy: [id number] at [redacted]w[redacted]d  labetal 400 tid  singulair breo qday incruse bid Albuterol bid  inbasket message  gerd inbasket  1. Chronic hypertension in pregnancy  Pt confirms on labetalol 400 tid and the procardia xl 30 qday with low dose asa. Continue with current regimen. Has growth u/s on 4/28  2. Severe persistent asthma without complication Using singulair, breo qday and the incruse bid. She states she is using the albuterol bid. Patient followed by Rafael Bihari. I told her I will inbasket her MD there to see about titrating her meds to get her off the albuterol so frequently.   3. Gastroesophageal reflux disease without esophagitis Will trying switching to prevacid  4. Supervision of high risk pregnancy, antepartum Routine care. Has 28wk lab appt already scheduled  5. AMA (advanced maternal age) multigravida 32+, unspecified trimester No current issues.   Preterm labor symptoms and general obstetric precautions including but not limited to vaginal bleeding, contractions, leaking of fluid and fetal movement were reviewed in detail with the patient.  I discussed the assessment and treatment plan with the patient. The patient was provided an opportunity to ask questions and all were answered. The patient agreed with the plan and demonstrated an understanding of the instructions. The patient was advised to call back or seek an in-person office evaluation/go to [facility]U at [facility] for any urgent or concerning symptoms. Please refer to After Visit Summary for other counseling recommendations.   I provided 15 minutes of non-face-to-face time during this encounter. This appointment was done via [facility] [facility]  No follow-ups on file.  Future Appointments  Date Time Provider [place]  08/19/2018  1:00 PM WH-MFC [place] 3 WH-MFCUS MFC-US  08/19/2018  1:10 PM [place] NURSE [place] MFC-US  09/04/2018  8:20 AM WOC-WOCA LAB WOC-WOCA WOC  09/04/2018  9:15 AM Constant, Vickii Chafe, MD [place], MD Center for [employer], [facility]

## 2018-08-14 NOTE — ED Provider Notes (Signed)
Addendum to visit 06/08/2018 CRITICAL CARE Performed by: Pattricia Boss Total critical care time: 30 minutes Critical care time was exclusive of separately billable procedures and treating other patients. Critical care was necessary to treat or prevent imminent or life-threatening deterioration. Critical care was time spent personally by me on the following activities: development of treatment plan with patient and/or surrogate as well as nursing, discussions with consultants, evaluation of patient's response to treatment, examination of patient, obtaining history from patient or surrogate, ordering and performing treatments and interventions, ordering and review of laboratory studies, ordering and review of radiographic studies, pulse oximetry and re-evaluation of patient's condition.    Pattricia Boss, MD 08/14/18 1239

## 2018-08-19 ENCOUNTER — Encounter (HOSPITAL_COMMUNITY): Payer: Self-pay

## 2018-08-19 ENCOUNTER — Other Ambulatory Visit: Payer: Self-pay

## 2018-08-19 ENCOUNTER — Ambulatory Visit (HOSPITAL_COMMUNITY)
Admission: RE | Admit: 2018-08-19 | Discharge: 2018-08-19 | Disposition: A | Discharge status: Home / Self Care | Payer: Medicaid Other | Source: Ambulatory Visit | Attending: Obstetrics and Gynecology | Admitting: Obstetrics and Gynecology

## 2018-08-19 ENCOUNTER — Ambulatory Visit (HOSPITAL_COMMUNITY): Payer: Medicaid Other | Admitting: *Deleted

## 2018-08-19 VITALS — BP 144/86 | HR 86 | Temp 97.9°F

## 2018-08-19 DIAGNOSIS — Z362 Encounter for other antenatal screening follow-up: Secondary | ICD-10-CM

## 2018-08-19 DIAGNOSIS — O09522 Supervision of elderly multigravida, second trimester: Secondary | ICD-10-CM | POA: Diagnosis not present

## 2018-08-19 DIAGNOSIS — O09529 Supervision of elderly multigravida, unspecified trimester: Secondary | ICD-10-CM | POA: Diagnosis present

## 2018-08-19 DIAGNOSIS — O0990 Supervision of high risk pregnancy, unspecified, unspecified trimester: Secondary | ICD-10-CM

## 2018-08-20 ENCOUNTER — Other Ambulatory Visit (HOSPITAL_COMMUNITY): Payer: Self-pay | Admitting: *Deleted

## 2018-08-20 DIAGNOSIS — O09523 Supervision of elderly multigravida, third trimester: Secondary | ICD-10-CM

## 2018-08-25 ENCOUNTER — Encounter (HOSPITAL_COMMUNITY): Payer: Self-pay | Admitting: Obstetrics and Gynecology

## 2018-08-29 ENCOUNTER — Encounter: Payer: Self-pay | Admitting: *Deleted

## 2018-09-04 ENCOUNTER — Other Ambulatory Visit: Payer: Medicaid Other

## 2018-09-04 ENCOUNTER — Ambulatory Visit (INDEPENDENT_AMBULATORY_CARE_PROVIDER_SITE_OTHER): Payer: Medicaid Other | Admitting: Obstetrics and Gynecology

## 2018-09-04 ENCOUNTER — Other Ambulatory Visit: Payer: Self-pay

## 2018-09-04 ENCOUNTER — Encounter: Payer: Self-pay | Admitting: Obstetrics and Gynecology

## 2018-09-04 VITALS — BP 142/105 | HR 112 | Wt 274.8 lb

## 2018-09-04 DIAGNOSIS — O0992 Supervision of high risk pregnancy, unspecified, second trimester: Secondary | ICD-10-CM

## 2018-09-04 DIAGNOSIS — O10912 Unspecified pre-existing hypertension complicating pregnancy, second trimester: Secondary | ICD-10-CM | POA: Diagnosis not present

## 2018-09-04 DIAGNOSIS — O09529 Supervision of elderly multigravida, unspecified trimester: Secondary | ICD-10-CM

## 2018-09-04 DIAGNOSIS — Z3A27 27 weeks gestation of pregnancy: Secondary | ICD-10-CM

## 2018-09-04 DIAGNOSIS — O24419 Gestational diabetes mellitus in pregnancy, unspecified control: Secondary | ICD-10-CM

## 2018-09-04 DIAGNOSIS — Z23 Encounter for immunization: Secondary | ICD-10-CM | POA: Diagnosis not present

## 2018-09-04 DIAGNOSIS — O10919 Unspecified pre-existing hypertension complicating pregnancy, unspecified trimester: Secondary | ICD-10-CM

## 2018-09-04 DIAGNOSIS — O0990 Supervision of high risk pregnancy, unspecified, unspecified trimester: Secondary | ICD-10-CM

## 2018-09-04 DIAGNOSIS — O09522 Supervision of elderly multigravida, second trimester: Secondary | ICD-10-CM | POA: Diagnosis not present

## 2018-09-04 LAB — POCT URINALYSIS DIP (DEVICE)
Bilirubin Urine: NEGATIVE
Glucose, UA: NEGATIVE mg/dL
Hgb urine dipstick: NEGATIVE
Ketones, ur: NEGATIVE mg/dL
Leukocytes,Ua: NEGATIVE
Nitrite: NEGATIVE
Protein, ur: NEGATIVE mg/dL
Specific Gravity, Urine: >=1.03 (ref 1.005–1.030)
Urobilinogen, UA: 1 mg/dL (ref 0.0–1.0)
pH: 6 (ref 5.0–8.0)

## 2018-09-04 NOTE — Progress Notes (Signed)
   PRENATAL VISIT NOTE  Subjective:  Jenna Stephens is a 42 y.o. [id number] at [redacted]w[redacted]d being seen today for ongoing prenatal care.  She is currently monitored for the following issues for this high-risk pregnancy and has Acute respiratory failure ([place]); HTN (hypertension); Obesity; Asthma with status asthmaticus; Dyspnea; Severe persistent asthma without complication; Asthma exacerbation; Leukocytosis; Chest tightness; Hypokalemia; GERD (gastroesophageal reflux disease); Supervision of high risk pregnancy, antepartum; AMA (advanced maternal age) multigravida 35+, unspecified trimester; and Chronic hypertension in pregnancy on their problem list.  Patient reports carpal tunnel symptoms.  Contractions: Not present. Vag. Bleeding: None.  Movement: Present. Denies leaking of fluid.   The following portions of the patient's history were reviewed and updated as appropriate: allergies, current medications, past family history, past medical history, past social history, past surgical history and problem list.   Objective:   Vitals:   09/04/18 0846  BP: (!) 142/105  Pulse: (!) 112  Weight: 274 lb 12.8 oz (124.6 kg)    Fetal Status: Fetal Heart Rate (bpm): 141 Fundal Height: 29 cm Movement: Present     General:  Alert, oriented and cooperative. Patient is in no acute distress.  Skin: Skin is warm and dry. No rash noted.   Cardiovascular: Normal heart rate noted  Respiratory: Normal respiratory effort, no problems with respiration noted  Abdomen: Soft, gravid, appropriate for gestational age.  Pain/Pressure: Present     Pelvic: Cervical exam deferred        Extremities: Normal range of motion.  Edema: Trace  Mental Status: Normal mood and affect. Normal behavior. Normal judgment and thought content.   Assessment and Plan:  Pregnancy: G9M2111 at [redacted]w[redacted]d 1. Supervision of high risk pregnancy, antepartum Patient is doing well  Patient types all day and reports bilateral hand tingling. Advised patient  to wear bilateral wrist brace Patient desires BTL- papers signed today.  Third trimester labs and tap today  2. Chronic hypertension in pregnancy Patient did not take her BP meds this morning Continue labetalol and procardia. Continue ASA daily Follow up growth ultrasound 5/27  3. AMA (advanced maternal age) multigravida 50+, unspecified trimester   Preterm labor symptoms and general obstetric precautions including but not limited to vaginal bleeding, contractions, leaking of fluid and fetal movement were reviewed in detail with the patient. Please refer to After Visit Summary for other counseling recommendations.   Return in about 2 weeks (around 09/18/2018) for [facility], [place].  Future Appointments  Date Time Provider [place]  09/04/2018  9:15 AM Axle Parfait, Vickii Chafe, MD [place] WOC  09/17/2018  9:45 AM WH-MFC NURSE WH-MFC MFC-US  09/17/2018  9:45 AM WH-MFC [place] 2 WH-MFCUS MFC-US    Mora Bellman, MD

## 2018-09-04 NOTE — Addendum Note (Signed)
Addended by: Fidela Juneau A on: 09/04/2018 09:32 AM   Modules accepted: Orders

## 2018-09-05 ENCOUNTER — Telehealth (INDEPENDENT_AMBULATORY_CARE_PROVIDER_SITE_OTHER): Payer: Medicaid Other | Admitting: *Deleted

## 2018-09-05 DIAGNOSIS — O24419 Gestational diabetes mellitus in pregnancy, unspecified control: Secondary | ICD-10-CM | POA: Insufficient documentation

## 2018-09-05 DIAGNOSIS — O0992 Supervision of high risk pregnancy, unspecified, second trimester: Secondary | ICD-10-CM

## 2018-09-05 DIAGNOSIS — O0990 Supervision of high risk pregnancy, unspecified, unspecified trimester: Secondary | ICD-10-CM

## 2018-09-05 LAB — CBC
Hematocrit: 32.9 % — ABNORMAL LOW (ref 34.0–46.6)
Hemoglobin: 10.9 g/dL — ABNORMAL LOW (ref 11.1–15.9)
MCH: 26 pg — ABNORMAL LOW (ref 26.6–33.0)
MCHC: 33.1 g/dL (ref 31.5–35.7)
MCV: 79 fL (ref 79–97)
Platelets: 241 10*3/uL (ref 150–450)
RBC: 4.19 x10E6/uL (ref 3.77–5.28)
RDW: 12.7 % (ref 11.7–15.4)
WBC: 13.3 10*3/uL — ABNORMAL HIGH (ref 3.4–10.8)

## 2018-09-05 LAB — GLUCOSE TOLERANCE, 2 HOURS W/ 1HR
Glucose, 1 hour: 197 mg/dL — ABNORMAL HIGH (ref 65–179)
Glucose, 2 hour: 107 mg/dL (ref 65–152)
Glucose, Fasting: 92 mg/dL — ABNORMAL HIGH (ref 65–91)

## 2018-09-05 LAB — RPR: RPR Ser Ql: NONREACTIVE

## 2018-09-05 LAB — HIV ANTIBODY (ROUTINE TESTING W REFLEX): HIV Screen 4th Generation wRfx: NONREACTIVE

## 2018-09-05 MED ORDER — GLUCOSE BLOOD VI STRP: ORAL_STRIP | 12 refills | Status: DC

## 2018-09-05 MED ORDER — ACCU-CHEK NANO SMARTVIEW W/DEVICE KIT: 1.0000 | PACK | 0 refills | Status: DC

## 2018-09-05 MED ORDER — ACCU-CHEK FASTCLIX LANCETS MISC: 1.0000 [IU] | Freq: Four times a day (QID) | 12 refills | Status: DC

## 2018-09-05 NOTE — Telephone Encounter (Signed)
Called pt to inform her that she failed the 2hr gtt and would need to begin checking her blood sugars d/t the gestational diabetes.  Advised pt that the testing supplies were called into the [facility] on [address].  Pt verbalized understanding.  Order placed for Babyscripts Optimization.  Pt instructed on how to download and sign up for babyscripts.  Pt verbalized understanding.  Requested pt take BP and log it into the app along with a weight weekly.  Pt verbalized understanding.  Pt states she has a BP cuff at home.  Advised pt that once she signed up for the app, I would be able to change it so that she can enter her blood sugars as well.  Pt verbalized understanding and asked if she needed to take any medications for her blood sugar.  Advised pt that we are just monitoring for right now but would be scheduling her for an appointment with our Diabetic Educator.  Pt verbalized understanding.  Will send message to front office staff to schedule.

## 2018-09-05 NOTE — Telephone Encounter (Signed)
-----   Message from Mora Bellman, MD sent at 09/05/2018  9:49 AM EDT ----- Inform patient of failed glucola. Testing supplies and referrals in EPIC

## 2018-09-07 ENCOUNTER — Telehealth: Payer: Self-pay | Admitting: Obstetrics and Gynecology

## 2018-09-07 ENCOUNTER — Encounter: Payer: Self-pay | Admitting: Obstetrics and Gynecology

## 2018-09-07 NOTE — Telephone Encounter (Signed)
Called patient to ask her to come to MAU given severe elevated BP earlier in day after receiving call from Babyscripts. No answer, left message for her to call after hours RN.    Feliz Beam, M.D. Attending Center for [employer] Fish farm manager)

## 2018-09-07 NOTE — Progress Notes (Signed)
Received call from babyscripts that patient entered BP of 185/109, called patient twice to have her come to MAU, no answer, left message for her to call after hours line. Message sent to staff to contact patient early this week for BP check.    Feliz Beam, M.D. Attending Center for [employer] Fish farm manager)

## 2018-09-07 NOTE — Telephone Encounter (Signed)
Received call from babyscripts for elevated BP of 185/109. Called patient to have her come to MAU, no answer, left message asking her to call after hours line and ask to speak with me.   Feliz Beam, M.D. Attending Center for [employer] Fish farm manager)

## 2018-09-08 ENCOUNTER — Telehealth: Payer: Self-pay | Admitting: Obstetrics & Gynecology

## 2018-09-08 ENCOUNTER — Encounter: Payer: Self-pay | Admitting: *Deleted

## 2018-09-08 ENCOUNTER — Telehealth: Payer: Self-pay | Admitting: Family Medicine

## 2018-09-08 ENCOUNTER — Other Ambulatory Visit: Payer: Self-pay | Admitting: *Deleted

## 2018-09-08 NOTE — Telephone Encounter (Signed)
Attempted to call patient to inform her of the appointment scheduled with [facility].

## 2018-09-08 NOTE — Progress Notes (Signed)
Opened in error

## 2018-09-08 NOTE — Progress Notes (Signed)
Pt was able to register for babyscripts.  Platform changed in babyscripts so that the pt could enter blood sugars as well as blood pressures and weight.

## 2018-09-08 NOTE — Telephone Encounter (Signed)
Called patient given elevated blood pressure yesterday recorded in baby scripts. She states it was right before taking her medicine. She rechecked her BP after taking her medicine, and it was 137/83. This morning her BP was 138.82. She denies any headaches, vision changes, shortness of breath or abdominal pain. Has follow-up prenatal appointment tomorrow. Counseled on signs/symptoms of preeclampsia and instructed to call after-hours nurse and/or report to MAU if high blood pressures, symptoms. She voiced understanding of plan.  Lambert Mody. Juleen China, DO OB/GYN Fellow

## 2018-09-09 ENCOUNTER — Encounter (HOSPITAL_COMMUNITY): Payer: Self-pay

## 2018-09-09 ENCOUNTER — Ambulatory Visit: Payer: Medicaid Other | Admitting: *Deleted

## 2018-09-09 ENCOUNTER — Inpatient Hospital Stay (HOSPITAL_COMMUNITY)
Admission: AD | Admit: 2018-09-09 | Discharge: 2018-09-09 | Disposition: A | Discharge status: Home / Self Care | Payer: Medicaid Other | Attending: Family Medicine | Admitting: Family Medicine

## 2018-09-09 ENCOUNTER — Telehealth: Payer: Self-pay

## 2018-09-09 ENCOUNTER — Other Ambulatory Visit: Payer: Self-pay

## 2018-09-09 ENCOUNTER — Encounter: Payer: Medicaid Other | Attending: Obstetrics & Gynecology | Admitting: *Deleted

## 2018-09-09 DIAGNOSIS — O24419 Gestational diabetes mellitus in pregnancy, unspecified control: Secondary | ICD-10-CM | POA: Diagnosis not present

## 2018-09-09 DIAGNOSIS — Z3A28 28 weeks gestation of pregnancy: Secondary | ICD-10-CM | POA: Insufficient documentation

## 2018-09-09 DIAGNOSIS — O99513 Diseases of the respiratory system complicating pregnancy, third trimester: Secondary | ICD-10-CM | POA: Insufficient documentation

## 2018-09-09 DIAGNOSIS — Z713 Dietary counseling and surveillance: Secondary | ICD-10-CM | POA: Diagnosis present

## 2018-09-09 DIAGNOSIS — O99213 Obesity complicating pregnancy, third trimester: Secondary | ICD-10-CM | POA: Insufficient documentation

## 2018-09-09 DIAGNOSIS — Z8249 Family history of ischemic heart disease and other diseases of the circulatory system: Secondary | ICD-10-CM | POA: Insufficient documentation

## 2018-09-09 DIAGNOSIS — Z833 Family history of diabetes mellitus: Secondary | ICD-10-CM | POA: Diagnosis not present

## 2018-09-09 DIAGNOSIS — D509 Iron deficiency anemia, unspecified: Secondary | ICD-10-CM | POA: Diagnosis not present

## 2018-09-09 DIAGNOSIS — R51 Headache: Secondary | ICD-10-CM | POA: Diagnosis not present

## 2018-09-09 DIAGNOSIS — O10913 Unspecified pre-existing hypertension complicating pregnancy, third trimester: Secondary | ICD-10-CM | POA: Diagnosis present

## 2018-09-09 DIAGNOSIS — Z7982 Long term (current) use of aspirin: Secondary | ICD-10-CM | POA: Diagnosis not present

## 2018-09-09 DIAGNOSIS — E669 Obesity, unspecified: Secondary | ICD-10-CM | POA: Insufficient documentation

## 2018-09-09 DIAGNOSIS — Z79899 Other long term (current) drug therapy: Secondary | ICD-10-CM | POA: Insufficient documentation

## 2018-09-09 DIAGNOSIS — O10919 Unspecified pre-existing hypertension complicating pregnancy, unspecified trimester: Secondary | ICD-10-CM

## 2018-09-09 DIAGNOSIS — O26893 Other specified pregnancy related conditions, third trimester: Secondary | ICD-10-CM

## 2018-09-09 DIAGNOSIS — J45909 Unspecified asthma, uncomplicated: Secondary | ICD-10-CM | POA: Insufficient documentation

## 2018-09-09 DIAGNOSIS — Z87891 Personal history of nicotine dependence: Secondary | ICD-10-CM | POA: Diagnosis not present

## 2018-09-09 DIAGNOSIS — O9989 Other specified diseases and conditions complicating pregnancy, childbirth and the puerperium: Secondary | ICD-10-CM | POA: Insufficient documentation

## 2018-09-09 DIAGNOSIS — O99013 Anemia complicating pregnancy, third trimester: Secondary | ICD-10-CM | POA: Diagnosis not present

## 2018-09-09 LAB — URINALYSIS, ROUTINE W REFLEX MICROSCOPIC
Bilirubin Urine: NEGATIVE
Glucose, UA: NEGATIVE mg/dL
Hgb urine dipstick: NEGATIVE
Ketones, ur: NEGATIVE mg/dL
Nitrite: NEGATIVE
Protein, ur: 30 mg/dL — AB
Specific Gravity, Urine: 1.024 (ref 1.005–1.030)
pH: 6 (ref 5.0–8.0)

## 2018-09-09 LAB — PROTEIN / CREATININE RATIO, URINE
Creatinine, Urine: 274.27 mg/dL
Protein Creatinine Ratio: 0.07 mg/mg{Cre} (ref 0.00–0.15)
Total Protein, Urine: 18 mg/dL

## 2018-09-09 LAB — COMPREHENSIVE METABOLIC PANEL
ALT: 11 U/L (ref 0–44)
AST: 12 U/L — ABNORMAL LOW (ref 15–41)
Albumin: 2.9 g/dL — ABNORMAL LOW (ref 3.5–5.0)
Alkaline Phosphatase: 53 U/L (ref 38–126)
Anion gap: 9 (ref 5–15)
BUN: <5 mg/dL — ABNORMAL LOW (ref 6–20)
CO2: 21 mmol/L — ABNORMAL LOW (ref 22–32)
Calcium: 9 mg/dL (ref 8.9–10.3)
Chloride: 106 mmol/L (ref 98–111)
Creatinine, Ser: 0.55 mg/dL (ref 0.44–1.00)
GFR calc Af Amer: >60 mL/min (ref >60)
GFR calc non Af Amer: >60 mL/min (ref >60)
Glucose, Bld: 83 mg/dL (ref 70–99)
Potassium: 3.5 mmol/L (ref 3.5–5.1)
Sodium: 136 mmol/L (ref 135–145)
Total Bilirubin: 0.5 mg/dL (ref 0.3–1.2)
Total Protein: 6 g/dL — ABNORMAL LOW (ref 6.5–8.1)

## 2018-09-09 LAB — CBC
HCT: 35.5 % — ABNORMAL LOW (ref 36.0–46.0)
Hemoglobin: 11.2 g/dL — ABNORMAL LOW (ref 12.0–15.0)
MCH: 24.7 pg — ABNORMAL LOW (ref 26.0–34.0)
MCHC: 31.5 g/dL (ref 30.0–36.0)
MCV: 78.4 fL — ABNORMAL LOW (ref 80.0–100.0)
Platelets: 261 10*3/uL (ref 150–400)
RBC: 4.53 MIL/uL (ref 3.87–5.11)
RDW: 12.3 % (ref 11.5–15.5)
WBC: 15.5 10*3/uL — ABNORMAL HIGH (ref 4.0–10.5)
nRBC: 0 % (ref 0.0–0.2)

## 2018-09-09 MED ORDER — DEXAMETHASONE SODIUM PHOSPHATE 10 MG/ML IJ SOLN
10.0000 mg | Freq: Once | INTRAMUSCULAR | Status: AC
  Administered 2018-09-09: 10 mg via INTRAVENOUS
  Filled 2018-09-09: qty 1

## 2018-09-09 MED ORDER — ACETAMINOPHEN 500 MG PO TABS
1000.0000 mg | ORAL_TABLET | Freq: Once | ORAL | Status: AC
  Administered 2018-09-09: 1000 mg via ORAL
  Filled 2018-09-09: qty 2

## 2018-09-09 MED ORDER — DIPHENHYDRAMINE HCL 50 MG/ML IJ SOLN
25.0000 mg | Freq: Once | INTRAMUSCULAR | Status: AC
  Administered 2018-09-09: 25 mg via INTRAVENOUS
  Filled 2018-09-09: qty 1

## 2018-09-09 MED ORDER — METOCLOPRAMIDE HCL 10 MG PO TABS
10.0000 mg | ORAL_TABLET | Freq: Once | ORAL | Status: AC
  Administered 2018-09-09: 10 mg via ORAL
  Filled 2018-09-09: qty 1

## 2018-09-09 MED ORDER — SODIUM CHLORIDE 0.9 % IV SOLN
INTRAVENOUS | Status: DC
  Administered 2018-09-09: 15:00:00 via INTRAVENOUS

## 2018-09-09 MED ORDER — METOCLOPRAMIDE HCL 5 MG/ML IJ SOLN
10.0000 mg | Freq: Once | INTRAMUSCULAR | Status: AC
  Administered 2018-09-09: 15:00:00 10 mg via INTRAVENOUS
  Filled 2018-09-09: qty 2

## 2018-09-09 NOTE — Progress Notes (Signed)
  Patient was seen on 09/09/2018 for Gestational Diabetes self-management. EDD 11/29/2018. Patient states no history of GDM. 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 Diet history obtained. Patient eats poor variety of all food groups and typically only eats one meal a day stating she is frequently nauseated. Beverages include water, cranberry juice and sweet tea.  The following learning objectives were met by the patient :   States the definition of Gestational Diabetes  States why dietary management is important in controlling blood glucose  Describes the effects of carbohydrates on blood glucose levels  Demonstrates ability to create a balanced meal plan  Demonstrates carbohydrate counting   States when to check blood glucose levels  Demonstrates proper blood glucose monitoring techniques  States the effect of stress and exercise on blood glucose levels  States the importance of limiting caffeine and abstaining from alcohol and smoking  Plan:  Aim for 3 Carb Choices per meal (45 grams) +/- 1 either way  Aim for 1-2 Carbs per snack We discussed the importance of feeding her body throughout the day vs once a day. Begin reading food labels for Total Carbohydrate of foods If OK with your MD, consider  increasing your activity level by walking, Arm Chair Exercises or other activity daily as tolerated Begin checking BG before breakfast and 2 hours after first bite of breakfast, lunch and dinner as directed by MD  Bring Log Book/Sheet and meter to every medical appointment OR use Baby Scripts (see below) Baby Scripts:  Patient was introduced to [employer] and plans to use as record of BG electronically  Take medication if directed by MD  Patient already has a meter: Accu Chek Guide but has not started testing yet. Patient instructed to test pre breakfast and 2 hours each meal as directed by MD  Patient instructed to monitor glucose levels: FBS: 60 - 95 mg/dl 2  hour: <120 mg/dl  Patient received the following handouts:  Nutrition Diabetes and Pregnancy  Carbohydrate Counting List  BG Log Sheet  Patient will be seen for follow-up as needed.

## 2018-09-09 NOTE — Telephone Encounter (Signed)
Patient was seen in office this morning to see Jeanie Sewer - Diabetes Educator. Pt states that she had gotten her supplies and took her blood sugar without knowing what to do. Spoke to Bev and she stated to ignore the reading and use that 1st reading as a test because patient did it incorrectly.  Called patient about her blood pressure reading of 170/105 states that she isn't feeling well nauseous and very dizzy, patient states she has a really bad headache and was advised a few days ago to go to MAU but declined. Patient advised me that her fiance told her to go and she will go now. Advised patient that she should go now and Dr.Davis was calling the MAU right now to advise that patient would be on the way. Pt verbalized importance of going and stated that she was on her way.

## 2018-09-09 NOTE — MAU Provider Note (Signed)
Chief Complaint:  Headache and Hypertension   First Provider Initiated Contact with Patient 09/09/18 1302     HPI: Jenna Stephens is a 43 y.o. [id number] at 56w3dwho presents to maternity admissions reporting headache & hypertension. Has chronic hypertension. Currently taking labetalol [id number] & procardia xl 30 mg daily.  Took BP at home earlier today & it was 170/105.  Reports severe headache since this morning. Has not treated pain. Denies visual disturbance or epigastric pain. Has some associated nausea.  Denies contractions, leakage of fluid or vaginal bleeding. Good fetal movement.  Location: frontal headache Quality: sharp Severity: 8/10 in pain scale Duration: <1 day Timing: constant Modifying factors: nothing makes worse. Hasn't taken anything for symptoms Associated signs and symptoms: nausea    Past Medical History:  Diagnosis Date  . Anxiety   . Asthma   . Hypertension   . Iron deficiency anemia   . Obesity    OB History  Gravida Para Term Preterm AB Living  _0 0 3 2  SAB TAB Ectopic Multiple Live Births  2 1 0   2    # Outcome Date GA Lbr Len/2nd Weight Sex Delivery Anes PTL Lv  6 Current           5 SAB 2019          4 TAB 2012          3 Term 05/18/09 [id number] g F Vag-Spont EPI N LIV  2 Term 10/12/99 [id number] g M Vag-Spont  N LIV  1 SAB            Past Surgical History:  Procedure Laterality Date  . TONSILLECTOMY     Family History  Problem Relation Age of Onset  . Diabetes Mother   . Hypertension Mother    Social History   Tobacco Use  . Smoking status: Former Smoker    Packs/day: 0.25    Last attempt to quit: 01/06/2018    Years since quitting: 0.6  . Smokeless tobacco: Never Used  . Tobacco comment: stopped smoking after this hospital admission  Substance Use Topics  . Alcohol use: Not Currently    Comment: weekends only  . Drug use: Not Currently    Types: Marijuana    Comment: last use September 24 2016   No Known Allergies No  medications prior to admission.    I have reviewed patient's Past Medical Hx, Surgical Hx, Family Hx, Social Hx, medications and allergies.   ROS:  Review of Systems  Constitutional: Negative.   Eyes: Negative for visual disturbance.  Respiratory: Negative for cough and shortness of breath.   Cardiovascular: Positive for leg swelling. Negative for chest pain.  Gastrointestinal: Positive for nausea. Negative for abdominal pain, diarrhea and vomiting.  Genitourinary: Negative.   Neurological: Positive for headaches.    Physical Exam   Patient Vitals for the past 24 hrs:  BP Temp Temp src Pulse Resp SpO2 Height Weight  09/09/18 1605 124/87 98 F (36.7 C) Oral 90 19 - - -  09/09/18 1531 129/84 - - 84 - - - -  09/09/18 1516 109/81 - - 92 - - - -  09/09/18 1446 137/74 - - 82 - - - -  09/09/18 1431 136/69 - - 82 - - - -  09/09/18 1416 128/85 - - 86 - - - -  09/09/18 1401 134/83 - - 89 - - - -  09/09/18 1346 (!) 145/82 - -  94 - - - -  09/09/18 1331 (!) 147/85 - - 91 - - - -  09/09/18 1316 (!) 146/82 - - 91 - - - -  09/09/18 1305 - - - - - 97 % - -  09/09/18 1301 140/84 - - 92 - - - -  09/09/18 1300 - - - - - 98 % - -  09/09/18 1255 - 98 F (36.7 C) Oral - 20 - - -  09/09/18 1254 (!) 144/87 - - 92 - - - -  09/09/18 1252 (!) 141/85 - - 92 - - - -  09/09/18 1238 (!) 131/91 98.2 F (36.8 C) Oral 93 18 100 % _0  (1.727 m) 124.8 kg    Constitutional: Well-developed, well-nourished female in no acute distress.  Cardiovascular: normal rate & rhythm, no murmur Respiratory: normal effort, lung sounds clear throughout GI: Abd soft, non-tender, gravid appropriate for gestational age. Pos BS x 4 MS: Extremities nontender, no edema, normal ROM Neurologic: Alert and oriented x 4. Normal DTRs & no clonus  NST:  Baseline: 135 bpm, Variability: Good {> 6 bpm), Accelerations: Non-reactive but appropriate for gestational age and Decelerations: Variable: mild   Labs: Results for orders  placed or performed during the hospital encounter of 09/09/18 (from the past 24 hour(s))  CBC     Status: Abnormal   Collection Time: 09/09/18  1:10 PM  Result Value Ref Range   WBC 15.5 (H) 4.0 - 10.5 K/uL   RBC 4.53 3.87 - 5.11 MIL/uL   Hemoglobin 11.2 (L) 12.0 - 15.0 g/dL   HCT 35.5 (L) 36.0 - 46.0 %   MCV 78.4 (L) 80.0 - 100.0 fL   MCH 24.7 (L) 26.0 - 34.0 pg   MCHC 31.5 30.0 - 36.0 g/dL   RDW 12.3 11.5 - 15.5 %   Platelets 261 150 - 400 K/uL   nRBC 0.0 0.0 - 0.2 %  Comprehensive metabolic panel     Status: Abnormal   Collection Time: 09/09/18  1:10 PM  Result Value Ref Range   Sodium 136 135 - 145 mmol/L   Potassium 3.5 3.5 - 5.1 mmol/L   Chloride 106 98 - 111 mmol/L   CO2 21 (L) 22 - 32 mmol/L   Glucose, Bld 83 70 - 99 mg/dL   BUN <5 (L) 6 - 20 mg/dL   Creatinine, Ser 0.55 0.44 - 1.00 mg/dL   Calcium 9.0 8.9 - 10.3 mg/dL   Total Protein 6.0 (L) 6.5 - 8.1 g/dL   Albumin 2.9 (L) 3.5 - 5.0 g/dL   AST 12 (L) 15 - 41 U/L   ALT 11 0 - 44 U/L   Alkaline Phosphatase 53 38 - 126 U/L   Total Bilirubin 0.5 0.3 - 1.2 mg/dL   GFR calc non Af Amer >60 >60 mL/min   GFR calc Af Amer >60 >60 mL/min   Anion gap 9 5 - 15  Protein / creatinine ratio, urine     Status: None   Collection Time: 09/09/18  1:10 PM  Result Value Ref Range   Creatinine, Urine 274.27 mg/dL   Total Protein, Urine 18 mg/dL   Protein Creatinine Ratio 0.07 0.00 - 0.15 mg/mg[Cre]  Urinalysis, Routine w reflex microscopic     Status: Abnormal   Collection Time: 09/09/18  1:10 PM  Result Value Ref Range   Color, Urine YELLOW YELLOW   APPearance CLOUDY (A) CLEAR   Specific Gravity, Urine 1.024 1.005 - 1.030   pH 6.0 5.0 -  8.0   Glucose, UA NEGATIVE NEGATIVE mg/dL   Hgb urine dipstick NEGATIVE NEGATIVE   Bilirubin Urine NEGATIVE NEGATIVE   Ketones, ur NEGATIVE NEGATIVE mg/dL   Protein, ur 30 (A) NEGATIVE mg/dL   Nitrite NEGATIVE NEGATIVE   Leukocytes,Ua MODERATE (A) NEGATIVE   RBC / HPF 0-5 0 - 5 RBC/hpf    WBC, UA 6-10 0 - 5 WBC/hpf   Bacteria, UA FEW (A) NONE SEEN   Squamous Epithelial / LPF 11-20 0 - 5   Mucus PRESENT    Hyaline Casts, UA PRESENT     Imaging:  No results found.  MAU Course: Orders Placed This Encounter  Procedures  . Culture, OB Urine  . CBC  . Comprehensive metabolic panel  . Protein / creatinine ratio, urine  . Urinalysis, Routine w reflex microscopic  . Insert peripheral IV  . Discharge patient   Meds ordered this encounter  Medications  . acetaminophen (TYLENOL) tablet 1,000 mg  . metoCLOPramide (REGLAN) tablet 10 mg  . AND Linked Order Group   . 0.9 %  sodium chloride infusion   . diphenhydrAMINE (BENADRYL) injection 25 mg   . metoCLOPramide (REGLAN) injection 10 mg   . dexamethasone (DECADRON) injection 10 mg    MDM: No severe range BPs while in MAU. PEC labs done & are reassuring.  Tylenol & reglan given PO initially for headache with minimal improvement. Pt opted for the IV headache cocktail (benadryl, decadron, reglan) & reports feeling much better.   Assessment: 1. Chronic hypertension during pregnancy   2. [redacted] weeks gestation of pregnancy   3. Pregnancy headache in third trimester     Plan: Discharge home in stable condition.  Discussed reasons to return to MAU Continue antihypertensives as previously prescriped   Allergies as of 09/09/2018   No Known Allergies     Medication List    TAKE these medications   Accu-Chek FastClix Lancets Misc 1 Units by Percutaneous route 4 (four) times daily.   Accu-Chek Nano SmartView w/Device Kit 1 kit by Subdermal route as directed. Check blood sugars for fasting, and two hours after breakfast, lunch and dinner (4 checks daily)   albuterol 108 (90 Base) MCG/ACT inhaler Commonly known as:  VENTOLIN HFA Inhale 2 puffs into the lungs every 6 (six) hours as needed for wheezing or shortness of breath.   aspirin 81 MG chewable tablet Chew by mouth daily.   ferrous sulfate 325 (65 FE) MG  tablet Take 1-2 tablets (325-650 mg total) by mouth daily with breakfast.   fluticasone furoate-vilanterol 200-25 MCG/INH Aepb Commonly known as:  Breo Ellipta Inhale 1 puff into the lungs daily.   glucose blood test strip Commonly known as:  Accu-Chek SmartView Use as instructed to check blood sugars   labetalol 200 MG tablet Commonly known as:  NORMODYNE Take 2 tablets by mouth 3 (three) times daily.   lansoprazole 30 MG capsule Commonly known as:  PREVACID Take 1 capsule (30 mg total) by mouth daily at 12 noon.   montelukast 10 MG tablet Commonly known as:  SINGULAIR Take 1 tablet (10 mg total) by mouth daily.   NIFEdipine 30 MG 24 hr tablet Commonly known as:  PROCARDIA-XL/NIFEDICAL-XL Take 1 tablet (30 mg total) by mouth daily.   prenatal multivitamin Tabs tablet Take 1 tablet by mouth daily at 12 noon.   promethazine 25 MG tablet Commonly known as:  PHENERGAN Take 1 tablet by mouth 2 (two) times daily.   umeclidinium bromide 62.5 MCG/INH Aepb Commonly  known as:  Incruse Ellipta Inhale 1 puff into the lungs daily.   zolpidem 10 MG tablet Commonly known as:  AMBIEN Take 10 mg by mouth at bedtime as needed for sleep.       Jorje Guild, NP 09/09/2018 4:52 PM

## 2018-09-09 NOTE — MAU Note (Signed)
Pt is a G6P2 c/o lower abdominal pain, HA, and fatigue stating"I dont feel well."

## 2018-09-09 NOTE — Discharge Instructions (Signed)
Hypertension During Pregnancy ° °Hypertension, commonly called high blood pressure, is when the force of blood pumping through your arteries is too strong. Arteries are blood vessels that carry blood from the heart throughout the body. Hypertension during pregnancy can cause problems for you and your baby. Your baby may be born early (prematurely) or may not weigh as much as he or she should at birth. Very bad cases of hypertension during pregnancy can be life-threatening. °Different types of hypertension can occur during pregnancy. These include: °· Chronic hypertension. This happens when: °? You have hypertension before pregnancy and it continues during pregnancy. °? You develop hypertension before you are [redacted] weeks pregnant, and it continues during pregnancy. °· Gestational hypertension. This is hypertension that develops after the 20th week of pregnancy. °· Preeclampsia, also called toxemia of pregnancy. This is a very serious type of hypertension that develops during pregnancy. It can be very dangerous for you and your baby. °? In rare cases, you may develop preeclampsia after giving birth (postpartum preeclampsia). This usually occurs within 48 hours after childbirth but may occur up to 6 weeks after giving birth. °Gestational hypertension and preeclampsia usually go away within 6 weeks after your baby is born. Women who have hypertension during pregnancy have a greater chance of developing hypertension later in life or during future pregnancies. °What are the causes? °The exact cause of hypertension during pregnancy is not known. °What increases the risk? °There are certain factors that make it more likely for you to develop hypertension during pregnancy. These include: °· Having hypertension during a previous pregnancy or prior to pregnancy. °· Being overweight. °· Being age 35 or older. °· Being pregnant for the first time. °· Being pregnant with more than one baby. °· Becoming pregnant using fertilization  methods such as IVF (in vitro fertilization). °· Having diabetes, kidney problems, or systemic lupus erythematosus. °· Having a family history of hypertension. °What are the signs or symptoms? °Chronic hypertension and gestational hypertension rarely cause symptoms. Preeclampsia causes symptoms, which may include: °· Increased protein in your urine. Your health care provider will check for this at every visit before you give birth (prenatal visit). °· Severe headaches. °· Sudden weight gain. °· Swelling of the hands, face, legs, and feet. °· Nausea and vomiting. °· Vision problems, such as blurred or double vision. °· Numbness in the face, arms, legs, and feet. °· Dizziness. °· Slurred speech. °· Sensitivity to bright lights. °· Abdominal pain. °· Convulsions or seizures. °How is this diagnosed? °You may be diagnosed with hypertension during a routine prenatal exam. At each prenatal visit, you may: °· Have a urine test to check for high amounts of protein in your urine. °· Have your blood pressure checked. A blood pressure reading is given as two numbers, such as "120 over 80" (or 120/80). The first ("top") number is a measure of the pressure in your arteries when your heart beats (systolic pressure). The second ("bottom") number is a measure of the pressure in your arteries as your heart relaxes between beats (diastolic pressure). Blood pressure is measured in a unit called mm Hg. For most women, a normal blood pressure reading is: °? Systolic: below 120. °? Diastolic: below 80. °The type of hypertension that you are diagnosed with depends on your test results and when your symptoms developed. °· Chronic hypertension is usually diagnosed before 20 weeks of pregnancy. °· Gestational hypertension is usually diagnosed after 20 weeks of pregnancy. °· Hypertension with high amounts of protein in   the urine is diagnosed as preeclampsia.  Blood pressure measurements that stay above 170 systolic, or above 017 diastolic,  are signs of severe preeclampsia. How is this treated? Treatment for hypertension during pregnancy varies depending on the type of hypertension you have and how serious it is.  If you take medicines called ACE inhibitors to treat chronic hypertension, you may need to switch medicines. ACE inhibitors should not be taken during pregnancy.  If you have gestational hypertension, you may need to take blood pressure medicine.  If you are at risk for preeclampsia, your health care provider may recommend that you take a low-dose aspirin during your pregnancy.  If you have severe preeclampsia, you may need to be hospitalized so you and your baby can be monitored closely. You may also need to take medicine (magnesium sulfate) to prevent seizures and to lower blood pressure. This medicine may be given as an injection or through an IV.  In some cases, if your condition gets worse, you may need to deliver your baby early. Follow these instructions at home: Eating and drinking   Drink enough fluid to keep your urine pale yellow.  Avoid caffeine. Lifestyle  Do not use any products that contain nicotine or tobacco, such as cigarettes and e-cigarettes. If you need help quitting, ask your health care provider.  Do not use alcohol or drugs.  Avoid stress as much as possible. Rest and get plenty of sleep. General instructions  Take over-the-counter and prescription medicines only as told by your health care provider.  While lying down, lie on your left side. This keeps pressure off your major blood vessels.  While sitting or lying down, raise (elevate) your feet. Try putting some pillows under your lower legs.  Exercise regularly. Ask your health care provider what kinds of exercise are best for you.  Keep all prenatal and follow-up visits as told by your health care provider. This is important. Contact a health care provider if:  You have symptoms that your health care provider told you may  require more treatment or monitoring, such as: ? Nausea or vomiting. ? Headache. Get help right away if you have:  Severe abdominal pain that does not get better with treatment.  A severe headache that does not get better.  Vomiting that does not get better.  Sudden, rapid weight gain.  Sudden swelling in your hands, ankles, or face.  Vaginal bleeding.  Blood in your urine.  Fewer movements from your baby than usual.  Blurred or double vision.  Muscle twitching or sudden muscle tightening (spasms).  Shortness of breath.  Blue fingernails or lips. Summary  Hypertension, commonly called high blood pressure, is when the force of blood pumping through your arteries is too strong.  Hypertension during pregnancy can cause problems for you and your baby.  Treatment for hypertension during pregnancy varies depending on the type of hypertension you have and how serious it is.  Get help right away if you have symptoms that your health care provider told you to watch for. This information is not intended to replace advice given to you by your health care provider. Make sure you discuss any questions you have with your health care provider. Document Released: 12/26/2010 Document Revised: 03/26/2017 Document Reviewed: 09/23/2015 Elsevier Interactive Patient Education  2019 [place] Headache Without Cause A headache is pain or discomfort felt around the head or neck area. The specific cause of a headache may not be found. There are many causes and types  of headaches. A few common ones are:  Tension headaches.  Migraine headaches.  Cluster headaches.  Chronic daily headaches. Follow these instructions at home: Watch your condition for any changes. Let your health care provider know about them. Take these steps to help with your condition: Managing pain      Take over-the-counter and prescription medicines only as told by your health care provider.  Lie down  in a dark, quiet room when you have a headache.  If directed, put ice on your head and neck area: ? Put ice in a plastic bag. ? Place a towel between your skin and the bag. ? Leave the ice on for 20 minutes, 2-3 times per day.  If directed, apply heat to the affected area. Use the heat source that your health care provider recommends, such as a moist heat pack or a heating pad. ? Place a towel between your skin and the heat source. ? Leave the heat on for 20-30 minutes. ? Remove the heat if your skin turns bright red. This is especially important if you are unable to feel pain, heat, or cold. You may have a greater risk of getting burned.  Keep lights dim if bright lights bother you or make your headaches worse. Eating and drinking  Eat meals on a regular schedule.  If you drink alcohol: ? Limit how much you use to:  0-1 drink a day for women.  0-2 drinks a day for men. ? Be aware of how much alcohol is in your drink. In the [place] one drink equals one 12 oz bottle of beer (355 mL), one 5 oz glass of wine (148 mL), or one 1 oz glass of hard liquor (44 mL).  Stop drinking caffeine, or decrease the amount of caffeine you drink. General instructions   Keep a headache journal to help find out what may trigger your headaches. For example, write down: ? What you eat and drink. ? How much sleep you get. ? Any change to your diet or medicines.  Try massage or other relaxation techniques.  Limit stress.  Sit up straight, and do not tense your muscles.  Do not use any products that contain nicotine or tobacco, such as cigarettes, e-cigarettes, and chewing tobacco. If you need help quitting, ask your health care provider.  Exercise regularly as told by your health care provider.  Sleep on a regular schedule. Get 7-9 hours of sleep each night, or the amount recommended by your health care provider.  Keep all follow-up visits as told by your health care provider. This is  important. Contact a health care provider if:  Your symptoms are not helped by medicine.  You have a headache that is different from the usual headache.  You have nausea or you vomit.  You have a fever. Get help right away if:  Your headache becomes severe quickly.  Your headache gets worse after moderate to intense physical activity.  You have repeated vomiting.  You have a stiff neck.  You have a loss of vision.  You have problems with speech.  You have pain in the eye or ear.  You have muscular weakness or loss of muscle control.  You lose your balance or have trouble walking.  You feel faint or pass out.  You have confusion.  You have a seizure. Summary  A headache is pain or discomfort felt around the head or neck area.  There are many causes and types of headaches. In some cases, the  cause may not be found.  Keep a headache journal to help find out what may trigger your headaches. Watch your condition for any changes. Let your health care provider know about them.  Contact a health care provider if you have a headache that is different from the usual headache, or if your symptoms are not helped by medicine.  Get help right away if your headache becomes severe, you vomit, you have a loss of vision, you lose your balance, or you have a seizure. This information is not intended to replace advice given to you by your health care provider. Make sure you discuss any questions you have with your health care provider. Document Released: 04/09/2005 Document Revised: 10/28/2017 Document Reviewed: 10/28/2017 Elsevier Interactive Patient Education  2019 [employer].

## 2018-09-09 NOTE — MAU Note (Signed)
BP was high, doesn't feel good,  Having a few sharp pains in lower abd.  Dr's office told her to come straight here.  Has headache, no visual changes, feels a little light headed, denies epigastric pain, denies increase in swelling.

## 2018-09-11 LAB — CULTURE, OB URINE

## 2018-09-17 ENCOUNTER — Ambulatory Visit (HOSPITAL_COMMUNITY): Payer: Medicaid Other | Admitting: *Deleted

## 2018-09-17 ENCOUNTER — Other Ambulatory Visit (HOSPITAL_COMMUNITY): Payer: Self-pay | Admitting: *Deleted

## 2018-09-17 ENCOUNTER — Other Ambulatory Visit (HOSPITAL_COMMUNITY): Payer: Self-pay | Admitting: Maternal & Fetal Medicine

## 2018-09-17 ENCOUNTER — Encounter (HOSPITAL_COMMUNITY): Payer: Self-pay

## 2018-09-17 ENCOUNTER — Other Ambulatory Visit: Payer: Self-pay

## 2018-09-17 ENCOUNTER — Ambulatory Visit (HOSPITAL_COMMUNITY)
Admission: RE | Admit: 2018-09-17 | Discharge: 2018-09-17 | Disposition: A | Discharge status: Home / Self Care | Payer: Medicaid Other | Source: Ambulatory Visit | Attending: Maternal & Fetal Medicine | Admitting: Maternal & Fetal Medicine

## 2018-09-17 VITALS — BP 141/93 | HR 93 | Temp 98.4°F

## 2018-09-17 DIAGNOSIS — O09523 Supervision of elderly multigravida, third trimester: Secondary | ICD-10-CM | POA: Diagnosis present

## 2018-09-17 DIAGNOSIS — Z3A29 29 weeks gestation of pregnancy: Secondary | ICD-10-CM

## 2018-09-17 DIAGNOSIS — O365930 Maternal care for other known or suspected poor fetal growth, third trimester, not applicable or unspecified: Secondary | ICD-10-CM

## 2018-09-17 DIAGNOSIS — J45909 Unspecified asthma, uncomplicated: Secondary | ICD-10-CM

## 2018-09-17 DIAGNOSIS — O3413 Maternal care for benign tumor of corpus uteri, third trimester: Secondary | ICD-10-CM | POA: Diagnosis not present

## 2018-09-17 DIAGNOSIS — O10919 Unspecified pre-existing hypertension complicating pregnancy, unspecified trimester: Secondary | ICD-10-CM

## 2018-09-17 DIAGNOSIS — Z362 Encounter for other antenatal screening follow-up: Secondary | ICD-10-CM | POA: Diagnosis not present

## 2018-09-17 DIAGNOSIS — O99213 Obesity complicating pregnancy, third trimester: Secondary | ICD-10-CM

## 2018-09-17 DIAGNOSIS — O10013 Pre-existing essential hypertension complicating pregnancy, third trimester: Secondary | ICD-10-CM

## 2018-09-17 DIAGNOSIS — O9989 Other specified diseases and conditions complicating pregnancy, childbirth and the puerperium: Secondary | ICD-10-CM | POA: Diagnosis not present

## 2018-09-17 NOTE — Progress Notes (Signed)
[place] m

## 2018-09-18 ENCOUNTER — Encounter: Payer: Medicaid Other | Admitting: Obstetrics and Gynecology

## 2018-09-22 ENCOUNTER — Encounter: Payer: Self-pay | Admitting: Family Medicine

## 2018-09-22 ENCOUNTER — Telehealth: Payer: Self-pay | Admitting: Advanced Practice Midwife

## 2018-09-22 ENCOUNTER — Telehealth: Payer: Self-pay | Admitting: Family Medicine

## 2018-09-22 NOTE — Telephone Encounter (Signed)
Attempted to contact patient to inform her that her accomodation form has been completed and ready for pick-up. No answer, left voicemail with this information and that the $15 payment will be due when picking up the paperwork.

## 2018-09-22 NOTE — Telephone Encounter (Signed)
The patient called in to pay the fee for the FMLA documents over the phone.

## 2018-09-23 ENCOUNTER — Telehealth (INDEPENDENT_AMBULATORY_CARE_PROVIDER_SITE_OTHER): Payer: Medicaid Other | Admitting: Obstetrics and Gynecology

## 2018-09-23 ENCOUNTER — Other Ambulatory Visit: Payer: Self-pay

## 2018-09-23 ENCOUNTER — Encounter: Payer: Self-pay | Admitting: Obstetrics and Gynecology

## 2018-09-23 VITALS — BP 156/101 | HR 104

## 2018-09-23 DIAGNOSIS — O0993 Supervision of high risk pregnancy, unspecified, third trimester: Secondary | ICD-10-CM

## 2018-09-23 DIAGNOSIS — O10913 Unspecified pre-existing hypertension complicating pregnancy, third trimester: Secondary | ICD-10-CM

## 2018-09-23 DIAGNOSIS — O24419 Gestational diabetes mellitus in pregnancy, unspecified control: Secondary | ICD-10-CM

## 2018-09-23 DIAGNOSIS — Z029 Encounter for administrative examinations, unspecified: Secondary | ICD-10-CM

## 2018-09-23 DIAGNOSIS — O0990 Supervision of high risk pregnancy, unspecified, unspecified trimester: Secondary | ICD-10-CM

## 2018-09-23 DIAGNOSIS — O09529 Supervision of elderly multigravida, unspecified trimester: Secondary | ICD-10-CM

## 2018-09-23 DIAGNOSIS — O10919 Unspecified pre-existing hypertension complicating pregnancy, unspecified trimester: Secondary | ICD-10-CM

## 2018-09-23 DIAGNOSIS — O09523 Supervision of elderly multigravida, third trimester: Secondary | ICD-10-CM

## 2018-09-23 MED ORDER — "INSULIN SYRINGE-NEEDLE U-100 30G X 5/16"" 0.5 ML MISC": 1.0000 | 6 refills | Status: DC | PRN

## 2018-09-23 MED ORDER — NIFEDIPINE ER OSMOTIC RELEASE 30 MG PO TB24: 60.0000 mg | ORAL_TABLET | Freq: Every day | ORAL | 2 refills | Status: DC

## 2018-09-23 MED ORDER — INSULIN NPH (HUMAN) (ISOPHANE) 100 UNIT/ML ~~LOC~~ SUSP: 10.0000 [IU] | Freq: Every day | SUBCUTANEOUS | 3 refills | Status: DC

## 2018-09-23 NOTE — Addendum Note (Signed)
Addended by: Donn Pierini on: 09/23/2018 11:52 AM   Modules accepted: Orders

## 2018-09-23 NOTE — Progress Notes (Signed)
[place] VIRTUAL VIDEO VISIT ENCOUNTER NOTE  Provider location: Center for [employer] at [facility]   I connected with [name] on 09/23/18 at  9:35 AM EDT by MyChart Video Encounter at home and verified that I am speaking with the correct person using two identifiers.   I discussed the limitations, risks, security and privacy concerns of performing an evaluation and management service by telephone and the availability of in person appointments. I also discussed with the patient that there may be a patient responsible charge related to this service. The patient expressed understanding and agreed to proceed. Subjective:  Jenna Stephens is a 43 y.o. [id number] at [redacted]w[redacted]d being seen today for ongoing prenatal care.  She is currently monitored for the following issues for this high-risk pregnancy and has Acute respiratory failure ([place]); HTN (hypertension); Obesity; Asthma with status asthmaticus; Dyspnea; Severe persistent asthma without complication; Asthma exacerbation; Leukocytosis; Chest tightness; Hypokalemia; GERD (gastroesophageal reflux disease); Supervision of high risk pregnancy, antepartum; AMA (advanced maternal age) multigravida 35+, unspecified trimester; Chronic hypertension in pregnancy; and Gestational diabetes mellitus (GDM) affecting pregnancy, antepartum on their problem list.  Patient reports fatigue. Complains of swollen feet and legs, poor appetite. Contractions: Not present. Vag. Bleeding: None.  Movement: Present. Denies any leaking of fluid.   The following portions of the patient's history were reviewed and updated as appropriate: allergies, current medications, past family history, past medical history, past social history, past surgical history and problem list.   Objective:   Vitals:   09/23/18 0943  BP: (!) 156/101  Pulse: (!) 104    Fetal Status:     Movement: Present     General:  Alert, oriented and cooperative. Patient is  in no acute distress.  Respiratory: Normal respiratory effort, no problems with respiration noted  Mental Status: Normal mood and affect. Normal behavior. Normal judgment and thought content.  Rest of physical exam deferred due to type of encounter  Assessment and Plan:  Pregnancy: [id number] at [redacted]w[redacted]d  1. Gestational diabetes mellitus (GDM) affecting pregnancy, antepartum - All fasting glucose in 100-110s, post prandials wnl - recommended to start NPH 10 units QHS, patient agreeable - diabetic and insulin teaching scheduled - will need testing starting 32 weeks  2. Supervision of high risk pregnancy, antepartum  3. AMA (advanced maternal age) multigravida 43+, unspecified trimester  4. Chronic hypertension in pregnancy BP remains elevated today On labetalol 200 mg TID and procardia XL 30 mg Will increase to procardia 60 mg XL   Preterm labor symptoms and general obstetric precautions including but not limited to vaginal bleeding, contractions, leaking of fluid and fetal movement were reviewed in detail with the patient. I discussed the assessment and treatment plan with the patient. The patient was provided an opportunity to ask questions and all were answered. The patient agreed with the plan and demonstrated an understanding of the instructions. The patient was advised to call back or seek an in-person office evaluation/go to MAU at [facility] for any urgent or concerning symptoms. Please refer to After Visit Summary for other counseling recommendations.   I provided 16 minutes of face-to-face time during this encounter.  Return in about 2 weeks (around 10/07/2018) for OB visit (MD), virtual.  Future Appointments  Date Time Provider [place]  10/15/2018  9:30 AM [place] [facility]-US  10/15/2018  9:30 AM [facility] [place] [facility]-M[facility] M[facility]-US    Trayonna Bachmeier M Emmersen Garraway, [place] for [facility], [facility]

## 2018-09-23 NOTE — Progress Notes (Signed)
Slight headache, watched patient do her blood pressure and she's done it correctly.   Blood Sugars in Baby Rx are triggering

## 2018-09-24 ENCOUNTER — Telehealth: Payer: Self-pay | Admitting: *Deleted

## 2018-09-24 NOTE — Telephone Encounter (Signed)
Received email cbg alert from [employer]. Seen in office yesterday by Dr.Davis- will route to her.

## 2018-09-25 NOTE — Telephone Encounter (Signed)
Patient started on insulin at visit yesterday for GDM.

## 2018-09-26 ENCOUNTER — Other Ambulatory Visit: Payer: Self-pay | Admitting: Obstetrics & Gynecology

## 2018-09-29 ENCOUNTER — Telehealth: Payer: Self-pay | Admitting: General Practice

## 2018-09-29 ENCOUNTER — Other Ambulatory Visit: Payer: Self-pay

## 2018-09-29 ENCOUNTER — Encounter (HOSPITAL_COMMUNITY): Payer: Self-pay

## 2018-09-29 ENCOUNTER — Telehealth: Payer: Self-pay | Admitting: Emergency Medicine

## 2018-09-29 ENCOUNTER — Inpatient Hospital Stay (HOSPITAL_COMMUNITY)
Admission: AD | Admit: 2018-09-29 | Discharge: 2018-09-29 | Disposition: A | Discharge status: Home / Self Care | Payer: Medicaid Other | Attending: Obstetrics & Gynecology | Admitting: Obstetrics & Gynecology

## 2018-09-29 DIAGNOSIS — Z3A31 31 weeks gestation of pregnancy: Secondary | ICD-10-CM

## 2018-09-29 DIAGNOSIS — O24419 Gestational diabetes mellitus in pregnancy, unspecified control: Secondary | ICD-10-CM

## 2018-09-29 DIAGNOSIS — O163 Unspecified maternal hypertension, third trimester: Secondary | ICD-10-CM

## 2018-09-29 DIAGNOSIS — O10919 Unspecified pre-existing hypertension complicating pregnancy, unspecified trimester: Secondary | ICD-10-CM

## 2018-09-29 DIAGNOSIS — Z87891 Personal history of nicotine dependence: Secondary | ICD-10-CM | POA: Insufficient documentation

## 2018-09-29 DIAGNOSIS — R519 Headache, unspecified: Secondary | ICD-10-CM | POA: Diagnosis present

## 2018-09-29 DIAGNOSIS — O10013 Pre-existing essential hypertension complicating pregnancy, third trimester: Secondary | ICD-10-CM | POA: Diagnosis not present

## 2018-09-29 DIAGNOSIS — O26893 Other specified pregnancy related conditions, third trimester: Secondary | ICD-10-CM | POA: Diagnosis not present

## 2018-09-29 DIAGNOSIS — R51 Headache: Secondary | ICD-10-CM

## 2018-09-29 DIAGNOSIS — O24414 Gestational diabetes mellitus in pregnancy, insulin controlled: Secondary | ICD-10-CM | POA: Insufficient documentation

## 2018-09-29 LAB — CBC
HCT: 34.8 % — ABNORMAL LOW (ref 36.0–46.0)
Hemoglobin: 10.8 g/dL — ABNORMAL LOW (ref 12.0–15.0)
MCH: 24.5 pg — ABNORMAL LOW (ref 26.0–34.0)
MCHC: 31 g/dL (ref 30.0–36.0)
MCV: 79.1 fL — ABNORMAL LOW (ref 80.0–100.0)
Platelets: 279 10*3/uL (ref 150–400)
RBC: 4.4 MIL/uL (ref 3.87–5.11)
RDW: 12.4 % (ref 11.5–15.5)
WBC: 15.5 10*3/uL — ABNORMAL HIGH (ref 4.0–10.5)
nRBC: 0 % (ref 0.0–0.2)

## 2018-09-29 LAB — URINALYSIS, ROUTINE W REFLEX MICROSCOPIC
Bilirubin Urine: NEGATIVE
Glucose, UA: NEGATIVE mg/dL
Hgb urine dipstick: NEGATIVE
Ketones, ur: NEGATIVE mg/dL
Leukocytes,Ua: NEGATIVE
Nitrite: NEGATIVE
Protein, ur: NEGATIVE mg/dL
Specific Gravity, Urine: 1.021 (ref 1.005–1.030)
pH: 7 (ref 5.0–8.0)

## 2018-09-29 LAB — COMPREHENSIVE METABOLIC PANEL
ALT: 15 U/L (ref 0–44)
AST: 16 U/L (ref 15–41)
Albumin: 2.9 g/dL — ABNORMAL LOW (ref 3.5–5.0)
Alkaline Phosphatase: 70 U/L (ref 38–126)
Anion gap: 10 (ref 5–15)
BUN: 5 mg/dL — ABNORMAL LOW (ref 6–20)
CO2: 21 mmol/L — ABNORMAL LOW (ref 22–32)
Calcium: 9.2 mg/dL (ref 8.9–10.3)
Chloride: 104 mmol/L (ref 98–111)
Creatinine, Ser: 0.57 mg/dL (ref 0.44–1.00)
GFR calc Af Amer: >60 mL/min (ref >60)
GFR calc non Af Amer: >60 mL/min (ref >60)
Glucose, Bld: 77 mg/dL (ref 70–99)
Potassium: 3.8 mmol/L (ref 3.5–5.1)
Sodium: 135 mmol/L (ref 135–145)
Total Bilirubin: 0.4 mg/dL (ref 0.3–1.2)
Total Protein: 6.1 g/dL — ABNORMAL LOW (ref 6.5–8.1)

## 2018-09-29 LAB — PROTEIN / CREATININE RATIO, URINE
Creatinine, Urine: 214.38 mg/dL
Protein Creatinine Ratio: 0.06 mg/mg{Cre} (ref 0.00–0.15)
Total Protein, Urine: 12 mg/dL

## 2018-09-29 MED ORDER — LABETALOL HCL 5 MG/ML IV SOLN
20.0000 mg | INTRAVENOUS | Status: DC | PRN
  Administered 2018-09-29: 18:00:00 20 mg via INTRAVENOUS

## 2018-09-29 MED ORDER — LABETALOL HCL 5 MG/ML IV SOLN
80.0000 mg | INTRAVENOUS | Status: DC | PRN
  Filled 2018-09-29: qty 16

## 2018-09-29 MED ORDER — METOCLOPRAMIDE HCL 5 MG/ML IJ SOLN
10.0000 mg | Freq: Once | INTRAMUSCULAR | Status: AC
  Administered 2018-09-29: 10 mg via INTRAVENOUS
  Filled 2018-09-29: qty 2

## 2018-09-29 MED ORDER — LABETALOL HCL 5 MG/ML IV SOLN
40.0000 mg | INTRAVENOUS | Status: DC | PRN
  Administered 2018-09-29: 40 mg via INTRAVENOUS
  Filled 2018-09-29: qty 8

## 2018-09-29 MED ORDER — DEXAMETHASONE SODIUM PHOSPHATE 10 MG/ML IJ SOLN
10.0000 mg | Freq: Once | INTRAMUSCULAR | Status: AC
  Administered 2018-09-29: 10 mg via INTRAVENOUS
  Filled 2018-09-29: qty 1

## 2018-09-29 MED ORDER — HYDRALAZINE HCL 20 MG/ML IJ SOLN: 10.0000 mg | INTRAMUSCULAR | Status: DC | PRN

## 2018-09-29 MED ORDER — LACTATED RINGERS IV BOLUS
1000.0000 mL | Freq: Once | INTRAVENOUS | Status: AC
  Administered 2018-09-29: 1000 mL via INTRAVENOUS

## 2018-09-29 MED ORDER — LABETALOL HCL 300 MG PO TABS: 300.0000 mg | ORAL_TABLET | Freq: Three times a day (TID) | ORAL | 2 refills | Status: DC

## 2018-09-29 MED ORDER — LABETALOL HCL 5 MG/ML IV SOLN
INTRAVENOUS | Status: AC
  Filled 2018-09-29: qty 4

## 2018-09-29 MED ORDER — DIPHENHYDRAMINE HCL 50 MG/ML IJ SOLN
25.0000 mg | Freq: Once | INTRAMUSCULAR | Status: AC
  Administered 2018-09-29: 25 mg via INTRAVENOUS
  Filled 2018-09-29: qty 1

## 2018-09-29 NOTE — Telephone Encounter (Signed)
Received alert from [employer] for multiple elevated blood sugars. Per chart review, patient saw Dr Rosana Hoes on 6/2 & was started on NPH. Will route to Dr Rosana Hoes.

## 2018-09-29 NOTE — Telephone Encounter (Signed)
Received call from [employer] rep regarding pt blood pressures of 143/94,151/98, and 166/105. Pt was called regarding these values. Pt reported that she had a headache and her feet were "swollen like balloons". Pt denied having dizziness or changes in vision. Pt was then advised to go to MAU to be evaluated for pre-eclampsia. Pt verbalized understanding and had no further questions.

## 2018-09-29 NOTE — Discharge Instructions (Signed)
I have increased your Labetalol dose to 300 mg three times a day.

## 2018-09-29 NOTE — MAU Provider Note (Signed)
History     CSN: [id number]  Arrival date and time: 09/29/18 1522   First Provider Initiated Contact with Patient 09/29/18 1633      Chief Complaint  Patient presents with  . Hypertension   HPI  Ms.  Jenna Stephens is a 43 y.o. year old G9P2032 female at [redacted]w[redacted]d weeks gestation who presents to MAU reporting she has a H/A, feet swollen "like balloons," BPs 143/94,151/98, & 166/105. She has cHTN and takes Labetalol 200 mg TID and Procardia XL 60 mg daily. She also is an A2 GDM on insulin. She reports that she hasn't started taking insulin yet, because she "just picked up the Rx." She denies dizziness & no visual changes. She took Tylenol 1000 mg at 1330 with no relief in the H/A.   Past Medical History:  Diagnosis Date  . Anxiety   . Asthma   . Hypertension   . Iron deficiency anemia   . Obesity     Past Surgical History:  Procedure Laterality Date  . TONSILLECTOMY      Family History  Problem Relation Age of Onset  . Diabetes Mother   . Hypertension Mother     Social History   Tobacco Use  . Smoking status: Former Smoker    Packs/day: 0.25    Last attempt to quit: 01/06/2018    Years since quitting: 0.7  . Smokeless tobacco: Never Used  . Tobacco comment: stopped smoking after this hospital admission  Substance Use Topics  . Alcohol use: Not Currently    Comment: weekends only  . Drug use: Not Currently    Types: Marijuana    Comment: last use September 24 2016    Allergies: No Known Allergies  No medications prior to admission.    Review of Systems  Constitutional: Negative.   HENT: Negative.   Eyes: Negative.  Negative for visual disturbance.  Respiratory: Negative.   Cardiovascular: Positive for leg swelling (plus feet).  Gastrointestinal: Negative.   Endocrine: Negative.   Genitourinary: Negative.   Musculoskeletal: Negative.   Skin: Negative.   Allergic/Immunologic: Negative.   Neurological: Positive for headaches (took Tylenol 1000 mg @ 1330 w/ no  relief). Negative for dizziness.  Hematological: Negative.   Psychiatric/Behavioral: Negative.    Physical Exam   Patient Vitals for the past 24 hrs:  BP Temp Temp src Pulse Resp SpO2  09/29/18 1924 - - - - - 98 %  09/29/18 1922 (!) 160/91 98.2 F (36.8 C) Oral 92 18 -  09/29/18 1919 - - - - - 97 %  09/29/18 1917 (!) 157/94 - - 93 - -  09/29/18 1914 - - - - - 98 %  09/29/18 1909 - - - - - 97 %  09/29/18 1904 - - - - - 97 %  09/29/18 1900 (!) 147/92 - - 91 - -  09/29/18 1849 (!) 153/88 - - 96 - 98 %  09/29/18 1844 - - - - - 99 %  09/29/18 1839 - - - - - 99 %  09/29/18 1836 (!) 163/91 - - 94 - -  09/29/18 1834 - - - - - 98 %  09/29/18 1829 - - - - - 99 %  09/29/18 1824 - - - - - 99 %  09/29/18 1819 - - - - - 97 %  09/29/18 1818 (!) 162/90 - - 96 - -  09/29/18 1815 (!) 169/82 - - 93 - 95 %  09/29/18 1814 - - - - - 95 %  09/29/18 1809 - - - - - 96 %  09/29/18 1804 - - - - - 95 %  09/29/18 1800 (!) 161/94 - - 92 - -  09/29/18 1759 - - - - - 96 %  09/29/18 1754 - - - - - 96 %  09/29/18 1749 - - - - - 96 %  09/29/18 1745 (!) 150/94 - - 88 - -  09/29/18 1744 - - - - - 96 %  09/29/18 1739 - - - - - 98 %  09/29/18 1734 - - - - - 96 %  09/29/18 1730 (!) 148/92 - - 93 - -  09/29/18 1716 (!) 150/80 - - 92 - -  09/29/18 1714 - - - - - 99 %  09/29/18 1709 - - - - - 98 %  09/29/18 1704 - - - - - 98 %  09/29/18 1700 (!) 152/80 - - 88 - -  09/29/18 1649 - - - - - 97 %  09/29/18 1645 139/89 - - 97 - -  09/29/18 1644 - - - - - 97 %  09/29/18 1639 - - - - - 98 %  09/29/18 1629 (!) 143/92 - - 96 - 97 %  09/29/18 1628 (!) 145/95 - - 95 - -  09/29/18 1608 (!) 143/88 - - 100 18 -  09/29/18 1607 - - - - - 99 %  09/29/18 1606 - 98.1 F (36.7 C) - - - -    Physical Exam  Nursing note and vitals reviewed. Constitutional: She is oriented to person, place, and time. She appears well-developed and well-nourished.  HENT:  Head: Normocephalic and atraumatic.  Eyes: Pupils are equal, round,  and reactive to light.  Neck: Normal range of motion.  Cardiovascular: Normal rate, regular rhythm, normal heart sounds and intact distal pulses.  Respiratory: Effort normal and breath sounds normal.  GI: Soft. Bowel sounds are normal.  Genitourinary:    Genitourinary Comments: Pelvic not indicated   Musculoskeletal: Normal range of motion.        General: Edema (1+ pitting edema) present.  Neurological: She is alert and oriented to person, place, and time. She has normal reflexes.  Skin: Skin is warm and dry.  Psychiatric: She has a normal mood and affect. Her behavior is normal. Judgment and thought content normal.    NST - FHR: 130 bpm / moderate variability / accels present / decels absent / TOCO: rare UI noted   MAU Course  Procedures  MDM CCUA CBC CMP P/C Ratio Serial BP's  Received migraine headache cocktail (IVFs with benadryl 25 mg IVP, metoclopramide 10 mg IVP, Decadron 10 mg IVP) -- reports relief. Labetalol Protocol -- BP lowered  *Consult with Dr. Rosana Hoes @ 1905 - notified of patient's complaints, assessments, lab, NST results; H/A improved, tx plan d/c home with close F/U - ok to d/c home, agrees with plan -- orders received to increase Labetalol to 300 mg TID and have her F/U via video visit this week in the office.  Results for orders placed or performed during the hospital encounter of 09/29/18 (from the past 24 hour(s))  Urinalysis, Routine w reflex microscopic     Status: Abnormal   Collection Time: 09/29/18  4:22 PM  Result Value Ref Range   Color, Urine YELLOW YELLOW   APPearance HAZY (A) CLEAR   Specific Gravity, Urine 1.021 1.005 - 1.030   pH 7.0 5.0 - 8.0   Glucose, UA NEGATIVE NEGATIVE mg/dL   Hgb urine  dipstick NEGATIVE NEGATIVE   Bilirubin Urine NEGATIVE NEGATIVE   Ketones, ur NEGATIVE NEGATIVE mg/dL   Protein, ur NEGATIVE NEGATIVE mg/dL   Nitrite NEGATIVE NEGATIVE   Leukocytes,Ua NEGATIVE NEGATIVE  Protein / creatinine ratio, urine     Status:  None   Collection Time: 09/29/18  4:22 PM  Result Value Ref Range   Creatinine, Urine 214.38 mg/dL   Total Protein, Urine 12 mg/dL   Protein Creatinine Ratio 0.06 0.00 - 0.15 mg/mg[Cre]  CBC     Status: Abnormal   Collection Time: 09/29/18  4:54 PM  Result Value Ref Range   WBC 15.5 (H) 4.0 - 10.5 K/uL   RBC 4.40 3.87 - 5.11 MIL/uL   Hemoglobin 10.8 (L) 12.0 - 15.0 g/dL   HCT 34.8 (L) 36.0 - 46.0 %   MCV 79.1 (L) 80.0 - 100.0 fL   MCH 24.5 (L) 26.0 - 34.0 pg   MCHC 31.0 30.0 - 36.0 g/dL   RDW 12.4 11.5 - 15.5 %   Platelets 279 150 - 400 K/uL   nRBC 0.0 0.0 - 0.2 %  Comprehensive metabolic panel     Status: Abnormal   Collection Time: 09/29/18  4:54 PM  Result Value Ref Range   Sodium 135 135 - 145 mmol/L   Potassium 3.8 3.5 - 5.1 mmol/L   Chloride 104 98 - 111 mmol/L   CO2 21 (L) 22 - 32 mmol/L   Glucose, Bld 77 70 - 99 mg/dL   BUN 5 (L) 6 - 20 mg/dL   Creatinine, Ser 0.57 0.44 - 1.00 mg/dL   Calcium 9.2 8.9 - 10.3 mg/dL   Total Protein 6.1 (L) 6.5 - 8.1 g/dL   Albumin 2.9 (L) 3.5 - 5.0 g/dL   AST 16 15 - 41 U/L   ALT 15 0 - 44 U/L   Alkaline Phosphatase 70 38 - 126 U/L   Total Bilirubin 0.4 0.3 - 1.2 mg/dL   GFR calc non Af Amer >60 >60 mL/min   GFR calc Af Amer >60 >60 mL/min   Anion gap 10 5 - 15     Assessment and Plan  Headache in pregnancy, antepartum, third trimester  Chronic hypertension in pregnancy  - Rx Labetalol 300 mg TID  - Information provided on general H/A, HTN in pregnancy & PEC  - Discharge patient - Msg to [place] pool to get [facility] video visit this week - Patient verbalized an understanding of the plan of care and agrees.     Laury Deep, MSN, CNM 09/29/2018, 4:33 PM

## 2018-09-29 NOTE — MAU Note (Signed)
.   Jenna Stephens is a 43 y.o. at [redacted]w[redacted]d here in MAU reporting:that her blood pressure at home was elevated. PT reports lower abdominal cramping  Onset of complaint:today Pain score: 6  Vitals:   09/29/18 1607 09/29/18 1608  BP:  (!) 143/88  Pulse:  100  Resp:  18  Temp:    SpO2: 99%      FHT:135 Lab orders placed from triage: UA

## 2018-09-30 ENCOUNTER — Telehealth: Payer: Self-pay | Admitting: Family Medicine

## 2018-09-30 NOTE — Telephone Encounter (Signed)
Spoke with patient about having MyChart app for her visit tomorrow. She has app downloaded.

## 2018-10-01 ENCOUNTER — Inpatient Hospital Stay (HOSPITAL_COMMUNITY)
Admission: AD | Admit: 2018-10-01 | Discharge: 2018-10-01 | Disposition: A | Discharge status: Home / Self Care | Payer: Medicaid Other | Attending: Obstetrics & Gynecology | Admitting: Obstetrics & Gynecology

## 2018-10-01 ENCOUNTER — Other Ambulatory Visit: Payer: Self-pay

## 2018-10-01 ENCOUNTER — Encounter (HOSPITAL_COMMUNITY): Payer: Self-pay | Admitting: *Deleted

## 2018-10-01 ENCOUNTER — Telehealth (INDEPENDENT_AMBULATORY_CARE_PROVIDER_SITE_OTHER): Payer: Medicaid Other | Admitting: Obstetrics & Gynecology

## 2018-10-01 VITALS — BP 171/118 | HR 100

## 2018-10-01 DIAGNOSIS — O24419 Gestational diabetes mellitus in pregnancy, unspecified control: Secondary | ICD-10-CM

## 2018-10-01 DIAGNOSIS — O0990 Supervision of high risk pregnancy, unspecified, unspecified trimester: Secondary | ICD-10-CM

## 2018-10-01 DIAGNOSIS — O10013 Pre-existing essential hypertension complicating pregnancy, third trimester: Secondary | ICD-10-CM | POA: Diagnosis not present

## 2018-10-01 DIAGNOSIS — O26893 Other specified pregnancy related conditions, third trimester: Secondary | ICD-10-CM

## 2018-10-01 DIAGNOSIS — R51 Headache: Secondary | ICD-10-CM | POA: Diagnosis not present

## 2018-10-01 DIAGNOSIS — R030 Elevated blood-pressure reading, without diagnosis of hypertension: Secondary | ICD-10-CM | POA: Diagnosis present

## 2018-10-01 DIAGNOSIS — O24410 Gestational diabetes mellitus in pregnancy, diet controlled: Secondary | ICD-10-CM | POA: Diagnosis not present

## 2018-10-01 DIAGNOSIS — Z3689 Encounter for other specified antenatal screening: Secondary | ICD-10-CM | POA: Diagnosis not present

## 2018-10-01 DIAGNOSIS — Z3A31 31 weeks gestation of pregnancy: Secondary | ICD-10-CM | POA: Diagnosis not present

## 2018-10-01 DIAGNOSIS — Z87891 Personal history of nicotine dependence: Secondary | ICD-10-CM | POA: Insufficient documentation

## 2018-10-01 DIAGNOSIS — O10919 Unspecified pre-existing hypertension complicating pregnancy, unspecified trimester: Secondary | ICD-10-CM

## 2018-10-01 DIAGNOSIS — R519 Headache, unspecified: Secondary | ICD-10-CM

## 2018-10-01 DIAGNOSIS — I10 Essential (primary) hypertension: Secondary | ICD-10-CM

## 2018-10-01 DIAGNOSIS — O10913 Unspecified pre-existing hypertension complicating pregnancy, third trimester: Secondary | ICD-10-CM

## 2018-10-01 HISTORY — DX: Gestational diabetes mellitus in pregnancy, unspecified control: O24.419

## 2018-10-01 LAB — URINALYSIS, ROUTINE W REFLEX MICROSCOPIC
Bilirubin Urine: NEGATIVE
Glucose, UA: NEGATIVE mg/dL
Hgb urine dipstick: NEGATIVE
Ketones, ur: NEGATIVE mg/dL
Nitrite: NEGATIVE
Protein, ur: NEGATIVE mg/dL
Specific Gravity, Urine: 1.023 (ref 1.005–1.030)
pH: 6 (ref 5.0–8.0)

## 2018-10-01 LAB — COMPREHENSIVE METABOLIC PANEL
ALT: 13 U/L (ref 0–44)
AST: 19 U/L (ref 15–41)
Albumin: 2.7 g/dL — ABNORMAL LOW (ref 3.5–5.0)
Alkaline Phosphatase: 58 U/L (ref 38–126)
Anion gap: 9 (ref 5–15)
BUN: 7 mg/dL (ref 6–20)
CO2: 21 mmol/L — ABNORMAL LOW (ref 22–32)
Calcium: 8.5 mg/dL — ABNORMAL LOW (ref 8.9–10.3)
Chloride: 107 mmol/L (ref 98–111)
Creatinine, Ser: 0.56 mg/dL (ref 0.44–1.00)
GFR calc Af Amer: >60 mL/min (ref >60)
GFR calc non Af Amer: >60 mL/min (ref >60)
Glucose, Bld: 82 mg/dL (ref 70–99)
Potassium: 3.7 mmol/L (ref 3.5–5.1)
Sodium: 137 mmol/L (ref 135–145)
Total Bilirubin: 0.9 mg/dL (ref 0.3–1.2)
Total Protein: 5.5 g/dL — ABNORMAL LOW (ref 6.5–8.1)

## 2018-10-01 LAB — CBC WITH DIFFERENTIAL/PLATELET
Abs Immature Granulocytes: 0.19 10*3/uL — ABNORMAL HIGH (ref 0.00–0.07)
Basophils Absolute: 0 10*3/uL (ref 0.0–0.1)
Basophils Relative: 0 %
Eosinophils Absolute: 0.3 10*3/uL (ref 0.0–0.5)
Eosinophils Relative: 2 %
HCT: 32 % — ABNORMAL LOW (ref 36.0–46.0)
Hemoglobin: 9.8 g/dL — ABNORMAL LOW (ref 12.0–15.0)
Immature Granulocytes: 1 %
Lymphocytes Relative: 18 %
Lymphs Abs: 2.7 10*3/uL (ref 0.7–4.0)
MCH: 24.5 pg — ABNORMAL LOW (ref 26.0–34.0)
MCHC: 30.6 g/dL (ref 30.0–36.0)
MCV: 80 fL (ref 80.0–100.0)
Monocytes Absolute: 1.2 10*3/uL — ABNORMAL HIGH (ref 0.1–1.0)
Monocytes Relative: 8 %
Neutro Abs: 10.6 10*3/uL — ABNORMAL HIGH (ref 1.7–7.7)
Neutrophils Relative %: 71 %
Platelets: 272 10*3/uL (ref 150–400)
RBC: 4 MIL/uL (ref 3.87–5.11)
RDW: 12.5 % (ref 11.5–15.5)
WBC: 15 10*3/uL — ABNORMAL HIGH (ref 4.0–10.5)
nRBC: 0 % (ref 0.0–0.2)

## 2018-10-01 LAB — PROTEIN / CREATININE RATIO, URINE
Creatinine, Urine: 197.79 mg/dL
Protein Creatinine Ratio: 0.08 mg/mg{Cre} (ref 0.00–0.15)
Total Protein, Urine: 15 mg/dL

## 2018-10-01 MED ORDER — LACTATED RINGERS IV SOLN
Freq: Once | INTRAVENOUS | Status: AC
  Administered 2018-10-01: 11:00:00 via INTRAVENOUS

## 2018-10-01 MED ORDER — LABETALOL HCL 300 MG PO TABS: 600.0000 mg | ORAL_TABLET | Freq: Three times a day (TID) | ORAL | 2 refills | Status: DC

## 2018-10-01 MED ORDER — DIPHENHYDRAMINE HCL 50 MG/ML IJ SOLN
25.0000 mg | Freq: Once | INTRAMUSCULAR | Status: AC
  Administered 2018-10-01: 25 mg via INTRAVENOUS
  Filled 2018-10-01: qty 1

## 2018-10-01 MED ORDER — LABETALOL HCL 100 MG PO TABS
300.0000 mg | ORAL_TABLET | Freq: Once | ORAL | Status: AC
  Administered 2018-10-01: 300 mg via ORAL
  Filled 2018-10-01: qty 3

## 2018-10-01 MED ORDER — PROCHLORPERAZINE EDISYLATE 10 MG/2ML IJ SOLN
10.0000 mg | Freq: Once | INTRAMUSCULAR | Status: AC
  Administered 2018-10-01: 10 mg via INTRAVENOUS
  Filled 2018-10-01: qty 2

## 2018-10-01 NOTE — MAU Provider Note (Signed)
History     CSN: [id number]  Arrival date and time: 10/01/18 1014   First Provider Initiated Contact with Patient 10/01/18 1045      Chief Complaint  Patient presents with  . BP Evaluation   HPI Jenna Stephens is a 43 y.o. [id number] at 26w4dwho presents to MAU for evaluation after she reported elevated blood pressure and new onset headache during her televisit this morning.  Headache This is a new problem, onset this morning. She reports bilateral pain behind her eyes. She rates her pain as 6-7/10. Her pain does not radiate. She denies aggravating or alleviating factors.  She took 1g of Tylenol at 0700 this morning but did not experience relief. She denies visual disturbances, RUQ/upper abdominal pain, SOB, weakness, syncope and new onset swelling.  Chronic Hypertension Patient is on a regimen of Procardia XL 60 mg daily and Labetalol 300 mg TID. She reports taking these medications at 0700 this morning.  Patient denies vaginal bleeding, leaking of fluid, decreased fetal movement, fever, falls, or recent illness.    OB History    Gravida  6   Para  2   Term  2   Preterm  0   AB  3   Living  2     SAB  2   TAB  1   Ectopic  0   Multiple      Live Births  2           Past Medical History:  Diagnosis Date  . Anxiety   . Asthma   . Hypertension   . Iron deficiency anemia   . Obesity     Past Surgical History:  Procedure Laterality Date  . TONSILLECTOMY      Family History  Problem Relation Age of Onset  . Diabetes Mother   . Hypertension Mother     Social History   Tobacco Use  . Smoking status: Former Smoker    Packs/day: 0.25    Last attempt to quit: 01/06/2018    Years since quitting: 0.7  . Smokeless tobacco: Never Used  . Tobacco comment: stopped smoking after this hospital admission  Substance Use Topics  . Alcohol use: Not Currently    Comment: weekends only  . Drug use: Not Currently    Types: Marijuana    Comment: last use  September 24 2016    Allergies: No Known Allergies  Medications Prior to Admission  Medication Sig Dispense Refill Last Dose  . Accu-Chek FastClix Lancets MISC 1 Units by Percutaneous route 4 (four) times daily. 100 each 12 Taking  . ACCU-CHEK GUIDE test strip USE TO TEST BLOOD SUGAR LEVELS 4 TIMES DAILY 100 each 11 Taking  . albuterol (PROVENTIL HFA;VENTOLIN HFA) 108 (90 Base) MCG/ACT inhaler Inhale 2 puffs into the lungs every 6 (six) hours as needed for wheezing or shortness of breath. 1 Inhaler 2 Taking  . aspirin 81 MG chewable tablet Chew by mouth daily.   Taking  . Blood Glucose Monitoring Suppl (ACCU-CHEK NANO SMARTVIEW) w/Device KIT 1 kit by Subdermal route as directed. Check blood sugars for fasting, and two hours after breakfast, lunch and dinner (4 checks daily) 1 kit 0 Taking  . ferrous sulfate 325 (65 FE) MG tablet Take 1-2 tablets (325-650 mg total) by mouth daily with breakfast. 60 tablet 5 Taking  . fluticasone furoate-vilanterol (BREO ELLIPTA) 200-25 MCG/INH AEPB Inhale 1 puff into the lungs daily. 28 each 11 Taking  . insulin NPH Human (NOVOLIN N)  100 UNIT/ML injection Inject 0.1 mLs (10 Units total) into the skin at bedtime. 10 mL 3 Taking  . Insulin Syringe-Needle U-100 (SAFETY INSULIN SYRINGES) 30G X 5/16" 0.5 ML MISC Inject 1 Syringe into the skin as needed for up to 30 days. 100 each 6 Taking  . labetalol (NORMODYNE) 300 MG tablet Take 1 tablet (300 mg total) by mouth 3 (three) times daily. 90 tablet 2 Taking  . lansoprazole (PREVACID) 30 MG capsule Take 1 capsule (30 mg total) by mouth daily at 12 noon. 30 capsule 5 Taking  . montelukast (SINGULAIR) 10 MG tablet Take 1 tablet (10 mg total) by mouth daily. 30 tablet 2 Taking  . NIFEdipine (PROCARDIA-XL/NIFEDICAL-XL) 30 MG 24 hr tablet Take 2 tablets (60 mg total) by mouth daily. 60 tablet 2 Taking  . Prenatal Vit-Fe Fumarate-FA (PRENATAL MULTIVITAMIN) TABS tablet Take 1 tablet by mouth daily at 12 noon.   Taking  . promethazine  (PHENERGAN) 25 MG tablet Take 1 tablet by mouth 2 (two) times daily.   Taking  . umeclidinium bromide (INCRUSE ELLIPTA) 62.5 MCG/INH AEPB Inhale 1 puff into the lungs daily. 30 each 11 Taking  . zolpidem (AMBIEN) 10 MG tablet Take 10 mg by mouth at bedtime as needed for sleep.   Not Taking    Review of Systems  Constitutional: Negative for chills, fatigue and fever.  Respiratory: Negative for shortness of breath.   Cardiovascular: Negative for chest pain and leg swelling.  Gastrointestinal: Negative for abdominal pain.  Genitourinary: Negative for difficulty urinating, flank pain and vaginal bleeding.  Musculoskeletal: Negative for back pain.  Neurological: Positive for headaches. Negative for dizziness, syncope and weakness.  All other systems reviewed and are negative.  Physical Exam   Blood pressure (!) 156/98, pulse 93, resp. rate 20, height _0  (1.702 m), weight 128.1 kg, last menstrual period 02/22/2018, SpO2 96 %, unknown if currently breastfeeding.  Physical Exam  Nursing note and vitals reviewed. Constitutional: She is oriented to person, place, and time. She appears well-developed and well-nourished.  Cardiovascular: Normal rate, normal heart sounds and normal pulses.  Respiratory: Effort normal and breath sounds normal. No respiratory distress.  GI: Soft. She exhibits no distension. There is no abdominal tenderness. There is no rebound, no guarding and no CVA tenderness.  Gravid  Genitourinary:    Genitourinary Comments: Not assessed   Neurological: She is alert and oriented to person, place, and time. She has normal strength. No cranial nerve deficit or sensory deficit.  Skin: Skin is warm and dry.  Psychiatric: She has a normal mood and affect. Her behavior is normal. Judgment and thought content normal.    MAU Course/MDM  Procedures  --Prenatal records reviewed by me --Elevated BP in MAU, no severe range --Headache reduced from 7 to 2 with headache  cocktail --Reactive tracing: baseline 130, moderate variability, positive 15 x 15 accels, no decels --[place]: quiet --Dr. Harolyn Rutherford consulted to discuss plan of care, medication regimen --Daily Labetalol regimen increased to 600 mg TID per Dr. Harolyn Rutherford  Patient Vitals for the past 24 hrs:  BP Temp Temp src Pulse Resp SpO2 Height Weight  10/01/18 1235 - (!) 97.5 F (36.4 C) Oral - 18 - - -  10/01/18 1216 (!) 159/91 - - 90 - - - -  10/01/18 1213 - - - - - 95 % - -  10/01/18 1201 (!) 151/100 - - 100 - - - -  10/01/18 1131 (!) 157/98 - - 94 - - - -  10/01/18 1130 - - - - - 97 % - -  10/01/18 1116 (!) 147/88 - - 93 - - - -  10/01/18 1107 (!) 151/92 - - 92 - - - -  10/01/18 1033 (!) 156/98 - Oral 93 20 96 % _0  (1.702 m) 128.1 kg   Results for orders placed or performed during the hospital encounter of 10/01/18 (from the past 24 hour(s))  Urinalysis, Routine w reflex microscopic     Status: Abnormal   Collection Time: 10/01/18 10:36 AM  Result Value Ref Range   Color, Urine YELLOW YELLOW   APPearance HAZY (A) CLEAR   Specific Gravity, Urine 1.023 1.005 - 1.030   pH 6.0 5.0 - 8.0   Glucose, UA NEGATIVE NEGATIVE mg/dL   Hgb urine dipstick NEGATIVE NEGATIVE   Bilirubin Urine NEGATIVE NEGATIVE   Ketones, ur NEGATIVE NEGATIVE mg/dL   Protein, ur NEGATIVE NEGATIVE mg/dL   Nitrite NEGATIVE NEGATIVE   Leukocytes,Ua TRACE (A) NEGATIVE   RBC / HPF 0-5 0 - 5 RBC/hpf   WBC, UA 0-5 0 - 5 WBC/hpf   Bacteria, UA RARE (A) NONE SEEN   Squamous Epithelial / LPF 6-10 0 - 5   Mucus PRESENT   Protein / creatinine ratio, urine     Status: None   Collection Time: 10/01/18 10:45 AM  Result Value Ref Range   Creatinine, Urine 197.79 mg/dL   Total Protein, Urine 15 mg/dL   Protein Creatinine Ratio 0.08 0.00 - 0.15 mg/mg[Cre]  CBC with Differential/Platelet     Status: Abnormal   Collection Time: 10/01/18 11:28 AM  Result Value Ref Range   WBC 15.0 (H) 4.0 - 10.5 K/uL   RBC 4.00 3.87 - 5.11 MIL/uL    Hemoglobin 9.8 (L) 12.0 - 15.0 g/dL   HCT 32.0 (L) 36.0 - 46.0 %   MCV 80.0 80.0 - 100.0 fL   MCH 24.5 (L) 26.0 - 34.0 pg   MCHC 30.6 30.0 - 36.0 g/dL   RDW 12.5 11.5 - 15.5 %   Platelets 272 150 - 400 K/uL   nRBC 0.0 0.0 - 0.2 %   Neutrophils Relative % 71 %   Neutro Abs 10.6 (H) 1.7 - 7.7 K/uL   Lymphocytes Relative 18 %   Lymphs Abs 2.7 0.7 - 4.0 K/uL   Monocytes Relative 8 %   Monocytes Absolute 1.2 (H) 0.1 - 1.0 K/uL   Eosinophils Relative 2 %   Eosinophils Absolute 0.3 0.0 - 0.5 K/uL   Basophils Relative 0 %   Basophils Absolute 0.0 0.0 - 0.1 K/uL   Immature Granulocytes 1 %   Abs Immature Granulocytes 0.19 (H) 0.00 - 0.07 K/uL  Comprehensive metabolic panel     Status: Abnormal   Collection Time: 10/01/18 11:28 AM  Result Value Ref Range   Sodium 137 135 - 145 mmol/L   Potassium 3.7 3.5 - 5.1 mmol/L   Chloride 107 98 - 111 mmol/L   CO2 21 (L) 22 - 32 mmol/L   Glucose, Bld 82 70 - 99 mg/dL   BUN 7 6 - 20 mg/dL   Creatinine, Ser 0.56 0.44 - 1.00 mg/dL   Calcium 8.5 (L) 8.9 - 10.3 mg/dL   Total Protein 5.5 (L) 6.5 - 8.1 g/dL   Albumin 2.7 (L) 3.5 - 5.0 g/dL   AST 19 15 - 41 U/L   ALT 13 0 - 44 U/L   Alkaline Phosphatase 58 38 - 126 U/L   Total Bilirubin 0.9 0.3 -  1.2 mg/dL   GFR calc non Af Amer >60 >60 mL/min   GFR calc Af Amer >60 >60 mL/min   Anion gap 9 5 - 15   Meds ordered this encounter  Medications  . diphenhydrAMINE (BENADRYL) injection 25 mg  . prochlorperazine (COMPAZINE) injection 10 mg  . lactated ringers infusion  . labetalol (NORMODYNE) tablet 300 mg  . labetalol (NORMODYNE) 300 MG tablet    Sig: Take 2 tablets (600 mg total) by mouth 3 (three) times daily.    Dispense:  90 tablet    Refill:  2    Order Specific Question:   Supervising Provider    Answer:   Donnamae Jude [[id number]]    Assessment and Plan  --43 y.o. [id number] at [redacted]w[redacted]d --Reactive tracing --Chronic Hypertension, change in dosing as discussed above --Discharge home in stable  condition  F/U: Pt to have in-person blood pressure check in clinic tomorrow. CRio Verdewith request  SDarlina Rumpf CNM 10/01/2018, 1:58 PM

## 2018-10-01 NOTE — Patient Instructions (Signed)
Hypertension During Pregnancy ° °Hypertension is also called high blood pressure. High blood pressure means that the force of your blood moving in your body is too strong. When you are pregnant, this condition should be watched carefully. It can cause problems for you and your baby. °Follow these instructions at home: °Eating and drinking ° °· Drink enough fluid to keep your pee (urine) pale yellow. °· Avoid caffeine. °Lifestyle °· Do not use any products that contain nicotine or tobacco, such as cigarettes and e-cigarettes. If you need help quitting, ask your doctor. °· Do not use alcohol or drugs. °· Avoid stress. °· Rest and get plenty of sleep. °General instructions °· Take over-the-counter and prescription medicines only as told by your doctor. °· While lying down, lie on your left side. This keeps pressure off your major blood vessels. °· While sitting or lying down, raise (elevate) your feet. Try putting some pillows under your lower legs. °· Exercise regularly. Ask your doctor what kinds of exercise are best for you. °· Keep all prenatal and follow-up visits as told by your doctor. This is important. °Contact a doctor if: °· You have symptoms that your doctor told you to watch for, such as: °? Throwing up (vomiting). °? Feeling sick to your stomach (nausea). °? Headache. °Get help right away if you have: °· Very bad belly pain that does not get better with treatment. °· A very bad headache that does not get better. °· Throwing up that does not get better with treatment. °· Sudden, fast weight gain. °· Sudden swelling in your hands, ankles, or face. °· Bleeding from your vagina. °· Blood in your pee. °· Fewer movements from your baby than usual. °· Blurry vision. °· Double vision. °· Muscle twitching. °· Sudden muscle tightening (spasms). °· Trouble breathing. °· Blue fingernails or lips. °Summary °· Hypertension is also called high blood pressure. High blood pressure means that the force of your blood moving  in your body is too strong. °· When you are pregnant, this condition should be watched carefully. It can cause problems for you and your baby. °· Get help right away if you have symptoms that your doctor told you to watch for. °This information is not intended to replace advice given to you by your health care provider. Make sure you discuss any questions you have with your health care provider. °Document Released: 05/12/2010 Document Revised: 03/26/2017 Document Reviewed: 12/20/2015 °Elsevier Interactive Patient Education © 2019 Elsevier Inc. ° °

## 2018-10-01 NOTE — MAU Note (Signed)
Pt sent from MD office for BP evaluation.  Reports currently has H/A.  Denies visual disturbances & egastric pain.  Reports +FM.  No VB or LOF.

## 2018-10-01 NOTE — Progress Notes (Signed)
I connected with  Jenna Stephens on 10/01/18 at  8:55 AM EDT by telephone and verified that I am speaking with the correct person using two identifiers.   I discussed the limitations, risks, security and privacy concerns of performing an evaluation and management service by telephone and the availability of in person appointments. I also discussed with the patient that there may be a patient responsible charge related to this service. The patient expressed understanding and agreed to proceed.  Dolores Hoose, RN 10/01/2018  9:11 AM

## 2018-10-01 NOTE — Progress Notes (Signed)
   TELEHEALTH VIRTUAL OBSTETRICS VISIT ENCOUNTER NOTE  I connected with Jenna Stephens on 10/01/18 at  8:55 AM EDT by telephone at home and verified that I am speaking with the correct person using two identifiers.   I discussed the limitations, risks, security and privacy concerns of performing an evaluation and management service by telephone and the availability of in person appointments. I also discussed with the patient that there may be a patient responsible charge related to this service. The patient expressed understanding and agreed to proceed.  Subjective:  Jenna Stephens is a 43 y.o. [id number] at [redacted]w[redacted]d being followed for ongoing prenatal care.  She is currently monitored for the following issues for this high-risk pregnancy and has Acute respiratory failure ([place]); HTN (hypertension); Obesity; Asthma with status asthmaticus; Dyspnea; Severe persistent asthma without complication; Asthma exacerbation; Leukocytosis; Chest tightness; Hypokalemia; GERD (gastroesophageal reflux disease); Supervision of high risk pregnancy, antepartum; AMA (advanced maternal age) multigravida 68+, unspecified trimester; Chronic hypertension in pregnancy; Gestational diabetes mellitus (GDM) affecting pregnancy, antepartum; and Headache in pregnancy, antepartum, third trimester on their problem list.  Patient reports headache. Reports fetal movement. Denies any contractions, bleeding or leaking of fluid.   The following portions of the patient's history were reviewed and updated as appropriate: allergies, current medications, past family history, past medical history, past social history, past surgical history and problem list.   Objective:   General:  Alert, oriented and cooperative.   Mental Status: Normal mood and affect perceived. Normal judgment and thought content.  Rest of physical exam deferred due to type of encounter  Assessment and Plan:  Pregnancy: [id number] at [redacted]w[redacted]d 1. Headache in pregnancy,  antepartum, third trimester HTN, suspect superimposed preeclampsia with severe features, instructed to proceed to [facility]U at [facility]  2. Gestational diabetes mellitus (GDM) affecting pregnancy, antepartum Using insulin  3. Supervision of high risk pregnancy, antepartum   4. Chronic hypertension in pregnancy Severe htn today  Preterm labor symptoms and general obstetric precautions including but not limited to vaginal bleeding, contractions, leaking of fluid and fetal movement were reviewed in detail with the patient.  I discussed the assessment and treatment plan with the patient. The patient was provided an opportunity to ask questions and all were answered. The patient agreed with the plan and demonstrated an understanding of the instructions. The patient was advised to call back or seek an in-person office evaluation/go to MAU at [facility] for any urgent or concerning symptoms. Please refer to After Visit Summary for other counseling recommendations.   I provided 10 minutes of non-face-to-face time during this encounter.  No follow-ups on file.  Future Appointments  Date Time Provider [place]  10/15/2018  9:30 AM [place] [facility]  10/15/2018  9:30 AM [facility]-MFC [place] 1 [facility]-MFC[facility] MFC-US    Emeterio Reeve, [place] for [place], [place]

## 2018-10-01 NOTE — Discharge Instructions (Signed)
Hypertension During Pregnancy ° °Hypertension is also called high blood pressure. High blood pressure means that the force of your blood moving in your body is too strong. When you are pregnant, this condition should be watched carefully. It can cause problems for you and your baby. °Follow these instructions at home: °Eating and drinking ° °· Drink enough fluid to keep your pee (urine) pale yellow. °· Avoid caffeine. °Lifestyle °· Do not use any products that contain nicotine or tobacco, such as cigarettes and e-cigarettes. If you need help quitting, ask your doctor. °· Do not use alcohol or drugs. °· Avoid stress. °· Rest and get plenty of sleep. °General instructions °· Take over-the-counter and prescription medicines only as told by your doctor. °· While lying down, lie on your left side. This keeps pressure off your major blood vessels. °· While sitting or lying down, raise (elevate) your feet. Try putting some pillows under your lower legs. °· Exercise regularly. Ask your doctor what kinds of exercise are best for you. °· Keep all prenatal and follow-up visits as told by your doctor. This is important. °Contact a doctor if: °· You have symptoms that your doctor told you to watch for, such as: °? Throwing up (vomiting). °? Feeling sick to your stomach (nausea). °? Headache. °Get help right away if you have: °· Very bad belly pain that does not get better with treatment. °· A very bad headache that does not get better. °· Throwing up that does not get better with treatment. °· Sudden, fast weight gain. °· Sudden swelling in your hands, ankles, or face. °· Bleeding from your vagina. °· Blood in your pee. °· Fewer movements from your baby than usual. °· Blurry vision. °· Double vision. °· Muscle twitching. °· Sudden muscle tightening (spasms). °· Trouble breathing. °· Blue fingernails or lips. °Summary °· Hypertension is also called high blood pressure. High blood pressure means that the force of your blood moving  in your body is too strong. °· When you are pregnant, this condition should be watched carefully. It can cause problems for you and your baby. °· Get help right away if you have symptoms that your doctor told you to watch for. °This information is not intended to replace advice given to you by your health care provider. Make sure you discuss any questions you have with your health care provider. °Document Released: 05/12/2010 Document Revised: 03/26/2017 Document Reviewed: 12/20/2015 °Elsevier Interactive Patient Education © 2019 Elsevier Inc. ° °

## 2018-10-02 ENCOUNTER — Telehealth: Payer: Self-pay | Admitting: Family Medicine

## 2018-10-02 NOTE — Telephone Encounter (Signed)
Spoke with patient about her appointment on 6/12 @ 8:50. Patient was instructed to wear a face mask for the entire about and no visitors are allowed with her. Patient was screened for covid symptoms and denied having any.

## 2018-10-03 ENCOUNTER — Inpatient Hospital Stay (HOSPITAL_COMMUNITY): Payer: Medicaid Other

## 2018-10-03 ENCOUNTER — Other Ambulatory Visit: Payer: Self-pay

## 2018-10-03 ENCOUNTER — Ambulatory Visit (INDEPENDENT_AMBULATORY_CARE_PROVIDER_SITE_OTHER): Payer: Medicaid Other | Admitting: *Deleted

## 2018-10-03 ENCOUNTER — Inpatient Hospital Stay (HOSPITAL_COMMUNITY)
Admission: AD | Admit: 2018-10-03 | Discharge: 2018-10-09 | DRG: 798 | Disposition: A | Discharge status: Home / Self Care | Payer: Medicaid Other | Attending: Obstetrics and Gynecology | Admitting: Obstetrics and Gynecology

## 2018-10-03 ENCOUNTER — Encounter (HOSPITAL_COMMUNITY): Payer: Self-pay

## 2018-10-03 VITALS — BP 164/103 | HR 98 | Temp 98.8°F | Wt 283.0 lb

## 2018-10-03 DIAGNOSIS — Z87891 Personal history of nicotine dependence: Secondary | ICD-10-CM

## 2018-10-03 DIAGNOSIS — O1414 Severe pre-eclampsia complicating childbirth: Secondary | ICD-10-CM | POA: Diagnosis not present

## 2018-10-03 DIAGNOSIS — O0990 Supervision of high risk pregnancy, unspecified, unspecified trimester: Secondary | ICD-10-CM

## 2018-10-03 DIAGNOSIS — O1410 Severe pre-eclampsia, unspecified trimester: Secondary | ICD-10-CM | POA: Diagnosis present

## 2018-10-03 DIAGNOSIS — O9902 Anemia complicating childbirth: Secondary | ICD-10-CM | POA: Diagnosis present

## 2018-10-03 DIAGNOSIS — O99213 Obesity complicating pregnancy, third trimester: Secondary | ICD-10-CM | POA: Diagnosis not present

## 2018-10-03 DIAGNOSIS — O24424 Gestational diabetes mellitus in childbirth, insulin controlled: Secondary | ICD-10-CM | POA: Diagnosis present

## 2018-10-03 DIAGNOSIS — J45909 Unspecified asthma, uncomplicated: Secondary | ICD-10-CM

## 2018-10-03 DIAGNOSIS — O10913 Unspecified pre-existing hypertension complicating pregnancy, third trimester: Secondary | ICD-10-CM

## 2018-10-03 DIAGNOSIS — O9989 Other specified diseases and conditions complicating pregnancy, childbirth and the puerperium: Secondary | ICD-10-CM | POA: Diagnosis not present

## 2018-10-03 DIAGNOSIS — O9952 Diseases of the respiratory system complicating childbirth: Secondary | ICD-10-CM | POA: Diagnosis present

## 2018-10-03 DIAGNOSIS — O09523 Supervision of elderly multigravida, third trimester: Secondary | ICD-10-CM

## 2018-10-03 DIAGNOSIS — O09529 Supervision of elderly multigravida, unspecified trimester: Secondary | ICD-10-CM

## 2018-10-03 DIAGNOSIS — O113 Pre-existing hypertension with pre-eclampsia, third trimester: Secondary | ICD-10-CM

## 2018-10-03 DIAGNOSIS — O10919 Unspecified pre-existing hypertension complicating pregnancy, unspecified trimester: Secondary | ICD-10-CM

## 2018-10-03 DIAGNOSIS — O114 Pre-existing hypertension with pre-eclampsia, complicating childbirth: Secondary | ICD-10-CM | POA: Diagnosis present

## 2018-10-03 DIAGNOSIS — O1413 Severe pre-eclampsia, third trimester: Secondary | ICD-10-CM

## 2018-10-03 DIAGNOSIS — O24419 Gestational diabetes mellitus in pregnancy, unspecified control: Secondary | ICD-10-CM

## 2018-10-03 DIAGNOSIS — Z302 Encounter for sterilization: Secondary | ICD-10-CM | POA: Diagnosis present

## 2018-10-03 DIAGNOSIS — I10 Essential (primary) hypertension: Secondary | ICD-10-CM | POA: Diagnosis present

## 2018-10-03 DIAGNOSIS — Z3A32 32 weeks gestation of pregnancy: Secondary | ICD-10-CM | POA: Diagnosis not present

## 2018-10-03 DIAGNOSIS — O3413 Maternal care for benign tumor of corpus uteri, third trimester: Secondary | ICD-10-CM | POA: Diagnosis not present

## 2018-10-03 DIAGNOSIS — O9962 Diseases of the digestive system complicating childbirth: Secondary | ICD-10-CM | POA: Diagnosis present

## 2018-10-03 DIAGNOSIS — O99214 Obesity complicating childbirth: Secondary | ICD-10-CM | POA: Diagnosis present

## 2018-10-03 DIAGNOSIS — Z3A31 31 weeks gestation of pregnancy: Secondary | ICD-10-CM | POA: Diagnosis not present

## 2018-10-03 DIAGNOSIS — F1290 Cannabis use, unspecified, uncomplicated: Secondary | ICD-10-CM | POA: Diagnosis present

## 2018-10-03 DIAGNOSIS — Z0131 Encounter for examination of blood pressure with abnormal findings: Secondary | ICD-10-CM

## 2018-10-03 DIAGNOSIS — O1002 Pre-existing essential hypertension complicating childbirth: Secondary | ICD-10-CM | POA: Diagnosis present

## 2018-10-03 DIAGNOSIS — O99824 Streptococcus B carrier state complicating childbirth: Secondary | ICD-10-CM | POA: Diagnosis present

## 2018-10-03 DIAGNOSIS — O99324 Drug use complicating childbirth: Secondary | ICD-10-CM | POA: Diagnosis present

## 2018-10-03 DIAGNOSIS — Z1159 Encounter for screening for other viral diseases: Secondary | ICD-10-CM | POA: Diagnosis not present

## 2018-10-03 DIAGNOSIS — K219 Gastro-esophageal reflux disease without esophagitis: Secondary | ICD-10-CM | POA: Diagnosis present

## 2018-10-03 DIAGNOSIS — D649 Anemia, unspecified: Secondary | ICD-10-CM | POA: Diagnosis present

## 2018-10-03 DIAGNOSIS — O119 Pre-existing hypertension with pre-eclampsia, unspecified trimester: Secondary | ICD-10-CM | POA: Diagnosis present

## 2018-10-03 DIAGNOSIS — J4550 Severe persistent asthma, uncomplicated: Secondary | ICD-10-CM | POA: Diagnosis present

## 2018-10-03 DIAGNOSIS — O26893 Other specified pregnancy related conditions, third trimester: Secondary | ICD-10-CM | POA: Diagnosis present

## 2018-10-03 DIAGNOSIS — E669 Obesity, unspecified: Secondary | ICD-10-CM | POA: Diagnosis present

## 2018-10-03 DIAGNOSIS — O361990 Maternal care for other isoimmunization, unspecified trimester, not applicable or unspecified: Secondary | ICD-10-CM | POA: Diagnosis present

## 2018-10-03 LAB — CBC
HCT: 33.5 % — ABNORMAL LOW (ref 36.0–46.0)
Hemoglobin: 10.3 g/dL — ABNORMAL LOW (ref 12.0–15.0)
MCH: 24.6 pg — ABNORMAL LOW (ref 26.0–34.0)
MCHC: 30.7 g/dL (ref 30.0–36.0)
MCV: 80 fL (ref 80.0–100.0)
Platelets: 269 10*3/uL (ref 150–400)
RBC: 4.19 MIL/uL (ref 3.87–5.11)
RDW: 12.6 % (ref 11.5–15.5)
WBC: 15.5 10*3/uL — ABNORMAL HIGH (ref 4.0–10.5)
nRBC: 0 % (ref 0.0–0.2)

## 2018-10-03 LAB — URINALYSIS, ROUTINE W REFLEX MICROSCOPIC
Bilirubin Urine: NEGATIVE
Glucose, UA: NEGATIVE mg/dL
Hgb urine dipstick: NEGATIVE
Ketones, ur: NEGATIVE mg/dL
Nitrite: NEGATIVE
Protein, ur: NEGATIVE mg/dL
Specific Gravity, Urine: 1.023 (ref 1.005–1.030)
pH: 5 (ref 5.0–8.0)

## 2018-10-03 LAB — GLUCOSE, CAPILLARY
Glucose-Capillary: 126 mg/dL — ABNORMAL HIGH (ref 70–99)
Glucose-Capillary: 144 mg/dL — ABNORMAL HIGH (ref 70–99)

## 2018-10-03 LAB — COMPREHENSIVE METABOLIC PANEL
ALT: 13 U/L (ref 0–44)
AST: 14 U/L — ABNORMAL LOW (ref 15–41)
Albumin: 2.9 g/dL — ABNORMAL LOW (ref 3.5–5.0)
Alkaline Phosphatase: 63 U/L (ref 38–126)
Anion gap: 10 (ref 5–15)
BUN: 5 mg/dL — ABNORMAL LOW (ref 6–20)
CO2: 20 mmol/L — ABNORMAL LOW (ref 22–32)
Calcium: 8.7 mg/dL — ABNORMAL LOW (ref 8.9–10.3)
Chloride: 106 mmol/L (ref 98–111)
Creatinine, Ser: 0.56 mg/dL (ref 0.44–1.00)
GFR calc Af Amer: >60 mL/min (ref >60)
GFR calc non Af Amer: >60 mL/min (ref >60)
Glucose, Bld: 99 mg/dL (ref 70–99)
Potassium: 3.6 mmol/L (ref 3.5–5.1)
Sodium: 136 mmol/L (ref 135–145)
Total Bilirubin: 0.4 mg/dL (ref 0.3–1.2)
Total Protein: 5.9 g/dL — ABNORMAL LOW (ref 6.5–8.1)

## 2018-10-03 LAB — RAPID URINE DRUG SCREEN, HOSP PERFORMED
Amphetamines: NOT DETECTED
Barbiturates: NOT DETECTED
Benzodiazepines: NOT DETECTED
Cocaine: NOT DETECTED
Opiates: NOT DETECTED
Tetrahydrocannabinol: POSITIVE — AB

## 2018-10-03 LAB — SARS CORONAVIRUS 2: SARS Coronavirus 2: NOT DETECTED

## 2018-10-03 LAB — PROTEIN / CREATININE RATIO, URINE
Creatinine, Urine: 238.15 mg/dL
Protein Creatinine Ratio: 0.07 mg/mg{Cre} (ref 0.00–0.15)
Total Protein, Urine: 17 mg/dL

## 2018-10-03 MED ORDER — ZOLPIDEM TARTRATE 5 MG PO TABS
5.0000 mg | ORAL_TABLET | Freq: Every evening | ORAL | Status: DC | PRN
  Filled 2018-10-03: qty 1

## 2018-10-03 MED ORDER — LABETALOL HCL 5 MG/ML IV SOLN: 40.0000 mg | INTRAVENOUS | Status: DC | PRN

## 2018-10-03 MED ORDER — ZOLPIDEM TARTRATE 5 MG PO TABS: 5.0000 mg | ORAL_TABLET | Freq: Every evening | ORAL | Status: DC | PRN

## 2018-10-03 MED ORDER — PROMETHAZINE HCL 25 MG/ML IJ SOLN
25.0000 mg | Freq: Four times a day (QID) | INTRAMUSCULAR | Status: DC | PRN
  Administered 2018-10-03: 18:00:00 25 mg via INTRAVENOUS
  Filled 2018-10-03: qty 1

## 2018-10-03 MED ORDER — BUTALBITAL-APAP-CAFFEINE 50-325-40 MG PO TABS
2.0000 | ORAL_TABLET | Freq: Once | ORAL | Status: AC
  Administered 2018-10-03: 10:00:00 2 via ORAL
  Filled 2018-10-03: qty 2

## 2018-10-03 MED ORDER — FERROUS SULFATE 325 (65 FE) MG PO TABS
325.0000 mg | ORAL_TABLET | Freq: Two times a day (BID) | ORAL | Status: DC
  Administered 2018-10-03 – 2018-10-05 (×4): 325 mg via ORAL
  Filled 2018-10-03 (×8): qty 1

## 2018-10-03 MED ORDER — MAGNESIUM SULFATE BOLUS VIA INFUSION
4.0000 g | Freq: Once | INTRAVENOUS | Status: AC
  Administered 2018-10-03: 17:00:00 4 g via INTRAVENOUS
  Filled 2018-10-03: qty 500

## 2018-10-03 MED ORDER — NIFEDIPINE ER OSMOTIC RELEASE 60 MG PO TB24
60.0000 mg | ORAL_TABLET | Freq: Two times a day (BID) | ORAL | Status: DC
  Administered 2018-10-03 – 2018-10-06 (×7): 60 mg via ORAL
  Filled 2018-10-03: qty 1
  Filled 2018-10-03: qty 2
  Filled 2018-10-03: qty 1
  Filled 2018-10-03 (×2): qty 2
  Filled 2018-10-03: qty 1
  Filled 2018-10-03: qty 2
  Filled 2018-10-03 (×2): qty 1

## 2018-10-03 MED ORDER — UMECLIDINIUM BROMIDE 62.5 MCG/INH IN AEPB
1.0000 | INHALATION_SPRAY | Freq: Every day | RESPIRATORY_TRACT | Status: DC
  Administered 2018-10-04 – 2018-10-06 (×3): 1 via RESPIRATORY_TRACT
  Filled 2018-10-03: qty 7

## 2018-10-03 MED ORDER — ALBUTEROL SULFATE (2.5 MG/3ML) 0.083% IN NEBU
2.5000 mg | INHALATION_SOLUTION | Freq: Four times a day (QID) | RESPIRATORY_TRACT | Status: DC | PRN
  Administered 2018-10-03: 23:00:00 2.5 mg via RESPIRATORY_TRACT
  Filled 2018-10-03 (×2): qty 3

## 2018-10-03 MED ORDER — LABETALOL HCL 5 MG/ML IV SOLN: 20.0000 mg | INTRAVENOUS | Status: DC | PRN

## 2018-10-03 MED ORDER — INSULIN NPH (HUMAN) (ISOPHANE) 100 UNIT/ML ~~LOC~~ SUSP
10.0000 [IU] | Freq: Two times a day (BID) | SUBCUTANEOUS | Status: DC
  Administered 2018-10-03 – 2018-10-04 (×2): 10 [IU] via SUBCUTANEOUS
  Filled 2018-10-03: qty 10

## 2018-10-03 MED ORDER — HYDRALAZINE HCL 20 MG/ML IJ SOLN: 5.0000 mg | INTRAMUSCULAR | Status: DC | PRN

## 2018-10-03 MED ORDER — FLUTICASONE FUROATE-VILANTEROL 200-25 MCG/INH IN AEPB
1.0000 | INHALATION_SPRAY | Freq: Every day | RESPIRATORY_TRACT | Status: DC
  Administered 2018-10-04 – 2018-10-06 (×3): 1 via RESPIRATORY_TRACT
  Filled 2018-10-03: qty 28

## 2018-10-03 MED ORDER — NIFEDIPINE 10 MG PO CAPS: 20.0000 mg | ORAL_CAPSULE | ORAL | Status: DC | PRN

## 2018-10-03 MED ORDER — LACTATED RINGERS IV SOLN
INTRAVENOUS | Status: AC
  Administered 2018-10-03 – 2018-10-04 (×3): via INTRAVENOUS

## 2018-10-03 MED ORDER — LABETALOL HCL 200 MG PO TABS
800.0000 mg | ORAL_TABLET | Freq: Three times a day (TID) | ORAL | Status: DC
  Administered 2018-10-03 – 2018-10-06 (×10): 800 mg via ORAL
  Filled 2018-10-03 (×10): qty 4

## 2018-10-03 MED ORDER — PANTOPRAZOLE SODIUM 40 MG PO TBEC
40.0000 mg | DELAYED_RELEASE_TABLET | Freq: Every day | ORAL | Status: DC
  Administered 2018-10-04 – 2018-10-06 (×3): 40 mg via ORAL
  Filled 2018-10-03 (×3): qty 1

## 2018-10-03 MED ORDER — ASPIRIN 81 MG PO CHEW
81.0000 mg | CHEWABLE_TABLET | Freq: Every day | ORAL | Status: DC
  Administered 2018-10-04 – 2018-10-05 (×2): 81 mg via ORAL
  Filled 2018-10-03 (×2): qty 1

## 2018-10-03 MED ORDER — INSULIN ASPART 100 UNIT/ML ~~LOC~~ SOLN
0.0000 [IU] | Freq: Four times a day (QID) | SUBCUTANEOUS | Status: DC
  Administered 2018-10-03 – 2018-10-04 (×5): 2 [IU] via SUBCUTANEOUS
  Administered 2018-10-05: 12:00:00 1 [IU] via SUBCUTANEOUS

## 2018-10-03 MED ORDER — LACTATED RINGERS IV SOLN
INTRAVENOUS | Status: DC
  Administered 2018-10-03: 11:00:00 via INTRAVENOUS

## 2018-10-03 MED ORDER — CALCIUM CARBONATE ANTACID 500 MG PO CHEW: 2.0000 | CHEWABLE_TABLET | ORAL | Status: DC | PRN

## 2018-10-03 MED ORDER — LABETALOL HCL 5 MG/ML IV SOLN
20.0000 mg | INTRAVENOUS | Status: DC | PRN
  Administered 2018-10-03: 12:00:00 20 mg via INTRAVENOUS
  Filled 2018-10-03: qty 4

## 2018-10-03 MED ORDER — FERROUS SULFATE 325 (65 FE) MG PO TABS: 325.0000 mg | ORAL_TABLET | Freq: Two times a day (BID) | ORAL | Status: DC

## 2018-10-03 MED ORDER — LABETALOL HCL 100 MG PO TABS
600.0000 mg | ORAL_TABLET | Freq: Once | ORAL | Status: AC
  Administered 2018-10-03: 12:00:00 600 mg via ORAL
  Filled 2018-10-03: qty 6

## 2018-10-03 MED ORDER — PROMETHAZINE HCL 25 MG PO TABS
25.0000 mg | ORAL_TABLET | Freq: Four times a day (QID) | ORAL | Status: DC | PRN
  Filled 2018-10-03: qty 1

## 2018-10-03 MED ORDER — BETAMETHASONE SOD PHOS & ACET 6 (3-3) MG/ML IJ SUSP
12.0000 mg | INTRAMUSCULAR | Status: AC
  Administered 2018-10-03 – 2018-10-04 (×2): 12 mg via INTRAMUSCULAR
  Filled 2018-10-03 (×2): qty 2

## 2018-10-03 MED ORDER — MONTELUKAST SODIUM 10 MG PO TABS
10.0000 mg | ORAL_TABLET | Freq: Every day | ORAL | Status: DC
  Administered 2018-10-03 – 2018-10-06 (×3): 10 mg via ORAL
  Filled 2018-10-03 (×5): qty 1

## 2018-10-03 MED ORDER — DOCUSATE SODIUM 100 MG PO CAPS
100.0000 mg | ORAL_CAPSULE | Freq: Every day | ORAL | Status: DC
  Administered 2018-10-03 – 2018-10-05 (×3): 100 mg via ORAL
  Filled 2018-10-03 (×3): qty 1

## 2018-10-03 MED ORDER — PRENATAL MULTIVITAMIN CH
1.0000 | ORAL_TABLET | Freq: Every day | ORAL | Status: DC
  Administered 2018-10-04 – 2018-10-05 (×2): 1 via ORAL
  Filled 2018-10-03 (×2): qty 1

## 2018-10-03 MED ORDER — HYDRALAZINE HCL 20 MG/ML IJ SOLN
5.0000 mg | INTRAMUSCULAR | Status: DC | PRN
  Administered 2018-10-03: 11:00:00 5 mg via INTRAVENOUS
  Filled 2018-10-03: qty 1

## 2018-10-03 MED ORDER — NIFEDIPINE 10 MG PO CAPS
10.0000 mg | ORAL_CAPSULE | ORAL | Status: DC | PRN
  Administered 2018-10-03: 10:00:00 10 mg via ORAL
  Filled 2018-10-03: qty 1

## 2018-10-03 MED ORDER — HYDRALAZINE HCL 20 MG/ML IJ SOLN: 10.0000 mg | INTRAMUSCULAR | Status: DC | PRN

## 2018-10-03 MED ORDER — ENOXAPARIN SODIUM 60 MG/0.6ML ~~LOC~~ SOLN
60.0000 mg | SUBCUTANEOUS | Status: DC
  Administered 2018-10-03 – 2018-10-04 (×2): 60 mg via SUBCUTANEOUS
  Filled 2018-10-03 (×3): qty 0.6

## 2018-10-03 MED ORDER — HYDRALAZINE HCL 20 MG/ML IJ SOLN
10.0000 mg | INTRAMUSCULAR | Status: DC | PRN
  Administered 2018-10-03: 11:00:00 10 mg via INTRAVENOUS
  Filled 2018-10-03: qty 1

## 2018-10-03 MED ORDER — MAGNESIUM SULFATE 40 G IN LACTATED RINGERS - SIMPLE
2.0000 g/h | INTRAVENOUS | Status: AC
  Administered 2018-10-04: 05:00:00 2 g/h via INTRAVENOUS
  Filled 2018-10-03 (×2): qty 500

## 2018-10-03 MED ORDER — ACETAMINOPHEN 325 MG PO TABS
650.0000 mg | ORAL_TABLET | ORAL | Status: DC | PRN
  Administered 2018-10-04 – 2018-10-05 (×3): 650 mg via ORAL
  Filled 2018-10-03 (×3): qty 2

## 2018-10-03 MED ORDER — INSULIN NPH (HUMAN) (ISOPHANE) 100 UNIT/ML ~~LOC~~ SUSP: 10.0000 [IU] | Freq: Every day | SUBCUTANEOUS | Status: DC

## 2018-10-03 NOTE — Progress Notes (Addendum)
Pt O2 Sats are between 90-93% and has expiratory wheezes on auscultation. Respiratory called @2319  to give nebulizer treatment.  After nebulizer treatment O2 sat remain 92-93%. Dr Elonda Husky notified at 2349 and said RN could apply 2L Nasal Cannula if pt wakes up. Will continue to monitor.

## 2018-10-03 NOTE — Progress Notes (Signed)
Pt here for BP check. Pt reports having a headache - pain scale 5/10. Pt states she has a H/A every day. Tylenol does not ease the pain. "The only time I don't have a H/A is when I go to the hospital and they give me medicine."  Pt also reports having lightheadedness yesterday, none today. All other visual sx denied. She had MAU visit on 6/9 due to elevated BP @ home. Pt reports she slept most of the day yesterday and only took Labetalol twice. Encouraged pt to set alarm on her phone so that she can get all doses of medication daily.  Consult w/Dr. Hulan Fray following BP check x2. Advised that pt needs to go to MAU for further evaluation. Pt informed if plan of care recommendations. She voiced understanding and agreed to go to MAU after taking her son to work. Report called to [facility] RN.

## 2018-10-03 NOTE — Progress Notes (Signed)
Initial Nutrition Assessment  DOCUMENTATION CODES:  Morbid obesity  INTERVENTION:  CHO mod GDM diet Double protein portions, up to 60 g CHO at lunch and dinner Basic GDM diet educ reinforced  NUTRITION DIAGNOSIS:  Altered nutrition lab value related to (gestational diabetes) as evidenced by (elevated glucose levels). GOAL:  Other (Comment)(improved glycemic control)  MONITOR:  Labs  REASON FOR ASSESSMENT:  Gestational Diabetes, Antenatal   ASSESSMENT:   Adm at 31 6/7 weeks, with CHTN/PEC, GDM  Wt at initial PNV 129 Kg, BMI 43. No weight gain. Pt reports nausea throughout the day, not improved as pregnancy progresses. First meal of the day is typically at lunch time, then snacks throughout the rest of the day. Majority of snacks ar what she can tol w/o nausea, fruit, granola bars, bagels w/ cream cheese. Encouraged 3 balanced meals/day that include protein. OK to have breakfast foods at dinner. Provided pt with GDM diet handout that included CHO rec's per meal, as pt could not recall CHO goals  Diet Order:   Diet Order            Diet gestational carb mod Room service appropriate? Yes; Fluid consistency: Thin  Diet effective now             EDUCATION NEEDS:   Education needs have been addressed(Outpt educ occured on 09/09/2018)  Skin:  Skin Assessment: Reviewed RN Assessment  Height:   Ht Readings from Last 1 Encounters:  10/03/18 5\' 7"  (1.702 m)   Weight:   Wt Readings from Last 1 Encounters:  10/03/18 128.4 kg    Ideal Body Weight:   135 lbs  BMI:  Body mass index is 44.32 kg/m.  Estimated Nutritional Needs:   Kcal:  2300-2500  Protein:  100-110 g  Fluid:  2.6 L   Weyman Rodney M.Fredderick Severance LDN Neonatal Nutrition Support Specialist/RD III Pager [phone]      Phone [phone]

## 2018-10-03 NOTE — Progress Notes (Signed)
Call from Blood bank - pt. Is positive AntiLewis A & AntiLewis B antibody. 2 units have been cross matched for her at this time.

## 2018-10-03 NOTE — MAU Note (Signed)
Pt presents to MAU from OB office due to elevated blood pressures. She is being monitored due to chronic hyptertension and currently takes Labetalol and procardia (last dose 0615 today). Pt denies epigastric pain, visual changes and increased swelling. She is complaining of slight headache.

## 2018-10-03 NOTE — Consult Note (Signed)
[place] 10/03/2018    10:02 PM  Neonatal Medicine Consultation         Jenna Stephens          MRN:  [id number]  I was called at the request of the patient's obstetrician (Dr. Harolyn Rutherford) to speak to this patient due to potential premature delivery as early as 81 weeks due to gestational hypertension.  The patient's prenatal course includes chronic hypertension, gestational diabetes (rx with insulin), and recently identified superimposed pre-eclampsia (BP in severe range).  She is 31 6/7 weeks currently.  She is admitted to [place], and is receiving treatment that includes betamethasone, magnesium sulfate.  The baby is a female.  I reviewed expectations for a baby born at 67 weeks and beyond, including survival, length of stay, morbidities such as respiratory distress, infection, feeding intolerance.  I described how we provide respiratory and feeding support.  Mom plans to formula feed.  I let mom know that the baby's outlook generally improves the longer she remains undelivered.  I spent 30 minutes reviewing the record, speaking to the patient, and entering appropriate documentation.  About 50% of the time was spent face to face with patient.   _____________________ Roosevelt Locks, MD Neonatologist

## 2018-10-03 NOTE — MAU Provider Note (Signed)
History     CSN: [id number]  Arrival date and time: 10/03/18 9381   First Provider Initiated Contact with Patient 10/03/18 [phone]      Chief Complaint  Patient presents with  . Hypertension   Jenna Stephens is a 43 y.o. [id number] at 83w6dwho presents today with hypertension. She has CHTN and was seen here on 10/01/2018. Labetalol was increased to '600mg'$  TID. Patient states that she has been taking this as prescribed. She is also procardia '60mg'$  XL daily. She states that she has been taking this as prescribed as well. She was seen today for follow up and was hypertensive today. She states that she did not check her blood pressure at home, but that she "felt terrible" yesterday so she assumed it was high. She denies any contractions, VB or LOF. She reports normal fetal movement. She denies any visual disturbances or RUQ pain. She reports a headache today. She states that she has had a headache every day since the beginning of her pregnancy. She rates her headache 5/10. She reports that the headache is frontal and bilateral. This is typical of the headaches she has had. She states that when she comes here and the headache is treated it goes away.    OB History    Gravida  6   Para  2   Term  2   Preterm  0   AB  3   Living  2     SAB  2   TAB  1   Ectopic  0   Multiple      Live Births  2           Past Medical History:  Diagnosis Date  . Anxiety   . Asthma   . Gestational diabetes   . Hypertension   . Iron deficiency anemia   . Obesity     Past Surgical History:  Procedure Laterality Date  . TONSILLECTOMY      Family History  Problem Relation Age of Onset  . Diabetes Mother   . Hypertension Mother   . Hypertension Father   . COPD Father     Social History   Tobacco Use  . Smoking status: Former Smoker    Packs/day: 0.25    Types: Cigarettes    Quit date: 01/06/2018    Years since quitting: 0.7  . Smokeless tobacco: Never Used  . Tobacco comment:  stopped smoking after this hospital admission  Substance Use Topics  . Alcohol use: Not Currently    Comment: weekends only  . Drug use: Not Currently    Types: Marijuana    Comment: last smoked 09/26/2018    Allergies: No Known Allergies  Medications Prior to Admission  Medication Sig Dispense Refill Last Dose  . Accu-Chek FastClix Lancets MISC 1 Units by Percutaneous route 4 (four) times daily. 100 each 12   . ACCU-CHEK GUIDE test strip USE TO TEST BLOOD SUGAR LEVELS 4 TIMES DAILY 100 each 11   . albuterol (PROVENTIL HFA;VENTOLIN HFA) 108 (90 Base) MCG/ACT inhaler Inhale 2 puffs into the lungs every 6 (six) hours as needed for wheezing or shortness of breath. 1 Inhaler 2   . aspirin 81 MG chewable tablet Chew by mouth daily.     . Blood Glucose Monitoring Suppl (ACCU-CHEK NANO SMARTVIEW) w/Device KIT 1 kit by Subdermal route as directed. Check blood sugars for fasting, and two hours after breakfast, lunch and dinner (4 checks daily) 1 kit 0   .  ferrous sulfate 325 (65 FE) MG tablet Take 1-2 tablets (325-650 mg total) by mouth daily with breakfast. 60 tablet 5   . fluticasone furoate-vilanterol (BREO ELLIPTA) 200-25 MCG/INH AEPB Inhale 1 puff into the lungs daily. 28 each 11   . insulin NPH Human (NOVOLIN N) 100 UNIT/ML injection Inject 0.1 mLs (10 Units total) into the skin at bedtime. 10 mL 3   . Insulin Syringe-Needle U-100 (SAFETY INSULIN SYRINGES) 30G X 5/16" 0.5 ML MISC Inject 1 Syringe into the skin as needed for up to 30 days. 100 each 6   . labetalol (NORMODYNE) 300 MG tablet Take 2 tablets (600 mg total) by mouth 3 (three) times daily. 90 tablet 2   . lansoprazole (PREVACID) 30 MG capsule Take 1 capsule (30 mg total) by mouth daily at 12 noon. 30 capsule 5   . montelukast (SINGULAIR) 10 MG tablet Take 1 tablet (10 mg total) by mouth daily. 30 tablet 2   . NIFEdipine (PROCARDIA-XL/NIFEDICAL-XL) 30 MG 24 hr tablet Take 2 tablets (60 mg total) by mouth daily. 60 tablet 2   . Prenatal  Vit-Fe Fumarate-FA (PRENATAL MULTIVITAMIN) TABS tablet Take 1 tablet by mouth daily at 12 noon.     . promethazine (PHENERGAN) 25 MG tablet Take 1 tablet by mouth 2 (two) times daily.     Marland Kitchen umeclidinium bromide (INCRUSE ELLIPTA) 62.5 MCG/INH AEPB Inhale 1 puff into the lungs daily. 30 each 11   . zolpidem (AMBIEN) 10 MG tablet Take 10 mg by mouth at bedtime as needed for sleep.       Review of Systems  Constitutional: Negative for chills and fever.  Eyes: Negative for visual disturbance.  Respiratory: Negative for shortness of breath.   Gastrointestinal: Positive for nausea. Negative for abdominal pain and vomiting.  Genitourinary: Negative for pelvic pain, vaginal bleeding and vaginal discharge.  Neurological: Positive for headaches.   Physical Exam   Pulse (!) 106, temperature 97.8 F (36.6 C), temperature source Oral, resp. rate 18, weight 128.4 kg, last menstrual period 02/22/2018, SpO2 98 %, unknown if currently breastfeeding.  Physical Exam  Nursing note and vitals reviewed. Constitutional: She is oriented to person, place, and time. She appears well-developed and well-nourished. No distress.  HENT:  Head: Normocephalic.  Cardiovascular: Normal rate.  Respiratory: Effort normal.  GI: Soft. There is no abdominal tenderness. There is no rebound.  Musculoskeletal:        General: Edema (pedal and ankle edema ) present.  Neurological: She is alert and oriented to person, place, and time. She has normal reflexes.  Skin: Skin is warm and dry.  Psychiatric: She has a normal mood and affect.   NST:  Baseline: 150 Variability: moderate Accels: 15x15 Decels: none Toco: none   Results for orders placed or performed during the hospital encounter of 10/03/18 (from the past 24 hour(s))  Comprehensive metabolic panel     Status: Abnormal   Collection Time: 10/03/18 10:20 AM  Result Value Ref Range   Sodium 136 135 - 145 mmol/L   Potassium 3.6 3.5 - 5.1 mmol/L   Chloride 106 98 -  111 mmol/L   CO2 20 (L) 22 - 32 mmol/L   Glucose, Bld 99 70 - 99 mg/dL   BUN 5 (L) 6 - 20 mg/dL   Creatinine, Ser 0.56 0.44 - 1.00 mg/dL   Calcium 8.7 (L) 8.9 - 10.3 mg/dL   Total Protein 5.9 (L) 6.5 - 8.1 g/dL   Albumin 2.9 (L) 3.5 - 5.0 g/dL  AST 14 (L) 15 - 41 U/L   ALT 13 0 - 44 U/L   Alkaline Phosphatase 63 38 - 126 U/L   Total Bilirubin 0.4 0.3 - 1.2 mg/dL   GFR calc non Af Amer >60 >60 mL/min   GFR calc Af Amer >60 >60 mL/min   Anion gap 10 5 - 15  CBC     Status: Abnormal   Collection Time: 10/03/18 10:20 AM  Result Value Ref Range   WBC 15.5 (H) 4.0 - 10.5 K/uL   RBC 4.19 3.87 - 5.11 MIL/uL   Hemoglobin 10.3 (L) 12.0 - 15.0 g/dL   HCT 33.5 (L) 36.0 - 46.0 %   MCV 80.0 80.0 - 100.0 fL   MCH 24.6 (L) 26.0 - 34.0 pg   MCHC 30.7 30.0 - 36.0 g/dL   RDW 12.6 11.5 - 15.5 %   Platelets 269 150 - 400 K/uL   nRBC 0.0 0.0 - 0.2 %  Urinalysis, Routine w reflex microscopic     Status: Abnormal   Collection Time: 10/03/18 10:45 AM  Result Value Ref Range   Color, Urine YELLOW YELLOW   APPearance CLOUDY (A) CLEAR   Specific Gravity, Urine 1.023 1.005 - 1.030   pH 5.0 5.0 - 8.0   Glucose, UA NEGATIVE NEGATIVE mg/dL   Hgb urine dipstick NEGATIVE NEGATIVE   Bilirubin Urine NEGATIVE NEGATIVE   Ketones, ur NEGATIVE NEGATIVE mg/dL   Protein, ur NEGATIVE NEGATIVE mg/dL   Nitrite NEGATIVE NEGATIVE   Leukocytes,Ua MODERATE (A) NEGATIVE   RBC / HPF 6-10 0 - 5 RBC/hpf   WBC, UA 11-20 0 - 5 WBC/hpf   Bacteria, UA RARE (A) NONE SEEN   Squamous Epithelial / LPF 11-20 0 - 5   Mucus PRESENT   Protein / creatinine ratio, urine     Status: None   Collection Time: 10/03/18 10:45 AM  Result Value Ref Range   Creatinine, Urine 238.15 mg/dL   Total Protein, Urine 17 mg/dL   Protein Creatinine Ratio 0.07 0.00 - 0.15 mg/mg[Cre]    MAU Course  Procedures  MDM Patient initially got 65m procardia PO as she had severe range BP on arrival, and IV had not been placed.  Fioricet for headache   Hydralazine protocol started and patient received the entire protocol. Mid-day dose of labetalol given (6072mPO) Headache has resolved with fioricet  12:55 PM DW Dr. AnHarolyn Rutherfordwill admit for blood pressure management, BMZ and magnesium.   Assessment and Plan   CHTN with superimposed pre-eclampsia, with severe range blood pressure Admit to antenatal BMZ Magnesium Blood pressure management.    HeMarcille BuffyNP, CNM  10/03/18  1:02 PM

## 2018-10-03 NOTE — H&P (Signed)
History     CSN: [id number]  Arrival date and time: 10/03/18 3825   First Provider Initiated Contact with Patient 10/03/18 [phone]         Chief Complaint  Patient presents with  . Hypertension   Jenna Stephens is a 43 y.o. [id number] at 7w6dwho presents today with hypertension. She has CHTN and was seen here on 10/01/2018. Labetalol was increased to 6039mTID. Patient states that she has been taking this as prescribed. She is also procardia 6031mL daily. She states that she has been taking this as prescribed as well. She was seen today for follow up and was hypertensive today. She states that she did not check her blood pressure at home, but that she "felt terrible" yesterday so she assumed it was high. She denies any contractions, VB or LOF. She reports normal fetal movement. She denies any visual disturbances or RUQ pain. She reports a headache today. She states that she has had a headache every day since the beginning of her pregnancy. She rates her headache 5/10. She reports that the headache is frontal and bilateral. This is typical of the headaches she has had. She states that when she comes here and the headache is treated it goes away.            OB History    Gravida  6   Para  2   Term  2   Preterm  0   AB  3   Living  2     SAB  2   TAB  1   Ectopic  0   Multiple      Live Births  2               Past Medical History:  Diagnosis Date  . Anxiety   . Asthma   . Gestational diabetes   . Hypertension   . Iron deficiency anemia   . Obesity          Past Surgical History:  Procedure Laterality Date  . TONSILLECTOMY           Family History  Problem Relation Age of Onset  . Diabetes Mother   . Hypertension Mother   . Hypertension Father   . COPD Father     Social History        Tobacco Use  . Smoking status: Former Smoker    Packs/day: 0.25    Types: Cigarettes    Quit date: 01/06/2018    Years since  quitting: 0.7  . Smokeless tobacco: Never Used  . Tobacco comment: stopped smoking after this hospital admission  Substance Use Topics  . Alcohol use: Not Currently    Comment: weekends only  . Drug use: Not Currently    Types: Marijuana    Comment: last smoked 09/26/2018    Allergies: No Known Allergies         Medications Prior to Admission  Medication Sig Dispense Refill Last Dose  . Accu-Chek FastClix Lancets MISC 1 Units by Percutaneous route 4 (four) times daily. 100 each 12   . ACCU-CHEK GUIDE test strip USE TO TEST BLOOD SUGAR LEVELS 4 TIMES DAILY 100 each 11   . albuterol (PROVENTIL HFA;VENTOLIN HFA) 108 (90 Base) MCG/ACT inhaler Inhale 2 puffs into the lungs every 6 (six) hours as needed for wheezing or shortness of breath. 1 Inhaler 2   . aspirin 81 MG chewable tablet Chew by mouth daily.     . Blood  Glucose Monitoring Suppl (ACCU-CHEK NANO SMARTVIEW) w/Device KIT 1 kit by Subdermal route as directed. Check blood sugars for fasting, and two hours after breakfast, lunch and dinner (4 checks daily) 1 kit 0   . ferrous sulfate 325 (65 FE) MG tablet Take 1-2 tablets (325-650 mg total) by mouth daily with breakfast. 60 tablet 5   . fluticasone furoate-vilanterol (BREO ELLIPTA) 200-25 MCG/INH AEPB Inhale 1 puff into the lungs daily. 28 each 11   . insulin NPH Human (NOVOLIN N) 100 UNIT/ML injection Inject 0.1 mLs (10 Units total) into the skin at bedtime. 10 mL 3   . Insulin Syringe-Needle U-100 (SAFETY INSULIN SYRINGES) 30G X 5/16" 0.5 ML MISC Inject 1 Syringe into the skin as needed for up to 30 days. 100 each 6   . labetalol (NORMODYNE) 300 MG tablet Take 2 tablets (600 mg total) by mouth 3 (three) times daily. 90 tablet 2   . lansoprazole (PREVACID) 30 MG capsule Take 1 capsule (30 mg total) by mouth daily at 12 noon. 30 capsule 5   . montelukast (SINGULAIR) 10 MG tablet Take 1 tablet (10 mg total) by mouth daily. 30 tablet 2   . NIFEdipine  (PROCARDIA-XL/NIFEDICAL-XL) 30 MG 24 hr tablet Take 2 tablets (60 mg total) by mouth daily. 60 tablet 2   . Prenatal Vit-Fe Fumarate-FA (PRENATAL MULTIVITAMIN) TABS tablet Take 1 tablet by mouth daily at 12 noon.     . promethazine (PHENERGAN) 25 MG tablet Take 1 tablet by mouth 2 (two) times daily.     Marland Kitchen umeclidinium bromide (INCRUSE ELLIPTA) 62.5 MCG/INH AEPB Inhale 1 puff into the lungs daily. 30 each 11   . zolpidem (AMBIEN) 10 MG tablet Take 10 mg by mouth at bedtime as needed for sleep.       Review of Systems  Constitutional: Negative for chills and fever.  Eyes: Negative for visual disturbance.  Respiratory: Negative for shortness of breath.   Gastrointestinal: Positive for nausea. Negative for abdominal pain and vomiting.  Genitourinary: Negative for pelvic pain, vaginal bleeding and vaginal discharge.  Neurological: Positive for headaches.   Physical Exam   Pulse (!) 106, temperature 97.8 F (36.6 C), temperature source Oral, resp. rate 18, weight 128.4 kg, last menstrual period 02/22/2018, SpO2 98 %, unknown if currently breastfeeding.  Physical Exam  Nursing note and vitals reviewed. Constitutional: She is oriented to person, place, and time. She appears well-developed and well-nourished. No distress.  HENT:  Head: Normocephalic.  Cardiovascular: Normal rate.  Respiratory: Effort normal.  GI: Soft. There is no abdominal tenderness. There is no rebound.  Musculoskeletal:        General: Edema (pedal and ankle edema ) present.  Neurological: She is alert and oriented to person, place, and time. She has normal reflexes.  Skin: Skin is warm and dry.  Psychiatric: She has a normal mood and affect.   NST:  Baseline: 150 Variability: moderate Accels: 15x15 Decels: none Toco: none   Lab Results Last 24 Hours       Results for orders placed or performed during the hospital encounter of 10/03/18 (from the past 24 hour(s))  Comprehensive metabolic panel      Status: Abnormal   Collection Time: 10/03/18 10:20 AM  Result Value Ref Range   Sodium 136 135 - 145 mmol/L   Potassium 3.6 3.5 - 5.1 mmol/L   Chloride 106 98 - 111 mmol/L   CO2 20 (L) 22 - 32 mmol/L   Glucose, Bld 99 70 - 99  mg/dL   BUN 5 (L) 6 - 20 mg/dL   Creatinine, Ser 0.56 0.44 - 1.00 mg/dL   Calcium 8.7 (L) 8.9 - 10.3 mg/dL   Total Protein 5.9 (L) 6.5 - 8.1 g/dL   Albumin 2.9 (L) 3.5 - 5.0 g/dL   AST 14 (L) 15 - 41 U/L   ALT 13 0 - 44 U/L   Alkaline Phosphatase 63 38 - 126 U/L   Total Bilirubin 0.4 0.3 - 1.2 mg/dL   GFR calc non Af Amer >60 >60 mL/min   GFR calc Af Amer >60 >60 mL/min   Anion gap 10 5 - 15  CBC     Status: Abnormal   Collection Time: 10/03/18 10:20 AM  Result Value Ref Range   WBC 15.5 (H) 4.0 - 10.5 K/uL   RBC 4.19 3.87 - 5.11 MIL/uL   Hemoglobin 10.3 (L) 12.0 - 15.0 g/dL   HCT 33.5 (L) 36.0 - 46.0 %   MCV 80.0 80.0 - 100.0 fL   MCH 24.6 (L) 26.0 - 34.0 pg   MCHC 30.7 30.0 - 36.0 g/dL   RDW 12.6 11.5 - 15.5 %   Platelets 269 150 - 400 K/uL   nRBC 0.0 0.0 - 0.2 %  Urinalysis, Routine w reflex microscopic     Status: Abnormal   Collection Time: 10/03/18 10:45 AM  Result Value Ref Range   Color, Urine YELLOW YELLOW   APPearance CLOUDY (A) CLEAR   Specific Gravity, Urine 1.023 1.005 - 1.030   pH 5.0 5.0 - 8.0   Glucose, UA NEGATIVE NEGATIVE mg/dL   Hgb urine dipstick NEGATIVE NEGATIVE   Bilirubin Urine NEGATIVE NEGATIVE   Ketones, ur NEGATIVE NEGATIVE mg/dL   Protein, ur NEGATIVE NEGATIVE mg/dL   Nitrite NEGATIVE NEGATIVE   Leukocytes,Ua MODERATE (A) NEGATIVE   RBC / HPF 6-10 0 - 5 RBC/hpf   WBC, UA 11-20 0 - 5 WBC/hpf   Bacteria, UA RARE (A) NONE SEEN   Squamous Epithelial / LPF 11-20 0 - 5   Mucus PRESENT   Protein / creatinine ratio, urine     Status: None   Collection Time: 10/03/18 10:45 AM  Result Value Ref Range   Creatinine, Urine 238.15 mg/dL   Total Protein, Urine 17 mg/dL    Protein Creatinine Ratio 0.07 0.00 - 0.15 mg/mg[Cre]      Patient Vitals for the past 24 hrs:  BP Temp Temp src Pulse Resp SpO2 Weight  10/03/18 1301 (!) 141/85 - - 100 - - -  10/03/18 1245 134/80 - - 97 - - -  10/03/18 1231 131/84 - - 100 - - -  10/03/18 1216 (!) 155/83 - - 94 - - -  10/03/18 1201 (!) 160/82 - - 97 - - -  10/03/18 1145 (!) 149/81 - - 96 - - -  10/03/18 1130 (!) 167/88 - - (!) 109 - - -  10/03/18 1116 (!) 160/85 - - (!) 102 - - -  10/03/18 1101 (!) 142/83 - - (!) 102 - - -  10/03/18 1046 (!) 150/78 - - (!) 110 - - -  10/03/18 1034 (!) 160/84 - - - - - -  10/03/18 1027 (!) 165/94 - - 93 - - -  10/03/18 1011 (!) 171/100 - - - - - -  10/03/18 0953 (!) 167/95 97.8 F (36.6 C) Oral (!) 106 18 - -  10/03/18 0950 - - - - - 98 % -  10/03/18 0941 - - - - - -  128.4 kg    MAU Course  Procedures  MDM Patient initially got 33m procardia PO as she had severe range BP on arrival, and IV had not been placed.  Fioricet for headache  Hydralazine protocol started and patient received the entire protocol. Mid-day dose of labetalol given (6072mPO) Headache has resolved with fioricet  12:55 PM DW Dr. AnHarolyn Rutherfordwill admit for blood pressure management, BMZ and magnesium.   Assessment and Plan   CHTN with superimposed pre-eclampsia, with severe range blood pressure Admit to antenatal BMZ Magnesium Blood pressure management.    HeMarcille BuffyNP, CNM  10/03/18  1:02 PM

## 2018-10-04 DIAGNOSIS — Z3A32 32 weeks gestation of pregnancy: Secondary | ICD-10-CM

## 2018-10-04 DIAGNOSIS — O113 Pre-existing hypertension with pre-eclampsia, third trimester: Secondary | ICD-10-CM

## 2018-10-04 LAB — COMPREHENSIVE METABOLIC PANEL
ALT: 12 U/L (ref 0–44)
AST: 12 U/L — ABNORMAL LOW (ref 15–41)
Albumin: 2.7 g/dL — ABNORMAL LOW (ref 3.5–5.0)
Alkaline Phosphatase: 65 U/L (ref 38–126)
Anion gap: 9 (ref 5–15)
BUN: 7 mg/dL (ref 6–20)
CO2: 20 mmol/L — ABNORMAL LOW (ref 22–32)
Calcium: 8 mg/dL — ABNORMAL LOW (ref 8.9–10.3)
Chloride: 106 mmol/L (ref 98–111)
Creatinine, Ser: 0.67 mg/dL (ref 0.44–1.00)
GFR calc Af Amer: >60 mL/min (ref >60)
GFR calc non Af Amer: >60 mL/min (ref >60)
Glucose, Bld: 115 mg/dL — ABNORMAL HIGH (ref 70–99)
Potassium: 4 mmol/L (ref 3.5–5.1)
Sodium: 135 mmol/L (ref 135–145)
Total Bilirubin: 0.7 mg/dL (ref 0.3–1.2)
Total Protein: 5.7 g/dL — ABNORMAL LOW (ref 6.5–8.1)

## 2018-10-04 LAB — CBC
HCT: 31.6 % — ABNORMAL LOW (ref 36.0–46.0)
Hemoglobin: 9.8 g/dL — ABNORMAL LOW (ref 12.0–15.0)
MCH: 24.6 pg — ABNORMAL LOW (ref 26.0–34.0)
MCHC: 31 g/dL (ref 30.0–36.0)
MCV: 79.2 fL — ABNORMAL LOW (ref 80.0–100.0)
Platelets: 273 10*3/uL (ref 150–400)
RBC: 3.99 MIL/uL (ref 3.87–5.11)
RDW: 12.6 % (ref 11.5–15.5)
WBC: 20.7 10*3/uL — ABNORMAL HIGH (ref 4.0–10.5)
nRBC: 0 % (ref 0.0–0.2)

## 2018-10-04 LAB — CULTURE, BETA STREP (GROUP B ONLY)

## 2018-10-04 LAB — GLUCOSE, CAPILLARY
Glucose-Capillary: 114 mg/dL — ABNORMAL HIGH (ref 70–99)
Glucose-Capillary: 127 mg/dL — ABNORMAL HIGH (ref 70–99)
Glucose-Capillary: 136 mg/dL — ABNORMAL HIGH (ref 70–99)
Glucose-Capillary: 151 mg/dL — ABNORMAL HIGH (ref 70–99)

## 2018-10-04 MED ORDER — INSULIN NPH (HUMAN) (ISOPHANE) 100 UNIT/ML ~~LOC~~ SUSP
12.0000 [IU] | Freq: Two times a day (BID) | SUBCUTANEOUS | Status: DC
  Administered 2018-10-04: 22:00:00 12 [IU] via SUBCUTANEOUS
  Filled 2018-10-04: qty 10

## 2018-10-04 NOTE — Progress Notes (Signed)
Patient ID: Jenna Stephens, female   DOB: 19-Jul-1975, 43 y.o.   MRN: [id number] [place]) NOTE  Jenna Stephens is a 43 y.o. [id number] with Estimated Date of Delivery: 11/29/18   By   [redacted]w[redacted]d  who is admitted for [facility] with SIPE, severe range BP.    Fetal presentation is unsure. Length of Stay:  1  Days  Date of admission:10/03/2018  Subjective: No headache or visual changes Patient reports the fetal movement as decreased .since magnesium Patient reports uterine contraction  activity as none. Patient reports  vaginal bleeding as none. Patient describes fluid per vagina as None.  Vitals:  Blood pressure 124/60, pulse 87, temperature 98.1 F (36.7 C), temperature source Oral, resp. rate 17, height 5\' 7"  (1.702 m), weight 128.4 kg, last menstrual period 02/22/2018, SpO2 98 %, unknown if currently breastfeeding. Vitals:   10/04/18 0738 10/04/18 0740 10/04/18 0745 10/04/18 0750  BP: 124/60     Pulse: 87     Resp: 17     Temp: 98.1 F (36.7 C)     TempSrc: Oral     SpO2: 96% 96% 95% 98%  Weight:      Height:       Physical Examination:  General appearance - alert, well appearing, and in no distress Abdomen - soft, nontender, nondistended, no masses or organomegaly Fundal Height:  size equals dates Pelvic Exam:  examination not indicated Cervical Exam: Not evaluated Extremities: extremities normal, atraumatic, no cyanosis or edema with DTRs 2+ bilaterally Membranes:intact  Fetal Monitoring:  Baseline: 140 bpm, Variability: Good {> 6 bpm), Accelerations: Reactive and Decelerations: Absent   reactive  Labs:  Results for orders placed or performed during the hospital encounter of 10/03/18 (from the past 24 hour(s))  Comprehensive metabolic panel   Collection Time: 10/03/18 10:20 AM  Result Value Ref Range   Sodium 136 135 - 145 mmol/L   Potassium 3.6 3.5 - 5.1 mmol/L   Chloride 106 98 - 111 mmol/L   CO2 20 (L) 22 - 32 mmol/L   Glucose, Bld 99 70 - 99  mg/dL   BUN 5 (L) 6 - 20 mg/dL   Creatinine, Ser 0.56 0.44 - 1.00 mg/dL   Calcium 8.7 (L) 8.9 - 10.3 mg/dL   Total Protein 5.9 (L) 6.5 - 8.1 g/dL   Albumin 2.9 (L) 3.5 - 5.0 g/dL   AST 14 (L) 15 - 41 U/L   ALT 13 0 - 44 U/L   Alkaline Phosphatase 63 38 - 126 U/L   Total Bilirubin 0.4 0.3 - 1.2 mg/dL   GFR calc non Af Amer >60 >60 mL/min   GFR calc Af Amer >60 >60 mL/min   Anion gap 10 5 - 15  CBC   Collection Time: 10/03/18 10:20 AM  Result Value Ref Range   WBC 15.5 (H) 4.0 - 10.5 K/uL   RBC 4.19 3.87 - 5.11 MIL/uL   Hemoglobin 10.3 (L) 12.0 - 15.0 g/dL   HCT 33.5 (L) 36.0 - 46.0 %   MCV 80.0 80.0 - 100.0 fL   MCH 24.6 (L) 26.0 - 34.0 pg   MCHC 30.7 30.0 - 36.0 g/dL   RDW 12.6 11.5 - 15.5 %   Platelets 269 150 - 400 K/uL   nRBC 0.0 0.0 - 0.2 %  Type and screen [place]   Collection Time: 10/03/18 10:20 AM  Result Value Ref Range   ABO/RH(D) A POS    Antibody Screen POS  Sample Expiration 10/06/2018,2359    Antibody Identification ANTI LEA (Lewis a) ANTI LEB (Lewis b)    PT AG Type      NEGATIVE FOR LEWIS A ANTIGEN NEGATIVE FOR LEWIS B ANTIGEN   Unit Number [id number]    Blood Component Type RED CELLS,LR    Unit division 00    Status of Unit ALLOCATED    Transfusion Status OK TO TRANSFUSE    Crossmatch Result COMPATIBLE    Unit Number [id number]    Blood Component Type RED CELLS,LR    Unit division 00    Status of Unit ALLOCATED    Transfusion Status OK TO TRANSFUSE    Crossmatch Result COMPATIBLE   BPAM RBC   Collection Time: 10/03/18 10:20 AM  Result Value Ref Range   Blood Product Unit Number [id number]    PRODUCT CODE G6659D35    Unit Type and Rh 6200    Blood Product Expiration Date 701779390300    Blood Product Unit Number [id number]    PRODUCT CODE H5456Y56    Unit Type and Rh 6200    Blood Product Expiration Date [id number]   Urinalysis, Routine w reflex microscopic   Collection Time: 10/03/18 10:45 AM  Result  Value Ref Range   Color, Urine YELLOW YELLOW   APPearance CLOUDY (A) CLEAR   Specific Gravity, Urine 1.023 1.005 - 1.030   pH 5.0 5.0 - 8.0   Glucose, UA NEGATIVE NEGATIVE mg/dL   Hgb urine dipstick NEGATIVE NEGATIVE   Bilirubin Urine NEGATIVE NEGATIVE   Ketones, ur NEGATIVE NEGATIVE mg/dL   Protein, ur NEGATIVE NEGATIVE mg/dL   Nitrite NEGATIVE NEGATIVE   Leukocytes,Ua MODERATE (A) NEGATIVE   RBC / HPF 6-10 0 - 5 RBC/hpf   WBC, UA 11-20 0 - 5 WBC/hpf   Bacteria, UA RARE (A) NONE SEEN   Squamous Epithelial / LPF 11-20 0 - 5   Mucus PRESENT   Protein / creatinine ratio, urine   Collection Time: 10/03/18 10:45 AM  Result Value Ref Range   Creatinine, Urine 238.15 mg/dL   Total Protein, Urine 17 mg/dL   Protein Creatinine Ratio 0.07 0.00 - 0.15 mg/mg[Cre]  Urine rapid drug screen (hosp performed)   Collection Time: 10/03/18 10:45 AM  Result Value Ref Range   Opiates NONE DETECTED NONE DETECTED   Cocaine NONE DETECTED NONE DETECTED   Benzodiazepines NONE DETECTED NONE DETECTED   Amphetamines NONE DETECTED NONE DETECTED   Tetrahydrocannabinol POSITIVE (A) NONE DETECTED   Barbiturates NONE DETECTED NONE DETECTED  Glucose, capillary   Collection Time: 10/03/18  5:07 PM  Result Value Ref Range   Glucose-Capillary 126 (H) 70 - 99 mg/dL  Glucose, capillary   Collection Time: 10/03/18 10:28 PM  Result Value Ref Range   Glucose-Capillary 144 (H) 70 - 99 mg/dL  Glucose, capillary   Collection Time: 10/04/18  6:54 AM  Result Value Ref Range   Glucose-Capillary 114 (H) 70 - 99 mg/dL  Results for orders placed or performed in visit on 09/17/18 (from the past 24 hour(s))  SARS Coronavirus 2   Collection Time: 10/03/18  1:24 PM  Result Value Ref Range   SARS Coronavirus 2 NOT DETECTED NOT DETECTED    Imaging Studies:     Medications:  Scheduled . aspirin  81 mg Oral Daily  . betamethasone acetate-betamethasone sodium phosphate  12 mg Intramuscular Q24H  . docusate sodium  100 mg  Oral Daily  . enoxaparin (LOVENOX) injection  60 mg Subcutaneous Q24H  . ferrous sulfate  325 mg Oral BID WC  . fluticasone furoate-vilanterol  1 puff Inhalation Daily  . insulin aspart  0-14 Units Subcutaneous QID  . insulin NPH Human  10 Units Subcutaneous BID AC & HS  . labetalol  800 mg Oral TID  . montelukast  10 mg Oral QHS  . NIFEdipine  60 mg Oral BID  . pantoprazole  40 mg Oral Daily  . prenatal multivitamin  1 tablet Oral Q1200  . umeclidinium bromide  1 puff Inhalation Daily   I have reviewed the patient's current medications.  ASSESSMENT: [id number] [redacted]w[redacted]d Estimated Date of Delivery: 11/29/18  CHTN with SIPE, severe range Patient Active Problem List   Diagnosis Date Noted  . Headache in pregnancy, antepartum, third trimester 09/29/2018  . Gestational diabetes mellitus (GDM) affecting pregnancy, antepartum 09/05/2018  . Supervision of high risk pregnancy, antepartum 07/07/2018  . AMA (advanced maternal age) multigravida 59+, unspecified trimester 07/07/2018  . Chronic hypertension with superimposed severe preeclampsia 07/07/2018  . GERD (gastroesophageal reflux disease) 06/09/2018  . Severe persistent asthma without complication 49/70/2637  . HTN (hypertension) 12/29/2017  . Obesity 12/29/2017    PLAN: >Receiving course of BMZ >On magnesium for 24 hours, to end today at 1600 >continue in house observation/management >CBG acceptable s/p steroids, will bump up NPH from 10 to 12 units to get Fasting down [place] 10/04/2018,7:57 AM

## 2018-10-04 NOTE — Progress Notes (Signed)
Pt had two ~30 second episodes of bradycardia this morning 0500. Dr Elonda Husky notified at [phone]. No new orders.

## 2018-10-04 NOTE — Plan of Care (Signed)
  Problem: Education: Goal: Knowledge of disease or condition will improve Outcome: Progressing  Problem: Education: Goal: Knowledge of the prescribed therapeutic regimen will improve Outcome: Progressing    Diabetes management with NPH and addition of SS with 2 hr PP CBGs. HTN meds, and asthma meds. Understanding Mag gtt indications. Management of Mag gtt headache.

## 2018-10-04 NOTE — Progress Notes (Signed)
Inpatient Diabetes Program Recommendations  AACE/ADA: New Consensus Statement on Inpatient Glycemic Control (2015)  Target Ranges:  Prepandial:   less than 140 mg/dL      Peak postprandial:   less than 180 mg/dL (1-2 hours)      Critically ill patients:  140 - 180 mg/dL   Lab Results  Component Value Date   GLUCAP 127 (H) 10/04/2018    Review of Glycemic Control Results for ROISE, EMERT (MRN [id number]) as of 10/04/2018 16:39  Ref. Range 10/04/2018 06:54 10/04/2018 11:04  Glucose-Capillary Latest Ref Range: 70 - 99 mg/dL 114 (H) 127 (H)   Diabetes history: Gestational DM Outpatient Diabetes medications: NPH 10 units q HS (pt. hasn't started yet) Current orders for Inpatient glycemic control:  NPH 12 units bid Novolog 0-14 units 4 times a day (fasting and 2 hour PP)  Inpatient Diabetes Program Recommendations:    Referral received.  Patient has received second dose of Betamethasone this afternoon.  Agree with current orders.  Will follow.   Thanks Adah Perl, RN, BC-ADM Inpatient Diabetes Coordinator Pager [phone]

## 2018-10-05 ENCOUNTER — Other Ambulatory Visit: Payer: Self-pay | Admitting: Obstetrics & Gynecology

## 2018-10-05 ENCOUNTER — Encounter (HOSPITAL_COMMUNITY): Payer: Self-pay | Admitting: Family Medicine

## 2018-10-05 ENCOUNTER — Encounter: Payer: Self-pay | Admitting: Student

## 2018-10-05 DIAGNOSIS — O1410 Severe pre-eclampsia, unspecified trimester: Secondary | ICD-10-CM | POA: Diagnosis present

## 2018-10-05 DIAGNOSIS — Z3A32 32 weeks gestation of pregnancy: Secondary | ICD-10-CM

## 2018-10-05 DIAGNOSIS — O0990 Supervision of high risk pregnancy, unspecified, unspecified trimester: Secondary | ICD-10-CM

## 2018-10-05 DIAGNOSIS — B951 Streptococcus, group B, as the cause of diseases classified elsewhere: Secondary | ICD-10-CM | POA: Insufficient documentation

## 2018-10-05 DIAGNOSIS — O114 Pre-existing hypertension with pre-eclampsia, complicating childbirth: Secondary | ICD-10-CM | POA: Diagnosis not present

## 2018-10-05 LAB — COMPREHENSIVE METABOLIC PANEL
ALT: 12 U/L (ref 0–44)
AST: 11 U/L — ABNORMAL LOW (ref 15–41)
Albumin: 2.8 g/dL — ABNORMAL LOW (ref 3.5–5.0)
Alkaline Phosphatase: 59 U/L (ref 38–126)
Anion gap: 8 (ref 5–15)
BUN: 10 mg/dL (ref 6–20)
CO2: 22 mmol/L (ref 22–32)
Calcium: 8.2 mg/dL — ABNORMAL LOW (ref 8.9–10.3)
Chloride: 106 mmol/L (ref 98–111)
Creatinine, Ser: 0.7 mg/dL (ref 0.44–1.00)
GFR calc Af Amer: >60 mL/min (ref >60)
GFR calc non Af Amer: >60 mL/min (ref >60)
Glucose, Bld: 101 mg/dL — ABNORMAL HIGH (ref 70–99)
Potassium: 3.9 mmol/L (ref 3.5–5.1)
Sodium: 136 mmol/L (ref 135–145)
Total Bilirubin: 0.5 mg/dL (ref 0.3–1.2)
Total Protein: 5.6 g/dL — ABNORMAL LOW (ref 6.5–8.1)

## 2018-10-05 LAB — CBC
HCT: 30.7 % — ABNORMAL LOW (ref 36.0–46.0)
Hemoglobin: 9.5 g/dL — ABNORMAL LOW (ref 12.0–15.0)
MCH: 24.7 pg — ABNORMAL LOW (ref 26.0–34.0)
MCHC: 30.9 g/dL (ref 30.0–36.0)
MCV: 79.9 fL — ABNORMAL LOW (ref 80.0–100.0)
Platelets: 267 10*3/uL (ref 150–400)
RBC: 3.84 MIL/uL — ABNORMAL LOW (ref 3.87–5.11)
RDW: 12.9 % (ref 11.5–15.5)
WBC: 17.9 10*3/uL — ABNORMAL HIGH (ref 4.0–10.5)
nRBC: 0 % (ref 0.0–0.2)

## 2018-10-05 LAB — GLUCOSE, CAPILLARY
Glucose-Capillary: 106 mg/dL — ABNORMAL HIGH (ref 70–99)
Glucose-Capillary: 105 mg/dL — ABNORMAL HIGH (ref 70–99)
Glucose-Capillary: 94 mg/dL (ref 70–99)
Glucose-Capillary: 100 mg/dL — ABNORMAL HIGH (ref 70–99)
Glucose-Capillary: 90 mg/dL (ref 70–99)

## 2018-10-05 MED ORDER — SODIUM CHLORIDE 0.9 % IV SOLN
5.0000 10*6.[IU] | Freq: Once | INTRAVENOUS | Status: AC
  Administered 2018-10-05: 5 10*6.[IU] via INTRAVENOUS
  Filled 2018-10-05: qty 5

## 2018-10-05 MED ORDER — LACTATED RINGERS IV SOLN: INTRAVENOUS | Status: DC

## 2018-10-05 MED ORDER — INSULIN ASPART 100 UNIT/ML ~~LOC~~ SOLN
0.0000 [IU] | SUBCUTANEOUS | Status: DC
  Administered 2018-10-05 – 2018-10-06 (×3): 1 [IU] via SUBCUTANEOUS

## 2018-10-05 MED ORDER — OXYTOCIN 40 UNITS IN NORMAL SALINE INFUSION - SIMPLE MED: 2.5000 [IU]/h | INTRAVENOUS | Status: DC

## 2018-10-05 MED ORDER — MISOPROSTOL 25 MCG QUARTER TABLET
25.0000 ug | ORAL_TABLET | ORAL | Status: DC | PRN
  Administered 2018-10-05: 15:00:00 25 ug via VAGINAL
  Filled 2018-10-05: qty 1

## 2018-10-05 MED ORDER — INSULIN NPH (HUMAN) (ISOPHANE) 100 UNIT/ML ~~LOC~~ SUSP
8.0000 [IU] | Freq: Two times a day (BID) | SUBCUTANEOUS | Status: DC
  Administered 2018-10-05 – 2018-10-06 (×3): 8 [IU] via SUBCUTANEOUS
  Filled 2018-10-05 (×3): qty 10

## 2018-10-05 MED ORDER — LACTATED RINGERS IV SOLN
INTRAVENOUS | Status: DC
  Administered 2018-10-05: 14:00:00 via INTRAVENOUS
  Administered 2018-10-06: 100 mL/h via INTRAVENOUS
  Administered 2018-10-06 – 2018-10-07 (×3): via INTRAVENOUS

## 2018-10-05 MED ORDER — OXYCODONE-ACETAMINOPHEN 5-325 MG PO TABS
1.0000 | ORAL_TABLET | ORAL | Status: DC | PRN
  Administered 2018-10-05: 15:00:00 1 via ORAL
  Filled 2018-10-05: qty 1

## 2018-10-05 MED ORDER — LIDOCAINE HCL (PF) 1 % IJ SOLN: 30.0000 mL | INTRAMUSCULAR | Status: DC | PRN

## 2018-10-05 MED ORDER — BUTALBITAL-APAP-CAFFEINE 50-325-40 MG PO TABS
1.0000 | ORAL_TABLET | Freq: Four times a day (QID) | ORAL | Status: DC | PRN
  Administered 2018-10-05: 13:00:00 2 via ORAL
  Filled 2018-10-05: qty 2

## 2018-10-05 MED ORDER — INSULIN NPH (HUMAN) (ISOPHANE) 100 UNIT/ML ~~LOC~~ SUSP
14.0000 [IU] | Freq: Two times a day (BID) | SUBCUTANEOUS | Status: DC
  Administered 2018-10-05: 10:00:00 14 [IU] via SUBCUTANEOUS
  Filled 2018-10-05: qty 10

## 2018-10-05 MED ORDER — MAGNESIUM SULFATE 40 G IN LACTATED RINGERS - SIMPLE
INTRAVENOUS | Status: AC
  Filled 2018-10-05: qty 500

## 2018-10-05 MED ORDER — PENICILLIN G 3 MILLION UNITS IVPB - SIMPLE MED
3.0000 10*6.[IU] | INTRAVENOUS | Status: DC
  Administered 2018-10-05 – 2018-10-06 (×7): 3 10*6.[IU] via INTRAVENOUS
  Filled 2018-10-05 (×9): qty 100

## 2018-10-05 MED ORDER — SOD CITRATE-CITRIC ACID 500-334 MG/5ML PO SOLN: 30.0000 mL | ORAL | Status: DC | PRN

## 2018-10-05 MED ORDER — MISOPROSTOL 50MCG HALF TABLET
50.0000 ug | ORAL_TABLET | ORAL | Status: DC | PRN
  Administered 2018-10-05 – 2018-10-06 (×3): 50 ug via BUCCAL
  Filled 2018-10-05 (×3): qty 1

## 2018-10-05 MED ORDER — FENTANYL CITRATE (PF) 100 MCG/2ML IJ SOLN
100.0000 ug | INTRAMUSCULAR | Status: DC | PRN
  Administered 2018-10-06 (×2): 100 ug via INTRAVENOUS
  Filled 2018-10-05 (×2): qty 2

## 2018-10-05 MED ORDER — OXYTOCIN BOLUS FROM INFUSION
500.0000 mL | Freq: Once | INTRAVENOUS | Status: AC
  Administered 2018-10-07: 06:00:00 500 mL via INTRAVENOUS

## 2018-10-05 MED ORDER — ACETAMINOPHEN 325 MG PO TABS: 650.0000 mg | ORAL_TABLET | ORAL | Status: DC | PRN

## 2018-10-05 MED ORDER — TERBUTALINE SULFATE 1 MG/ML IJ SOLN: 0.2500 mg | Freq: Once | INTRAMUSCULAR | Status: DC | PRN

## 2018-10-05 MED ORDER — OXYCODONE-ACETAMINOPHEN 5-325 MG PO TABS: 2.0000 | ORAL_TABLET | ORAL | Status: DC | PRN

## 2018-10-05 MED ORDER — LACTATED RINGERS IV SOLN: 500.0000 mL | INTRAVENOUS | Status: DC | PRN

## 2018-10-05 MED ORDER — MAGNESIUM SULFATE 40 G IN LACTATED RINGERS - SIMPLE
2.0000 g/h | INTRAVENOUS | Status: DC
  Administered 2018-10-06: 10:00:00 2 g/h via INTRAVENOUS
  Filled 2018-10-05: qty 500

## 2018-10-05 MED ORDER — ONDANSETRON HCL 4 MG/2ML IJ SOLN: 4.0000 mg | Freq: Four times a day (QID) | INTRAMUSCULAR | Status: DC | PRN

## 2018-10-05 MED ORDER — MAGNESIUM SULFATE BOLUS VIA INFUSION
5.0000 g | Freq: Once | INTRAVENOUS | Status: AC
  Administered 2018-10-05: 14:00:00 5 g via INTRAVENOUS

## 2018-10-05 MED ORDER — OXYTOCIN 40 UNITS IN NORMAL SALINE INFUSION - SIMPLE MED: 1.0000 m[IU]/min | INTRAVENOUS | Status: DC

## 2018-10-05 NOTE — Progress Notes (Signed)
Labor Progress Note  Subjective: Pt is doing well. Reports some contractions and cramping, but not requiring pain medication at this time. Pt denies HA, vision changes or any other complaints at this time.   Objective: BP 137/83   Pulse 86   Temp 98.3 F (36.8 C) (Oral)   Resp 18   Ht 5\' 7"  (1.702 m)   Wt 128.4 kg   LMP 02/22/2018   SpO2 96%   BMI 44.32 kg/m  Gen: alert, cooperative  Dilation: 1 Effacement (%): Thick Station: Ballotable Presentation: Vertex Exam by:: L Wallace  Assessment and Plan: 43 y.o. [id number] [redacted]w[redacted]d admitted to L&D for IOL for preeclampsia with severe features.   Labor:  -- Foley bulb placed -- Pt to receive 3rd dose of buccal cytotec -- 2g/hr Mag sulfate -- Continue Procardia 60mg  BID and Labetalol 800mg  TID -- GBS positive; PCN x2 doses received -- CBG q4 hours -- Continue NPH 8U BID plus SSI -- Pain control: plans IV pain medication or possible epidural -- PPH Risk: high  Fetal Well-Being:  -- EFW 1135g on 09/17/2018 -- Category 1; FHR 130, moderate variability, +accels, no decels -- Continuous fetal monitoring  -- S/p BMX x2 doses on 6/12 and 6/13  Maryagnes Amos, SNM 11:22 PM

## 2018-10-05 NOTE — Progress Notes (Signed)
[facility]: Labor Progress Note  Subjective: Into room to introduce self to patient. Doing well but still has a headache above and behind her eyes.   Objective: BP (!) 149/101   Pulse (!) 115   Temp 97.8 F (36.6 C) (Oral)   Resp 16   Ht 5\' 7"  (1.702 m)   Wt 128.4 kg   LMP 02/22/2018   SpO2 96%   BMI 44.32 kg/m  Gen: tired-appearing, NAD Dilation: Closed Effacement (%): Thick Station: Ballotable Presentation: Vertex(via [place]) Exam by:: L Wallace  Assessment and Plan: 43 y.o. B7S2831 101w1d transferred from antepartum service for induction of labor for superimposed severe preeclampsia.   Labor: Induction to start with cytotec and then hopefully FB when able.  -- pain control: open to options -- PPH Risk: medium   Superimposed Severe Preeclampsia: Moderate to severe range pressures. Has not needed IV coverage in about 2 days. Currently with headache that did not improve with Fioricet. Will try treating HA with percocet and then reevaluate. UPC 0.07 on admission with other labs wnl.  -- Mg++ for seizure prophylaxis  -- repeat PIH labs  -- continue labetalol 800mg  TID, nifedipine 60mg  BID  A2GDM: Was on NPH and SSI while admitted to antepartum service. -- continue NPH BID while in early labor with SSI -- CBG every 4 hours   Fetal Well-Being: EFW 21% (1135g) at 29w4. Cephalic by BSUS.  -- Category I - continuous fetal monitoring  -- GBS positive - PCN -- received BMZ 6/12-6/13  Laurel S. Juleen China, DO OB/GYN Fellow, Faculty Practice  2:43 PM

## 2018-10-05 NOTE — Progress Notes (Signed)
Spoke with OR nurse who relayed to Dr Elonda Husky about Patient not feeling well, He said he would be down in 20 minutes.

## 2018-10-05 NOTE — Progress Notes (Signed)
Patient ID: Jenna Stephens, female   DOB: 1975-08-24, 43 y.o.   MRN: [id number] Subjective: Interval History: pt has developed bifrontal severe headache which did not resolve with tyleno or fioricet she also has spots and lightning streaks in her vision and she feels dizzy when she closes her eyes Her magnesium has been off for 20 hours and this is a new symtpom She is s/p BMZ x 2  Objective: Vital signs in last 24 hours: Temp:  [97.7 F (36.5 C)-98.7 F (37.1 C)] 98.2 F (36.8 C) (06/14 1136) Pulse Rate:  [89-101] 99 (06/14 1213) Resp:  [18-21] 20 (06/14 1136) BP: (119-136)/(65-80) 128/76 (06/14 1213) SpO2:  [94 %-100 %] 98 % (06/14 1136)  Intake/Output from previous day: 06/13 0701 - 06/14 0700 In: 2421.6 [P.O.:1472; I.V.:949.6] Out: 1100 [Urine:1100] Intake/Output this shift: Total I/O In: -  Out: 400 [Urine:400]    Results for orders placed or performed during the hospital encounter of 10/03/18 (from the past 24 hour(s))  Glucose, capillary     Status: Abnormal   Collection Time: 10/04/18  5:07 PM  Result Value Ref Range   Glucose-Capillary 136 (H) 70 - 99 mg/dL  Glucose, capillary     Status: Abnormal   Collection Time: 10/04/18  8:21 PM  Result Value Ref Range   Glucose-Capillary 151 (H) 70 - 99 mg/dL  Glucose, capillary     Status: Abnormal   Collection Time: 10/05/18  8:24 AM  Result Value Ref Range   Glucose-Capillary 106 (H) 70 - 99 mg/dL  Glucose, capillary     Status: Abnormal   Collection Time: 10/05/18 11:34 AM  Result Value Ref Range   Glucose-Capillary 105 (H) 70 - 99 mg/dL    Studies/Results: [place] Mfm Fetal Bpp Wo Non Stress  Result Date: 10/04/2018 ----------------------------------------------------------------------  OBSTETRICS REPORT                       (Signed Final 10/04/2018 12:41 pm) ---------------------------------------------------------------------- Patient Info  ID #:       [id number]                          D.O.B.:  [birth year] (42 yrs)   Name:       Jenna Stephens                Visit Date: 10/03/2018 05:12 pm ---------------------------------------------------------------------- Performed By  Performed By:     Berlinda Last          Ref. Address:      [place]  [place]  Attending:        Tama High MD        Location[facility]  Referred By:      Truett Mainland                    MD ---------------------------------------------------------------------- Orders   #  Description                          Code         Ordered By   1  [place] MFM FETAL BPP WO NON              02542.70     UGONNA ANYANWU      STRESS  ----------------------------------------------------------------------   #  Order #                    Accession #                 Episode #   1  [id number]                  [id number]                  [id number]  ---------------------------------------------------------------------- Indications   Hypertension - Chronic with superimposed       O11.9 O10.919   preeclampsia   [redacted] weeks gestation of pregnancy                Z3A.31   Uterine fibroids                               O34.10   Asthma (Albuterol)                             O99.89 I62.703   Obesity complicating pregnancy, third          O99.213   trimester (BMI 52)   Advanced maternal age multigravida 73+,        O68.523   third trimester (67yr) (Low risk NIPS) (WNL   Echo)  ---------------------------------------------------------------------- Vital Signs                                                 Height:        5'8" ---------------------------------------------------------------------- Fetal Evaluation  Num Of Fetuses:          1  Fetal Heart Rate(bpm):   135  Cardiac  Activity:        Observed  Presentation:            Cephalic  Placenta:                Anterior  Amniotic Fluid  AFI FV:      Within normal limits  AFI  Sum(cm)     %Tile       Largest Pocket(cm)  13.35           42          3.85  RUQ(cm)       RLQ(cm)       LUQ(cm)        LLQ(cm)  3.85          3.75          1.93           3.82 ---------------------------------------------------------------------- Biophysical Evaluation  Amniotic F.V:   Within normal limits       F. Tone:         Observed  F. Movement:    Observed                   Score:           8/8  F. Breathing:   Observed ---------------------------------------------------------------------- OB History  Gravidity:    6         Term:   2        Prem:   0        SAB:   2  TOP:          1       Ectopic:  0        Living: 2 ---------------------------------------------------------------------- Gestational Age  LMP:           31w 6d        Date:  02/22/18                 EDD:   11/29/18  Best:          31w 6d     Det. By:  LMP  (02/22/18)          EDD:   11/29/18 ---------------------------------------------------------------------- Anatomy  Stomach:               Appears normal, left   Bladder:                Appears normal                         sided ---------------------------------------------------------------------- Impression  Amniotic fluid is normal and good fetal activity is seen.  Antenatal testing is reassuring. BPP 8/8. ----------------------------------------------------------------------                  Tama High, MD Electronically Signed Final Report   10/04/2018 12:41 pm ----------------------------------------------------------------------  [place] Mfm Fetal Bpp Wo Non Stress  Result Date: 09/17/2018 ----------------------------------------------------------------------  OBSTETRICS REPORT                       (Signed Final 09/17/2018 10:55 am) ---------------------------------------------------------------------- Patient Info  ID #:        [id number]                          D.O.B.:  04-30-1975 (42 yrs)  Name:       Jenna Stephens                Visit Date: 09/17/2018 09:43 am ---------------------------------------------------------------------- Performed By  Performed By:     Corky Crafts             Ref. Address:     [address]  [place]                                                             [address]                                                             [place]                                                             [id number]  Attending:        Tama High MD        Location[facility]  Referred By:      Truett Mainland                    MD ---------------------------------------------------------------------- Orders   #  Description                          Code         Ordered By   1  [place] MFM OB FOLLOW UP                  30092.33     Sander Nephew   2  [place] MFM FETAL BPP WO NON              76819.01     [place]   3  [place] MFM UA CORD DOPPLER               76820.02     CORENTHIAN  BOOKER  ----------------------------------------------------------------------   #  Order #                    Accession #                 Episode #   1  [id number]                  [id number]                  [id number]   2  [id number]                  [id number]                  [id number]   3  [id number]                  [id number]                  [id number]  ---------------------------------------------------------------------- Indications   Maternal care for known or suspected poor      O36.5930   fetal growth, third trimester, not applicable or   unspecified   Hypertension - Chronic/Pre-                    O10.019   existing(Labetalol,ASA,Procardia)   [redacted] weeks gestation of  pregnancy                Z3A.29   Encounter for other antenatal screening        Z36.2   follow-up   Uterine fibroids                               O34.10   Asthma (Albuterol)                             O99.89 K53.976   Obesity complicating pregnancy, third          O99.213   trimester (BMI 74)   Advanced maternal age multigravida 54+,        O42.523   third trimester (58yr) (Low risk NIPS) (WNL   Echo)  ---------------------------------------------------------------------- Vital Signs  Weight (lb): 275                               Height:        5'8"  BMI:         41.81 ---------------------------------------------------------------------- Fetal Evaluation  Num Of Fetuses:         1  Fetal Heart Rate(bpm):  140  Cardiac Activity:       Observed  Presentation:           Breech  Placenta:               Anterior  P. Cord Insertion:      Previously Visualized  Amniotic Fluid  AFI FV:      Within normal limits  AFI Sum(cm)     %Tile       Largest Pocket(cm)  12.7            35          5.26  RUQ(cm)       RLQ(cm)       LUQ(cm)        LLQ(cm)  3.45  1.97          2.02           5.26 ---------------------------------------------------------------------- Biophysical Evaluation  Amniotic F.V:   Pocket => 2 cm two         F. Tone:        Observed                  planes  F. Movement:    Observed                   Score:          8/8  F. Breathing:   Observed ---------------------------------------------------------------------- Biometry  BPD:      70.1  mm     G. Age:  28w 1d          6  %    CI:        73.55   %    70 - 86                                                          FL/HC:      20.3   %    19.2 - 21.4  HC:      259.7  mm     G. Age:  28w 2d        < 3  %    HC/AC:      1.12        0.99 - 1.21  AC:      232.1  mm     G. Age:  27w 4d          4  %    FL/BPD:     75.3   %    71 - 87  FL:       52.8  mm     G. Age:  28w 1d          7  %    FL/AC:      22.7   %    20 - 24  HUM:      48.6  mm     G. Age:   28w 4d         28  %  LV:        5.3  mm  Est. FW:    1135  gm      2 lb 8 oz     21  % ---------------------------------------------------------------------- OB History  Gravidity:    6         Term:   2        Prem:   0        SAB:   2  TOP:          1       Ectopic:  0        Living: 2 ---------------------------------------------------------------------- Gestational Age  LMP:           29w 4d        Date:  02/22/18                 EDD:   11/29/18  U/S Today:     28w 0d  EDD:   12/10/18  Best:          29w 4d     Det. By:  LMP  (02/22/18)          EDD:   11/29/18 ---------------------------------------------------------------------- Anatomy  Cranium:               Appears normal         Aortic Arch:            Not well visualized  Cavum:                 Previously seen        Ductal Arch:            Previously seen  Ventricles:            Appears normal         Diaphragm:              Previously seen  Choroid Plexus:        Previously seen        Stomach:                Appears normal, left                                                                        sided  Cerebellum:            Previously seen        Abdomen:                Previously seen  Posterior Fossa:       Previously seen        Abdominal Wall:         Previously seen  Nuchal Fold:           Previously seen        Cord Vessels:           Previously seen  Face:                  Orbits and profile     Kidneys:                Appear normal                         previously seen  Lips:                  Previously seen        Bladder:                Appears normal  Thoracic:              Appears normal         Spine:                  Previously seen  Heart:                 Not well visualized    Upper Extremities:      Previously seen  RVOT:                  Previously seen  Lower Extremities:      Previously seen  LVOT:                  Previously seen  Other:  Female gender prev seen.  Right Heel and  5th digit prev visualized.          Technically difficult due to maternal habitus and fetal position. ---------------------------------------------------------------------- Doppler - Fetal Vessels  Umbilical Artery   S/D     %tile                                            ADFV    RDFV  3.31       68                                                No      No ---------------------------------------------------------------------- Cervix Uterus Adnexa  Cervix  Not visualized (advanced GA >24wks) ---------------------------------------------------------------------- Impression  Chronic hypertension: Patient takes labetalol for control. BP  at our office today is 141/93 mm Hg.  Gestational diabetes: Patient reports she is checking her  blood glucose and they are reportedly within normal range.  On ultrasound, fetal growth is appropriate for gestational age.  Abdominal circumference measurement is at less than the  10th percentile. Amniotic fluid is normal and good fetal  activity is seen. Antenatal testing is reassuring. Umbilical  artery Doppler study showed normal forward diastolic flow.  I reassured the patient of normal findings and explained that  AC measurement is more-likely to be constitutional. ---------------------------------------------------------------------- Recommendations  -Follow-up growth assessment in 3 weeks.  -Umbilical artery Doppler if AC measurement is at less than  the 10th percentile.  -Weekly BPP from 32 weeks (chronic hypertension on  antihypertensives). ----------------------------------------------------------------------                  Tama High, MD Electronically Signed Final Report   09/17/2018 10:55 am ----------------------------------------------------------------------  [place] Mfm Ob Follow Up  Result Date: 09/17/2018 ----------------------------------------------------------------------  OBSTETRICS REPORT                       (Signed Final 09/17/2018 10:55 am)  ---------------------------------------------------------------------- Patient Info  ID #:       [id number]                          D.O.B.:  [birth year] (42 yrs)  Name:       Jenna Stephens                Visit Date: 09/17/2018 09:43 am ---------------------------------------------------------------------- Performed By  Performed By:     Corky Crafts             Ref. Address:     [address]                    [place]  [place]                                                             [id number]  Attending:        Tama High MD        Location[facility]  Referred By:      Truett Mainland                    MD ---------------------------------------------------------------------- Orders   #  Description                          Code         Ordered By   1  [place] MFM OB FOLLOW UP                  67341.93     Sander Nephew   2  [place] MFM FETAL BPP WO NON              76819.01     [place]   3  [place] MFM UA CORD DOPPLER               79024.09     Sander Nephew  ----------------------------------------------------------------------   #  Order #                    Accession #                 Episode #   1  [id number]  [id number]                  [id number]   2  [id number]                  [id number]                  [id number]   3  [id number]                  [id number]                  [id number]  ---------------------------------------------------------------------- Indications   Maternal care for known or suspected poor      O36.5930   fetal growth, third trimester, not applicable or   unspecified   Hypertension -  Chronic/Pre-                    O10.019   existing(Labetalol,ASA,Procardia)   [redacted] weeks gestation of pregnancy                Z3A.29   Encounter for other antenatal screening        Z36.2   follow-up   Uterine fibroids                               O34.10   Asthma (Albuterol)                             O99.89 S96.283   Obesity complicating pregnancy, third          O99.213   trimester (BMI 74)   Advanced maternal age multigravida 35+,        O100.523   third trimester (65yr) (Low risk NIPS) (WNL   Echo)  ---------------------------------------------------------------------- Vital Signs  Weight (lb): 275                               Height:        5'8"  BMI:         41.81 ---------------------------------------------------------------------- Fetal Evaluation  Num Of Fetuses:         1  Fetal Heart Rate(bpm):  140  Cardiac Activity:       Observed  Presentation:           Breech  Placenta:               Anterior  P. Cord Insertion:      Previously Visualized  Amniotic Fluid  AFI FV:      Within normal limits  AFI Sum(cm)     %Tile       Largest Pocket(cm)  12.7            35          5.26  RUQ(cm)       RLQ(cm)       LUQ(cm)        LLQ(cm)  3.45          1.97          2.02           5.26 ---------------------------------------------------------------------- Biophysical Evaluation  Amniotic F.V:   Pocket => 2 cm two         F. Tone:        Observed  planes  F. Movement:    Observed                   Score:          8/8  F. Breathing:   Observed ---------------------------------------------------------------------- Biometry  BPD:      70.1  mm     G. Age:  28w 1d          6  %    CI:        73.55   %    70 - 86                                                          FL/HC:      20.3   %    19.2 - 21.4  HC:      259.7  mm     G. Age:  28w 2d        < 3  %    HC/AC:      1.12        0.99 - 1.21  AC:      232.1  mm     G. Age:  27w 4d          4  %    FL/BPD:     75.3   %    71 - 87  FL:       52.8  mm      G. Age:  28w 1d          7  %    FL/AC:      22.7   %    20 - 24  HUM:      48.6  mm     G. Age:  28w 4d         28  %  LV:        5.3  mm  Est. FW:    1135  gm      2 lb 8 oz     21  % ---------------------------------------------------------------------- OB History  Gravidity:    6         Term:   2        Prem:   0        SAB:   2  TOP:          1       Ectopic:  0        Living: 2 ---------------------------------------------------------------------- Gestational Age  LMP:           29w 4d        Date:  02/22/18                 EDD:   11/29/18  U/S Today:     28w 0d                                        EDD:   12/10/18  Best:          29w 4d     Det. By:  LMP  (02/22/18)          EDD:   11/29/18 ---------------------------------------------------------------------- Anatomy  Cranium:  Appears normal         Aortic Arch:            Not well visualized  Cavum:                 Previously seen        Ductal Arch:            Previously seen  Ventricles:            Appears normal         Diaphragm:              Previously seen  Choroid Plexus:        Previously seen        Stomach:                Appears normal, left                                                                        sided  Cerebellum:            Previously seen        Abdomen:                Previously seen  Posterior Fossa:       Previously seen        Abdominal Wall:         Previously seen  Nuchal Fold:           Previously seen        Cord Vessels:           Previously seen  Face:                  Orbits and profile     Kidneys:                Appear normal                         previously seen  Lips:                  Previously seen        Bladder:                Appears normal  Thoracic:              Appears normal         Spine:                  Previously seen  Heart:                 Not well visualized    Upper Extremities:      Previously seen  RVOT:                  Previously seen        Lower Extremities:      Previously  seen  LVOT:                  Previously seen  Other:  Female gender prev seen.  Right Heel and 5th digit prev visualized.  Technically difficult due to maternal habitus and fetal position. ---------------------------------------------------------------------- Doppler - Fetal Vessels  Umbilical Artery   S/D     %tile                                            ADFV    RDFV  3.31       68                                                No      No ---------------------------------------------------------------------- Cervix Uterus Adnexa  Cervix  Not visualized (advanced GA >24wks) ---------------------------------------------------------------------- Impression  Chronic hypertension: Patient takes labetalol for control. BP  at our office today is 141/93 mm Hg.  Gestational diabetes: Patient reports she is checking her  blood glucose and they are reportedly within normal range.  On ultrasound, fetal growth is appropriate for gestational age.  Abdominal circumference measurement is at less than the  10th percentile. Amniotic fluid is normal and good fetal  activity is seen. Antenatal testing is reassuring. Umbilical  artery Doppler study showed normal forward diastolic flow.  I reassured the patient of normal findings and explained that  AC measurement is more-likely to be constitutional. ---------------------------------------------------------------------- Recommendations  -Follow-up growth assessment in 3 weeks.  -Umbilical artery Doppler if AC measurement is at less than  the 10th percentile.  -Weekly BPP from 32 weeks (chronic hypertension on  antihypertensives). ----------------------------------------------------------------------                  Tama High, MD Electronically Signed Final Report   09/17/2018 10:55 am ----------------------------------------------------------------------  [place] Mfm Ua Cord Doppler  Result Date:  09/17/2018 ----------------------------------------------------------------------  OBSTETRICS REPORT                       (Signed Final 09/17/2018 10:55 am) ---------------------------------------------------------------------- Patient Info  ID #:       [id number]                          D.O.B.:  1975/06/07 (42 yrs)  Name:       Jenna Stephens                Visit Date: 09/17/2018 09:43 am ---------------------------------------------------------------------- Performed By  Performed By:     Corky Crafts             Ref. Address:     [employer]  [place]                                                             [phone]  Attending:        Tama High MD        Location[facility]  Referred By:      Truett Mainland                    MD ---------------------------------------------------------------------- Orders   #  Description                          Code         Ordered By   1  [place] MFM OB FOLLOW UP                  59163.84     Sander Nephew   2  [place] MFM FETAL BPP WO NON              76819.01     [place]   3  [place] MFM UA CORD DOPPLER               66599.35     Sander Nephew  ----------------------------------------------------------------------   #  Order #                    Accession #                 Episode #   1  [id number]                  [id number]                  [id number]   2  [id number]                  [id number]   3  [id number]                  [id number]                  [id number]  ----------------------------------------------------------------------  Indications   Maternal care for known or suspected poor      O36.5930   fetal growth, third trimester, not applicable or   unspecified   Hypertension - Chronic/Pre-                    O10.019   existing(Labetalol,ASA,Procardia)   [redacted] weeks gestation of pregnancy                Z3A.29   Encounter for other antenatal screening        Z36.2   follow-up   Uterine fibroids                               O34.10   Asthma (Albuterol)                             O99.89 U13.244   Obesity complicating pregnancy, third          O99.213   trimester (BMI 61)   Advanced maternal age multigravida 35+,        O48.523   third trimester (21yr) (Low risk NIPS) (WNL   Echo)  ---------------------------------------------------------------------- Vital Signs  Weight (lb): 275                               Height:        5'8"  BMI:         41.81 ---------------------------------------------------------------------- Fetal Evaluation  Num Of Fetuses:         1  Fetal Heart Rate(bpm):  140  Cardiac Activity:       Observed  Presentation:           Breech  Placenta:               Anterior  P. Cord Insertion:      Previously Visualized  Amniotic Fluid  AFI FV:      Within normal limits  AFI Sum(cm)     %Tile       Largest Pocket(cm)  12.7            35          5.26  RUQ(cm)       RLQ(cm)       LUQ(cm)        LLQ(cm)  3.45          1.97          2.02           5.26 ---------------------------------------------------------------------- Biophysical Evaluation  Amniotic F.V:   Pocket => 2 cm two         F. Tone:        Observed                  planes  F. Movement:    Observed                   Score:          8/8  F. Breathing:   Observed ---------------------------------------------------------------------- Biometry  BPD:  70.1  mm     G. Age:  28w 1d          6  %    CI:        73.55   %    70 - 86                                                          FL/HC:      20.3   %    19.2 - 21.4  HC:      259.7  mm     G. Age:  28w 2d        < 3  %     HC/AC:      1.12        0.99 - 1.21  AC:      232.1  mm     G. Age:  27w 4d          4  %    FL/BPD:     75.3   %    71 - 87  FL:       52.8  mm     G. Age:  28w 1d          7  %    FL/AC:      22.7   %    20 - 24  HUM:      48.6  mm     G. Age:  28w 4d         28  %  LV:        5.3  mm  Est. FW:    1135  gm      2 lb 8 oz     21  % ---------------------------------------------------------------------- OB History  Gravidity:    6         Term:   2        Prem:   0        SAB:   2  TOP:          1       Ectopic:  0        Living: 2 ---------------------------------------------------------------------- Gestational Age  LMP:           29w 4d        Date:  02/22/18                 EDD:   11/29/18  U/S Today:     28w 0d                                        EDD:   12/10/18  Best:          29w 4d     Det. By:  LMP  (02/22/18)          EDD:   11/29/18 ---------------------------------------------------------------------- Anatomy  Cranium:               Appears normal         Aortic Arch:            Not well visualized  Cavum:  Previously seen        Ductal Arch:            Previously seen  Ventricles:            Appears normal         Diaphragm:              Previously seen  Choroid Plexus:        Previously seen        Stomach:                Appears normal, left                                                                        sided  Cerebellum:            Previously seen        Abdomen:                Previously seen  Posterior Fossa:       Previously seen        Abdominal Wall:         Previously seen  Nuchal Fold:           Previously seen        Cord Vessels:           Previously seen  Face:                  Orbits and profile     Kidneys:                Appear normal                         previously seen  Lips:                  Previously seen        Bladder:                Appears normal  Thoracic:              Appears normal         Spine:                  Previously seen  Heart:                  Not well visualized    Upper Extremities:      Previously seen  RVOT:                  Previously seen        Lower Extremities:      Previously seen  LVOT:                  Previously seen  Other:  Female gender prev seen.  Right Heel and 5th digit prev visualized.          Technically difficult due to maternal habitus and fetal position. ---------------------------------------------------------------------- Doppler - Fetal Vessels  Umbilical Artery   S/D     %tile  ADFV    RDFV  3.31       68                                                No      No ---------------------------------------------------------------------- Cervix Uterus Adnexa  Cervix  Not visualized (advanced GA >24wks) ---------------------------------------------------------------------- Impression  Chronic hypertension: Patient takes labetalol for control. BP  at our office today is 141/93 mm Hg.  Gestational diabetes: Patient reports she is checking her  blood glucose and they are reportedly within normal range.  On ultrasound, fetal growth is appropriate for gestational age.  Abdominal circumference measurement is at less than the  10th percentile. Amniotic fluid is normal and good fetal  activity is seen. Antenatal testing is reassuring. Umbilical  artery Doppler study showed normal forward diastolic flow.  I reassured the patient of normal findings and explained that  AC measurement is more-likely to be constitutional. ---------------------------------------------------------------------- Recommendations  -Follow-up growth assessment in 3 weeks.  -Umbilical artery Doppler if AC measurement is at less than  the 10th percentile.  -Weekly BPP from 32 weeks (chronic hypertension on  antihypertensives). ----------------------------------------------------------------------                  Tama High, MD Electronically Signed Final Report   09/17/2018 10:55 am  ----------------------------------------------------------------------   Scheduled Meds: . aspirin  81 mg Oral Daily  . docusate sodium  100 mg Oral Daily  . enoxaparin (LOVENOX) injection  60 mg Subcutaneous Q24H  . ferrous sulfate  325 mg Oral BID WC  . fluticasone furoate-vilanterol  1 puff Inhalation Daily  . insulin aspart  0-14 Units Subcutaneous QID  . insulin NPH Human  14 Units Subcutaneous BID AC & HS  . labetalol  800 mg Oral TID  . magnesium  5 g Intravenous Once  . montelukast  10 mg Oral QHS  . NIFEdipine  60 mg Oral BID  . pantoprazole  40 mg Oral Daily  . prenatal multivitamin  1 tablet Oral Q1200  . umeclidinium bromide  1 puff Inhalation Daily   Continuous Infusions: . magnesium sulfate 40 grams in LR 500 mL    . magnesium sulfate     PRN Meds:acetaminophen, albuterol, butalbital-acetaminophen-caffeine, calcium carbonate, hydrALAZINE **AND** hydrALAZINE **AND** labetalol **AND** labetalol **AND** [COMPLETED] Measure blood pressure, promethazine, promethazine, zolpidem  Assessment/Plan: [redacted]w[redacted]d Estimated Date of Delivery: 11/29/18  CHTN with superimposed severe pre eclampsia, initially BP now with CNS symptoms s/p magnesium x 24 hours previously and s/p betmethasone x 2 doses  Labs to be rechecked and magnesium restarted  Pt understands that with this development we will need to proceed with cervical ripening and induction of labor with delivery She understands 32 weeks is early and baby will be admitted to the NICU post delivery for care All questions were answered    LOS: 2 days   Florian Buff  10/05/2018 1:50 PM

## 2018-10-05 NOTE — Progress Notes (Signed)
OB/GYN Faculty Practice: Labor Progress Note  Subjective: Doing well, headache now resolved. Tired.   Objective: BP 124/69   Pulse 88   Temp 97.8 F (36.6 C) (Oral)   Resp 18   Ht 5\' 7"  (1.702 m)   Wt 128.4 kg   LMP 02/22/2018   SpO2 96%   BMI 44.32 kg/m  Gen: tired-appearing, NAD Dilation: Fingertip Effacement (%): Thick Station: Ballotable Presentation: Vertex Exam by:: L Wallace  Assessment and Plan: 43 y.o. [id number] [redacted]w[redacted]d transferred from antepartum service for induction of labor for superimposed severe preeclampsia.   Labor:Cervix essentially unchanged, maybe slightly more anterior. Will try alternate route of cytotec. Hope for FB with next check.  -- pain control: open to options -- PPH Risk: medium   Superimposed Severe Preeclampsia: Now asymptomatic. Moderate to severe range pressures. Repeat PIH labs stable.  -- Mg++ for seizure prophylaxis  -- continue labetalol 800mg  TID, nifedipine 60mg  BID  A2GDM: Was on NPH and SSI while admitted to antepartum service. -- continue NPH BID while in early labor with SSI -- CBG every 4 hours   Fetal Well-Being: EFW 21% (1135g) at 29w4. Cephalic by BSUS.  -- Category I - continuous fetal monitoring  -- GBS positive - PCN -- received BMZ 6/12-6/13  Laurel S. Juleen China, DO OB/GYN Fellow, Faculty Practice  7:15 PM

## 2018-10-05 NOTE — Progress Notes (Signed)
Patient ID: Jenna Stephens, female   DOB: 07/27/1975, 43 y.o.   MRN: [id number] [place]) NOTE  [name] is a 43 y.o. [id number] at [redacted]w[redacted]d who is admitted for [facility] with SIPE, severe range BP.    Fetal presentation is unsure. Length of Stay:  2  Days  Date of admission:10/03/2018  Subjective: Patient reports feeling well with a mild headache s/p magnesium sulfate and denies visual changes, RUQ/epigastric pain Patient reports the fetal movement as active Patient reports uterine contraction  activity as none. Patient reports  vaginal bleeding as none. Patient describes fluid per vagina as None.  Vitals:  Blood pressure 121/74, pulse 94, temperature 97.7 F (36.5 C), temperature source Oral, resp. rate 20, height 5\' 7"  (1.702 m), weight 128.4 kg, last menstrual period 02/22/2018, SpO2 96 %, unknown if currently breastfeeding. Vitals:   10/05/18 0300 10/05/18 0330 10/05/18 0345 10/05/18 0349  BP:    121/74  Pulse:    94  Resp:    20  Temp:    97.7 F (36.5 C)  TempSrc:    Oral  SpO2: 98% 97% 96%   Weight:      Height:       Physical Examination:  General appearance - alert, well appearing, and in no distress Abdomen - soft, nontender, nondistended, no masses or organomegaly Fundal Height:  size equals dates Pelvic Exam:  examination not indicated Cervical Exam: Not evaluated Extremities: extremities normal, atraumatic, no cyanosis or edema with DTRs 2+ bilaterally Membranes:intact  Fetal Monitoring:  Baseline: 130 bpm, Variability: Good {> 6 bpm), Accelerations: Reactive and Decelerations: Absent   reactive  Labs:  Results for orders placed or performed during the hospital encounter of 10/03/18 (from the past 24 hour(s))  Glucose, capillary   Collection Time: 10/04/18 11:04 AM  Result Value Ref Range   Glucose-Capillary 127 (H) 70 - 99 mg/dL  Glucose, capillary   Collection Time: 10/04/18  5:07 PM  Result Value Ref Range   Glucose-Capillary 136 (H) 70 - 99 mg/dL  Glucose, capillary   Collection Time: 10/04/18  8:21 PM  Result Value Ref Range   Glucose-Capillary 151 (H) 70 - 99 mg/dL    Imaging Studies:     Medications:  Scheduled . aspirin  81 mg Oral Daily  . docusate sodium  100 mg Oral Daily  . enoxaparin (LOVENOX) injection  60 mg Subcutaneous Q24H  . ferrous sulfate  325 mg Oral BID WC  . fluticasone furoate-vilanterol  1 puff Inhalation Daily  . insulin aspart  0-14 Units Subcutaneous QID  . insulin NPH Human  14 Units Subcutaneous BID AC & HS  . labetalol  800 mg Oral TID  . montelukast  10 mg Oral QHS  . NIFEdipine  60 mg Oral BID  . pantoprazole  40 mg Oral Daily  . prenatal multivitamin  1 tablet Oral Q1200  . umeclidinium bromide  1 puff Inhalation Daily   I have reviewed the patient's current medications.  ASSESSMENT: [id number] [redacted]w[redacted]d with CHTN with SIPE, severe range Patient Active Problem List   Diagnosis Date Noted  . Headache in pregnancy, antepartum, third trimester 09/29/2018  . Gestational diabetes mellitus (GDM) affecting pregnancy, antepartum 09/05/2018  . Supervision of high risk pregnancy, antepartum 07/07/2018  . AMA (advanced maternal age) multigravida 6+, unspecified trimester 07/07/2018  . Chronic hypertension with superimposed severe preeclampsia 07/07/2018  . GERD (gastroesophageal reflux disease) 06/09/2018  . Severe persistent asthma without complication 37/90/2409  . HTN (hypertension) 12/29/2017  .  Obesity 12/29/2017    PLAN: - Patient completed BMX - Patient completed magnesium sulfate for 24 hours - BP stable and normal tensive - CBGs increased s/p BMZ, will increase NPH from 12 to 14 units BID  Kynsli Haapala 10/05/2018,7:39 AM

## 2018-10-06 ENCOUNTER — Inpatient Hospital Stay (HOSPITAL_COMMUNITY): Payer: Medicaid Other | Admitting: Anesthesiology

## 2018-10-06 DIAGNOSIS — O114 Pre-existing hypertension with pre-eclampsia, complicating childbirth: Secondary | ICD-10-CM | POA: Diagnosis not present

## 2018-10-06 LAB — OB RESULTS CONSOLE GBS: GBS: POSITIVE

## 2018-10-06 LAB — RPR: RPR Ser Ql: NONREACTIVE

## 2018-10-06 LAB — CBC
HCT: 31.4 % — ABNORMAL LOW (ref 36.0–46.0)
Hemoglobin: 9.6 g/dL — ABNORMAL LOW (ref 12.0–15.0)
MCH: 24.6 pg — ABNORMAL LOW (ref 26.0–34.0)
MCHC: 30.6 g/dL (ref 30.0–36.0)
MCV: 80.3 fL (ref 80.0–100.0)
Platelets: 274 10*3/uL (ref 150–400)
RBC: 3.91 MIL/uL (ref 3.87–5.11)
RDW: 12.8 % (ref 11.5–15.5)
WBC: 20.3 10*3/uL — ABNORMAL HIGH (ref 4.0–10.5)
nRBC: 0 % (ref 0.0–0.2)

## 2018-10-06 LAB — GLUCOSE, CAPILLARY
Glucose-Capillary: 88 mg/dL (ref 70–99)
Glucose-Capillary: 86 mg/dL (ref 70–99)
Glucose-Capillary: 92 mg/dL (ref 70–99)
Glucose-Capillary: 116 mg/dL — ABNORMAL HIGH (ref 70–99)
Glucose-Capillary: 78 mg/dL (ref 70–99)
Glucose-Capillary: 99 mg/dL (ref 70–99)

## 2018-10-06 MED ORDER — TERBUTALINE SULFATE 1 MG/ML IJ SOLN: 0.2500 mg | Freq: Once | INTRAMUSCULAR | Status: DC | PRN

## 2018-10-06 MED ORDER — OXYTOCIN 40 UNITS IN NORMAL SALINE INFUSION - SIMPLE MED
1.0000 m[IU]/min | INTRAVENOUS | Status: DC
  Administered 2018-10-06: 16:00:00 2 m[IU]/min via INTRAVENOUS
  Filled 2018-10-06: qty 1000

## 2018-10-06 MED ORDER — FENTANYL-BUPIVACAINE-NACL 0.5-0.125-0.9 MG/250ML-% EP SOLN
12.0000 mL/h | EPIDURAL | Status: DC | PRN
  Administered 2018-10-07: 03:00:00 12 mL/h via EPIDURAL
  Filled 2018-10-06 (×2): qty 250

## 2018-10-06 MED ORDER — SODIUM CHLORIDE (PF) 0.9 % IJ SOLN
INTRAMUSCULAR | Status: DC | PRN
  Administered 2018-10-06: 12 mL/h via EPIDURAL

## 2018-10-06 MED ORDER — MISOPROSTOL 25 MCG QUARTER TABLET
ORAL_TABLET | ORAL | Status: AC
  Filled 2018-10-06: qty 1

## 2018-10-06 MED ORDER — PHENYLEPHRINE 40 MCG/ML (10ML) SYRINGE FOR IV PUSH (FOR BLOOD PRESSURE SUPPORT): 80.0000 ug | PREFILLED_SYRINGE | INTRAVENOUS | Status: DC | PRN

## 2018-10-06 MED ORDER — DIPHENHYDRAMINE HCL 50 MG/ML IJ SOLN: 12.5000 mg | INTRAMUSCULAR | Status: DC | PRN

## 2018-10-06 MED ORDER — PHENYLEPHRINE 40 MCG/ML (10ML) SYRINGE FOR IV PUSH (FOR BLOOD PRESSURE SUPPORT)
80.0000 ug | PREFILLED_SYRINGE | INTRAVENOUS | Status: DC | PRN
  Filled 2018-10-06: qty 10

## 2018-10-06 MED ORDER — MISOPROSTOL 25 MCG QUARTER TABLET
25.0000 ug | ORAL_TABLET | ORAL | Status: DC | PRN
  Administered 2018-10-06: 12:00:00 25 ug via VAGINAL

## 2018-10-06 MED ORDER — LACTATED RINGERS IV SOLN: 500.0000 mL | Freq: Once | INTRAVENOUS | Status: DC

## 2018-10-06 MED ORDER — EPHEDRINE 5 MG/ML INJ: 10.0000 mg | INTRAVENOUS | Status: DC | PRN

## 2018-10-06 MED ORDER — LIDOCAINE HCL (PF) 1 % IJ SOLN
INTRAMUSCULAR | Status: DC | PRN
  Administered 2018-10-06: 5 mL via EPIDURAL

## 2018-10-06 NOTE — Progress Notes (Signed)
OB/GYN Faculty Practice: Labor Progress Note  Subjective:  No complaints. Epidural working well.   Objective:  BP 137/81   Pulse 85   Temp 98.5 F (36.9 C) (Oral)   Resp 20   Ht 5\' 7"  (1.702 m)   Wt 128.4 kg   LMP 02/22/2018   SpO2 94%   BMI 44.32 kg/m  Gen: Lying in bed comfortably.  Extremities: Moving spontaneously   CE: 5/thick/-2, cephalic  Assessment and Plan:  Jenna Stephens is a 43 y.o. [id number] at [redacted]w[redacted]d - Induction of labor due to preeclampsia and GDM.   Labor: Continuing to progress. AROM @ 5361 with clear fluid. Fetal head descended and lying just right of cervix. No umbilical cord palpated during exam. Distal cervix remains thick but proximal cervix soft.  IUPC placed. Contractions regular, every 3 minutes and adequate after IUPC placed.  . Pain control: Epidural . Anticipated MOD: NSVD . PPH Risk: medium (BMI >40)   Fetal Wellbeing: Cat I tracing . GBS Positive (06/15 0000)  - ampicillin . Continuous fetal monitoring  [name], M.D.  Family Medicine  PGY-1 10/06/2018 6:45 PM

## 2018-10-06 NOTE — Progress Notes (Signed)
Labor Progress Note  Subjective: In to check on patient. Pt is doing well. Reports that she has been able to get some rest. Reports some mild cramping that she does not need anything for at the moment. Pt denies HA, vision changes or RUQ pain. Support person in room with patient.   Objective: BP 130/75   Pulse 85   Temp 97.8 F (36.6 C) (Oral)   Resp 18   Ht 5\' 7"  (1.702 m)   Wt 128.4 kg   LMP 02/22/2018   SpO2 96%   BMI 44.32 kg/m  Gen: alert, cooperative, no distress Dilation: 1 Effacement (%): Thick Station: Ballotable Presentation: Vertex Exam by:: Glenice Bow, CNM  Assessment and Plan: 43 y.o. [id number] [redacted]w[redacted]d admitted for IOL for superimposed severe pre eclampsia.   Labor:  -- Foley bulb still in place -- Pt has received Cytotec x4 doses -- Mag 2g/hr infusing -- Continue labetalol 800 TID and procardia 60mg  BID -- NPH BID with SSI -- CBG q4 hours GBS positive; has received PCN x3 doses -- Pain control: IV pain medication, open to epidural  Fetal Well-Being:  -- Category 2; FHR 120, moderate variability, +accels, occasional variable; tracing overall reassuring -- Continuous fetal monitoring    Maryagnes Amos, SNM 3:57 AM

## 2018-10-06 NOTE — Progress Notes (Signed)
OB/GYN Faculty Practice: Labor Progress Note  Subjective:  Doing well. No complaints   Objective:  BP (!) 97/54   Pulse 83   Temp 98.4 F (36.9 C) (Oral)   Resp 20   Ht 5\' 7"  (1.702 m)   Wt 128.4 kg   LMP 02/22/2018   SpO2 94%   BMI 44.32 kg/m  Gen: Sitting in bed comfortably.  Extremities: Moving spontaneously. No swelling, erythema.  CE: Dilation: 4 Effacement (%): Thick Station: Ballotable Presentation: Vertex Exam by:: Dr. Maudie Mercury  Assessment and Plan:  Jenna Stephens is a 43 y.o. [phone] at [redacted]w[redacted]d - Induction of labor due to preeclampsia and gestational diabetes,  progressing well on pitocin  Labor: s/p cytotec x 5. FB placed four hours ago has fallen out. CE is 4cm, thick, posterior, and ballotable. Will start pitocin, 2x2.  Patient had epidural placed earlier this morning.  . Pain control: Epidural . Anticipated MOD: NSVD . PPH Risk: medium (BMI>40)   Fetal Wellbeing: Cat I tracing,  . GBS Positive (06/15 0000) - ampicillin . Continuous fetal monitoring  [name], M.D.  Family Medicine  PGY-1 10/06/2018 3:40 PM

## 2018-10-06 NOTE — Anesthesia Procedure Notes (Signed)
Epidural Patient location during procedure: OB Start time: 10/06/2018 10:52 AM End time: 10/06/2018 11:05 AM  Staffing Anesthesiologist: Barnet Glasgow, MD Performed: anesthesiologist   Preanesthetic Checklist Completed: patient identified, site marked, surgical consent, pre-op evaluation, timeout performed, IV checked, risks and benefits discussed and monitors and equipment checked  Epidural Patient position: sitting Prep: site prepped and draped and DuraPrep Patient monitoring: continuous pulse ox and blood pressure Approach: midline Location: L3-L4 Injection technique: LOR air  Needle:  Needle type: Tuohy  Needle gauge: 17 G Needle length: 9 cm and 9 Needle insertion depth: 9 cm Catheter type: closed end flexible Catheter size: 19 Gauge Catheter at skin depth: 15 cm Test dose: negative  Assessment Events: blood not aspirated, injection not painful, no injection resistance, negative IV test and no paresthesia  Additional Notes Patient identified. Risks/Benefits/Options discussed with patient including but not limited to bleeding, infection, nerve damage, paralysis, failed block, incomplete pain control, headache, blood pressure changes, nausea, vomiting, reactions to medication both or allergic, itching and postpartum back pain. Confirmed with bedside nurse the patient's most recent platelet count. Confirmed with patient that they are not currently taking any anticoagulation, have any bleeding history or any family history of bleeding disorders. Patient expressed understanding and wished to proceed. All questions were answered. Sterile technique was used throughout the entire procedure. Please see nursing notes for vital signs. Test dose was given through epidural needle and negative prior to continuing to dose epidural or start infusion. Warning signs of high block given to the patient including shortness of breath, tingling/numbness in hands, complete motor block, or any  concerning symptoms with instructions to call for help. Patient was given instructions on fall risk and not to get out of bed. All questions and concerns addressed with instructions to call with any issues. 1 Attempt (S) . Patient tolerated procedure well.

## 2018-10-06 NOTE — Progress Notes (Signed)
I have reviewed this chart and agree with the RN/CMA assessment and management.    Jeremian Whitby C Christia Domke, MD, FACOG Attending Physician, Faculty Practice [facility]  

## 2018-10-06 NOTE — Progress Notes (Signed)
LABOR PROGRESS NOTE  Jenna Stephens is a 43 y.o. [phone] at [redacted]w[redacted]d  admitted for IOL for Severe PEC   Subjective: Patient doing well, recently received fentanyl   Objective: BP 128/76   Pulse 86   Temp 98 F (36.7 C) (Oral)   Resp 18   Ht 5\' 7"  (1.702 m)   Wt 128.4 kg   LMP 02/22/2018   SpO2 96%   BMI 44.32 kg/m  or  Vitals:   10/06/18 0410 10/06/18 0506 10/06/18 0647 10/06/18 0744  BP: 135/73 138/66 131/78 128/76  Pulse: 79 83 83 86  Resp: 20 20 20 18   Temp:   98 F (36.7 C)   TempSrc:   Oral   SpO2:      Weight:      Height:        Foley bulb out at 0715- deferred cervical examination at this time until starting pitocin  FHT: baseline rate 125, moderate varibility, +accel, occasional variable decel Toco: 3-6  Labs: Lab Results  Component Value Date   WBC 17.9 (H) 10/05/2018   HGB 9.5 (L) 10/05/2018   HCT 30.7 (L) 10/05/2018   MCV 79.9 (L) 10/05/2018   PLT 267 10/05/2018    Patient Active Problem List   Diagnosis Date Noted  . Positive GBS test 10/05/2018  . Severe pre-eclampsia 10/05/2018  . Headache in pregnancy, antepartum, third trimester 09/29/2018  . Gestational diabetes mellitus (GDM) affecting pregnancy, antepartum 09/05/2018  . Supervision of high risk pregnancy, antepartum 07/07/2018  . AMA (advanced maternal age) multigravida 66+, unspecified trimester 07/07/2018  . Chronic hypertension with superimposed severe preeclampsia 07/07/2018  . GERD (gastroesophageal reflux disease) 06/09/2018  . Severe persistent asthma without complication 55/97/4163  . HTN (hypertension) 12/29/2017  . Obesity 12/29/2017    Assessment / Plan: 43 y.o. A4T3646 at [redacted]w[redacted]d here for IOL for Severe PEC   Labor: Plan to start pitocin after patient eats small meal  Fetal Wellbeing:  Cat II  Pain Control:  IV Fentanyl  Anticipated MOD:  SVD  Lajean Manes, CNM 10/06/2018, 7:58 AM

## 2018-10-06 NOTE — Anesthesia Preprocedure Evaluation (Signed)
Anesthesia Evaluation  Patient identified by MRN, date of birth, ID band Patient awake    Reviewed: Allergy & Precautions, NPO status , Patient's Chart, lab work & pertinent test results  Airway Mallampati: III  TM Distance: >3 FB Neck ROM: Full    Dental no notable dental hx.    Pulmonary asthma , former smoker,    Pulmonary exam normal breath sounds clear to auscultation       Cardiovascular Exercise Tolerance: Good hypertension, Normal cardiovascular exam Rhythm:Regular Rate:Normal  Gest HTN     Neuro/Psych  Headaches, Anxiety    GI/Hepatic GERD  ,  Endo/Other  diabetes  Renal/GU      Musculoskeletal   Abdominal (+) + obese,   Peds  Hematology  (+) Blood dyscrasia, anemia , Hgb 9.6 Plt 274   Anesthesia Other Findings   Reproductive/Obstetrics                             Lab Results  Component Value Date   CREATININE 0.70 10/05/2018   BUN 10 10/05/2018   NA 136 10/05/2018   K 3.9 10/05/2018   CL 106 10/05/2018   CO2 22 10/05/2018    Lab Results  Component Value Date   WBC 20.3 (H) 10/06/2018   HGB 9.6 (L) 10/06/2018   HCT 31.4 (L) 10/06/2018   MCV 80.3 10/06/2018   PLT 274 10/06/2018    Anesthesia Physical Anesthesia Plan  ASA: IV  Anesthesia Plan: Epidural   Post-op Pain Management:    Induction:   PONV Risk Score and Plan:   Airway Management Planned:   Additional Equipment:   Intra-op Plan:   Post-operative Plan:   Informed Consent: I have reviewed the patients History and Physical, chart, labs and discussed the procedure including the risks, benefits and alternatives for the proposed anesthesia with the patient or authorized representative who has indicated his/her understanding and acceptance.       Plan Discussed with: CRNA  Anesthesia Plan Comments: (Gest Htn, Asthma, for LEA)        Anesthesia Quick Evaluation

## 2018-10-06 NOTE — Progress Notes (Signed)
OB/GYN Faculty Practice: Labor Progress Note  Subjective:  Patient doing well. No complaints at this time.  Objective:  BP (!) 144/78   Pulse 92   Temp 98 F (36.7 C) (Oral)   Resp 20   Ht 5\' 7"  (1.702 m)   Wt 128.4 kg   LMP 02/22/2018   SpO2 96%   BMI 44.32 kg/m  Gen: Lying in bed comfortably  Extremities: Moving spontaneously. No BLEE, no erythema/swelling.  CE: Dilation: 1 Effacement (%): Thick Station: Ballotable Presentation: Vertex Exam by:: Glenice Bow, CNM  Assessment and Plan:  Jenna Stephens is a 43 y.o. [phone] at [redacted]w[redacted]d - Induction of labor due to gestational diabetes and severe PEC.   6/12: patient arrives around noon for severe PEC and admitted to ante. S/p BMZ x1 + started on 24 hours Mg (1600-1600).  6/13: Ante service. BMZ x 1.  6/14: Admitted to L&D for induction ~1400.  Mg + Penicillin. FB placed at 2300. Cytotec x 3 (2323)  6/15:  Foley bulb fell out around 0715; however, on recheck, patient's cervix remains posterior, thick, and 1.5cm. Replaced foley balloon at 11:30A. Cytotec x 2 (1130). Epidural placed at 1100.   Labor: Replaced foley balloon. Will monitor patient with cytotec q4 hours via alternating route.  . Pain control: Epidural . Anticipated MOD: NSVD . PPH Risk: med   Fetal Wellbeing:BL 135, mod var, +a/-d  . GBS Positive (06/15 0000) - penicillin  . Continuous fetal monitoring  HTN: Patient denies any new symptoms. BP: (94-149)/(50-101) 101/50 (06/15 1201)  Continue labetalol PO 800 mg TID, Nifedipine 60mg  BID, Hydralazing PRN, Labetalol PRN.   Fioricet PRN headache.   Can decrease mIVF if HTN  Asthma, stable: Continue Sinulair QHS, incruse ellipta and breo ellipta  GERD: continue PTX 40mg  daily   Anemia: Continue iron BID WC + docusate 100mg  daily   Diabetes: SSI - 0-14 unites,    Postpartum Planning . UDS, + THC   [name], M.D.  Family Medicine  PGY-1 10/06/2018 11:34 AM

## 2018-10-06 NOTE — Progress Notes (Signed)
Vitals:   10/06/18 2002 10/06/18 2032  BP: 130/71 131/72  Pulse: 80 82  Resp: 18   Temp:    SpO2:      Feeling pressure. No change in cx. MVUs >200, but ctx q 5 -6 minutes.  Pitocin at 8 mu/min, FHR Cat 1. Will increase pitocin until ctx closer together.

## 2018-10-07 ENCOUNTER — Inpatient Hospital Stay (HOSPITAL_COMMUNITY): Payer: Medicaid Other | Admitting: Anesthesiology

## 2018-10-07 ENCOUNTER — Encounter (HOSPITAL_COMMUNITY)
Admission: AD | Disposition: A | Discharge status: Home / Self Care | Payer: Self-pay | Source: Home / Self Care | Attending: Obstetrics & Gynecology

## 2018-10-07 DIAGNOSIS — Z302 Encounter for sterilization: Secondary | ICD-10-CM

## 2018-10-07 DIAGNOSIS — O1414 Severe pre-eclampsia complicating childbirth: Secondary | ICD-10-CM

## 2018-10-07 HISTORY — PX: TUBAL LIGATION: SHX77

## 2018-10-07 LAB — GLUCOSE, CAPILLARY
Glucose-Capillary: 122 mg/dL — ABNORMAL HIGH (ref 70–99)
Glucose-Capillary: 78 mg/dL (ref 70–99)
Glucose-Capillary: 79 mg/dL (ref 70–99)
Glucose-Capillary: 90 mg/dL (ref 70–99)

## 2018-10-07 LAB — BPAM RBC
Blood Product Expiration Date: 202006292359
Blood Product Expiration Date: 202006292359
Unit Type and Rh: 6200
Unit Type and Rh: 6200

## 2018-10-07 LAB — CBC
HCT: 34.8 % — ABNORMAL LOW (ref 36.0–46.0)
Hemoglobin: 10.5 g/dL — ABNORMAL LOW (ref 12.0–15.0)
MCH: 24.4 pg — ABNORMAL LOW (ref 26.0–34.0)
MCHC: 30.2 g/dL (ref 30.0–36.0)
MCV: 80.7 fL (ref 80.0–100.0)
Platelets: 257 10*3/uL (ref 150–400)
RBC: 4.31 MIL/uL (ref 3.87–5.11)
RDW: 12.8 % (ref 11.5–15.5)
WBC: 21.1 10*3/uL — ABNORMAL HIGH (ref 4.0–10.5)
nRBC: 0 % (ref 0.0–0.2)

## 2018-10-07 LAB — COMPREHENSIVE METABOLIC PANEL
ALT: 16 U/L (ref 0–44)
ALT: 14 U/L (ref 0–44)
AST: 30 U/L (ref 15–41)
AST: 16 U/L (ref 15–41)
Albumin: 2.7 g/dL — ABNORMAL LOW (ref 3.5–5.0)
Albumin: 2.7 g/dL — ABNORMAL LOW (ref 3.5–5.0)
Alkaline Phosphatase: 64 U/L (ref 38–126)
Alkaline Phosphatase: 68 U/L (ref 38–126)
Anion gap: 7 (ref 5–15)
Anion gap: 9 (ref 5–15)
BUN: 9 mg/dL (ref 6–20)
BUN: 6 mg/dL (ref 6–20)
CO2: 21 mmol/L — ABNORMAL LOW (ref 22–32)
CO2: 21 mmol/L — ABNORMAL LOW (ref 22–32)
Calcium: 7.6 mg/dL — ABNORMAL LOW (ref 8.9–10.3)
Calcium: 7.6 mg/dL — ABNORMAL LOW (ref 8.9–10.3)
Chloride: 107 mmol/L (ref 98–111)
Chloride: 105 mmol/L (ref 98–111)
Creatinine, Ser: 0.69 mg/dL (ref 0.44–1.00)
Creatinine, Ser: 0.69 mg/dL (ref 0.44–1.00)
GFR calc Af Amer: >60 mL/min (ref >60)
GFR calc Af Amer: >60 mL/min (ref >60)
GFR calc non Af Amer: >60 mL/min (ref >60)
GFR calc non Af Amer: >60 mL/min (ref >60)
Glucose, Bld: 84 mg/dL (ref 70–99)
Glucose, Bld: 84 mg/dL (ref 70–99)
Potassium: 4.3 mmol/L (ref 3.5–5.1)
Potassium: 3.8 mmol/L (ref 3.5–5.1)
Sodium: 135 mmol/L (ref 135–145)
Sodium: 135 mmol/L (ref 135–145)
Total Bilirubin: 1 mg/dL (ref 0.3–1.2)
Total Bilirubin: 0.6 mg/dL (ref 0.3–1.2)
Total Protein: 5.5 g/dL — ABNORMAL LOW (ref 6.5–8.1)
Total Protein: 5.6 g/dL — ABNORMAL LOW (ref 6.5–8.1)

## 2018-10-07 LAB — TYPE AND SCREEN
ABO/RH(D): A POS
Antibody Screen: POSITIVE
Unit division: 0
Unit division: 0

## 2018-10-07 LAB — MAGNESIUM
Magnesium: 4.5 mg/dL — ABNORMAL HIGH (ref 1.7–2.4)
Magnesium: 3.9 mg/dL — ABNORMAL HIGH (ref 1.7–2.4)

## 2018-10-07 SURGERY — LIGATION, FALLOPIAN TUBE, POSTPARTUM
Anesthesia: Choice

## 2018-10-07 SURGERY — LIGATION, FALLOPIAN TUBE, POSTPARTUM
Anesthesia: Epidural

## 2018-10-07 MED ORDER — LIDOCAINE-EPINEPHRINE (PF) 2 %-1:200000 IJ SOLN
INTRAMUSCULAR | Status: AC
  Filled 2018-10-07: qty 20

## 2018-10-07 MED ORDER — ZOLPIDEM TARTRATE 5 MG PO TABS: 5.0000 mg | ORAL_TABLET | Freq: Every evening | ORAL | Status: DC | PRN

## 2018-10-07 MED ORDER — ACETAMINOPHEN 325 MG PO TABS
650.0000 mg | ORAL_TABLET | ORAL | Status: DC | PRN
  Administered 2018-10-07: 09:00:00 650 mg via ORAL
  Filled 2018-10-07 (×2): qty 2

## 2018-10-07 MED ORDER — ACETAMINOPHEN 10 MG/ML IV SOLN
1000.0000 mg | Freq: Once | INTRAVENOUS | Status: AC
  Administered 2018-10-07: 21:00:00 1000 mg via INTRAVENOUS

## 2018-10-07 MED ORDER — UMECLIDINIUM BROMIDE 62.5 MCG/INH IN AEPB
1.0000 | INHALATION_SPRAY | Freq: Every day | RESPIRATORY_TRACT | Status: DC
  Administered 2018-10-07 – 2018-10-09 (×3): 1 via RESPIRATORY_TRACT
  Filled 2018-10-07: qty 7

## 2018-10-07 MED ORDER — WITCH HAZEL-GLYCERIN EX PADS: 1.0000 "application " | MEDICATED_PAD | CUTANEOUS | Status: DC | PRN

## 2018-10-07 MED ORDER — IBUPROFEN 600 MG PO TABS
600.0000 mg | ORAL_TABLET | Freq: Four times a day (QID) | ORAL | Status: DC
  Administered 2018-10-07 – 2018-10-09 (×6): 600 mg via ORAL
  Filled 2018-10-07 (×6): qty 1

## 2018-10-07 MED ORDER — FENTANYL CITRATE (PF) 100 MCG/2ML IJ SOLN
25.0000 ug | INTRAMUSCULAR | Status: DC | PRN
  Administered 2018-10-07: 20:00:00 50 ug via INTRAVENOUS

## 2018-10-07 MED ORDER — ONDANSETRON HCL 4 MG PO TABS: 4.0000 mg | ORAL_TABLET | ORAL | Status: DC | PRN

## 2018-10-07 MED ORDER — BUPIVACAINE HCL (PF) 0.25 % IJ SOLN
INTRAMUSCULAR | Status: DC | PRN
  Administered 2018-10-07: 20 mL

## 2018-10-07 MED ORDER — DOCUSATE SODIUM 100 MG PO CAPS: 100.0000 mg | ORAL_CAPSULE | Freq: Two times a day (BID) | ORAL | Status: DC

## 2018-10-07 MED ORDER — FENTANYL CITRATE (PF) 100 MCG/2ML IJ SOLN
INTRAMUSCULAR | Status: AC
  Filled 2018-10-07: qty 2

## 2018-10-07 MED ORDER — FERROUS SULFATE 325 (65 FE) MG PO TABS
325.0000 mg | ORAL_TABLET | Freq: Every day | ORAL | Status: DC
  Administered 2018-10-08 – 2018-10-09 (×2): 325 mg via ORAL
  Filled 2018-10-07 (×2): qty 1

## 2018-10-07 MED ORDER — MIDAZOLAM HCL 5 MG/5ML IJ SOLN
INTRAMUSCULAR | Status: DC | PRN
  Administered 2018-10-07: 2 mg via INTRAVENOUS

## 2018-10-07 MED ORDER — ACETAMINOPHEN 10 MG/ML IV SOLN
INTRAVENOUS | Status: DC | PRN
  Administered 2018-10-07: 1000 mg via INTRAVENOUS

## 2018-10-07 MED ORDER — BISACODYL 10 MG RE SUPP
10.0000 mg | Freq: Every day | RECTAL | Status: DC | PRN
  Filled 2018-10-07: qty 1

## 2018-10-07 MED ORDER — METHYLERGONOVINE MALEATE 0.2 MG PO TABS: 0.2000 mg | ORAL_TABLET | ORAL | Status: DC | PRN

## 2018-10-07 MED ORDER — CEFAZOLIN SODIUM-DEXTROSE 2-4 GM/100ML-% IV SOLN: 2.0000 g | INTRAVENOUS | Status: DC

## 2018-10-07 MED ORDER — SODIUM BICARBONATE 8.4 % IV SOLN
INTRAVENOUS | Status: DC | PRN
  Administered 2018-10-07: 5 mL via EPIDURAL
  Administered 2018-10-07: 3 mL via EPIDURAL
  Administered 2018-10-07: 7 mL via EPIDURAL
  Administered 2018-10-07: 5 mL via EPIDURAL

## 2018-10-07 MED ORDER — TETANUS-DIPHTH-ACELL PERTUSSIS 5-2.5-18.5 LF-MCG/0.5 IM SUSP: 0.5000 mL | Freq: Once | INTRAMUSCULAR | Status: DC

## 2018-10-07 MED ORDER — LACTATED RINGERS AMNIOINFUSION
INTRAVENOUS | Status: DC
  Administered 2018-10-07: 05:00:00 via INTRAUTERINE

## 2018-10-07 MED ORDER — METOCLOPRAMIDE HCL 10 MG PO TABS
10.0000 mg | ORAL_TABLET | Freq: Once | ORAL | Status: AC
  Administered 2018-10-07: 18:00:00 10 mg via ORAL
  Filled 2018-10-07: qty 1

## 2018-10-07 MED ORDER — BUPIVACAINE HCL (PF) 0.25 % IJ SOLN
INTRAMUSCULAR | Status: AC
  Filled 2018-10-07: qty 10

## 2018-10-07 MED ORDER — MEASLES, MUMPS & RUBELLA VAC IJ SOLR: 0.5000 mL | Freq: Once | INTRAMUSCULAR | Status: DC

## 2018-10-07 MED ORDER — PRENATAL MULTIVITAMIN CH
1.0000 | ORAL_TABLET | Freq: Every day | ORAL | Status: DC
  Administered 2018-10-07 – 2018-10-09 (×3): 1 via ORAL
  Filled 2018-10-07 (×3): qty 1

## 2018-10-07 MED ORDER — MIDAZOLAM HCL 2 MG/2ML IJ SOLN
INTRAMUSCULAR | Status: AC
  Filled 2018-10-07: qty 2

## 2018-10-07 MED ORDER — DIBUCAINE (PERIANAL) 1 % EX OINT: 1.0000 "application " | TOPICAL_OINTMENT | CUTANEOUS | Status: DC | PRN

## 2018-10-07 MED ORDER — FENTANYL CITRATE (PF) 100 MCG/2ML IJ SOLN
INTRAMUSCULAR | Status: DC | PRN
  Administered 2018-10-07: 100 ug via INTRAVENOUS

## 2018-10-07 MED ORDER — ACETAMINOPHEN 10 MG/ML IV SOLN
INTRAVENOUS | Status: AC
  Filled 2018-10-07: qty 100

## 2018-10-07 MED ORDER — ONDANSETRON HCL 4 MG/2ML IJ SOLN
INTRAMUSCULAR | Status: AC
  Filled 2018-10-07: qty 2

## 2018-10-07 MED ORDER — COCONUT OIL OIL: 1.0000 "application " | TOPICAL_OIL | Status: DC | PRN

## 2018-10-07 MED ORDER — NIFEDIPINE ER OSMOTIC RELEASE 30 MG PO TB24
60.0000 mg | ORAL_TABLET | Freq: Every day | ORAL | Status: DC
  Administered 2018-10-07 – 2018-10-08 (×2): 60 mg via ORAL
  Filled 2018-10-07 (×2): qty 2

## 2018-10-07 MED ORDER — DIPHENHYDRAMINE HCL 25 MG PO CAPS: 25.0000 mg | ORAL_CAPSULE | Freq: Four times a day (QID) | ORAL | Status: DC | PRN

## 2018-10-07 MED ORDER — TRANEXAMIC ACID-NACL 1000-0.7 MG/100ML-% IV SOLN
INTRAVENOUS | Status: AC
  Administered 2018-10-07: 06:00:00 1000 mg via INTRAVENOUS
  Filled 2018-10-07: qty 100

## 2018-10-07 MED ORDER — SODIUM CHLORIDE 0.9 % IV SOLN: 2.0000 g | Freq: Once | INTRAVENOUS | Status: DC

## 2018-10-07 MED ORDER — DEXTROSE 5 % IV SOLN: 3.0000 g | INTRAVENOUS | Status: DC

## 2018-10-07 MED ORDER — SIMETHICONE 80 MG PO CHEW: 80.0000 mg | CHEWABLE_TABLET | ORAL | Status: DC | PRN

## 2018-10-07 MED ORDER — DOCUSATE SODIUM 100 MG PO CAPS: 100.0000 mg | ORAL_CAPSULE | Freq: Two times a day (BID) | ORAL | Status: DC | PRN

## 2018-10-07 MED ORDER — MISOPROSTOL 200 MCG PO TABS
ORAL_TABLET | ORAL | Status: AC
  Administered 2018-10-07: 06:00:00 1000 ug
  Filled 2018-10-07: qty 4

## 2018-10-07 MED ORDER — METHYLERGONOVINE MALEATE 0.2 MG/ML IJ SOLN
INTRAMUSCULAR | Status: AC
  Administered 2018-10-07: 06:00:00 0.2 mg
  Filled 2018-10-07: qty 1

## 2018-10-07 MED ORDER — ONDANSETRON HCL 4 MG/2ML IJ SOLN
INTRAMUSCULAR | Status: DC | PRN
  Administered 2018-10-07: 4 mg via INTRAVENOUS

## 2018-10-07 MED ORDER — FERROUS SULFATE 325 (65 FE) MG PO TABS
325.0000 mg | ORAL_TABLET | Freq: Two times a day (BID) | ORAL | Status: DC
  Administered 2018-10-07: 07:00:00 325 mg via ORAL
  Filled 2018-10-07: qty 1

## 2018-10-07 MED ORDER — ENALAPRIL MALEATE 10 MG PO TABS
10.0000 mg | ORAL_TABLET | Freq: Every day | ORAL | Status: DC
  Administered 2018-10-07 – 2018-10-09 (×3): 10 mg via ORAL
  Filled 2018-10-07 (×4): qty 1

## 2018-10-07 MED ORDER — METHYLERGONOVINE MALEATE 0.2 MG/ML IJ SOLN: 0.2000 mg | Freq: Once | INTRAMUSCULAR | Status: DC

## 2018-10-07 MED ORDER — FLEET ENEMA 7-19 GM/118ML RE ENEM: 1.0000 | ENEMA | Freq: Every day | RECTAL | Status: DC | PRN

## 2018-10-07 MED ORDER — TRANEXAMIC ACID-NACL 1000-0.7 MG/100ML-% IV SOLN
1000.0000 mg | INTRAVENOUS | Status: DC
  Administered 2018-10-07: 06:00:00 1000 mg via INTRAVENOUS

## 2018-10-07 MED ORDER — FLUTICASONE FUROATE-VILANTEROL 200-25 MCG/INH IN AEPB
1.0000 | INHALATION_SPRAY | Freq: Every day | RESPIRATORY_TRACT | Status: DC
  Administered 2018-10-07 – 2018-10-09 (×3): 1 via RESPIRATORY_TRACT
  Filled 2018-10-07: qty 28

## 2018-10-07 MED ORDER — METHYLERGONOVINE MALEATE 0.2 MG/ML IJ SOLN: 0.2000 mg | INTRAMUSCULAR | Status: DC | PRN

## 2018-10-07 MED ORDER — MISOPROSTOL 200 MCG PO TABS: 1000.0000 ug | ORAL_TABLET | Freq: Once | ORAL | Status: DC

## 2018-10-07 MED ORDER — FAMOTIDINE 20 MG PO TABS
40.0000 mg | ORAL_TABLET | Freq: Once | ORAL | Status: AC
  Administered 2018-10-07: 18:00:00 40 mg via ORAL
  Filled 2018-10-07: qty 2

## 2018-10-07 MED ORDER — MAGNESIUM SULFATE 40 G IN LACTATED RINGERS - SIMPLE
2.0000 g/h | INTRAVENOUS | Status: DC
  Administered 2018-10-07 – 2018-10-08 (×3): 2 g/h via INTRAVENOUS
  Filled 2018-10-07 (×2): qty 500

## 2018-10-07 MED ORDER — ONDANSETRON HCL 4 MG/2ML IJ SOLN: 4.0000 mg | INTRAMUSCULAR | Status: DC | PRN

## 2018-10-07 MED ORDER — OXYCODONE-ACETAMINOPHEN 5-325 MG PO TABS
1.0000 | ORAL_TABLET | ORAL | Status: DC | PRN
  Administered 2018-10-08 (×4): 1 via ORAL
  Filled 2018-10-07 (×4): qty 1

## 2018-10-07 MED ORDER — BENZOCAINE-MENTHOL 20-0.5 % EX AERO: 1.0000 "application " | INHALATION_SPRAY | CUTANEOUS | Status: DC | PRN

## 2018-10-07 SURGICAL SUPPLY — 22 items
BLADE SURG 11 STRL SS (BLADE) ×3 IMPLANT
CLIP FILSHIE TUBAL LIGA STRL (Clip) ×5 IMPLANT
DRSG OPSITE POSTOP 3X4 (GAUZE/BANDAGES/DRESSINGS) ×3 IMPLANT
DURAPREP 26ML APPLICATOR (WOUND CARE) ×3 IMPLANT
GLOVE BIOGEL PI IND STRL 7.0 (GLOVE) ×1 IMPLANT
GLOVE BIOGEL PI IND STRL 7.5 (GLOVE) ×1 IMPLANT
GLOVE BIOGEL PI INDICATOR 7.0 (GLOVE) ×2
GLOVE BIOGEL PI INDICATOR 7.5 (GLOVE) ×2
GLOVE ECLIPSE 7.5 STRL STRAW (GLOVE) ×3 IMPLANT
GOWN STRL REUS W/TWL LRG LVL3 (GOWN DISPOSABLE) ×6 IMPLANT
HIBICLENS CHG 4% 4OZ BTL (MISCELLANEOUS) ×3 IMPLANT
NEEDLE HYPO 22GX1.5 SAFETY (NEEDLE) ×3 IMPLANT
NS IRRIG 1000ML POUR BTL (IV SOLUTION) ×3 IMPLANT
PACK ABDOMINAL MINOR (CUSTOM PROCEDURE TRAY) ×3 IMPLANT
PROTECTOR NERVE ULNAR (MISCELLANEOUS) ×3 IMPLANT
SPONGE LAP 4X18 RFD (DISPOSABLE) IMPLANT
SUT VICRYL 0 UR6 27IN ABS (SUTURE) ×3 IMPLANT
SUT VICRYL 4-0 PS2 18IN ABS (SUTURE) ×3 IMPLANT
SYR CONTROL 10ML LL (SYRINGE) ×3 IMPLANT
TOWEL OR 17X24 6PK STRL BLUE (TOWEL DISPOSABLE) ×6 IMPLANT
TRAY FOLEY W/BAG SLVR 14FR (SET/KITS/TRAYS/PACK) ×3 IMPLANT
WATER STERILE IRR 1000ML POUR (IV SOLUTION) ×2 IMPLANT

## 2018-10-07 NOTE — Progress Notes (Signed)
Call to Dr. Ilda Basset re: CBG checks, insulin, and asthma meds. Dr. Ilda Basset will review chart and write orders.   Call from Blood bank re: AntiLewis A & B antibodies. Reminded blood bank of PPH and upcoming surgical procedure this afternoon. Units to be crossmatched.

## 2018-10-07 NOTE — Progress Notes (Signed)
Pt currently in NICU. 2 hr post-prandial CBG checked, result 90.

## 2018-10-07 NOTE — Consult Note (Signed)
Neonatology Note:   Attendance at Delivery:    I was asked by Dr. Moshe Cipro to attend this vaginal delivery at 32 3/7 weeks C/S due to IOL for pre-eclampsia. The mother is a [id number], GBS + aIAP (PCN) with good prenatal care complicated by [facility] with superimposed preeclampsia (on Mag, Labetalol, nifedipine), GDM (on insulin), tobacco and THC use, and positive AntiLewis A & B antibodies.  BTMZ complete.  ROM 12 hours before delivery, fluid clear. Infant somewhat vigorous with minimal spontaneous cry and tone.  Baby brought to warmer immediately where she was warmed, dried, and stimulated.  CPAP was initiated and then due to poor respiratory effort and waning HR, PPV was started.  Baby responded well to increased pressures.  SAO2 monitored and fio2 adjusted.  Transitioned to CPAP at ~3-4 minutes of life. Ap 4/8. Lungs clearing to ausc in DR on CPAP 6cm and ~30%. To NICU for management of RDS and prematurity.accompanied by dad.  He was updated at baby's bedside and I updated both parents at mother's bedside.  She had intended to use formula however after discussion she agreed to pump and use donor BM.    Monia Sabal Katherina Mires, MD

## 2018-10-07 NOTE — Progress Notes (Signed)
Vitals:   10/07/18 0431 10/07/18 0501  BP: (!) 92/47 (!) 105/55  Pulse: 83 78  Resp:  18  Temp:  98.2 F (36.8 C)  SpO2:     Comfortable w/epidural, some pressure w/ctx.  FHR 120s, variable/early decels, some to 30-50.  Moderate variability in bt ctx, + scalp stim. MVUs >200, ctx q 1.5-2 minutes  (had been q 4-5 30 minutes ago). Amnioinfusion in progress. Cx 5-6/90/-2 (to 0 w/ctx), baby LOP per [place].  PItocin at 18 mu/min, will turn off. Dr. Harolyn Rutherford reviewing strip. Last 3 ctx prolonged deep variables. Will give terb.

## 2018-10-07 NOTE — Discharge Summary (Signed)
Postpartum Discharge Summary     Patient Name: Jenna Stephens DOB: 07-13-1975 MRN: [id number]  Date of admission: 10/03/2018 Delivering Provider: Renee Harder   Date of discharge: 10/09/2018  Admitting diagnosis: Pregnancy at 31/6. Superimposed severe pre-eclampsia (BP, HA) on cHTN.  Secondary diagnosis:  GDMa2, BMI 40s, Asthma (on neds), GERD, AMA, Anti-lewis antibodies     Discharge diagnosis: Preterm Pregnancy Delivered                                                                                                Post partum procedures:postpartum tubal ligation  Augmentation: AROM, Pitocin, Cytotec and Foley Balloon  Complications: precipitous delivery  Hospital course:  Given persistent and worsening HA, decision made to proceed with delivery; pt received 24 hours of Mg on admission and BMZ course on HD#1 and HD#2. Patient had an uncomplicated PP course. She received 24 hours of PP Mg and her CBGs were normal on no meds. She was transitioned to PO meds for her HTN.   Magnesium Sulfate recieved: Yes BMZ received: Yes  Physical exam  Vitals:   10/08/18 2037 10/09/18 0011 10/09/18 0420 10/09/18 0759  BP: (!) 149/91 139/79 (!) 143/77 140/87  Pulse: 82 70 76 82  Resp: 18 18 20 18   Temp: 98.4 F (36.9 C) 98.2 F (36.8 C) 98 F (36.7 C) 97.9 F (36.6 C)  TempSrc: Oral Oral Oral Oral  SpO2: 97% 100% 98% 100%  Weight:      Height:       General: alert Lochia: appropriate Uterine Fundus: obese, soft, nttp.  Incision: Dressing is clean, dry, and intact  Labs: Lab Results  Component Value Date   WBC 21.1 (H) 10/07/2018   HGB 10.5 (L) 10/07/2018   HCT 34.8 (L) 10/07/2018   MCV 80.7 10/07/2018   PLT 257 10/07/2018   CMP Latest Ref Rng & Units 10/07/2018  Glucose 70 - 99 mg/dL 84  BUN 6 - 20 mg/dL 6  Creatinine 0.44 - 1.00 mg/dL 0.69  Sodium 135 - 145 mmol/L 135  Potassium 3.5 - 5.1 mmol/L 3.8  Chloride 98 - 111 mmol/L 105  CO2 22 - 32 mmol/L 21(L)   Calcium 8.9 - 10.3 mg/dL 7.6(L)  Total Protein 6.5 - 8.1 g/dL 5.6(L)  Total Bilirubin 0.3 - 1.2 mg/dL 0.6  Alkaline Phos 38 - 126 U/L 68  AST 15 - 41 U/L 16  ALT 0 - 44 U/L 14    Discharge instruction: per After Visit Summary and "Baby and Me Booklet".  After visit meds:  Allergies as of 10/09/2018   No Known Allergies     Medication List    STOP taking these medications   Accu-Chek FastClix Lancets Misc   Accu-Chek Guide test strip Generic drug: glucose blood   Accu-Chek Nano SmartView w/Device Kit   aspirin 81 MG chewable tablet   insulin NPH Human 100 UNIT/ML injection Commonly known as: NOVOLIN N   Insulin Syringe-Needle U-100 30G X 5/16" 0.5 ML Misc Commonly known as: Safety Insulin Syringes   labetalol 300 MG tablet Commonly known as: NORMODYNE  TAKE these medications   acetaminophen 325 MG tablet Commonly known as: Tylenol Take 2 tablets (650 mg total) by mouth every 4 (four) hours as needed (for pain scale < 4).   albuterol 108 (90 Base) MCG/ACT inhaler Commonly known as: VENTOLIN HFA Inhale 2 puffs into the lungs every 6 (six) hours as needed for wheezing or shortness of breath.   enalapril 10 MG tablet Commonly known as: VASOTEC Take 1 tablet (10 mg total) by mouth daily.   ferrous sulfate 325 (65 FE) MG tablet Take 1 tablet (325 mg total) by mouth daily with breakfast for 30 days. What changed: how much to take   fluticasone furoate-vilanterol 200-25 MCG/INH Aepb Commonly known as: Breo Ellipta Inhale 1 puff into the lungs daily.   ibuprofen 600 MG tablet Commonly known as: ADVIL Take 1 tablet (600 mg total) by mouth every 6 (six) hours as needed.   lansoprazole 30 MG capsule Commonly known as: PREVACID Take 1 capsule (30 mg total) by mouth daily at 12 noon.   montelukast 10 MG tablet Commonly known as: SINGULAIR Take 1 tablet (10 mg total) by mouth daily.   NIFEdipine 30 MG 24 hr tablet Commonly known as:  PROCARDIA-XL/NIFEDICAL-XL Take 2 tablets (60 mg total) by mouth at bedtime. What changed: when to take this   oxyCODONE-acetaminophen 5-325 MG tablet Commonly known as: PERCOCET/ROXICET Take 1 tablet by mouth every 8 (eight) hours as needed for moderate pain.   polyethylene glycol 17 g packet Commonly known as: MIRALAX / GLYCOLAX Take 17 g by mouth daily for 30 days.   prenatal multivitamin Tabs tablet Take 1 tablet by mouth daily at 12 noon.   promethazine 25 MG tablet Commonly known as: PHENERGAN Take 1 tablet by mouth 2 (two) times daily.   umeclidinium bromide 62.5 MCG/INH Aepb Commonly known as: Incruse Ellipta Inhale 1 puff into the lungs daily.       Diet: routine diet  Activity: Advance as tolerated. Pelvic rest for 6 weeks.   Outpatient follow up:1 week Follow up Appt: Future Appointments  Date Time Provider [place]  10/14/2018  9:20 AM [facility] [place]  11/04/2018  8:15 AM Wende Mott, [place]   Follow up Visit: Lupus for [facility]. Go in 5 day(s).   Specialty: Obstetrics and Gynecology Why: incision and blood pressure check Contact information: [address] [place], [place] [id number] [place] [address] [phone]           Please schedule this patient for Postpartum visit in: 4 weeks with the following provider: Any provider For C/S patients schedule nurse incision check in weeks 2 weeks: yes High risk pregnancy complicated by: GDM, HTN, PreE Delivery mode:  SVD Anticipated Birth Control:  Plans Interval BTL PP Procedures needed: BP check  Schedule Integrated BH visit: no      Newborn Data: Live born female  Birth Weight:   APGAR: ,   Newborn Delivery   Birth date/time: 10/07/2018 05:29:00 Delivery type: Vaginal, Spontaneous      Baby:  Died Nov 07, 2018  Disposition:morgue   2018/11/07 Aletha Halim, MD

## 2018-10-07 NOTE — Progress Notes (Signed)
Labor Progress Note  Subjective: Strip review.   Objective: BP 130/75   Pulse 78   Temp 97.8 F (36.6 C) (Oral)   Resp 20   Ht 5\' 7"  (1.702 m)   Wt 128.4 kg   LMP 02/22/2018   SpO2 94%   BMI 44.32 kg/m   Dilation: 5 Effacement (%): 40, 50 Station: -3 Presentation: Vertex Exam by:: Bridgette Habermann SCNM  Assessment and Plan: 43 y.o. [id number] [redacted]w[redacted]d admitted for IOL for severe PEC.  Labor: -- Inadequate contraction pattern -- Continue to increase Pitocin  -- Pain control: epidural -- PPH Risk: high  Fetal Well-Being:  -- Category 2; FHR 115; minimal/moderate variability, +accels, +variable decels -- Continuous fetal monitoring    Maryagnes Amos, SNM 12:41 AM

## 2018-10-07 NOTE — Progress Notes (Signed)
Procedure tentatively scheduled at 1800 today.  Preoperative orders placed, NPO after 1000 today. Patient and RN informed.  Verita Schneiders, MD

## 2018-10-07 NOTE — Op Note (Signed)
Jenna Stephens 10/07/2018  PREOPERATIVE DIAGNOSIS:  Undesired fertility  POSTOPERATIVE DIAGNOSIS:  Undesired fertility  PROCEDURE:  Postpartum Bilateral Tubal Sterilization using Filshie Clips   SURGEON:  Dr Loma Boston  ANESTHESIA:  Epidural  COMPLICATIONS:  None immediate.  ESTIMATED BLOOD LOSS:  Less than 20cc.  FLUIDS: 200 mL LR.  URINE OUTPUT:  Unmeasured of clear urine.  INDICATIONS: 43 y.o. yo [id number]  with undesired fertility,status post vaginal delivery, desires permanent sterilization. Risks and benefits of procedure discussed with patient including permanence of method, bleeding, infection, injury to surrounding organs and need for additional procedures. Risk failure of 0.5-1% with increased risk of ectopic gestation if pregnancy occurs was also discussed with patient.   FINDINGS:  Normal uterus, tubes, and ovaries.  TECHNIQUE:  The patient was taken to the operating room where her epidural anesthesia was dosed up to surgical level and found to be adequate.  She was then placed in the dorsal supine position and prepped and draped in sterile fashion.  After an adequate timeout was performed, attention was turned to the patient's abdomen where a small transverse skin incision was made under the umbilical fold. The incision was taken down to the layer of fascia using the scalpel, and fascia was incised, and extended bilaterally using Mayo scissors. The peritoneum was entered in a sharp fashion. Attention was then turned to the patient's uterus, and left fallopian tube was identified and followed out to the fimbriated end.  A Filshie clip was placed on the left fallopian tube about 2 cm from the cornual attachment, with care given to incorporate the underlying mesosalpinx.  A similar process was carried out on the rightl side allowing for bilateral tubal sterilization.  Good hemostasis was noted overall.  Local analgesia was drizzled on both operative sites.The instruments were  then removed from the patient's abdomen and the fascial incision was repaired with 0 Vicryl, and the skin was closed with a 3-0 Monocryl subcuticular stitch. The patient tolerated the procedure well.  Sponge, lap, and needle counts were correct times two.  The patient was then taken to the recovery room awake, extubated and in stable condition.   Truett Mainland, DO 10/07/2018 7:38 PM

## 2018-10-07 NOTE — Progress Notes (Signed)
Faculty Practice OB/GYN Attending Note  43 y.o. [phone] s/p preterm vaginal delivery at [redacted]w[redacted]d after IOL for [facility] with superimposed severe preeclampsia, who desires permanent sterilization. Medicaid papers had been signed on 09/04/2018.  Other reversible forms of contraception including more effective LARCs such as IUD or Nexplanon were discussed with patient; she declines all other modalities. Her FOB also declined vasectomy, after being counseled about this procedure being less invasive and more effective.  Patient was also given the option of an interval laparoscopic tubal ligation which slightly increases the efficacy and is less invasive but she declined.  Details of postpartum tubal sterilization discussed in detail.  Risks of procedure discussed with patient including but not limited to: risk of regret, permanence of method, bleeding, infection, injury to surrounding organs and need for additional procedures.  Failure risk of about 1-2% with increased risk of ectopic gestation if pregnancy occurs was also discussed with patient.  Also discussed possibility of post-tubal pain syndrome. Patient verbalized understanding of these risks and wants to proceed with sterilization.  Written informed consent obtained.  OR will be called and the procedure may be scheduled for later today or tomorrow depending on OR availability.  Patien understands that her procedure may be delayed by any cases coming from L&D or MAU, or any other urgent events in [facility] as this is an elective procedure.    NPO and other preoperative orders to be placed once the operative time is known.  Will continue close observation and postpartum care as ordered.    Verita Schneiders, MD, [place] for [employer], [place]

## 2018-10-07 NOTE — Progress Notes (Signed)
Labor Progress Note  Subjective: Strip review. Pt sleeping.   Objective: BP (!) 98/49   Pulse 74   Temp 97.9 F (36.6 C) (Oral)   Resp 18   Ht 5\' 7"  (1.702 m)   Wt 128.4 kg   LMP 02/22/2018   SpO2 94%   BMI 44.32 kg/m  Gen: no distress Dilation: 5 Effacement (%): 40, 50 Station: -3 Presentation: Vertex Exam by:: Bridgette Habermann SCNM  Assessment and Plan: 43 y.o. [id number] [redacted]w[redacted]d admitted for IOL for severe PEC.  Labor:  -- STAT Mag level and CMP ordered as patient urine output only 15cc last hour -- Continue pitocin titration -- Pain control: epidural  Fetal Well-Being:  -- Category 2 tracing -- Continuous fetal monitoring    Maryagnes Amos, SNM 2:12 AM

## 2018-10-07 NOTE — Transfer of Care (Signed)
Immediate Anesthesia Transfer of Care Note  Patient: Jenna Stephens  Procedure(s) Performed: POST PARTUM TUBAL LIGATION (N/A )  Patient Location: PACU  Anesthesia Type:Epidural  Level of Consciousness: awake, alert  and oriented  Airway & Oxygen Therapy: Patient Spontanous Breathing  Post-op Assessment: Report given to RN and Post -op Vital signs reviewed and stable  Post vital signs: Reviewed and stable  Last Vitals:  Vitals Value Taken Time  BP 131/77 10/07/18 1945  Temp    Pulse 79 10/07/18 1947  Resp 19 10/07/18 1947  SpO2 100 % 10/07/18 1947  Vitals shown include unvalidated device data.  Last Pain:  Vitals:   10/07/18 1737  TempSrc:   PainSc: 0-No pain      Patients Stated Pain Goal: 2 (93/23/55 7322)  Complications: No apparent anesthesia complications

## 2018-10-07 NOTE — Anesthesia Preprocedure Evaluation (Signed)
Anesthesia Evaluation  Patient identified by MRN, date of birth, ID band Patient awake    Reviewed: Allergy & Precautions, NPO status , Patient's Chart, lab work & pertinent test results  Airway Mallampati: III  TM Distance: >3 FB Neck ROM: Full    Dental no notable dental hx.    Pulmonary asthma , former smoker,    Pulmonary exam normal breath sounds clear to auscultation       Cardiovascular hypertension, negative cardio ROS Normal cardiovascular exam Rhythm:Regular Rate:Normal     Neuro/Psych negative neurological ROS  negative psych ROS   GI/Hepatic negative GI ROS, Neg liver ROS,   Endo/Other  diabetes, GestationalMorbid obesity  Renal/GU negative Renal ROS  negative genitourinary   Musculoskeletal negative musculoskeletal ROS (+)   Abdominal (+) + obese,   Peds  Hematology  (+) Blood dyscrasia, anemia ,   Anesthesia Other Findings   Reproductive/Obstetrics                             Anesthesia Physical Anesthesia Plan  ASA: III  Anesthesia Plan: Epidural   Post-op Pain Management:    Induction: Intravenous  PONV Risk Score and Plan: 2 and Treatment may vary due to age or medical condition  Airway Management Planned: Natural Airway and Simple Face Mask  Additional Equipment:   Intra-op Plan:   Post-operative Plan:   Informed Consent: I have reviewed the patients History and Physical, chart, labs and discussed the procedure including the risks, benefits and alternatives for the proposed anesthesia with the patient or authorized representative who has indicated his/her understanding and acceptance.     Dental advisory given  Plan Discussed with: CRNA  Anesthesia Plan Comments:         Anesthesia Quick Evaluation

## 2018-10-07 NOTE — Anesthesia Procedure Notes (Signed)
Date/Time: 10/07/2018 6:47 PM Performed by: Alvy Bimler, CRNA Oxygen Delivery Method: Nasal cannula

## 2018-10-07 NOTE — Plan of Care (Signed)
Pt. Doing well post delivery. NPO for BTL (consents signed). Has visited baby in NICU.

## 2018-10-07 NOTE — Lactation Note (Signed)
This note was copied from a baby's chart. Lactation Consultation Note  Patient Name: Jenna Stephens [id number] Date: 10/07/2018 Reason for consult: Initial assessment;Preterm <34wks;Maternal endocrine disorder;Infant < 6lbs;NICU baby Type of Endocrine Disorder?: Diabetes  P2 mother whose infant is now 76 hours old and in the NICU.  This is a preterm baby at 32+3 weeks weighing <6 lbs.  Mother had originally planned on using formula, but, after speaking with the neonatologist has decided to pump and bottle feed.  She does not wish to breast feed.  Offered to initiate the DEBP for mother and she accepted.  Pump parts, assembly, disassembly and cleaning reviewed.  #24 flange is appropriate at this time.  I did advise mother to be mindful of flange size since she may have to move up to the #27 in a day or two.  Mother aware.  Colostrum containers provided and milk storage times reviewed.  RN will obtain labels from the NICU and mother knows to time/date all EBM she obtains.  RN will also review hand expression and I suggested mother incorporate hand expression before/after pumping to help increase milk supply.  Mother appreciative and stated, "I can do this!"    Mom made aware of O/P services, breastfeeding support groups, community resources, and our phone # for post-discharge questions. "Providing Breast Milk For Your Baby in the NICU" booklet given.  Mother does not have private insurance or [place].  She is planning on purchasing a DEBP for home use.  Information given about pump rental from the gift shop as an option.  Father present but sleeping on couch.  Mother will review information with father when he awakens.   Maternal Data Formula Feeding for Exclusion: No Has patient been taught Hand Expression?: No(RN to show mother after pumping) Does the patient have breastfeeding experience prior to this delivery?: No  Feeding    LATCH Score                   Interventions     Lactation Tools Discussed/Used WIC Program: No Pump Review: Setup, frequency, and cleaning;Milk Storage Initiated by:: Camar Guyton Date initiated:: 10/07/18   Consult Status Consult Status: Follow-up Date: 10/08/18 Follow-up type: In-patient    Little Ishikawa 10/07/2018, 12:56 PM

## 2018-10-08 ENCOUNTER — Other Ambulatory Visit: Payer: Medicaid Other

## 2018-10-08 DIAGNOSIS — O361990 Maternal care for other isoimmunization, unspecified trimester, not applicable or unspecified: Secondary | ICD-10-CM | POA: Diagnosis present

## 2018-10-08 MED ORDER — POLYETHYLENE GLYCOL 3350 17 G PO PACK: 17.0000 g | PACK | Freq: Every day | ORAL | Status: DC

## 2018-10-08 NOTE — Anesthesia Postprocedure Evaluation (Signed)
Anesthesia Post Note  Patient: Jenna Stephens  Procedure(s) Performed: AN AD HOC LABOR EPIDURAL     Patient location during evaluation: Mother Baby Anesthesia Type: Epidural Level of consciousness: awake and alert Pain management: pain level controlled Vital Signs Assessment: post-procedure vital signs reviewed and stable Respiratory status: spontaneous breathing, nonlabored ventilation and respiratory function stable Cardiovascular status: stable Postop Assessment: no headache, no backache and epidural receding Anesthetic complications: no    Last Vitals:  Vitals:   10/08/18 0941 10/08/18 1127  BP: (!) 150/86 (!) 155/86  Pulse: 82 98  Resp: 18 20  Temp: 36.6 C 36.7 C  SpO2: 99% 100%    Last Pain:  Vitals:   10/08/18 1127  TempSrc: Oral  PainSc:    Pain Goal: Patients Stated Pain Goal: 2 (10/08/18 1044)                 Demetres Prochnow

## 2018-10-08 NOTE — Progress Notes (Signed)
Daily Postpartum Note  Admission Date: 10/03/2018 Current Date: 10/08/2018 10:47 AM  Jenna Stephens is a 43 y.o. [id number] HD#6/PPD#1 s/p SVD/intact perineum @ 57w4dadmitted for pre-eclampsia with severe features (BP, HA) superimposed on cHTN  Pregnancy complicated by: Patient Active Problem List   Diagnosis Date Noted  . 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 Positive GBS test 10/05/2018  . Severe pre-eclampsia 10/05/2018  . Headache in pregnancy, antepartum, third trimester 09/29/2018  . Gestational diabetes mellitus (GDM) affecting pregnancy, antepartum 09/05/2018  . Supervision of high risk pregnancy, antepartum 07/07/2018  . AMA (advanced maternal age) multigravida 39+ unspecified trimester 07/07/2018  . Chronic hypertension with superimposed severe preeclampsia 07/07/2018  . GERD (gastroesophageal reflux disease) 06/09/2018  . Severe persistent asthma without complication 178/58/8502 . HTN (hypertension) 12/29/2017  . Obesity 12/29/2017    Overnight/24hr events:  none  Subjective:  No s/s of pre-eclampsia, +flatus and ambulation.   Objective:    Current Vital Signs 24h Vital Sign Ranges  T 97.9 F (36.6 C) Temp  Avg: 98 F (36.7 C)  Min: 97.5 F (36.4 C)  Max: 98.3 F (36.8 C)  BP (!) 150/86 BP  Min: 118/92  Max: 150/86  HR 82 Pulse  Avg: 77.2  Min: 71  Max: 98  RR 18 Resp  Avg: 17.5  Min: 6  Max: 25  SaO2 99 % Room Air SpO2  Avg: 98.1 %  Min: 92 %  Max: 100 %       24 Hour I/O Current Shift I/O  Time Ins Outs 06/16 0701 - 06/17 0700 In: 3636.5 [P.O.:855; I.V.:2781.5] Out: 4005 [Urine:4000] 06/17 0701 - 06/17 1900 In: 240 [P.O.:240] Out: -    Patient Vitals for the past 24 hrs:  BP Temp Temp src Pulse Resp SpO2  10/08/18 0941 (!) 150/86 97.9 F (36.6 C) Oral 82 18 99 %  10/08/18 0411 132/65 98.3 F (36.8 C) Oral 79 17 98 %  10/07/18 2352 120/68 98.3 F (36.8 C) Oral 82 17 97 %  10/07/18 2205 (!) 146/79 98 F (36.7 C) Oral 82 16 95 %   10/07/18 2200 - - - - - 97 %  10/07/18 2155 - - - - - 97 %  10/07/18 2150 - - - - - 95 %  10/07/18 2145 - - - - - 94 %  10/07/18 2140 - - - - - 98 %  10/07/18 2135 - - - - - 98 %  10/07/18 2130 - - - - - 95 %  10/07/18 2125 - - - - - 97 %  10/07/18 2115 - - - - - 100 %  10/07/18 2105 135/87 (!) 97.5 F (36.4 C) Oral 77 18 100 %  10/07/18 2047 - - - 77 (!) 21 98 %  10/07/18 2046 - - - 79 (!) 21 100 %  10/07/18 2045 130/82 - - 81 18 99 %  10/07/18 2044 - - - 81 10 99 %  10/07/18 2043 - - - 84 17 97 %  10/07/18 2042 - - - 81 17 100 %  10/07/18 2041 - - - 86 (!) 21 99 %  10/07/18 2040 - - - 87 (!) 22 98 %  10/07/18 2039 - - - 98 (!) 25 95 %  10/07/18 2038 - - - 81 (!) 23 99 %  10/07/18 2037 - - - 76 17 100 %  10/07/18 2036 - - - 79 (!) 21 95 %  10/07/18 2035 - - -  76 18 99 %  10/07/18 2034 - - - 77 17 95 %  10/07/18 2033 - - - 79 16 92 %  10/07/18 2032 - - - 76 16 97 %  10/07/18 2031 - - - 78 16 96 %  10/07/18 2030 133/83 98.2 F (36.8 C) - 74 15 97 %  10/07/18 2029 - - - 74 15 97 %  10/07/18 2028 - - - 74 14 100 %  10/07/18 2027 - - - 75 (!) 22 99 %  10/07/18 2026 - - - 78 17 96 %  10/07/18 2025 - - - 75 19 99 %  10/07/18 2024 - - - 75 15 98 %  10/07/18 2023 - - - 72 (!) 9 99 %  10/07/18 2022 - - - 73 18 100 %  10/07/18 2021 - - - 76 13 98 %  10/07/18 2020 - - - 72 (!) 6 100 %  10/07/18 2019 - - - 75 18 99 %  10/07/18 2018 - - - 72 14 99 %  10/07/18 2017 - - - 76 18 100 %  10/07/18 2016 - - - 74 12 98 %  10/07/18 2015 140/77 - - 73 19 98 %  10/07/18 2014 - - - 74 16 96 %  10/07/18 2013 - - - 72 17 98 %  10/07/18 2012 - - - 79 17 96 %  10/07/18 2011 - - - 71 14 97 %  10/07/18 2010 - - - 75 14 99 %  10/07/18 2009 - - - 76 16 98 %  10/07/18 2008 - - - 75 19 100 %  10/07/18 2007 - - - 75 18 100 %  10/07/18 2006 - - - 75 17 100 %  10/07/18 2005 - - - 77 (!) 21 99 %  10/07/18 2004 - - - 75 17 98 %  10/07/18 2003 - - - 77 18 98 %  10/07/18 2002 - - - 72 17 98 %   10/07/18 2001 - - - 79 18 99 %  10/07/18 2000 134/77 97.8 F (36.6 C) Oral 73 19 100 %  10/07/18 1959 - - - 74 15 96 %  10/07/18 1958 - - - 80 (!) 21 100 %  10/07/18 1957 - - - 78 19 98 %  10/07/18 1956 - - - 77 19 98 %  10/07/18 1955 - - - 72 15 99 %  10/07/18 1954 - - - 76 18 100 %  10/07/18 1953 - - - 75 20 100 %  10/07/18 1952 - - - 77 18 99 %  10/07/18 1951 - - - 74 14 99 %  10/07/18 1950 - - - 75 17 95 %  10/07/18 1949 - - - 77 18 100 %  10/07/18 1948 - - - 74 17 97 %  10/07/18 1947 - - - 79 19 100 %  10/07/18 1946 - - - 79 18 99 %  10/07/18 1945 131/77 98.2 F (36.8 C) Oral 77 18 100 %  10/07/18 1944 - - - 76 17 100 %  10/07/18 1943 - - - 77 19 100 %  10/07/18 1942 - - - 75 16 99 %  10/07/18 1941 - - - 78 (!) 25 99 %  10/07/18 1940 128/82 - - 78 19 99 %  10/07/18 1939 - - - 77 - 100 %  10/07/18 1625 138/76 97.7 F (36.5 C) Oral 82 18 97 %  10/07/18 1545 - - - -  20 95 %  10/07/18 1400 - - - - 19 97 %  10/07/18 1300 - - - - 20 -  10/07/18 1230 123/68 - - 81 (!) 21 96 %  10/07/18 1100 (!) 118/92 - - 82 20 96 %    Physical exam: General: Well nourished, well developed female in no acute distress. Patient pumping  Medications: Current Facility-Administered Medications  Medication Dose Route Frequency Provider Last Rate Last Dose  . acetaminophen (TYLENOL) tablet 650 mg  650 mg Oral Q4H PRN Truett Mainland, DO   650 mg at 10/07/18 1062  . benzocaine-Menthol (DERMOPLAST) 20-0.5 % topical spray 1 application  1 application Topical PRN Truett Mainland, DO      . bisacodyl (DULCOLAX) suppository 10 mg  10 mg Rectal Daily PRN Truett Mainland, DO      . coconut oil  1 application Topical PRN Truett Mainland, DO      . witch hazel-glycerin (TUCKS) pad 1 application  1 application Topical PRN Truett Mainland, DO       And  . dibucaine (NUPERCAINAL) 1 % rectal ointment 1 application  1 application Rectal PRN Truett Mainland, DO      . diphenhydrAMINE (BENADRYL) capsule  25 mg  25 mg Oral Q6H PRN Truett Mainland, DO      . docusate sodium (COLACE) capsule 100 mg  100 mg Oral BID PRN Truett Mainland, DO      . enalapril (VASOTEC) tablet 10 mg  10 mg Oral Daily Truett Mainland, DO   10 mg at 10/08/18 0948  . ferrous sulfate tablet 325 mg  325 mg Oral Q breakfast Truett Mainland, DO   325 mg at 10/08/18 0949  . fluticasone furoate-vilanterol (BREO ELLIPTA) 200-25 MCG/INH 1 puff  1 puff Inhalation Daily Truett Mainland, DO   1 puff at 10/08/18 0949  . ibuprofen (ADVIL) tablet 600 mg  600 mg Oral Q6H Truett Mainland, DO   600 mg at 10/07/18 2329  . measles, mumps & rubella vaccine (MMR) injection 0.5 mL  0.5 mL Subcutaneous Once Stinson, Jacob J, DO      . NIFEdipine (PROCARDIA XL/NIFEDICAL XL) 24 hr tablet 60 mg  60 mg Oral QHS Truett Mainland, DO   60 mg at 10/07/18 2205  . ondansetron (ZOFRAN) tablet 4 mg  4 mg Oral Q4H PRN Truett Mainland, DO       Or  . ondansetron [facility] 2) injection 4 mg  4 mg Intravenous Q4H PRN Truett Mainland, DO      . oxyCODONE-acetaminophen (PERCOCET/ROXICET) 5-325 MG per tablet 1 tablet  1 tablet Oral Q4H PRN Truett Mainland, DO   1 tablet at 10/08/18 0948  . polyethylene glycol (MIRALAX / GLYCOLAX) packet 17 g  17 g Oral Daily Aletha Halim, MD      . prenatal multivitamin tablet 1 tablet  1 tablet Oral Daily Truett Mainland, DO   1 tablet at 10/08/18 0948  . simethicone (MYLICON) chewable tablet 80 mg  80 mg Oral PRN Truett Mainland, DO      . sodium phosphate (FLEET) 7-19 GM/118ML enema 1 enema  1 enema Rectal Daily PRN Truett Mainland, DO      . Tdap (BOOSTRIX) injection 0.5 mL  0.5 mL Intramuscular Once Stinson, Jacob J, DO      . umeclidinium bromide (INCRUSE ELLIPTA) 62.5 MCG/INH 1 puff  1 puff Inhalation Daily Truett Mainland, DO  1 puff at 10/08/18 0948  . zolpidem (AMBIEN) tablet 5 mg  5 mg Oral QHS PRN Truett Mainland, DO        Labs:  Recent Labs  Lab 10/05/18 1811 10/06/18 0958 10/07/18 0640  WBC  17.9* 20.3* 21.1*  HGB 9.5* 9.6* 10.5*  HCT 30.7* 31.4* 34.8*  PLT 267 274 257    Recent Labs  Lab 10/05/18 1811 10/07/18 0238 10/07/18 0640  NA 136 135 135  K 3.9 4.3 3.8  CL 106 107 105  CO2 22 21* 21*  BUN _0 CREATININE 0.70 0.69 0.69  CALCIUM 8.2* 7.6* 7.6*  PROT 5.6* 5.5* 5.6*  BILITOT 0.5 1.0 0.6  ALKPHOS 59 64 68  ALT _1 AST 11* 30 16  GLUCOSE 101* 84 84    Results for ANTONYA, LEEDER (MRN [id number]) as of 10/08/2018 10:50  Ref. Range 10/07/2018 11:40 10/07/2018 17:52 10/07/2018 19:51 10/07/2018 23:49  Glucose-Capillary Latest Ref Range: 70 - 99 mg/dL 90 78 79 122 (H)    Radiology:  No new imaging  Assessment & Plan:  Pt doing well *PP: routine care. A POS. BTL. Pumping. Girl  *cHTN with superimposed severe pre-eclampsia: doing well on PO meds *GDMA2: doing well on no meds. Can stop CBGs *PPx: SCDs, OOB ad lib *FEN/GI: regular diet, SLIV *Dispo: likely tomorrow.   Durene Romans MD Attending Center for [facility])

## 2018-10-08 NOTE — Anesthesia Postprocedure Evaluation (Signed)
Anesthesia Post Note  Patient: Jenna Stephens  Procedure(s) Performed: POST PARTUM TUBAL LIGATION (N/A )     Patient location during evaluation: PACU Anesthesia Type: Epidural Level of consciousness: awake and alert Pain management: pain level controlled Vital Signs Assessment: post-procedure vital signs reviewed and stable Respiratory status: spontaneous breathing, nonlabored ventilation and respiratory function stable Cardiovascular status: stable Postop Assessment: no headache, no backache and epidural receding Anesthetic complications: no    Last Vitals:  Vitals:   10/07/18 2352 10/08/18 0411  BP: 120/68 132/65  Pulse: 82 79  Resp: 17 17  Temp: 36.8 C 36.8 C  SpO2: 97% 98%    Last Pain:  Vitals:   10/08/18 0411  TempSrc: Oral  PainSc:    Pain Goal: Patients Stated Pain Goal: 2 (10/07/18 0810)                 Haywood Lasso L Emely Fahy

## 2018-10-08 NOTE — Progress Notes (Signed)
Patient in NICU. I will come back later to round. Per RN, patient doing fine  Durene Romans MD Attending Center for [employer] [facility] (Faculty Practice) 10/08/2018 Time: 0900

## 2018-10-08 NOTE — Addendum Note (Signed)
Addendum  created 10/08/18 1213 by Ignacia Bayley, CRNA   Clinical Note Signed
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
## 2018-10-08 NOTE — Anesthesia Postprocedure Evaluation (Signed)
Anesthesia Post Note  Patient: Jenna Stephens  Procedure(s) Performed: AN AD HOC LABOR EPIDURAL     Patient location during evaluation: L&D Anesthesia Type: Epidural Level of consciousness: awake and alert Pain management: pain level controlled Vital Signs Assessment: post-procedure vital signs reviewed and stable Respiratory status: spontaneous breathing, nonlabored ventilation and respiratory function stable Cardiovascular status: blood pressure returned to baseline Postop Assessment: no headache, no backache and epidural receding Anesthetic complications: no    Last Vitals:  Vitals:   10/07/18 2352 10/08/18 0411  BP: 120/68 132/65  Pulse: 82 79  Resp: 17 17  Temp: 36.8 C 36.8 C  SpO2: 97% 98%    Last Pain:  Vitals:   10/08/18 0411  TempSrc: Oral  PainSc:    Pain Goal: Patients Stated Pain Goal: 2 (10/07/18 0810)                 Lidia Collum

## 2018-10-09 ENCOUNTER — Encounter (HOSPITAL_COMMUNITY): Payer: Self-pay | Admitting: *Deleted

## 2018-10-09 ENCOUNTER — Ambulatory Visit: Payer: Self-pay

## 2018-10-09 MED ORDER — NIFEDIPINE ER OSMOTIC RELEASE 30 MG PO TB24: 60.0000 mg | ORAL_TABLET | Freq: Every day | ORAL | 2 refills | Status: DC

## 2018-10-09 MED ORDER — ENALAPRIL MALEATE 10 MG PO TABS: 10.0000 mg | ORAL_TABLET | Freq: Every day | ORAL | 0 refills | Status: DC

## 2018-10-09 MED ORDER — ACETAMINOPHEN 325 MG PO TABS: 650.0000 mg | ORAL_TABLET | ORAL | Status: DC | PRN

## 2018-10-09 MED ORDER — OXYCODONE-ACETAMINOPHEN 5-325 MG PO TABS: 1.0000 | ORAL_TABLET | Freq: Three times a day (TID) | ORAL | 0 refills | Status: DC | PRN

## 2018-10-09 MED ORDER — IBUPROFEN 600 MG PO TABS: 600.0000 mg | ORAL_TABLET | Freq: Four times a day (QID) | ORAL | 0 refills | Status: DC | PRN

## 2018-10-09 MED ORDER — FERROUS SULFATE 325 (65 FE) MG PO TABS: 325.0000 mg | ORAL_TABLET | Freq: Every day | ORAL | 0 refills | Status: DC

## 2018-10-09 MED ORDER — POLYETHYLENE GLYCOL 3350 17 G PO PACK: 17.0000 g | PACK | Freq: Every day | ORAL | 0 refills | Status: AC

## 2018-10-09 NOTE — Progress Notes (Signed)
Pt discharged to home with significant other.  Condition stable.  Pt ambulated to car with Lewanda Rife, NT.  No equipment for home ordered at discharge.

## 2018-10-09 NOTE — Discharge Instructions (Signed)
Hypertension During Pregnancy  Hypertension is also called high blood pressure. High blood pressure means that the force of your blood moving in your body is too strong. When you are pregnant, this condition should be watched carefully. It can cause problems for you and your baby. Follow these instructions at home: Eating and drinking   Drink enough fluid to keep your pee (urine) pale yellow.  Avoid caffeine. Lifestyle  Do not use any products that contain nicotine or tobacco, such as cigarettes and e-cigarettes. If you need help quitting, ask your doctor.  Do not use alcohol or drugs.  Avoid stress.  Rest and get plenty of sleep. General instructions  Take over-the-counter and prescription medicines only as told by your doctor.  While lying down, lie on your left side. This keeps pressure off your major blood vessels.  While sitting or lying down, raise (elevate) your feet. Try putting some pillows under your lower legs.  Exercise regularly. Ask your doctor what kinds of exercise are best for you.  Keep all follow-up visits as told by your doctor. This is important. Contact a doctor if:  You have symptoms that your doctor told you to watch for, such as: ? Throwing up (vomiting). ? Feeling sick to your stomach (nausea). ? Headache. Get help right away if you have:  Very bad belly pain that does not get better with treatment.  A very bad headache that does not get better.  Throwing up that does not get better with treatment.  Sudden, fast weight gain.  Sudden swelling in your hands, ankles, or face.  Fewer movements from your baby than usual.  Blurry vision.  Double vision.  Muscle twitching.  Sudden muscle tightening (spasms).  Trouble breathing.  Blue fingernails or lips. Summary  Hypertension is also called high blood pressure. High blood pressure means that the force of your blood moving in your body is too strong.  Get help right away if you have  symptoms that your doctor told you to watch for. This information is not intended to replace advice given to you by your health care provider. Make sure you discuss any questions you have with your health care provider. Document Released: 05/12/2010 Document Revised: 03/26/2017 Document Reviewed: 12/20/2015 Elsevier Interactive Patient Education  2019 [place].    Vaginal Delivery, Care After Refer to this sheet in the next few weeks. These discharge instructions provide you with information on caring for yourself after delivery. Your caregiver may also give you specific instructions. Your treatment has been planned according to the most current medical practices available, but problems sometimes occur. Call your caregiver if you have any problems or questions after you go home. HOME CARE INSTRUCTIONS 1. Take over-the-counter or prescription medicines only as directed by your caregiver or pharmacist. 2. Do not drink alcohol, especially if you are breastfeeding or taking medicine to relieve pain. 3. Do not smoke tobacco. 4. Continue to use good perineal care. Good perineal care includes: 1. Wiping your perineum from back to front 2. Keeping your perineum clean. 3. You can do sitz baths twice a day, to help keep this area clean 5. Do not use tampons, douche or have sex until your caregiver says it is okay. 6. Shower only and avoid sitting in submerged water, aside from sitz baths 7. Wear a well-fitting bra that provides breast support. 8. Eat healthy foods. 9. Drink enough fluids to keep your urine clear or pale yellow. 10. Eat high-fiber foods such as whole grain cereals  and breads, brown rice, beans, and fresh fruits and vegetables every day. These foods may help prevent or relieve constipation. 11. Avoid constipation with high fiber foods or medications, such as miralax or metamucil 12. Follow your caregiver's recommendations regarding resumption of activities such as climbing stairs,  driving, lifting, exercising, or traveling. 29. Talk to your caregiver about resuming sexual activities. Resumption of sexual activities is dependent upon your risk of infection, your rate of healing, and your comfort and desire to resume sexual activity. 14. Try to have someone help you with your household activities and your newborn for at least a few days after you leave the hospital. 15. Rest as much as possible. Try to rest or take a nap when your newborn is sleeping. 16. Increase your activities gradually. 32. Keep all of your scheduled postpartum appointments. It is very important to keep your scheduled follow-up appointments. At these appointments, your caregiver will be checking to make sure that you are healing physically and emotionally. SEEK MEDICAL CARE IF:   You are passing large clots from your vagina. Save any clots to show your caregiver.  You have a foul smelling discharge from your vagina.  You have trouble urinating.  You are urinating frequently.  You have pain when you urinate.  You have a change in your bowel movements.  You have increasing redness, pain, or swelling near your vaginal incision (episiotomy) or vaginal tear.  You have pus draining from your episiotomy or vaginal tear.  Your episiotomy or vaginal tear is separating.  You have painful, hard, or reddened breasts.  You have a severe headache.  You have blurred vision or see spots.  You feel sad or depressed.  You have thoughts of hurting yourself or your newborn.  You have questions about your care, the care of your newborn, or medicines.  You are dizzy or light-headed.  You have a rash.  You have nausea or vomiting.  You were breastfeeding and have not had a menstrual period within 12 weeks after you stopped breastfeeding.  You are not breastfeeding and have not had a menstrual period by the 12th week after delivery.  You have a fever. SEEK IMMEDIATE MEDICAL CARE IF:   You have  persistent pain.  You have chest pain.  You have shortness of breath.  You faint.  You have leg pain.  You have stomach pain.  Your vaginal bleeding saturates two or more sanitary pads in 1 hour. MAKE SURE YOU:   Understand these instructions.  Will watch your condition.  Will get help right away if you are not doing well or get worse. Document Released: 04/06/2000 Document Revised: 08/24/2013 Document Reviewed: 12/05/2011 [facility] Patient Information 2015 [place], [place]. This information is not intended to replace advice given to you by your health care provider. Make sure you discuss any questions you have with your health care provider.  Sitz Bath A sitz bath is a warm water bath taken in the sitting position. The water covers only the hips and butt (buttocks). We recommend using one that fits in the toilet, to help with ease of use and cleanliness. It may be used for either healing or cleaning purposes. Sitz baths are also used to relieve pain, itching, or muscle tightening (spasms). The water may contain medicine. Moist heat will help you heal and relax.  HOME CARE  Take 3 to 4 sitz baths a day. 18. Fill the bathtub half-full with warm water. 19. Sit in the water and open the drain  a little. 20. Turn on the warm water to keep the tub half-full. Keep the water running constantly. 21. Soak in the water for 15 to 20 minutes. 22. After the sitz bath, pat the affected area dry. GET HELP RIGHT AWAY IF: You get worse instead of better. Stop the sitz baths if you get worse. MAKE SURE YOU:  Understand these instructions.  Will watch your condition.  Will get help right away if you are not doing well or get worse. Document Released: 05/17/2004 Document Revised: 01/02/2012 Document Reviewed: 08/07/2010 [facility] Patient Information 2015 [place], [place]. This information is not intended to replace advice given to you by your health care provider. Make sure you discuss any questions  you have with your health care provider.

## 2018-10-09 NOTE — Lactation Note (Signed)
This note was copied from a baby's chart. Lactation Consultation Note  Patient Name: Jenna Stephens [id number] Date: 10/09/2018   [name] was discharged prior to my visit. I spoke with the RN in NICU, and she indicated that [name] would be in around 5 pm to see baby.  Follow up needed to determine if [name] has/needs a breast pump.      Lenore Manner 10/09/2018, 9:15 PM

## 2018-10-10 ENCOUNTER — Telehealth: Payer: Self-pay | Admitting: Family Medicine

## 2018-10-10 LAB — TYPE AND SCREEN
ABO/RH(D): A POS
Antibody Screen: POSITIVE
Donor AG Type: NEGATIVE
Donor AG Type: NEGATIVE
Unit division: 0
Unit division: 0

## 2018-10-10 LAB — BPAM RBC
Blood Product Expiration Date: 202006292359
Blood Product Expiration Date: 202006292359
Unit Type and Rh: 6200
Unit Type and Rh: 6200

## 2018-10-10 NOTE — Telephone Encounter (Signed)
Attempted to call patient to inform her that her FMLA paperwork has been completed and ready for pick-up. No answer, left voicemail with the above information. Also instructed that the $!% payment will be due when she picks the papers up. Advised to give the office a call with any questions.

## 2018-10-11 ENCOUNTER — Encounter (HOSPITAL_COMMUNITY): Payer: Self-pay | Admitting: Family Medicine

## 2018-10-14 ENCOUNTER — Other Ambulatory Visit: Payer: Self-pay

## 2018-10-14 ENCOUNTER — Other Ambulatory Visit: Payer: Self-pay | Admitting: Medical

## 2018-10-14 ENCOUNTER — Telehealth: Payer: Self-pay | Admitting: Family Medicine

## 2018-10-14 ENCOUNTER — Telehealth (INDEPENDENT_AMBULATORY_CARE_PROVIDER_SITE_OTHER): Payer: Medicaid Other | Admitting: General Practice

## 2018-10-14 VITALS — BP 169/114

## 2018-10-14 DIAGNOSIS — O119 Pre-existing hypertension with pre-eclampsia, unspecified trimester: Secondary | ICD-10-CM

## 2018-10-14 DIAGNOSIS — F329 Major depressive disorder, single episode, unspecified: Secondary | ICD-10-CM

## 2018-10-14 DIAGNOSIS — F419 Anxiety disorder, unspecified: Secondary | ICD-10-CM

## 2018-10-14 MED ORDER — ENALAPRIL MALEATE 20 MG PO TABS: 20.0000 mg | ORAL_TABLET | Freq: Every day | ORAL | 0 refills | Status: DC

## 2018-10-14 MED ORDER — ALPRAZOLAM 0.25 MG PO TABS: 0.2500 mg | ORAL_TABLET | Freq: Three times a day (TID) | ORAL | 0 refills | Status: DC | PRN

## 2018-10-14 NOTE — Progress Notes (Signed)
Called patient for BP check appt. Per chart review, patient delivered 6/16 & had severe Pre-E. Baby died on 10-30-22. Patient states she is taking her medication daily, procardia two tablets at bedtime but not the vasotec. Denies headaches, dizziness or blurry vision. Patient is teary on the phone and requests medication for anxiety. Patient states she is having a hard time with everything that is going on. Reports she has taken zoloft in the past, but feels she needs something stronger than that. Patient's BP is quite high today 170/109 and 169/114- she states she has been checking them at home and they have been that high as well.  Reviewed with Kerry Hough, who states patient should stop procardia and take vasotec 20mg  (new Rx ordered) and Rx for Xanax will be prescribed for patient as well. Patient should follow up with [name]. Patient will also need BP check appt on Friday per Kerry Hough. Informed patient of Xanax Rx and discussed appt with [name]- patient agreeable. Also discussed stopping procardia and picking up new prescription for vasotec 20mg . Told patient if she experiences severe headache, dizziness, blurry vision or chest pain she needs to go to the ER immediately. Patient verbalized understanding to all & is aware of nurse visit appt for BP check by telephone on Friday. Patient had no questions.  Koren Bound RN BSN 10/14/18

## 2018-10-14 NOTE — Telephone Encounter (Signed)
Spoke with patient about an appointment with Jenna Stephens, and a nurse telephone visit on Friday. Patient sounded sad, but was willing to speak to Jenna Stephens.

## 2018-10-14 NOTE — Progress Notes (Signed)
I have reviewed the chart and agree with nursing staff's documentation of this patient's encounter.  Discussed patient with Dr. Nehemiah Settle. Originally patient stated she was taking Vasotec and not Procardia, but on re-check she verified she is taking Procardia only. Vasotec has been more widely used for PP HTN recently, so will start patient on that and have BP recheck on Friday. Warning symptoms for worsening condition reviewed by the RN. Rx Xanax sent to patient's pharmacy (#10) and advised that she should see [name] prior to receiving additional Rx.   Kerry Hough, PA-C 10/14/2018 11:43 AM

## 2018-10-15 ENCOUNTER — Ambulatory Visit (HOSPITAL_COMMUNITY): Payer: Medicaid Other

## 2018-10-15 ENCOUNTER — Telehealth: Payer: Medicaid Other | Admitting: Obstetrics and Gynecology

## 2018-10-15 ENCOUNTER — Encounter (HOSPITAL_COMMUNITY): Payer: Self-pay

## 2018-10-16 ENCOUNTER — Other Ambulatory Visit: Payer: Self-pay

## 2018-10-16 ENCOUNTER — Ambulatory Visit: Payer: Medicaid Other | Admitting: Clinical

## 2018-10-16 DIAGNOSIS — F419 Anxiety disorder, unspecified: Secondary | ICD-10-CM

## 2018-10-16 NOTE — BH Specialist Note (Signed)
Pt did not arrive to [facility] video visit, and did not answer the phone. Left MyChart visit for patient; unable to leave phone message as voicemail is full.   Integrated Behavioral Health Visit via Telemedicine (Telephone)  10/16/2018 TYLIN STRADLEY [phone]   Garlan Fair

## 2018-10-17 ENCOUNTER — Other Ambulatory Visit: Payer: Self-pay

## 2018-10-17 ENCOUNTER — Other Ambulatory Visit: Payer: Self-pay | Admitting: Family Medicine

## 2018-10-17 ENCOUNTER — Ambulatory Visit: Payer: Medicaid Other

## 2018-10-17 DIAGNOSIS — Z0130 Encounter for examination of blood pressure without abnormal findings: Secondary | ICD-10-CM

## 2018-10-17 MED ORDER — HYDROCHLOROTHIAZIDE 25 MG PO TABS: 25.0000 mg | ORAL_TABLET | Freq: Every day | ORAL | 0 refills | Status: DC

## 2018-10-17 NOTE — Progress Notes (Signed)
I connected with  Jenna Stephens on 10/17/18 at 10:00 AM EDT by telephone and verified that I am speaking with the correct person using two identifiers.   I discussed the limitations, risks, security and privacy concerns of performing an evaluation and management service by telephone and the availability of in person appointments. I also discussed with the patient that there may be a patient responsible charge related to this service. The patient expressed understanding and agreed to proceed.  Pt called today for BP check.  Pt reports BP level as 176/116.  Pt denies any sx's.  Notified Dr. Kennon Rounds who recommends adding HCTZ 25 mg po tablet once daily along with current BP medication of Vasotec 20 mg po tablet daily and we will do a rpt BP check on 10/23/18 @ 1000.  Notified pt provider's recommendation and to continue to monitor for sx's of HTN.  Pt verbalized understanding with no further questions.   Verdell Carmine, RN 10/17/2018  12:56 PM

## 2018-10-22 ENCOUNTER — Other Ambulatory Visit: Payer: Self-pay | Admitting: Medical

## 2018-10-22 ENCOUNTER — Other Ambulatory Visit: Payer: Self-pay

## 2018-10-22 ENCOUNTER — Ambulatory Visit: Payer: Medicaid Other | Admitting: Clinical

## 2018-10-22 ENCOUNTER — Telehealth: Payer: Self-pay | Admitting: Family Medicine

## 2018-10-22 DIAGNOSIS — F4321 Adjustment disorder with depressed mood: Secondary | ICD-10-CM

## 2018-10-22 DIAGNOSIS — Z634 Disappearance and death of family member: Secondary | ICD-10-CM

## 2018-10-22 DIAGNOSIS — F419 Anxiety disorder, unspecified: Secondary | ICD-10-CM

## 2018-10-22 DIAGNOSIS — F329 Major depressive disorder, single episode, unspecified: Secondary | ICD-10-CM

## 2018-10-22 MED ORDER — ALPRAZOLAM 0.25 MG PO TABS: 0.2500 mg | ORAL_TABLET | Freq: Three times a day (TID) | ORAL | 0 refills | Status: DC | PRN

## 2018-10-22 NOTE — Telephone Encounter (Signed)
The patient stated she would like a refill on xanax. She visits the [facility] on [address].

## 2018-10-22 NOTE — BH Specialist Note (Addendum)
Integrated Behavioral Health via Telemedicine Video Visit  10/22/2018 Jenna Stephens [id number]  Number of [place] visits: 1 Session Start time: 1:45  Session End time: 2:20 Total time: 35 minutes  Referring Provider: Kerry Hough, DO for anxiety  Type of Visit: Video Patient/Family location: Home [facility] Provider location: [facility] All persons participating in visit: Patient Jenna Stephens and [place]  Confirmed patient's address: Yes  Confirmed patient's phone number: Yes  Any changes to demographics: No   Confirmed patient's insurance: Yes  Any changes to patient's insurance: No   Discussed confidentiality: Yes   I connected with Lateya S Morehead  by a video enabled telemedicine application and verified that I am speaking with the correct person using two identifiers.     I discussed the limitations of evaluation and management by telemedicine and the availability of in person appointments.  I discussed that the purpose of this visit is to provide behavioral health care while limiting exposure to the novel coronavirus.   Discussed there is a possibility of technology failure and discussed alternative modes of communication if that failure occurs.  I discussed that engaging in this video visit, they consent to the provision of behavioral healthcare and the services will be billed under their insurance.  Patient and/or legal guardian expressed understanding and consented to video visit: Yes   PRESENTING CONCERNS: Patient and/or family reports the following symptoms/concerns: Pt states her primary concern today is lack of quality sleep(up to 4 hours at a time nightly)  and mild headache from crying after loss of baby in NICU.  Duration of problem: Over 2 weeks; Severity of problem: moderate  STRENGTHS (Protective Factors/Coping Skills): Strong social support   GOALS ADDRESSED: Patient will: 1.  Reduce symptoms of: insomnia  2.  Increase knowledge  and/or ability of: healthy habits  3.  Demonstrate ability to: Begin healthy grieving over loss  INTERVENTIONS: Interventions utilized:  Mindfulness or Relaxation Training Standardized Assessments completed: Not Needed  ASSESSMENT: Patient currently experiencing Grief.   Patient may benefit from psychoeducation and brief therapeutic interventions regarding coping with symptoms of anxiety associated with grief.  Marland Kitchen  PLAN: 1. Follow up with behavioral health clinician on : As needed 2. Behavioral recommendations:  -Attend initial appointment with [facility] tomorrow, 10/23/2018 at 9:30am -Continue allowing feelings of grief -CALM relaxation breathing exercise twice daily (morning; at bedtime with sleep sounds) -Continue taking all medications prescribed by medical provider 3. Referral(s): [facility] (In Clinic)  I discussed the assessment and treatment plan with the patient and/or parent/guardian. They were provided an opportunity to ask questions and all were answered. They agreed with the plan and demonstrated an understanding of the instructions.   They were advised to call back or seek an in-person evaluation if the symptoms worsen or if the condition fails to improve as anticipated.  Caroleen Hamman Takiesha Mcdevitt

## 2018-11-04 ENCOUNTER — Telehealth (INDEPENDENT_AMBULATORY_CARE_PROVIDER_SITE_OTHER): Payer: Medicaid Other

## 2018-11-04 DIAGNOSIS — F53 Postpartum depression: Secondary | ICD-10-CM

## 2018-11-04 NOTE — Progress Notes (Signed)
TELEHEALTH VIRTUAL POSTPARTUM VISIT ENCOUNTER NOTE  I connected with Jenna Stephens on 11/04/18 at  8:15 AM EDT by MyChart Virtual Visit at home and verified that I am speaking with the correct person using two identifiers.   I discussed the limitations, risks, security and privacy concerns of performing an evaluation and management service by telephone and the availability of in person appointments. I also discussed with the patient that there may be a patient responsible charge related to this service. The patient expressed understanding and agreed to proceed.  Appointment Date: 11/04/2018  OBGYN Clinic: [facility]  Chief Complaint:  Postpartum Visit  History of Present Illness: Jenna Stephens is a 43 y.o. African-American [id number] (No LMP recorded.), seen for the above chief complaint. Her past medical history is significant for chronic hypertension   She is s/p normal spontaneous vaginal delivery on 6/16 at 32 weeks; she was discharged to home on PPD#2. Pregnancy complicated by chronic hypertension with superimposed preeclampsia, gestational diabetes on insulin, advanced maternal age. Baby passed away in NICU.  Complains of depression  Vaginal bleeding or discharge: No  Mode of feeding infant: n/a Intercourse: No  Contraception: bilateral tubal ligation PP depression s/s: Yes .  Any bowel or bladder issues: No  Pap smear: patient unsure of last pap  Review of Systems: Positive for depression. Her 12 point review of systems is negative or as noted in the History of Present Illness.  Patient Active Problem List   Diagnosis Date Noted  . 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 Positive GBS test 10/05/2018  . Headache in pregnancy, antepartum, third trimester 09/29/2018  . Gestational diabetes mellitus (GDM) affecting pregnancy, antepartum 09/05/2018  . Supervision of high risk pregnancy, antepartum 07/07/2018  . AMA (advanced maternal age) multigravida 21+,  unspecified trimester 07/07/2018  . GERD (gastroesophageal reflux disease) 06/09/2018  . Severe persistent asthma without complication 71/69/6789  . HTN (hypertension) 12/29/2017  . Obesity 12/29/2017    Medications Larin S. Morehead had no medications administered during this visit. Current Outpatient Medications  Medication Sig Dispense Refill  . acetaminophen (TYLENOL) 325 MG tablet Take 2 tablets (650 mg total) by mouth every 4 (four) hours as needed (for pain scale < 4).    . albuterol (PROVENTIL HFA;VENTOLIN HFA) 108 (90 Base) MCG/ACT inhaler Inhale 2 puffs into the lungs every 6 (six) hours as needed for wheezing or shortness of breath. 1 Inhaler 2  . ALPRAZolam (XANAX) 0.25 MG tablet Take 1 tablet (0.25 mg total) by mouth 3 (three) times daily as needed for anxiety. 20 tablet 0  . enalapril (VASOTEC) 20 MG tablet TAKE 1 TABLET(20 MG) BY MOUTH DAILY 90 tablet 1  . ferrous sulfate 325 (65 FE) MG tablet Take 1 tablet (325 mg total) by mouth daily with breakfast for 30 days. 30 tablet 0  . fluticasone furoate-vilanterol (BREO ELLIPTA) 200-25 MCG/INH AEPB Inhale 1 puff into the lungs daily. 28 each 11  . hydrochlorothiazide (HYDRODIURIL) 25 MG tablet TAKE 1 TABLET(25 MG) BY MOUTH DAILY 90 tablet 3  . ibuprofen (ADVIL) 600 MG tablet Take 1 tablet (600 mg total) by mouth every 6 (six) hours as needed. 30 tablet 0  . lansoprazole (PREVACID) 30 MG capsule Take 1 capsule (30 mg total) by mouth daily at 12 noon. 30 capsule 5  . montelukast (SINGULAIR) 10 MG tablet Take 1 tablet (10 mg total) by mouth daily. 30 tablet 2  . oxyCODONE-acetaminophen (PERCOCET/ROXICET) 5-325 MG tablet Take 1 tablet by mouth every  8 (eight) hours as needed for moderate pain. (Patient not taking: Reported on 11/04/2018) 4 tablet 0  . polyethylene glycol (MIRALAX / GLYCOLAX) 17 g packet Take 17 g by mouth daily for 30 days. (Patient not taking: Reported on 11/04/2018) 30 packet 0  . Prenatal Vit-Fe Fumarate-FA (PRENATAL  MULTIVITAMIN) TABS tablet Take 1 tablet by mouth daily at 12 noon.    . promethazine (PHENERGAN) 25 MG tablet Take 1 tablet by mouth 2 (two) times daily.    Marland Kitchen umeclidinium bromide (INCRUSE ELLIPTA) 62.5 MCG/INH AEPB Inhale 1 puff into the lungs daily. (Patient not taking: Reported on 11/04/2018) 30 each 11   No current facility-administered medications for this visit.     Allergies Patient has no known allergies.  Physical Exam:  General:  Alert, oriented and cooperative.   Mental Status: Normal mood and affect perceived. Normal judgment and thought content.  Rest of physical exam deferred due to type of encounter  PP Depression Screening:   Edinburgh Postnatal Depression Scale - 11/04/18 0827      Edinburgh Postnatal Depression Scale:  In the Past 7 Days   I have been able to laugh and see the funny side of things.  2    I have looked forward with enjoyment to things.  2    I have blamed myself unnecessarily when things went wrong.  1    I have been anxious or worried for no good reason.  1    I have felt scared or panicky for no good reason.  1    Things have been getting on top of me.  3    I have been so unhappy that I have had difficulty sleeping.  3    I have felt sad or miserable.  3    I have been so unhappy that I have been crying.  3    The thought of harming myself has occurred to me.  0    Edinburgh Postnatal Depression Scale Total  19       Assessment:Patient is a 43 y.o. [id number] who is 4 weeks postpartum from a normal spontaneous vaginal delivery.  She is doing as expected after infant loss.   Plan:  1. Postpartum depression -BP 177/111 and 165/110. Patient states BPs have been in this range for the last 3 weeks. Reports taking both medications this am. Patient crying during visit and CNM unable to tell if BP if related to mood or need for better control -Consulted with Dr. Ilda Basset- recommends in person visit tomorrow or Thursday with MD to adjust medication   -Patient understandably upset and having feelings of depression since loss of infant in NICU. Denies any SI/HI. Last saw [name] on 7/1 and desires an appointment with her again. Will attempt to have in person appointment during visit with MD and virtual if unable. Message sent.  - Ambulatory referral to [facility]  2. Postpartum care and examination   RTC in 1-2 days in person with MD to adjust HTN medications  I discussed the assessment and treatment plan with the patient. The patient was provided an opportunity to ask questions and all were answered. The patient agreed with the plan and demonstrated an understanding of the instructions.   The patient was advised to call back or seek an in-person evaluation/go to the ED for any concerning postpartum symptoms.  I provided 20 minutes of non-face-to-face time during this encounter.   Wende Mott, [place] for [employer], [facility]

## 2018-11-05 ENCOUNTER — Telehealth: Payer: Self-pay | Admitting: Family Medicine

## 2018-11-05 NOTE — Telephone Encounter (Signed)
Patient called in stating that she wants to know the status or her paperwork. Patient stated that she has to have them turn back into her job by 7/16. Patient instructed that I would let the lady know who is completing the paperwork. Patient verbalized understanding.

## 2018-11-06 ENCOUNTER — Other Ambulatory Visit: Payer: Self-pay

## 2018-11-06 ENCOUNTER — Encounter: Payer: Self-pay | Admitting: Obstetrics & Gynecology

## 2018-11-06 ENCOUNTER — Ambulatory Visit (INDEPENDENT_AMBULATORY_CARE_PROVIDER_SITE_OTHER): Payer: Medicaid Other | Admitting: Obstetrics & Gynecology

## 2018-11-06 VITALS — BP 151/103 | HR 93 | Temp 97.9°F | Wt 270.4 lb

## 2018-11-06 DIAGNOSIS — Z8632 Personal history of gestational diabetes: Secondary | ICD-10-CM

## 2018-11-06 DIAGNOSIS — F4321 Adjustment disorder with depressed mood: Secondary | ICD-10-CM | POA: Insufficient documentation

## 2018-11-06 DIAGNOSIS — O119 Pre-existing hypertension with pre-eclampsia, unspecified trimester: Secondary | ICD-10-CM

## 2018-11-06 DIAGNOSIS — F5102 Adjustment insomnia: Secondary | ICD-10-CM

## 2018-11-06 DIAGNOSIS — Z634 Disappearance and death of family member: Secondary | ICD-10-CM

## 2018-11-06 DIAGNOSIS — F329 Major depressive disorder, single episode, unspecified: Secondary | ICD-10-CM

## 2018-11-06 DIAGNOSIS — F32A Depression, unspecified: Secondary | ICD-10-CM

## 2018-11-06 MED ORDER — ALPRAZOLAM 0.5 MG PO TABS: 0.5000 mg | ORAL_TABLET | Freq: Three times a day (TID) | ORAL | 2 refills | Status: DC | PRN

## 2018-11-06 MED ORDER — HYDROCHLOROTHIAZIDE 25 MG PO TABS: 25.0000 mg | ORAL_TABLET | Freq: Two times a day (BID) | ORAL | 3 refills | Status: DC

## 2018-11-06 MED ORDER — ENALAPRIL MALEATE 20 MG PO TABS: 20.0000 mg | ORAL_TABLET | Freq: Two times a day (BID) | ORAL | 2 refills | Status: DC

## 2018-11-06 NOTE — Progress Notes (Signed)
GYNECOLOGY OFFICE VISIT NOTE  History:   Jenna Stephens is a 43 y.o. [id number] here today for follow up BP and mood changes related to recent perinatal loss.  She delivered after IOL at [redacted]w[redacted]d for [facility] with superimposed severe preeclampsia. Baby was fine initially but passed away due to infection on third day of life. She has been grieving appropriately, but had anxiety and insomnia treated with Xanax. Desires a refill. She denies any abnormal vaginal discharge, bleeding, pelvic pain or other concerns.    Past Medical History:  Diagnosis Date  . Acute respiratory failure ([place]) 12/29/2017  . Anxiety   . Asthma   . Gestational diabetes   . Hypertension   . Iron deficiency anemia   . Obesity     Past Surgical History:  Procedure Laterality Date  . TONSILLECTOMY    . TUBAL LIGATION N/A 10/07/2018   Procedure: POST PARTUM TUBAL LIGATION;  Surgeon: Truett Mainland, DO;  Location: [facility] LD ORS;  Service: Gynecology;  Laterality: N/A;    The following portions of the patient's history were reviewed and updated as appropriate: allergies, current medications, past family history, past medical history, past social history, past surgical history and problem list.   Review of Systems:  Pertinent items noted in HPI and remainder of comprehensive ROS otherwise negative.  Physical Exam:  BP (!) 151/103   Pulse 93   Temp 97.9 F (36.6 C)   Wt 270 lb 6.4 oz (122.7 kg)   BMI 42.35 kg/m  CONSTITUTIONAL: Well-developed, well-nourished female in no acute distress.  HEENT:  Normocephalic, atraumatic. External right and left ear normal. No scleral icterus.  NECK: Normal range of motion, supple, no masses noted on observation SKIN: No rash noted. Not diaphoretic. No erythema. No pallor. MUSCULOSKELETAL: Normal range of motion. No edema noted. NEUROLOGIC: Alert and oriented to person, place, and time. Normal muscle tone coordination. No cranial nerve deficit noted. PSYCHIATRIC: Normal mood and affect.  Normal behavior. Normal judgment and thought content. CARDIOVASCULAR: Normal heart rate noted RESPIRATORY: Effort and breath sounds normal, no problems with respiration noted ABDOMEN: No masses noted. No other overt distention noted.   PELVIC: Deferred   Edinburgh Postnatal Depression Scale Screening Tool 11/04/2018 10/08/2018  I have been able to laugh and see the funny side of things. 2 0  I have looked forward with enjoyment to things. 2 0  I have blamed myself unnecessarily when things went wrong. 1 1  I have been anxious or worried for no good reason. 1 2  I have felt scared or panicky for no good reason. 1 1  Things have been getting on top of me. 3 1  I have been so unhappy that I have had difficulty sleeping. 3 0  I have felt sad or miserable. 3 1  I have been so unhappy that I have been crying. 3 1  The thought of harming myself has occurred to me. 0 0  Edinburgh Postnatal Depression Scale Total 19 7        Assessment and Plan:    1. Chronic hypertension with superimposed severe preeclampsia Medication changed to  Enalapril 20 mg bid and HCTZ 25 mg bid. Will reevaluate in one week. - enalapril (VASOTEC) 20 MG tablet; Take 1 tablet (20 mg total) by mouth 2 (two) times daily.  Dispense: 60 tablet; Refill: 2 - hydrochlorothiazide (HYDRODIURIL) 25 MG tablet; Take 1 tablet (25 mg total) by mouth 2 (two) times daily.  Dispense: 60 tablet; Refill: 3  2. Anxiety and depression 3. Adjustment insomnia 4. Grief at loss of child Appropriate support given to her, encouraged her to consider [employer] online support. She is already followed by our [facility] clinician. Xanax refilled for anxiety and insomnia. - ALPRAZolam (XANAX) 0.5 MG tablet; Take 1 tablet (0.5 mg total) by mouth 3 (three) times daily as needed for anxiety or sleep.  Dispense: 90 tablet; Refill: 2  5. History of gestational diabetes Needs 2 hr GTT 6-12 weeks postpartum - Glucose tolerance, 2 hours; Future  Return in about  1 week (around 11/13/2018) for Virtual Follow Up HTN and depression (fetal loss).    Total face-to-face time with patient: 20 minutes.  Over 50% of encounter was spent on counseling and coordination of care.   Verita Schneiders, MD, [place] for [employer], [place]

## 2018-11-06 NOTE — Patient Instructions (Addendum)
Return to clinic for any scheduled appointments or for any gynecologic concerns as needed.    Please consider [employer] online support group

## 2018-11-13 ENCOUNTER — Encounter: Payer: Self-pay | Admitting: Obstetrics and Gynecology

## 2018-11-13 ENCOUNTER — Other Ambulatory Visit: Payer: Self-pay

## 2018-11-13 ENCOUNTER — Telehealth: Payer: Medicaid Other | Admitting: Obstetrics and Gynecology

## 2018-11-13 ENCOUNTER — Telehealth: Payer: Self-pay | Admitting: Obstetrics and Gynecology

## 2018-11-13 NOTE — Progress Notes (Signed)
@  356 no answer lvm that I will call back. @407pm  no answer

## 2018-11-13 NOTE — Telephone Encounter (Signed)
Attempted to call patient to get her rescheduled for her missed appointment on 7/23. No answer, left voicemail for patient to give the office a call back to be rescheduled. No shoe letter mailed.

## 2018-11-14 ENCOUNTER — Telehealth: Payer: Medicaid Other | Admitting: Medical

## 2018-11-17 ENCOUNTER — Encounter: Payer: Self-pay | Admitting: Family Medicine

## 2018-11-26 ENCOUNTER — Other Ambulatory Visit: Payer: Self-pay

## 2018-11-26 ENCOUNTER — Telehealth (INDEPENDENT_AMBULATORY_CARE_PROVIDER_SITE_OTHER): Payer: Medicaid Other | Admitting: Obstetrics & Gynecology

## 2018-11-26 DIAGNOSIS — O99345 Other mental disorders complicating the puerperium: Secondary | ICD-10-CM

## 2018-11-26 DIAGNOSIS — Z0000 Encounter for general adult medical examination without abnormal findings: Secondary | ICD-10-CM

## 2018-11-26 DIAGNOSIS — F53 Postpartum depression: Secondary | ICD-10-CM | POA: Diagnosis not present

## 2018-11-26 MED ORDER — LABETALOL HCL 200 MG PO TABS: 400.0000 mg | ORAL_TABLET | Freq: Two times a day (BID) | ORAL | 3 refills | Status: DC

## 2018-11-26 NOTE — Progress Notes (Signed)
    TELEHEALTH GYNECOLOGY VIRTUAL VIDEO VISIT ENCOUNTER NOTE  Provider location: Center for [employer] at [facility]   I connected with Jenna Stephens on 11/26/18 at 11:15 AM EDT by [facility] Video Encounter at home and verified that I am speaking with the correct person using two identifiers.   I discussed the limitations, risks, security and privacy concerns of performing an evaluation and management service virtually and the availability of in person appointments. I also discussed with the patient that there may be a patient responsible charge related to this service. The patient expressed understanding and agreed to proceed.   History:  Jenna Stephens is a 43 y.o. [phone] female being evaluated today for BP issues and pp depression. She denies any abnormal vaginal discharge, bleeding, pelvic pain or other concerns.       Past Medical History:  Diagnosis Date  . Acute respiratory failure ([place]) 12/29/2017  . Anxiety   . Asthma   . Gestational diabetes   . Hypertension   . Iron deficiency anemia   . Obesity    Past Surgical History:  Procedure Laterality Date  . TONSILLECTOMY    . TUBAL LIGATION N/A 10/07/2018   Procedure: POST PARTUM TUBAL LIGATION;  Surgeon: Truett Mainland, DO;  Location: [facility] LD ORS;  Service: Gynecology;  Laterality: N/A;   The following portions of the patient's history were reviewed and updated as appropriate: allergies, current medications, past family history, past medical history, past social history, past surgical history and problem list.      Review of Systems:  Pertinent items noted in HPI and remainder of comprehensive ROS otherwise negative.  Physical Exam:   General:  Alert, oriented and cooperative. Patient appears to be in no acute distress.  Mental Status: Normal mood and affect. Normal behavior. Normal judgment and thought content.   Respiratory: Normal respiratory effort, no problems with respiration noted  Rest of physical  exam deferred due to type of encounter BP per patient was 177/108 (she has taken her meds today) Labs and Imaging No results found for this or any previous visit (from the past 336 hour(s)). No results found.     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 They were prescribed by a Dr. Oren Section (at [facility]).  - She will get a referral to a psychiatrist  She feels unable to work right now (employed at [employer]) - she can have a note for out of work for another 4 weeks      I discussed the assessment and treatment plan with the patient. The patient was provided an opportunity to ask questions and all were answered. The patient agreed with the plan and demonstrated an understanding of the instructions.   The patient was advised to call back or seek an in-person evaluation/go to the ED if the symptoms worsen or if the condition fails to improve as anticipated.  I provided 10 minutes of face-to-face time during this encounter.   Emily Filbert, MD Center for [employer], [place]

## 2018-12-10 NOTE — Addendum Note (Signed)
Addended by: Vesta Mixer C on: 12/10/2018 05:02 PM   Modules accepted: Orders

## 2018-12-16 NOTE — BH Specialist Note (Signed)
Integrated Behavioral Health via Telemedicine Video Visit  12/16/2018 MARRAH CESARO [id number]  Number of Integrated Behavioral Health visits: 4 Session Start time: 10:17  Session End time: 10:49 Total time: 30 minutes  Referring Provider: Kerry Hough, DO Type of Visit: Video Patient/Family location: Home [facility] Provider location: [facility] All persons participating in visit: Patient Jenna Stephens and [place]  Confirmed patient's address: Yes  Confirmed patient's phone number: Yes  Any changes to demographics: No   Confirmed patient's insurance: Yes  Any changes to patient's insurance: No   Discussed confidentiality: Yes   I connected with Ashritha S Morehead by a video enabled telemedicine application and verified that I am speaking with the correct person using two identifiers.     I discussed the limitations of evaluation and management by telemedicine and the availability of in person appointments.  I discussed that the purpose of this visit is to provide behavioral health care while limiting exposure to the novel coronavirus.   Discussed there is a possibility of technology failure and discussed alternative modes of communication if that failure occurs.  I discussed that engaging in this video visit, they consent to the provision of behavioral healthcare and the services will be billed under their insurance.  Patient and/or legal guardian expressed understanding and consented to video visit: Yes   PRESENTING CONCERNS: Patient and/or family reports the following symptoms/concerns: Pt states her primary concern today is continued grief, every time she hears a baby cry, or is reminded of her baby.  Duration of problem: Over 2  months; Severity of problem: moderately severe  STRENGTHS (Protective Factors/Coping Skills): Supportive family  GOALS ADDRESSED: Patient will: 1.  Demonstrate ability to: Mallory Shirk the loss of baby  INTERVENTIONS: Interventions utilized:   Supportive Counseling Standardized Assessments completed: Not Needed  ASSESSMENT: Patient currently experiencing Grief  Patient may benefit from continued supportive counseling.  PLAN: 1. Follow up with behavioral health clinician on : As needed 2. Behavioral recommendations:  -Accept referral to psychiatry -Consider starting gratitude journal, prior to starting back to work in two weeks, as discussed 3. Referral(s): [facility] (In Clinic)  I discussed the assessment and treatment plan with the patient and/or parent/guardian. They were provided an opportunity to ask questions and all were answered. They agreed with the plan and demonstrated an understanding of the instructions.   They were advised to call back or seek an in-person evaluation if the symptoms worsen or if the condition fails to improve as anticipated.  Caroleen Hamman Tinya Cadogan

## 2018-12-17 ENCOUNTER — Other Ambulatory Visit: Payer: Self-pay

## 2018-12-17 ENCOUNTER — Telehealth: Payer: Self-pay | Admitting: Family Medicine

## 2018-12-17 ENCOUNTER — Telehealth (INDEPENDENT_AMBULATORY_CARE_PROVIDER_SITE_OTHER): Payer: Medicaid Other | Admitting: Clinical

## 2018-12-17 DIAGNOSIS — Z634 Disappearance and death of family member: Secondary | ICD-10-CM | POA: Diagnosis not present

## 2018-12-17 DIAGNOSIS — F4321 Adjustment disorder with depressed mood: Secondary | ICD-10-CM | POA: Diagnosis not present

## 2018-12-17 NOTE — Telephone Encounter (Signed)
Attempted to call patient about her appointment on 8/27 @ 8:50. No answer, left voicemail instructing patient to wear a face mask for the entire appointment and no visitors are allowed during the visit. Patient instructed not to attend the appointment if she was any symptoms. Symptom list and office number left. Patient instructed to come fasting.

## 2018-12-17 NOTE — Addendum Note (Signed)
Addended by: Vesta Mixer C on: 12/17/2018 11:33 AM   Modules accepted: Orders

## 2018-12-18 ENCOUNTER — Other Ambulatory Visit: Payer: Medicaid Other

## 2018-12-18 ENCOUNTER — Ambulatory Visit (INDEPENDENT_AMBULATORY_CARE_PROVIDER_SITE_OTHER): Payer: Medicaid Other | Admitting: Lactation Services

## 2018-12-18 ENCOUNTER — Other Ambulatory Visit: Payer: Self-pay

## 2018-12-18 VITALS — BP 159/107 | HR 93

## 2018-12-18 DIAGNOSIS — I10 Essential (primary) hypertension: Secondary | ICD-10-CM

## 2018-12-18 DIAGNOSIS — O24419 Gestational diabetes mellitus in pregnancy, unspecified control: Secondary | ICD-10-CM

## 2018-12-18 MED ORDER — LABETALOL HCL 200 MG PO TABS: 400.0000 mg | ORAL_TABLET | Freq: Two times a day (BID) | ORAL | 0 refills | Status: DC

## 2018-12-18 NOTE — Addendum Note (Signed)
Addended by: Donn Pierini on: 12/18/2018 09:06 AM   Modules accepted: Orders

## 2018-12-18 NOTE — Progress Notes (Signed)
Pt in the office for 2 hour GTT. She reports her BP cuff at home gave her a BP reading of 180/22.   BP was checked and was 159/107 LA. Pt denies dizziness, HA, Blurred vision or seeing spots. She is taking Enalapril 20 mg BID, HCTZ 25 mg BID, and Labetalol 200 mg BID currently. She reports she is taking Xanax TID to help with sleep and is not able to sleep well. Pt with history of recent infant loss. She has been consulting with Vesta Mixer, LCSW.   Spoke with Dr. Roselie Awkward who ordered for her Labetalol to increase to 400 mg BID and to establish care with PCP. Called Primary Care at [facility] and made appt for September 16 at 2:30. Pt informed it will be in her My Chart app.

## 2018-12-19 LAB — GLUCOSE TOLERANCE, 2 HOURS
Glucose, 2 hour: 147 mg/dL — ABNORMAL HIGH (ref 65–139)
Glucose, GTT - Fasting: 106 mg/dL — ABNORMAL HIGH (ref 65–99)

## 2018-12-23 ENCOUNTER — Encounter: Payer: Self-pay | Admitting: General Practice

## 2019-01-06 ENCOUNTER — Telehealth: Payer: Self-pay

## 2019-01-06 NOTE — Telephone Encounter (Signed)

## 2019-01-07 ENCOUNTER — Other Ambulatory Visit: Payer: Self-pay

## 2019-01-07 ENCOUNTER — Ambulatory Visit (INDEPENDENT_AMBULATORY_CARE_PROVIDER_SITE_OTHER): Payer: Medicaid Other | Admitting: Nurse Practitioner

## 2019-01-07 ENCOUNTER — Encounter: Payer: Self-pay | Admitting: Nurse Practitioner

## 2019-01-07 VITALS — BP 163/108 | HR 96 | Temp 97.3°F | Resp 18 | Ht 66.0 in | Wt 283.0 lb

## 2019-01-07 DIAGNOSIS — Z6841 Body Mass Index (BMI) 40.0 and over, adult: Secondary | ICD-10-CM

## 2019-01-07 DIAGNOSIS — I10 Essential (primary) hypertension: Secondary | ICD-10-CM | POA: Diagnosis not present

## 2019-01-07 DIAGNOSIS — Z634 Disappearance and death of family member: Secondary | ICD-10-CM

## 2019-01-07 DIAGNOSIS — Z1231 Encounter for screening mammogram for malignant neoplasm of breast: Secondary | ICD-10-CM

## 2019-01-07 DIAGNOSIS — F5104 Psychophysiologic insomnia: Secondary | ICD-10-CM | POA: Diagnosis not present

## 2019-01-07 DIAGNOSIS — O24419 Gestational diabetes mellitus in pregnancy, unspecified control: Secondary | ICD-10-CM

## 2019-01-07 DIAGNOSIS — J4550 Severe persistent asthma, uncomplicated: Secondary | ICD-10-CM

## 2019-01-07 DIAGNOSIS — D649 Anemia, unspecified: Secondary | ICD-10-CM

## 2019-01-07 DIAGNOSIS — Z7689 Persons encountering health services in other specified circumstances: Secondary | ICD-10-CM

## 2019-01-07 DIAGNOSIS — K219 Gastro-esophageal reflux disease without esophagitis: Secondary | ICD-10-CM | POA: Diagnosis not present

## 2019-01-07 DIAGNOSIS — F4321 Adjustment disorder with depressed mood: Secondary | ICD-10-CM

## 2019-01-07 MED ORDER — BLOOD PRESSURE MONITOR DEVI: 0 refills | Status: DC

## 2019-01-07 MED ORDER — INCRUSE ELLIPTA 62.5 MCG/INH IN AEPB: 1.0000 | INHALATION_SPRAY | Freq: Every day | RESPIRATORY_TRACT | 11 refills | Status: DC

## 2019-01-07 MED ORDER — FUROSEMIDE 20 MG PO TABS: 20.0000 mg | ORAL_TABLET | Freq: Every day | ORAL | 0 refills | Status: DC

## 2019-01-07 MED ORDER — ESCITALOPRAM OXALATE 10 MG PO TABS: 10.0000 mg | ORAL_TABLET | Freq: Every day | ORAL | 2 refills | Status: DC

## 2019-01-07 MED ORDER — HYDROXYZINE HCL 50 MG PO TABS: 50.0000 mg | ORAL_TABLET | Freq: Three times a day (TID) | ORAL | 1 refills | Status: DC | PRN

## 2019-01-07 MED ORDER — LISINOPRIL-HYDROCHLOROTHIAZIDE 20-25 MG PO TABS: 1.0000 | ORAL_TABLET | Freq: Every day | ORAL | 3 refills | Status: DC

## 2019-01-07 MED ORDER — LANSOPRAZOLE 30 MG PO CPDR: 30.0000 mg | DELAYED_RELEASE_CAPSULE | Freq: Every day | ORAL | 5 refills | Status: DC

## 2019-01-07 MED ORDER — FERROUS SULFATE 325 (65 FE) MG PO TABS: 325.0000 mg | ORAL_TABLET | Freq: Every day | ORAL | 0 refills | Status: DC

## 2019-01-07 MED ORDER — CARVEDILOL 3.125 MG PO TABS: 3.1250 mg | ORAL_TABLET | Freq: Two times a day (BID) | ORAL | 3 refills | Status: DC

## 2019-01-07 NOTE — Progress Notes (Signed)
Hasn't been able to check BP at home because machine broke  Headaches, SHOB, swelling in her feet/ankles.  Denies chest pain.

## 2019-01-07 NOTE — Patient Instructions (Addendum)
Please have pt call Jonelle Sidle) directly M-TH 8:30-5 and F 8:30-noon at [phone] for scheduling. Thanks for the referral~stay safe!

## 2019-01-07 NOTE — Progress Notes (Signed)
Assessment & Plan:  Jenna Stephens was seen today for establish care and hypertension.  Diagnoses and all orders for this visit:  Encounter to establish care  Essential hypertension -     lisinopril-hydrochlorothiazide (ZESTORETIC) 20-25 MG tablet; Take 1 tablet by mouth daily. -     carvedilol (COREG) 3.125 MG tablet; Take 1 tablet (3.125 mg total) by mouth 2 (two) times daily with a meal. -     CMP14+EGFR -     Blood Pressure Monitor DEVI; Please provide patient with insurance approved blood pressure monitor Continue all antihypertensives as prescribed.  Remember to bring in your blood pressure log with you for your follow up appointment.  DASH/Mediterranean Diets are healthier choices for HTN.   Morbid obesity with BMI of 45.0-49.9, adult (HCC) -     TSH -     Lipid panel Discussed diet and exercise for person with BMI >40. Instructed: You must burn more calories than you eat. Losing 5 percent of your body weight should be considered a success. In the longer term, losing more than 15 percent of your body weight and staying at this weight is an extremely good result. However, keep in mind that even losing 5 percent of your body weight leads to important health benefits, so try not to get discouraged if you're not able to lose more than this. Will recheck weight in 3-6 months.  Gastroesophageal reflux disease, esophagitis presence not specified -     lansoprazole (PREVACID) 30 MG capsule; Take 1 capsule (30 mg total) by mouth daily at 12 noon. INSTRUCTIONS: Avoid GERD Triggers: acidic, spicy or fried foods, caffeine, coffee, sodas,  alcohol and chocolate.   Breast cancer screening by mammogram -     MM 3D SCREEN BREAST BILATERAL; Future  Anemia, unspecified type -     ferrous sulfate 325 (65 FE) MG tablet; Take 1 tablet (325 mg total) by mouth daily with breakfast. -     CBC  Grief at loss of child -     escitalopram (LEXAPRO) 10 MG tablet; Take 1 tablet (10 mg total) by mouth daily. -      hydrOXYzine (ATARAX/VISTARIL) 50 MG tablet; Take 1 tablet (50 mg total) by mouth 3 (three) times daily as needed for anxiety.  Gestational diabetes mellitus (GDM) affecting pregnancy, antepartum -     Hemoglobin A1c Continue blood sugar control as discussed in office today, low carbohydrate diet, and regular physical exercise as tolerated, 150 minutes per week (30 min each day, 5 days per week, or 50 min 3 days per week).  Psychophysiological insomnia Will address at next office visit  Severe persistent asthma without complication -     umeclidinium bromide (INCRUSE ELLIPTA) 62.5 MCG/INH AEPB; Inhale 1 puff into the lungs daily.   Patient has been counseled on age-appropriate routine health concerns for screening and prevention. These are reviewed and up-to-date. Referrals have been placed accordingly. Immunizations are up-to-date or declined.    Subjective:   Chief Complaint  Patient presents with  . Establish Care  . Hypertension   HPI Jenna Stephens 43 y.o. female presents to office today to establish care.  has a past medical history of Acute respiratory failure ([place]) (12/29/2017), Anxiety, Asthma, Gestational diabetes, Hypertension, Iron deficiency anemia, and Obesity.    Anxiety and Depression She delivered a baby girl on 10-07-2018 at 32 weeks with severe pre eclampsia and the newborn died on 11-08-2018. She is having a difficult time with grief. Has supportive spouse at home.  Would like to start antidepressant today. Has taken paxil, wellbutrin and zoloft in the past but states ineffective. Will start lexapro today. She denies any current thoughts of self harm.  Depression screen [facility] 2/9 01/07/2019 09/23/2018 07/07/2018  Decreased Interest _0 Down, Depressed, Hopeless 3 0 0  PHQ - 2 Score _1 Altered sleeping _2 Tired, decreased energy _3 Change in appetite 3 0 0  Feeling bad or failure about yourself  2 0 0  Trouble concentrating 3 0 0  Moving slowly or  fidgety/restless 2 0 0  Suicidal thoughts 1 0 0  PHQ-9 Score _4 GAD 7 : Generalized Anxiety Score 01/07/2019 07/07/2018  Nervous, Anxious, on Edge 3 2  Control/stop worrying 3 2  Worry too much - different things 3 2  Trouble relaxing 3 2  Restless 3 1  Easily annoyed or irritable 3 1  Afraid - awful might happen 3 1  Total GAD 7 Score 21 11     Essential Hypertension Still not well controlled. Currently taking vasotec 20 mg BID, HCTZ 25 mg BID, labetalol 400 mg BID. She was monitoring her blood pressure at home however she monitor was broken and now she is unable to monitor. Will switch bp meds and start on carvedilol and zestoretic. Have her return in a few weeks for BP check.I have also ordered her a new BP monitor through her insurance today.  BP Readings from Last 3 Encounters:  01/07/19 (!) 163/108  12/18/18 (!) 159/107  12/18/18 (!) 159/107    Review of Systems  Constitutional: Negative for fever, malaise/fatigue and weight loss.  HENT: Negative.  Negative for nosebleeds.   Eyes: Negative.  Negative for blurred vision, double vision and photophobia.  Respiratory: Positive for shortness of breath. Negative for cough.   Cardiovascular: Positive for leg swelling. Negative for chest pain and palpitations.  Gastrointestinal: Positive for heartburn. Negative for nausea and vomiting.  Musculoskeletal: Negative.  Negative for myalgias.  Neurological: Negative.  Negative for dizziness, focal weakness, seizures and headaches.  Psychiatric/Behavioral: Positive for depression. Negative for suicidal ideas. The patient is nervous/anxious and has insomnia.     Past Medical History:  Diagnosis Date  . Acute respiratory failure ([place]) 12/29/2017  . Anxiety   . Asthma   . Gestational diabetes   . Hypertension   . Iron deficiency anemia   . Obesity     Past Surgical History:  Procedure Laterality Date  . TONSILLECTOMY    . TUBAL LIGATION N/A 10/07/2018   Procedure: POST PARTUM  TUBAL LIGATION;  Surgeon: Truett Mainland, DO;  Location: [facility] LD [facility]S;  Service: Gynecology;  Laterality: N/A;    Family History  Problem Relation Age of Onset  . Diabetes Mother   . Hypertension Mother   . Hypertension Father   . COPD Father     Social History Reviewed with no changes to be made today.   Outpatient Medications Prior to Visit  Medication Sig Dispense Refill  . fluticasone furoate-vilanterol (BREO ELLIPTA) 200-25 MCG/INH AEPB Inhale 1 puff into the lungs daily. 28 each 11  . ALPRAZolam (XANAX) 0.5 MG tablet Take 1 tablet (0.5 mg total) by mouth 3 (three) times daily as needed for anxiety or sleep. 90 tablet 2  . enalapril (VASOTEC) 20 MG tablet Take 1 tablet (20 mg total) by mouth 2 (two) times daily. 60 tablet 2  . hydrochlorothiazide (HYDRODIURIL) 25 MG tablet Take  1 tablet (25 mg total) by mouth 2 (two) times daily. 60 tablet 3  . labetalol (NORMODYNE) 200 MG tablet Take 2 tablets (400 mg total) by mouth 2 (two) times daily. 60 tablet 0  . lansoprazole (PREVACID) 30 MG capsule Take 1 capsule (30 mg total) by mouth daily at 12 noon. 30 capsule 5  . umeclidinium bromide (INCRUSE ELLIPTA) 62.5 MCG/INH AEPB Inhale 1 puff into the lungs daily. 30 each 11  . acetaminophen (TYLENOL) 325 MG tablet Take 2 tablets (650 mg total) by mouth every 4 (four) hours as needed (for pain scale < 4).    . albuterol (PROVENTIL HFA;VENTOLIN HFA) 108 (90 Base) MCG/ACT inhaler Inhale 2 puffs into the lungs every 6 (six) hours as needed for wheezing or shortness of breath. 1 Inhaler 2  . ferrous sulfate 325 (65 FE) MG tablet Take 1 tablet (325 mg total) by mouth daily with breakfast for 30 days. 30 tablet 0  . ibuprofen (ADVIL) 600 MG tablet Take 1 tablet (600 mg total) by mouth every 6 (six) hours as needed. (Patient not taking: Reported on 12/18/2018) 30 tablet 0  . montelukast (SINGULAIR) 10 MG tablet Take 1 tablet (10 mg total) by mouth daily. 30 tablet 2  . oxyCODONE-acetaminophen  (PERCOCET/ROXICET) 5-325 MG tablet Take 1 tablet by mouth every 8 (eight) hours as needed for moderate pain. (Patient not taking: Reported on 11/04/2018) 4 tablet 0  . Prenatal Vit-Fe Fumarate-FA (PRENATAL MULTIVITAMIN) TABS tablet Take 1 tablet by mouth daily at 12 noon.    . promethazine (PHENERGAN) 25 MG tablet Take 1 tablet by mouth 2 (two) times daily.     No facility-administered medications prior to visit.     No Known Allergies     Objective:    BP (!) 163/108   Pulse 96   Temp (!) 97.3 F (36.3 C) (Temporal)   Resp 18   Ht _0  (1.676 m)   Wt 283 lb (128.4 kg)   SpO2 93%   BMI 45.68 kg/m  Wt Readings from Last 3 Encounters:  01/07/19 283 lb (128.4 kg)  11/06/18 270 lb 6.4 oz (122.7 kg)  10/03/18 283 lb (128.4 kg)    Physical Exam Vitals signs and nursing note reviewed.  Constitutional:      Appearance: She is well-developed.  HENT:     Head: Normocephalic and atraumatic.  Neck:     Musculoskeletal: Normal range of motion.  Cardiovascular:     Rate and Rhythm: Normal rate and regular rhythm.     Heart sounds: Normal heart sounds. No murmur. No friction rub. No gallop.   Pulmonary:     Effort: Tachypnea present. No accessory muscle usage, respiratory distress or retractions.     Breath sounds: No wheezing, rhonchi or rales.  Chest:     Chest wall: No tenderness.  Abdominal:     General: Bowel sounds are normal.     Palpations: Abdomen is soft.  Musculoskeletal: Normal range of motion.  Skin:    General: Skin is warm and dry.  Neurological:     Mental Status: She is alert and oriented to person, place, and time.     Coordination: Coordination normal.  Psychiatric:        Behavior: Behavior normal. Behavior is cooperative.        Thought Content: Thought content normal.        Judgment: Judgment normal.        Patient has been counseled extensively about nutrition and exercise  as well as the importance of adherence with medications and regular  follow-up. The patient was given clear instructions to go to ER or return to medical center if symptoms don't improve, worsen or new problems develop. The patient verbalized understanding.   Follow-up: Return in about 3 weeks (around 01/28/2019) for 3 weeks with me BP CHECK/DEPRESSION.   Gildardo Pounds, FNP-BC [facility] and [place], [place]   01/07/2019, 5:44 PM

## 2019-01-08 LAB — CMP14+EGFR
ALT: 13 IU/L (ref 0–32)
AST: 12 IU/L (ref 0–40)
Albumin/Globulin Ratio: 1.8 (ref 1.2–2.2)
Albumin: 4.2 g/dL (ref 3.8–4.8)
Alkaline Phosphatase: 61 IU/L (ref 39–117)
BUN/Creatinine Ratio: 16 (ref 9–23)
BUN: 12 mg/dL (ref 6–24)
Bilirubin Total: 0.2 mg/dL (ref 0.0–1.2)
CO2: 22 mmol/L (ref 20–29)
Calcium: 9.4 mg/dL (ref 8.7–10.2)
Chloride: 106 mmol/L (ref 96–106)
Creatinine, Ser: 0.75 mg/dL (ref 0.57–1.00)
GFR calc Af Amer: 114 mL/min/{1.73_m2} (ref >59)
GFR calc non Af Amer: 99 mL/min/{1.73_m2} (ref >59)
Globulin, Total: 2.3 g/dL (ref 1.5–4.5)
Glucose: 86 mg/dL (ref 65–99)
Potassium: 4.1 mmol/L (ref 3.5–5.2)
Sodium: 139 mmol/L (ref 134–144)
Total Protein: 6.5 g/dL (ref 6.0–8.5)

## 2019-01-08 LAB — CBC
Hematocrit: 37.3 % (ref 34.0–46.6)
Hemoglobin: 11.2 g/dL (ref 11.1–15.9)
MCH: 22.3 pg — ABNORMAL LOW (ref 26.6–33.0)
MCHC: 30 g/dL — ABNORMAL LOW (ref 31.5–35.7)
MCV: 74 fL — ABNORMAL LOW (ref 79–97)
Platelets: 318 10*3/uL (ref 150–450)
RBC: 5.03 x10E6/uL (ref 3.77–5.28)
RDW: 15.8 % — ABNORMAL HIGH (ref 11.7–15.4)
WBC: 12.3 10*3/uL — ABNORMAL HIGH (ref 3.4–10.8)

## 2019-01-08 LAB — HEMOGLOBIN A1C
Est. average glucose Bld gHb Est-mCnc: 103 mg/dL
Hgb A1c MFr Bld: 5.2 % (ref 4.8–5.6)

## 2019-01-08 LAB — LIPID PANEL
Chol/HDL Ratio: 3.8 ratio (ref 0.0–4.4)
Cholesterol, Total: 255 mg/dL — ABNORMAL HIGH (ref 100–199)
HDL: 68 mg/dL (ref >39)
LDL Chol Calc (NIH): 145 mg/dL — ABNORMAL HIGH (ref 0–99)
Triglycerides: 232 mg/dL — ABNORMAL HIGH (ref 0–149)
VLDL Cholesterol Cal: 42 mg/dL — ABNORMAL HIGH (ref 5–40)

## 2019-01-08 LAB — TSH: TSH: 0.664 u[IU]/mL (ref 0.450–4.500)

## 2019-01-28 ENCOUNTER — Ambulatory Visit (INDEPENDENT_AMBULATORY_CARE_PROVIDER_SITE_OTHER): Payer: Medicaid Other | Admitting: Nurse Practitioner

## 2019-01-28 ENCOUNTER — Other Ambulatory Visit: Payer: Self-pay

## 2019-01-28 VITALS — BP 163/119 | HR 98 | Temp 97.3°F | Resp 17 | Wt 277.8 lb

## 2019-01-28 DIAGNOSIS — Z634 Disappearance and death of family member: Secondary | ICD-10-CM

## 2019-01-28 DIAGNOSIS — G4700 Insomnia, unspecified: Secondary | ICD-10-CM

## 2019-01-28 DIAGNOSIS — G4739 Other sleep apnea: Secondary | ICD-10-CM

## 2019-01-28 DIAGNOSIS — F4321 Adjustment disorder with depressed mood: Secondary | ICD-10-CM

## 2019-01-28 DIAGNOSIS — I10 Essential (primary) hypertension: Secondary | ICD-10-CM

## 2019-01-28 MED ORDER — AMLODIPINE BESYLATE 5 MG PO TABS: 5.0000 mg | ORAL_TABLET | Freq: Every day | ORAL | 3 refills | Status: DC

## 2019-01-28 MED ORDER — TRAZODONE HCL 100 MG PO TABS: 100.0000 mg | ORAL_TABLET | Freq: Every evening | ORAL | 0 refills | Status: DC | PRN

## 2019-01-28 MED ORDER — ESCITALOPRAM OXALATE 20 MG PO TABS: 20.0000 mg | ORAL_TABLET | Freq: Every day | ORAL | 2 refills | Status: DC

## 2019-01-28 NOTE — Progress Notes (Signed)
Doesn't check BP at home. Never received new monitor.  Has some SHOB & lower extremity swelling.  Denies chest pain, headaches, palpitations, dizziness.  Has taken BP medications today.

## 2019-01-28 NOTE — Progress Notes (Signed)
Assessment & Plan:  Jenna Stephens was seen today for hypertension.  Diagnoses and all orders for this visit:  Essential hypertension -     amLODipine (NORVASC) 5 MG tablet; Take 1 tablet (5 mg total) by mouth daily. -     Blood Pressure Monitor DEVI; Please provide patient with insurance approved blood pressure monitor  Grief at loss of child -     escitalopram (LEXAPRO) 20 MG tablet; Take 1 tablet (20 mg total) by mouth daily.  Insomnia, unspecified type -     traZODone (DESYREL) 100 MG tablet; Take 1 tablet (100 mg total) by mouth at bedtime as needed for sleep.  Sleep apnea-like behavior -     Split night study; Future    Patient has been counseled on age-appropriate routine health concerns for screening and prevention. These are reviewed and up-to-date. Referrals have been placed accordingly. Immunizations are up-to-date or declined.    Subjective:   Chief Complaint  Patient presents with  . Hypertension   HPI Jenna Stephens 43 y.o. female presents to office today for follow up to HTN.    Essential Hypertension Will need to add amlodipine 5 mg today. Blood pressure is poorly controlled with lisinopril-HCTZ 20-25 g daily and carvediolol 3.125 mg BID. Denies chest pain, shortness of breath, palpitations, lightheadedness, dizziness, headaches or BLE edema. States she still has not received her blood pressure monitor.  BP Readings from Last 3 Encounters:  01/28/19 (!) 163/119  01/07/19 (!) 163/108  12/18/18 (!) 159/107    Depression and Insomnia Improved however she still reports mood lability. Will increase lexapro from 10mg  to 20 mg. Associated symptoms: insomnia. Will start trazodone for insomnia.  Depression screen [facility] 2/9 01/28/2019 01/07/2019 09/23/2018 07/07/2018  Decreased Interest 1 3 2 1   Down, Depressed, Hopeless 1 3 0 0  PHQ - 2 Score 2 6 2 1   Altered sleeping 1 3 3 2   Tired, decreased energy 1 3 2 2   Change in appetite 1 3 0 0  Feeling bad or failure about  yourself  1 2 0 0  Trouble concentrating 3 3 0 0  Moving slowly or fidgety/restless 0 2 0 0  Suicidal thoughts 0 1 0 0  PHQ-9 Score 9 23 7 5     OSA Needs evaluation for OSA. Poorly controlled HTN despite multiple antihypertensives. She endorsing snoring at night, excessive daytime fatigue, shortness of breath in the middle of the night that awakens  her from sleep.    Review of Systems  Constitutional: Negative for fever, malaise/fatigue and weight loss.  HENT: Negative.  Negative for nosebleeds.   Eyes: Negative.  Negative for blurred vision, double vision and photophobia.  Respiratory: Negative.  Negative for cough and shortness of breath.   Cardiovascular: Negative.  Negative for chest pain, palpitations and leg swelling.  Gastrointestinal: Negative.  Negative for heartburn, nausea and vomiting.  Musculoskeletal: Negative.  Negative for myalgias.  Neurological: Negative.  Negative for dizziness, focal weakness, seizures and headaches.  Psychiatric/Behavioral: Positive for depression. Negative for hallucinations, memory loss and suicidal ideas. The patient is nervous/anxious and has insomnia.     Past Medical History:  Diagnosis Date  . Acute respiratory failure ([place]) 12/29/2017  . Anxiety   . Asthma   . Gestational diabetes   . Hypertension   . Iron deficiency anemia   . Obesity     Past Surgical History:  Procedure Laterality Date  . TONSILLECTOMY    . TUBAL LIGATION N/A 10/07/2018   Procedure:  POST PARTUM TUBAL LIGATION;  Surgeon: Truett Mainland, DO;  Location: [facility] LD [facility];  Service: Gynecology;  Laterality: N/A;    Family History  Problem Relation Age of Onset  . Diabetes Mother   . Hypertension Mother   . Hypertension Father   . COPD Father     Social History Reviewed with no changes to be made today.   Outpatient Medications Prior to Visit  Medication Sig Dispense Refill  . carvedilol (COREG) 3.125 MG tablet Take 1 tablet (3.125 mg total) by mouth 2 (two)  times daily with a meal. 60 tablet 3  . ferrous sulfate 325 (65 FE) MG tablet Take 1 tablet (325 mg total) by mouth daily with breakfast. 30 tablet 0  . fluticasone furoate-vilanterol (BREO ELLIPTA) 200-25 MCG/INH AEPB Inhale 1 puff into the lungs daily. 28 each 11  . hydrOXYzine (ATARAX/VISTARIL) 50 MG tablet Take 1 tablet (50 mg total) by mouth 3 (three) times daily as needed for anxiety. 60 tablet 1  . lansoprazole (PREVACID) 30 MG capsule Take 1 capsule (30 mg total) by mouth daily at 12 noon. 30 capsule 5  . lisinopril-hydrochlorothiazide (ZESTORETIC) 20-25 MG tablet Take 1 tablet by mouth daily. 30 tablet 3  . umeclidinium bromide (INCRUSE ELLIPTA) 62.5 MCG/INH AEPB Inhale 1 puff into the lungs daily. 30 each 11  . escitalopram (LEXAPRO) 10 MG tablet Take 1 tablet (10 mg total) by mouth daily. 90 tablet 2  . Blood Pressure Monitor DEVI Please provide patient with insurance approved blood pressure monitor (Patient not taking: Reported on 01/28/2019) 1 Device 0   No facility-administered medications prior to visit.     No Known Allergies     Objective:    BP (!) 163/119   Pulse 98   Temp (!) 97.3 F (36.3 C) (Temporal)   Resp 17   Wt 277 lb 12.8 oz (126 kg)   SpO2 96%   BMI 44.84 kg/m  Wt Readings from Last 3 Encounters:  01/28/19 277 lb 12.8 oz (126 kg)  01/07/19 283 lb (128.4 kg)  11/06/18 270 lb 6.4 oz (122.7 kg)    Physical Exam Vitals signs and nursing note reviewed.  Constitutional:      Appearance: She is well-developed.  HENT:     Head: Normocephalic and atraumatic.  Neck:     Musculoskeletal: Normal range of motion.  Cardiovascular:     Rate and Rhythm: Normal rate and regular rhythm.     Heart sounds: Normal heart sounds. No murmur. No friction rub. No gallop.   Pulmonary:     Effort: Pulmonary effort is normal. No tachypnea or respiratory distress.     Breath sounds: Normal breath sounds. No decreased breath sounds, wheezing, rhonchi or rales.  Chest:      Chest wall: No tenderness.  Abdominal:     General: Bowel sounds are normal.     Palpations: Abdomen is soft.  Musculoskeletal: Normal range of motion.  Skin:    General: Skin is warm and dry.  Neurological:     Mental Status: She is alert and oriented to person, place, and time.     Coordination: Coordination normal.  Psychiatric:        Behavior: Behavior normal. Behavior is cooperative.        Thought Content: Thought content normal.        Judgment: Judgment normal.          Patient has been counseled extensively about nutrition and exercise as well as the importance  of adherence with medications and regular follow-up. The patient was given clear instructions to go to ER or return to medical center if symptoms don't improve, worsen or new problems develop. The patient verbalized understanding.   Follow-up: Return in about 2 weeks (around 02/11/2019) for BP recheck.   Gildardo Pounds, FNP-BC [facility] and [place], [place]   02/01/2019, 3:09 PM

## 2019-02-01 ENCOUNTER — Encounter: Payer: Self-pay | Admitting: Nurse Practitioner

## 2019-02-01 MED ORDER — BLOOD PRESSURE MONITOR DEVI: 0 refills | Status: DC

## 2019-02-02 ENCOUNTER — Emergency Department (HOSPITAL_COMMUNITY): Payer: Medicaid Other

## 2019-02-02 ENCOUNTER — Other Ambulatory Visit: Payer: Self-pay

## 2019-02-02 ENCOUNTER — Encounter (HOSPITAL_COMMUNITY): Payer: Self-pay | Admitting: Emergency Medicine

## 2019-02-02 ENCOUNTER — Inpatient Hospital Stay (HOSPITAL_COMMUNITY)
Admission: EM | Admit: 2019-02-02 | Discharge: 2019-02-04 | DRG: 202 | Disposition: A | Discharge status: Home / Self Care | Payer: Medicaid Other | Attending: Internal Medicine | Admitting: Internal Medicine

## 2019-02-02 DIAGNOSIS — R000 Tachycardia, unspecified: Secondary | ICD-10-CM | POA: Diagnosis present

## 2019-02-02 DIAGNOSIS — E669 Obesity, unspecified: Secondary | ICD-10-CM | POA: Diagnosis present

## 2019-02-02 DIAGNOSIS — F17210 Nicotine dependence, cigarettes, uncomplicated: Secondary | ICD-10-CM | POA: Diagnosis present

## 2019-02-02 DIAGNOSIS — Z825 Family history of asthma and other chronic lower respiratory diseases: Secondary | ICD-10-CM

## 2019-02-02 DIAGNOSIS — J4550 Severe persistent asthma, uncomplicated: Secondary | ICD-10-CM | POA: Diagnosis present

## 2019-02-02 DIAGNOSIS — Z833 Family history of diabetes mellitus: Secondary | ICD-10-CM

## 2019-02-02 DIAGNOSIS — D509 Iron deficiency anemia, unspecified: Secondary | ICD-10-CM | POA: Diagnosis present

## 2019-02-02 DIAGNOSIS — Z8632 Personal history of gestational diabetes: Secondary | ICD-10-CM

## 2019-02-02 DIAGNOSIS — Z79899 Other long term (current) drug therapy: Secondary | ICD-10-CM

## 2019-02-02 DIAGNOSIS — Z8249 Family history of ischemic heart disease and other diseases of the circulatory system: Secondary | ICD-10-CM

## 2019-02-02 DIAGNOSIS — J4551 Severe persistent asthma with (acute) exacerbation: Principal | ICD-10-CM | POA: Diagnosis present

## 2019-02-02 DIAGNOSIS — R0603 Acute respiratory distress: Secondary | ICD-10-CM

## 2019-02-02 DIAGNOSIS — F419 Anxiety disorder, unspecified: Secondary | ICD-10-CM | POA: Diagnosis present

## 2019-02-02 DIAGNOSIS — J45901 Unspecified asthma with (acute) exacerbation: Secondary | ICD-10-CM

## 2019-02-02 DIAGNOSIS — K219 Gastro-esophageal reflux disease without esophagitis: Secondary | ICD-10-CM | POA: Diagnosis present

## 2019-02-02 DIAGNOSIS — Z6841 Body Mass Index (BMI) 40.0 and over, adult: Secondary | ICD-10-CM

## 2019-02-02 DIAGNOSIS — Z20828 Contact with and (suspected) exposure to other viral communicable diseases: Secondary | ICD-10-CM | POA: Diagnosis present

## 2019-02-02 DIAGNOSIS — I10 Essential (primary) hypertension: Secondary | ICD-10-CM | POA: Diagnosis present

## 2019-02-02 LAB — IRON AND TIBC
Iron: 26 ug/dL — ABNORMAL LOW (ref 28–170)
Saturation Ratios: 5 % — ABNORMAL LOW (ref 10.4–31.8)
TIBC: 505 ug/dL — ABNORMAL HIGH (ref 250–450)
UIBC: 479 ug/dL

## 2019-02-02 LAB — CBC WITH DIFFERENTIAL/PLATELET
Abs Immature Granulocytes: 0.05 10*3/uL (ref 0.00–0.07)
Basophils Absolute: 0 10*3/uL (ref 0.0–0.1)
Basophils Relative: 1 %
Eosinophils Absolute: 0.4 10*3/uL (ref 0.0–0.5)
Eosinophils Relative: 5 %
HCT: 40.1 % (ref 36.0–46.0)
Hemoglobin: 11.8 g/dL — ABNORMAL LOW (ref 12.0–15.0)
Immature Granulocytes: 1 %
Lymphocytes Relative: 25 %
Lymphs Abs: 2 10*3/uL (ref 0.7–4.0)
MCH: 22.2 pg — ABNORMAL LOW (ref 26.0–34.0)
MCHC: 29.4 g/dL — ABNORMAL LOW (ref 30.0–36.0)
MCV: 75.4 fL — ABNORMAL LOW (ref 80.0–100.0)
Monocytes Absolute: 0.9 10*3/uL (ref 0.1–1.0)
Monocytes Relative: 11 %
Neutro Abs: 4.8 10*3/uL (ref 1.7–7.7)
Neutrophils Relative %: 57 %
Platelets: 295 10*3/uL (ref 150–400)
RBC: 5.32 MIL/uL — ABNORMAL HIGH (ref 3.87–5.11)
RDW: 15.7 % — ABNORMAL HIGH (ref 11.5–15.5)
WBC: 8.2 10*3/uL (ref 4.0–10.5)
nRBC: 0 % (ref 0.0–0.2)

## 2019-02-02 LAB — CBC
HCT: 39.5 % (ref 36.0–46.0)
Hemoglobin: 11.8 g/dL — ABNORMAL LOW (ref 12.0–15.0)
MCH: 22.3 pg — ABNORMAL LOW (ref 26.0–34.0)
MCHC: 29.9 g/dL — ABNORMAL LOW (ref 30.0–36.0)
MCV: 74.7 fL — ABNORMAL LOW (ref 80.0–100.0)
Platelets: 333 10*3/uL (ref 150–400)
RBC: 5.29 MIL/uL — ABNORMAL HIGH (ref 3.87–5.11)
RDW: 15.6 % — ABNORMAL HIGH (ref 11.5–15.5)
WBC: 10.3 10*3/uL (ref 4.0–10.5)
nRBC: 0 % (ref 0.0–0.2)

## 2019-02-02 LAB — BASIC METABOLIC PANEL
Anion gap: 9 (ref 5–15)
BUN: 9 mg/dL (ref 6–20)
CO2: 22 mmol/L (ref 22–32)
Calcium: 8.7 mg/dL — ABNORMAL LOW (ref 8.9–10.3)
Chloride: 106 mmol/L (ref 98–111)
Creatinine, Ser: 0.75 mg/dL (ref 0.44–1.00)
GFR calc Af Amer: >60 mL/min (ref >60)
GFR calc non Af Amer: >60 mL/min (ref >60)
Glucose, Bld: 109 mg/dL — ABNORMAL HIGH (ref 70–99)
Potassium: 3.3 mmol/L — ABNORMAL LOW (ref 3.5–5.1)
Sodium: 137 mmol/L (ref 135–145)

## 2019-02-02 LAB — VITAMIN B12: Vitamin B-12: 400 pg/mL (ref 180–914)

## 2019-02-02 LAB — CREATININE, SERUM
Creatinine, Ser: 0.87 mg/dL (ref 0.44–1.00)
GFR calc Af Amer: >60 mL/min (ref >60)
GFR calc non Af Amer: >60 mL/min (ref >60)

## 2019-02-02 LAB — TSH: TSH: 0.275 u[IU]/mL — ABNORMAL LOW (ref 0.350–4.500)

## 2019-02-02 LAB — FERRITIN: Ferritin: 11 ng/mL (ref 11–307)

## 2019-02-02 LAB — CBG MONITORING, ED
Glucose-Capillary: 194 mg/dL — ABNORMAL HIGH (ref 70–99)
Glucose-Capillary: 207 mg/dL — ABNORMAL HIGH (ref 70–99)
Glucose-Capillary: 196 mg/dL — ABNORMAL HIGH (ref 70–99)

## 2019-02-02 LAB — SARS CORONAVIRUS 2 (TAT 6-24 HRS): SARS Coronavirus 2: NEGATIVE

## 2019-02-02 MED ORDER — HEPARIN SODIUM (PORCINE) 5000 UNIT/ML IJ SOLN
5000.0000 [IU] | Freq: Three times a day (TID) | INTRAMUSCULAR | Status: DC
  Administered 2019-02-03 – 2019-02-04 (×3): 5000 [IU] via SUBCUTANEOUS
  Filled 2019-02-02 (×5): qty 1

## 2019-02-02 MED ORDER — ACETAMINOPHEN 325 MG PO TABS
650.0000 mg | ORAL_TABLET | Freq: Four times a day (QID) | ORAL | Status: DC | PRN
  Administered 2019-02-03: 650 mg via ORAL
  Filled 2019-02-02: qty 2

## 2019-02-02 MED ORDER — FERROUS SULFATE 325 (65 FE) MG PO TABS
325.0000 mg | ORAL_TABLET | Freq: Every day | ORAL | Status: DC
  Filled 2019-02-02: qty 1

## 2019-02-02 MED ORDER — HYDROXYZINE HCL 25 MG PO TABS: 50.0000 mg | ORAL_TABLET | Freq: Three times a day (TID) | ORAL | Status: DC | PRN

## 2019-02-02 MED ORDER — PANTOPRAZOLE SODIUM 40 MG PO TBEC
40.0000 mg | DELAYED_RELEASE_TABLET | Freq: Every day | ORAL | Status: DC
  Administered 2019-02-02 – 2019-02-03 (×2): 40 mg via ORAL
  Filled 2019-02-02 (×2): qty 1

## 2019-02-02 MED ORDER — ALBUTEROL (5 MG/ML) CONTINUOUS INHALATION SOLN
2.5000 mg/h | INHALATION_SOLUTION | RESPIRATORY_TRACT | Status: AC
  Administered 2019-02-02: 2.5 mg/h via RESPIRATORY_TRACT
  Filled 2019-02-02: qty 20

## 2019-02-02 MED ORDER — SENNOSIDES-DOCUSATE SODIUM 8.6-50 MG PO TABS: 2.0000 | ORAL_TABLET | Freq: Every evening | ORAL | Status: DC | PRN

## 2019-02-02 MED ORDER — INSULIN ASPART 100 UNIT/ML ~~LOC~~ SOLN
0.0000 [IU] | Freq: Every day | SUBCUTANEOUS | Status: DC
  Administered 2019-02-02 – 2019-02-03 (×2): 2 [IU] via SUBCUTANEOUS
  Filled 2019-02-02: qty 0.05

## 2019-02-02 MED ORDER — MAGNESIUM SULFATE 50 % IJ SOLN: 2.0000 g | Freq: Once | INTRAMUSCULAR | Status: DC

## 2019-02-02 MED ORDER — MAGNESIUM SULFATE 2 GM/50ML IV SOLN
2.0000 g | Freq: Once | INTRAVENOUS | Status: AC
  Administered 2019-02-02: 2 g via INTRAVENOUS
  Filled 2019-02-02: qty 50

## 2019-02-02 MED ORDER — AMLODIPINE BESYLATE 5 MG PO TABS
5.0000 mg | ORAL_TABLET | Freq: Once | ORAL | Status: AC
  Administered 2019-02-02: 5 mg via ORAL
  Filled 2019-02-02: qty 1

## 2019-02-02 MED ORDER — AMLODIPINE BESYLATE 5 MG PO TABS
5.0000 mg | ORAL_TABLET | Freq: Every day | ORAL | Status: DC
  Administered 2019-02-02 – 2019-02-03 (×2): 5 mg via ORAL
  Filled 2019-02-02 (×2): qty 1

## 2019-02-02 MED ORDER — METHYLPREDNISOLONE SODIUM SUCC 125 MG IJ SOLR
60.0000 mg | Freq: Once | INTRAMUSCULAR | Status: AC
  Administered 2019-02-02: 60 mg via INTRAVENOUS
  Filled 2019-02-02: qty 2

## 2019-02-02 MED ORDER — ALBUTEROL SULFATE (2.5 MG/3ML) 0.083% IN NEBU
5.0000 mg | INHALATION_SOLUTION | Freq: Once | RESPIRATORY_TRACT | Status: AC
  Administered 2019-02-02: 5 mg via RESPIRATORY_TRACT
  Filled 2019-02-02: qty 6

## 2019-02-02 MED ORDER — ESCITALOPRAM OXALATE 20 MG PO TABS
20.0000 mg | ORAL_TABLET | Freq: Every day | ORAL | Status: DC
  Administered 2019-02-02 – 2019-02-03 (×2): 20 mg via ORAL
  Filled 2019-02-02 (×2): qty 2

## 2019-02-02 MED ORDER — INSULIN ASPART 100 UNIT/ML ~~LOC~~ SOLN
0.0000 [IU] | Freq: Three times a day (TID) | SUBCUTANEOUS | Status: DC
  Administered 2019-02-02: 3 [IU] via SUBCUTANEOUS
  Administered 2019-02-03: 2 [IU] via SUBCUTANEOUS
  Administered 2019-02-03: 3 [IU] via SUBCUTANEOUS
  Administered 2019-02-03: 2 [IU] via SUBCUTANEOUS
  Filled 2019-02-02: qty 0.15

## 2019-02-02 MED ORDER — TRAZODONE HCL 100 MG PO TABS
100.0000 mg | ORAL_TABLET | Freq: Every evening | ORAL | Status: DC | PRN
  Administered 2019-02-03: 100 mg via ORAL
  Filled 2019-02-02: qty 1

## 2019-02-02 MED ORDER — PHENYLEPHRINE-APAP-GUAIFENESIN 10-650-400 MG/20ML PO LIQD: 20.0000 mL | ORAL | Status: DC | PRN

## 2019-02-02 MED ORDER — POLYETHYLENE GLYCOL 3350 17 G PO PACK: 17.0000 g | PACK | Freq: Every day | ORAL | Status: DC | PRN

## 2019-02-02 MED ORDER — HYDRALAZINE HCL 20 MG/ML IJ SOLN
10.0000 mg | INTRAMUSCULAR | Status: DC | PRN
  Administered 2019-02-03 (×2): 10 mg via INTRAVENOUS
  Filled 2019-02-02 (×2): qty 1

## 2019-02-02 MED ORDER — METHYLPREDNISOLONE SODIUM SUCC 40 MG IJ SOLR
40.0000 mg | Freq: Three times a day (TID) | INTRAMUSCULAR | Status: DC
  Administered 2019-02-02 – 2019-02-04 (×5): 40 mg via INTRAVENOUS
  Filled 2019-02-02 (×5): qty 1

## 2019-02-02 MED ORDER — POTASSIUM CHLORIDE CRYS ER 20 MEQ PO TBCR
40.0000 meq | EXTENDED_RELEASE_TABLET | Freq: Once | ORAL | Status: AC
  Administered 2019-02-02: 40 meq via ORAL
  Filled 2019-02-02: qty 2

## 2019-02-02 MED ORDER — FLUTICASONE FUROATE-VILANTEROL 200-25 MCG/INH IN AEPB
1.0000 | INHALATION_SPRAY | Freq: Every day | RESPIRATORY_TRACT | Status: DC
  Filled 2019-02-02: qty 28

## 2019-02-02 MED ORDER — ACETAMINOPHEN 650 MG RE SUPP: 650.0000 mg | Freq: Four times a day (QID) | RECTAL | Status: DC | PRN

## 2019-02-02 MED ORDER — UMECLIDINIUM BROMIDE 62.5 MCG/INH IN AEPB
1.0000 | INHALATION_SPRAY | Freq: Every day | RESPIRATORY_TRACT | Status: DC
  Filled 2019-02-02: qty 7

## 2019-02-02 MED ORDER — IPRATROPIUM-ALBUTEROL 0.5-2.5 (3) MG/3ML IN SOLN
3.0000 mL | Freq: Three times a day (TID) | RESPIRATORY_TRACT | Status: DC | PRN
  Administered 2019-02-02 – 2019-02-03 (×5): 3 mL via RESPIRATORY_TRACT
  Filled 2019-02-02 (×5): qty 3

## 2019-02-02 MED ORDER — AMLODIPINE BESYLATE 5 MG PO TABS: 5.0000 mg | ORAL_TABLET | Freq: Once | ORAL | Status: DC

## 2019-02-02 NOTE — ED Notes (Signed)
Attempted to call report. Will try again.

## 2019-02-02 NOTE — ED Notes (Signed)
Receiving RN reported needing a lower MEWS score (in the green) before accepting the patient.

## 2019-02-02 NOTE — ED Provider Notes (Addendum)
[facility] DEPT Provider Note   CSN: [id number] Arrival date & time: 02/02/19  [id number]     History   Chief Complaint Chief Complaint  Patient presents with  . Shortness of Breath    HPI Jenna Stephens is a 43 y.o. female with a history of asthma, hypertension, obesity, presented to the emergency department with shortness of breath and wheezing.  The patient reports she has had 3 days of symptoms began Friday.  She describes feeling short of breath with a dry cough.  She states she has been taking her breathing treatments at home for the past several days but they are not helping.  She did not take any breathing treatments this morning.  She denies any fevers or chills.  She does report that her husband had URI type symptoms a week ago but was tested negative for COVID-19.  She reports that approximately a year ago she was intubated in the ICU for asthma exacerbation.  She believes her last steroid course for asthma was earlier this year.  She states she did not take any of her blood pressure medicines or other medications this morning.  She woke up feeling short of breath and came directly to the emergency department.  She smokes about 1 cigarette/day.     HPI  Past Medical History:  Diagnosis Date  . Acute respiratory failure ([place]) 12/29/2017  . Anxiety   . Asthma   . Gestational diabetes   . Hypertension   . Iron deficiency anemia   . Obesity     Patient Active Problem List   Diagnosis Date Noted  . Acute respiratory distress 02/02/2019  . Grief at loss of child 11/06/2018  . 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 Gestational diabetes mellitus (GDM) affecting pregnancy, antepartum 09/05/2018  . GERD (gastroesophageal reflux disease) 06/09/2018  . Severe persistent asthma without complication [id number]  . HTN (hypertension) 12/29/2017  . Obesity 12/29/2017    Past Surgical History:  Procedure Laterality Date  .  TONSILLECTOMY    . TUBAL LIGATION N/A 10/07/2018   Procedure: POST PARTUM TUBAL LIGATION;  Surgeon: Truett Mainland, DO;  Location: [facility] LD ORS;  Service: Gynecology;  Laterality: N/A;     OB History    Gravida  6   Para  2   Term  2   Preterm  0   AB  3   Living  2     SAB  2   TAB  1   Ectopic  0   Multiple      Live Births  2            Home Medications    Prior to Admission medications   Medication Sig Start Date End Date Taking? Authorizing Provider  albuterol (VENTOLIN HFA) 108 (90 Base) MCG/ACT inhaler Inhale 2 puffs into the lungs every 4 (four) hours as needed for wheezing or shortness of breath.   Yes [provider]  amLODipine (NORVASC) 5 MG tablet Take 1 tablet (5 mg total) by mouth daily. 01/28/19  Yes Gildardo Pounds, NP  Blood Pressure Monitor DEVI Please provide patient with insurance approved blood pressure monitor 02/01/19  Yes Gildardo Pounds, NP  carvedilol (COREG) 3.125 MG tablet Take 1 tablet (3.125 mg total) by mouth 2 (two) times daily with a meal. 01/07/19  Yes Gildardo Pounds, NP  escitalopram (LEXAPRO) 20 MG tablet Take 1 tablet (20 mg total) by mouth daily. 01/28/19 04/28/19 Yes Geryl Rankins  W, NP  ferrous sulfate 325 (65 FE) MG tablet Take 1 tablet (325 mg total) by mouth daily with breakfast. 01/07/19 02/06/19 Yes Gildardo Pounds, NP  fluticasone furoate-vilanterol (BREO ELLIPTA) 200-25 MCG/INH AEPB Inhale 1 puff into the lungs daily. 01/27/18  Yes Martyn Ehrich, NP  lansoprazole (PREVACID) 30 MG capsule Take 1 capsule (30 mg total) by mouth daily at 12 noon. Patient taking differently: Take 30 mg by mouth daily as needed (For acid reflux.).  01/07/19  Yes Gildardo Pounds, NP  Phenylephrine-APAP-guaiFENesin (MUCINEX FAST-MAX) [id number] MG/20ML LIQD Take 20 mLs by mouth every 4 (four) hours as needed (For cold symptoms.).   Yes [provider]  Phenylephrine-DM-GG-APAP (VICKS DAYQUIL SEVERE COLD/FLU) 5-10-200-325  MG/15ML LIQD Take 30 mLs by mouth every 4 (four) hours as needed (For cold symptoms.).   Yes [provider]  traZODone (DESYREL) 100 MG tablet Take 1 tablet (100 mg total) by mouth at bedtime as needed for sleep. 01/28/19 04/28/19 Yes Gildardo Pounds, NP  umeclidinium bromide (INCRUSE ELLIPTA) 62.5 MCG/INH AEPB Inhale 1 puff into the lungs daily. 01/07/19  Yes Gildardo Pounds, NP  hydrOXYzine (ATARAX/VISTARIL) 50 MG tablet Take 1 tablet (50 mg total) by mouth 3 (three) times daily as needed for anxiety. 01/07/19   Gildardo Pounds, NP    Family History Family History  Problem Relation Age of Onset  . Diabetes Mother   . Hypertension Mother   . Hypertension Father   . COPD Father     Social History Social History   Tobacco Use  . Smoking status: Former Smoker    Packs/day: 0.25    Types: Cigarettes    Quit date: 01/06/2018    Years since quitting: 1.0  . Smokeless tobacco: Never Used  . Tobacco comment: stopped smoking after this hospital admission  Substance Use Topics  . Alcohol use: Not Currently    Comment: not during pregnancy  . Drug use: Not Currently    Types: Marijuana    Comment: last smoked 09/26/2018     Allergies   Patient has no known allergies.   Review of Systems Review of Systems  Constitutional: Negative for chills and fever.  Eyes: Negative for photophobia and visual disturbance.  Respiratory: Positive for cough, chest tightness and shortness of breath.   Cardiovascular: Negative for chest pain and palpitations.  Gastrointestinal: Negative for abdominal pain, nausea and vomiting.  Musculoskeletal: Negative for arthralgias and back pain.  Skin: Negative for pallor and rash.  Neurological: Positive for light-headedness. Negative for syncope.  Psychiatric/Behavioral: Negative for agitation.  All other systems reviewed and are negative.    Physical Exam Updated Vital Signs BP (!) 208/145   Pulse (!) 108   Temp 98.7 F (37.1 C) (Oral)    Resp (!) 22   Ht 5\' 7"  (1.702 m)   Wt 125.6 kg   LMP 01/30/2019   SpO2 94%   BMI 43.38 kg/m   Physical Exam Vitals signs and nursing note reviewed.  Constitutional:      General: She is not in acute distress.    Appearance: She is well-developed. She is obese.  HENT:     Head: Normocephalic and atraumatic.  Eyes:     Conjunctiva/sclera: Conjunctivae normal.  Neck:     Musculoskeletal: Neck supple.  Cardiovascular:     Rate and Rhythm: Normal rate and regular rhythm.     Pulses: Normal pulses.  Pulmonary:     Effort: Pulmonary effort is normal.  Breath sounds: Wheezing present. No rhonchi or rales.     Comments: 97% on room air Speaking in full sentences Mild accs muscle usage Diffuse bilateral expiratory wheezing Skin:    General: Skin is warm and dry.  Neurological:     Mental Status: She is alert.      ED Treatments / Results  Labs (all labs ordered are listed, but only abnormal results are displayed) Labs Reviewed  BASIC METABOLIC PANEL - Abnormal; Notable for the following components:      Result Value   Potassium 3.3 (*)    Glucose, Bld 109 (*)    Calcium 8.7 (*)    All other components within normal limits  CBC WITH DIFFERENTIAL/PLATELET - Abnormal; Notable for the following components:   RBC 5.32 (*)    Hemoglobin 11.8 (*)    MCV 75.4 (*)    MCH 22.2 (*)    MCHC 29.4 (*)    RDW 15.7 (*)    All other components within normal limits  CBG MONITORING, ED - Abnormal; Notable for the following components:   Glucose-Capillary 194 (*)    All other components within normal limits  SARS CORONAVIRUS 2 (TAT 6-24 HRS)  CBC  CREATININE, SERUM  FERRITIN  FOLATE RBC  VITAMIN B12  IRON AND TIBC  TSH  CBC  BASIC METABOLIC PANEL  MAGNESIUM  PROCALCITONIN  BRAIN NATRIURETIC PEPTIDE    EKG EKG Interpretation  Date/Time:  Monday February 02 2019 08:55:59 EDT Ventricular Rate:  86 PR Interval:    QRS Duration: 96 QT Interval:  397 QTC Calculation:  475 R Axis:   52 Text Interpretation:  Sinus rhythm Baseline wander in lead(s) V2 V6 No STEMI  Confirmed by Octaviano Glow [id number]) on 02/02/2019 9:35:42 AM Also confirmed by Octaviano Glow [phone]), editor Hattie Perch [phone])  on 02/02/2019 1:50:31 PM   Radiology Dg Chest 2 View  Result Date: 02/02/2019 CLINICAL DATA:  Shortness of breath, cough, chest tightness EXAM: CHEST - 2 VIEW COMPARISON:  06/08/2018 FINDINGS: Heart and mediastinal contours are within normal limits. No focal opacities or effusions. No acute bony abnormality. IMPRESSION: No active cardiopulmonary disease. Electronically Signed   By: Rolm Baptise M.D.   On: 02/02/2019 09:22    Procedures .Critical Care Performed by: Wyvonnia Dusky, MD Authorized by: Wyvonnia Dusky, MD   Critical care provider statement:    Critical care time (minutes):  35   Critical care was necessary to treat or prevent imminent or life-threatening deterioration of the following conditions:  Respiratory failure   Critical care was time spent personally by me on the following activities:  Discussions with consultants, evaluation of patient's response to treatment, examination of patient, ordering and performing treatments and interventions, ordering and review of laboratory studies, ordering and review of radiographic studies, pulse oximetry, re-evaluation of patient's condition, obtaining history from patient or surrogate and review of old charts Comments:     Severe asthma exacerbation requiring IV magnesium, multiple rounds of breathing treatments, and clinical reassessments   (including critical care time)  Medications Ordered in ED Medications  ipratropium-albuterol (DUONEB) 0.5-2.5 (3) MG/3ML nebulizer solution 3 mL (3 mLs Nebulization Given 02/02/19 1618)  albuterol (PROVENTIL,VENTOLIN) solution continuous neb (2.5 mg/hr Nebulization New Bag/Given 02/02/19 1657)  amLODipine (NORVASC) tablet 5 mg (5 mg Oral Given 02/02/19 1825)   escitalopram (LEXAPRO) tablet 20 mg (20 mg Oral Given 02/02/19 1825)  hydrOXYzine (ATARAX/VISTARIL) tablet 50 mg (has no administration in time range)  traZODone (DESYREL) tablet 100  mg (has no administration in time range)  pantoprazole (PROTONIX) EC tablet 40 mg (has no administration in time range)  ferrous sulfate tablet 325 mg (has no administration in time range)  fluticasone furoate-vilanterol (BREO ELLIPTA) 200-25 MCG/INH 1 puff (1 puff Inhalation Not Given 02/02/19 1701)  Phenylephrine-APAP-guaiFENesin 10-650-400 MG/20ML LIQD 20 mL (has no administration in time range)  umeclidinium bromide (INCRUSE ELLIPTA) 62.5 MCG/INH 1 puff (1 puff Inhalation Not Given 02/02/19 1701)  heparin injection 5,000 Units (has no administration in time range)  acetaminophen (TYLENOL) tablet 650 mg (has no administration in time range)    Or  acetaminophen (TYLENOL) suppository 650 mg (has no administration in time range)  insulin aspart (novoLOG) injection 0-15 Units (3 Units Subcutaneous Given 02/02/19 1827)  insulin aspart (novoLOG) injection 0-5 Units (has no administration in time range)  methylPREDNISolone sodium succinate (SOLU-MEDROL) 40 mg/mL injection 40 mg (40 mg Intravenous Given 02/02/19 1827)  polyethylene glycol (MIRALAX / GLYCOLAX) packet 17 g (has no administration in time range)  senna-docusate (Senokot-S) tablet 2 tablet (has no administration in time range)  hydrALAZINE (APRESOLINE) injection 10 mg (has no administration in time range)  amLODipine (NORVASC) tablet 5 mg (has no administration in time range)  albuterol (PROVENTIL) (2.5 MG/3ML) 0.083% nebulizer solution 5 mg (5 mg Nebulization Given 02/02/19 1010)  methylPREDNISolone sodium succinate (SOLU-MEDROL) 125 mg/2 mL injection 60 mg (60 mg Intravenous Given 02/02/19 1008)  amLODipine (NORVASC) tablet 5 mg (5 mg Oral Given 02/02/19 1025)  magnesium sulfate IVPB 2 g 50 mL (0 g Intravenous Stopped 02/02/19 1341)  potassium chloride  SA (KLOR-CON) CR tablet 40 mEq (40 mEq Oral Given 02/02/19 1826)     Initial Impression / Assessment and Plan / ED Course  I have reviewed the triage vital signs and the nursing notes.  Pertinent labs & imaging results that were available during my care of the patient were reviewed by me and considered in my medical decision making (see chart for details).  43 year old female with a history of asthma requiring intubation approximately 1 year ago presenting to the emergency department with shortness of breath and wheezing.  This is been ongoing for 3 days and worsening.  She woke up this morning feeling her symptoms were severe enough to come to the hospital.  She did not take any breathing treatments yet today.  She has not taken any of her blood pressure medications today.  She is on Norvasc and Coreg for her blood pressure.  On exam the patient is somewhat hypertensive.  She is satting 95 to 97% on room air.  She is able to speak in full sentences but does have mild accessory muscle usage.  She has diffuse audible wheezing bilaterally.  This consistent with an asthma exacerbation.  We will treat her with duo nebs as well as IV Solu-Medrol.  Monitor her closely if she is to need IV magnesium.  She has had a tubal ligation and therefore states that she is not pregnant.  She is afebrile and has no sign of pneumonia on her chest x-ray.  We will check her for COVID-19 given that her husband had URI symptoms (although she stated he tested negative).  I believe she is overall lower risk for COVID-19, however given her history of respiratory failure, as well as the need for nebulizer treatments, I do believe it is appropriate to order a rapid Covid test at this time.  Jenna Stephens was evaluated in Emergency Department on 02/02/2019 for the symptoms described  in the history of present illness. She was evaluated in the context of the global COVID-19 pandemic, which necessitated consideration that the  patient might be at risk for infection with the SARS-CoV-2 virus that causes COVID-19. Institutional protocols and algorithms that pertain to the evaluation of patients at risk for COVID-19 are in a state of rapid change based on information released by regulatory bodies including the CDC and federal and state organizations. These policies and algorithms were followed during the patient's care in the ED.    Clinical Course as of Feb 01 1837  Mon Feb 02, 2019  1328 After 1 round duoneb, patient with minimal improvement in wheezing.  Will give another 2 rounds, complete IV magnesium, and reassess.  Otherwise stable from respiratory standpoint   [MT]  1609 SARS Coronavirus 2: NEGATIVE [MT]  1612 Still diffuse audible wheezing, will start on continuous nebs and admit to floors.  Do not believe she needs bipap or stepdown at this time.   [MT]  1622 Signout give to hospitalist   [MT]    Clinical Course User Index [MT] Marcelino Campos, Carola Rhine, MD    Final Clinical Impressions(s) / ED Diagnoses   Final diagnoses:  Exacerbation of asthma, unspecified asthma severity, unspecified whether persistent    ED Discharge Orders    None       Manju Kulkarni, Carola Rhine, MD 02/02/19 Bosie Helper    Wyvonnia Dusky, MD 02/16/19 1056

## 2019-02-02 NOTE — H&P (Signed)
History and Physical    [name] [id number] DOB: [birth year] DOA: 02/02/2019  PCP: Jenna Pounds, NP Patient coming from: Home  Chief Complaint: Shortness of breath  HPI: Jenna Stephens is a 43 y.o. female with medical history significant of essential hypertension, morbid obesity, asthma, GERD came to the hospital with complains of shortness of breath.  Patient states for the past 3 days she has had significant and progressive shortness of breath with chest tightness, subjective fevers and chills.  Husband had URI type symptoms and was tested negative for COVID-19.  Tried using bronchodilators at home without any relief therefore came to the ER. In the ER chest x-ray was negative.  Diffuse diminished breath sounds with wheezing therefore started on bronchodilators and steroids.  COVID-19 was negative.  Overall appeared dyspneic but was not using any accessory muscles and saturating greater than 90% at rest.  Given the extent of her symptoms, it was decided to admit her to stepdown unit.  Review of Systems: As per HPI otherwise 10 point review of systems negative.  Review of Systems Otherwise negative except as per HPI, including: General: Denies fever, chills, night sweats or unintended weight loss. Resp: Denies hemoptysis Cardiac: Denies chest pain, palpitations, orthopnea, paroxysmal nocturnal dyspnea. GI: Denies abdominal pain, nausea, vomiting, diarrhea or constipation GU: Denies dysuria, frequency, hesitancy or incontinence MS: Denies muscle aches, joint pain or swelling Neuro: Denies headache, neurologic deficits (focal weakness, numbness, tingling), abnormal gait Psych: Denies anxiety, depression, SI/HI/AVH Skin: Denies new rashes or lesions ID: Denies sick contacts, exotic exposures, travel  Past Medical History:  Diagnosis Date  . Acute respiratory failure ([place]) 12/29/2017  . Anxiety   . Asthma   . Gestational diabetes   . Hypertension   . Iron deficiency  anemia   . Obesity     Past Surgical History:  Procedure Laterality Date  . TONSILLECTOMY    . TUBAL LIGATION N/A 10/07/2018   Procedure: POST PARTUM TUBAL LIGATION;  Surgeon: Truett Mainland, DO;  Location: [facility] LD ORS;  Service: Gynecology;  Laterality: N/A;    SOCIAL HISTORY:  reports that she quit smoking about 12 months ago. Her smoking use included cigarettes. She smoked 0.25 packs per day. She has never used smokeless tobacco. She reports previous alcohol use. She reports previous drug use. Drug: Marijuana.  No Known Allergies  FAMILY HISTORY: Family History  Problem Relation Age of Onset  . Diabetes Mother   . Hypertension Mother   . Hypertension Father   . COPD Father      Prior to Admission medications   Medication Sig Start Date End Date Taking? Authorizing Provider  albuterol (VENTOLIN HFA) 108 (90 Base) MCG/ACT inhaler Inhale 2 puffs into the lungs every 4 (four) hours as needed for wheezing or shortness of breath.   Yes [provider]  amLODipine (NORVASC) 5 MG tablet Take 1 tablet (5 mg total) by mouth daily. 01/28/19  Yes Jenna Pounds, NP  Blood Pressure Monitor DEVI Please provide patient with insurance approved blood pressure monitor 02/01/19  Yes Jenna Pounds, NP  carvedilol (COREG) 3.125 MG tablet Take 1 tablet (3.125 mg total) by mouth 2 (two) times daily with a meal. 01/07/19  Yes Jenna Pounds, NP  escitalopram (LEXAPRO) 20 MG tablet Take 1 tablet (20 mg total) by mouth daily. 01/28/19 04/28/19 Yes Jenna Pounds, NP  ferrous sulfate 325 (65 FE) MG tablet Take 1 tablet (325 mg total) by mouth daily with breakfast. 01/07/19  02/06/19 Yes Jenna Pounds, NP  fluticasone furoate-vilanterol (BREO ELLIPTA) 200-25 MCG/INH AEPB Inhale 1 puff into the lungs daily. 01/27/18  Yes Martyn Ehrich, NP  lansoprazole (PREVACID) 30 MG capsule Take 1 capsule (30 mg total) by mouth daily at 12 noon. Patient taking differently: Take 30 mg by mouth daily as  needed (For acid reflux.).  01/07/19  Yes Jenna Pounds, NP  Phenylephrine-APAP-guaiFENesin (MUCINEX FAST-MAX) [phone] MG/20ML LIQD Take 20 mLs by mouth every 4 (four) hours as needed (For cold symptoms.).   Yes [provider]  Phenylephrine-DM-GG-APAP (VICKS DAYQUIL SEVERE COLD/FLU) 5-10-200-325 MG/15ML LIQD Take 30 mLs by mouth every 4 (four) hours as needed (For cold symptoms.).   Yes [provider]  traZODone (DESYREL) 100 MG tablet Take 1 tablet (100 mg total) by mouth at bedtime as needed for sleep. 01/28/19 04/28/19 Yes Jenna Pounds, NP  umeclidinium bromide (INCRUSE ELLIPTA) 62.5 MCG/INH AEPB Inhale 1 puff into the lungs daily. 01/07/19  Yes Jenna Pounds, NP  hydrOXYzine (ATARAX/VISTARIL) 50 MG tablet Take 1 tablet (50 mg total) by mouth 3 (three) times daily as needed for anxiety. 01/07/19   Jenna Pounds, NP    Physical Exam: Vitals:   02/02/19 1500 02/02/19 1600 02/02/19 1630 02/02/19 1700  BP:   (!) 163/75 (!) 186/129  Pulse: (!) 107 (!) 110 (!) 111 (!) 108  Resp: 18  (!) 26 (!) 22  Temp:      TempSrc:      SpO2: 93% 100% 95% 94%  Weight:      Height:          Constitutional: NAD, calm, comfortable Eyes: PERRL, lids and conjunctivae normal ENMT: Mucous membranes are moist. Posterior pharynx clear of any exudate or lesions.Normal dentition.  Neck: normal, supple, no masses, no thyromegaly Respiratory: Diffuse diminished breath sounds Cardiovascular: Sinus tachycardia, regular rate and rhythm, no murmurs / rubs / gallops. No extremity edema. 2+ pedal pulses. No carotid bruits.  Abdomen: no tenderness, no masses palpated. No hepatosplenomegaly. Bowel sounds positive.  Musculoskeletal: no clubbing / cyanosis. No joint deformity upper and lower extremities. Good ROM, no contractures. Normal muscle tone.  Skin: no rashes, lesions, ulcers. No induration Neurologic: CN 2-12 grossly intact. Sensation intact, DTR normal. Strength 5/5 in all 4.   Psychiatric: Normal judgment and insight. Alert and oriented x 3. Normal mood.     Labs on Admission: I have personally reviewed following labs and imaging studies  CBC: Recent Labs  Lab 02/02/19 1002  WBC 8.2  NEUTROABS 4.8  HGB 11.8*  HCT 40.1  MCV 75.4*  PLT [id number]   Basic Metabolic Panel: Recent Labs  Lab 02/02/19 1002  NA 137  K 3.3*  CL 106  CO2 22  GLUCOSE 109*  BUN 9  CREATININE 0.75  CALCIUM 8.7*   GFR: Estimated Creatinine Clearance: 127.4 mL/min (by C-G formula based on SCr of 0.75 mg/dL). Liver Function Tests: No results for input(s): AST, ALT, ALKPHOS, BILITOT, PROT, ALBUMIN in the last 168 hours. No results for input(s): LIPASE, AMYLASE in the last 168 hours. No results for input(s): AMMONIA in the last 168 hours. Coagulation Profile: No results for input(s): INR, PROTIME in the last 168 hours. Cardiac Enzymes: No results for input(s): CKTOTAL, CKMB, CKMBINDEX, TROPONINI in the last 168 hours. BNP (last 3 results) No results for input(s): PROBNP in the last 8760 hours. HbA1C: No results for input(s): HGBA1C in the last 72 hours. CBG: No results for input(s): GLUCAP in  the last 168 hours. Lipid Profile: No results for input(s): CHOL, HDL, LDLCALC, TRIG, CHOLHDL, LDLDIRECT in the last 72 hours. Thyroid Function Tests: No results for input(s): TSH, T4TOTAL, FREET4, T3FREE, THYROIDAB in the last 72 hours. Anemia Panel: No results for input(s): VITAMINB12, FOLATE, FERRITIN, TIBC, IRON, RETICCTPCT in the last 72 hours. Urine analysis:    Component Value Date/Time   COLORURINE YELLOW 10/03/2018 1045   APPEARANCEUR CLOUDY (A) 10/03/2018 1045   LABSPEC 1.023 10/03/2018 1045   PHURINE 5.0 10/03/2018 1045   GLUCOSEU NEGATIVE 10/03/2018 1045   HGBUR NEGATIVE 10/03/2018 [place] 10/03/2018 1045   KETONESUR NEGATIVE 10/03/2018 1045   PROTEINUR NEGATIVE 10/03/2018 1045   UROBILINOGEN 1.0 09/04/2018 0858   NITRITE NEGATIVE 10/03/2018  1045   LEUKOCYTESUR MODERATE (A) 10/03/2018 1045   Sepsis Labs: !!!!!!!!!!!!!!!!!!!!!!!!!!!!!!!!!!!!!!!!!!!! @LABRCNTIP (procalcitonin:4,lacticidven:4) ) Recent Results (from the past 240 hour(s))  SARS CORONAVIRUS 2 (TAT 6-24 HRS) Nasopharyngeal Nasopharyngeal Swab     Status: None   Collection Time: 02/02/19 10:02 AM   Specimen: Nasopharyngeal Swab  Result Value Ref Range Status   SARS Coronavirus 2 NEGATIVE NEGATIVE Final    Comment: (NOTE) SARS-CoV-2 target nucleic acids are NOT DETECTED. The SARS-CoV-2 RNA is generally detectable in upper and lower respiratory specimens during the acute phase of infection. Negative results do not preclude SARS-CoV-2 infection, do not rule out co-infections with other pathogens, and should not be used as the sole basis for treatment or other patient management decisions. Negative results must be combined with clinical observations, patient history, and epidemiological information. The expected result is Negative. Fact Sheet for Patients: SugarRoll.be Fact Sheet for Healthcare Providers: https://www.woods-mathews.com/ This test is not yet approved or cleared by the [place] FDA and  has been authorized for detection and/or diagnosis of SARS-CoV-2 by FDA under an Emergency Use Authorization (EUA). This EUA will remain  in effect (meaning this test can be used) for the duration of the COVID-19 declaration under Section 56 4(b)(1) of the Act, 21 U.S.C. section 360bbb-3(b)(1), unless the authorization is terminated or revoked sooner. Performed at [facility], [place] [address], [place], [place] [address]      Radiological Exams on Admission: Dg Chest 2 View  Result Date: 02/02/2019 CLINICAL DATA:  Shortness of breath, cough, chest tightness EXAM: CHEST - 2 VIEW COMPARISON:  06/08/2018 FINDINGS: Heart and mediastinal contours are within normal limits. No focal opacities or effusions. No acute  bony abnormality. IMPRESSION: No active cardiopulmonary disease. Electronically Signed   By: Rolm Baptise M.D.   On: 02/02/2019 09:22     All images have been reviewed by me personally.  EKG: Sinus tachycardia  Assessment/Plan Principal Problem:   Acute respiratory distress Active Problems:   HTN (hypertension)   Obesity   Severe persistent asthma without complication   GERD (gastroesophageal reflux disease)    Acute respiratory distress secondary to severe asthma exacerbation -Admit patient to stepdown unit.  Diffusely diminished breath sound - Bronchodilators scheduled and as needed - Solu-Medrol IV, steroid inhalers -Incentive spirometer and flutter valve -Check procalcitonin, BNP -Supplemental oxygen, BiPAP as needed. -At rest saturating greater than 90% -COVID-19-negative  Essential hypertension, uncontrolled Sinus tachycardia -Secondary to continuous breathing treatment.  Will give another dose of Norvasc and IV hydralazine -Hold off on beta-blocker. -Continue daily Norvasc  GERD -PPI  Microcytic anemia -Check anemia panel.  DVT prophylaxis: Subcu heparin Code Status: Full code Family Communication: None Disposition Plan: To be determined Consults called: None Admission status: Observation to  the stepdown unit given significant respiratory distress.   Time Spent: 65 minutes.  >50% of the time was devoted to discussing the patients care, assessment, plan and disposition with other care givers along with counseling the patient about the risks and benefits of treatment.    Anan Dapolito Arsenio Loader MD [facility]  If 7PM-7AM, please contact night-coverage [email or web address]  02/02/2019, 5:40 PM

## 2019-02-02 NOTE — ED Triage Notes (Signed)
Pt complaint of SOB and nonproductive cough worsening since Friday; concern related to "intubated last time this happened." Hx of asthma; unrelieved by neb treatment or inhalers.

## 2019-02-03 ENCOUNTER — Encounter: Payer: Self-pay | Admitting: Nurse Practitioner

## 2019-02-03 DIAGNOSIS — Z8249 Family history of ischemic heart disease and other diseases of the circulatory system: Secondary | ICD-10-CM | POA: Diagnosis not present

## 2019-02-03 DIAGNOSIS — Z825 Family history of asthma and other chronic lower respiratory diseases: Secondary | ICD-10-CM | POA: Diagnosis not present

## 2019-02-03 DIAGNOSIS — J45901 Unspecified asthma with (acute) exacerbation: Secondary | ICD-10-CM | POA: Diagnosis not present

## 2019-02-03 DIAGNOSIS — Z6841 Body Mass Index (BMI) 40.0 and over, adult: Secondary | ICD-10-CM | POA: Diagnosis not present

## 2019-02-03 DIAGNOSIS — D509 Iron deficiency anemia, unspecified: Secondary | ICD-10-CM | POA: Diagnosis present

## 2019-02-03 DIAGNOSIS — F17210 Nicotine dependence, cigarettes, uncomplicated: Secondary | ICD-10-CM | POA: Diagnosis present

## 2019-02-03 DIAGNOSIS — J4551 Severe persistent asthma with (acute) exacerbation: Secondary | ICD-10-CM | POA: Diagnosis present

## 2019-02-03 DIAGNOSIS — R0603 Acute respiratory distress: Secondary | ICD-10-CM | POA: Diagnosis present

## 2019-02-03 DIAGNOSIS — F419 Anxiety disorder, unspecified: Secondary | ICD-10-CM | POA: Diagnosis present

## 2019-02-03 DIAGNOSIS — Z79899 Other long term (current) drug therapy: Secondary | ICD-10-CM | POA: Diagnosis not present

## 2019-02-03 DIAGNOSIS — I10 Essential (primary) hypertension: Secondary | ICD-10-CM | POA: Diagnosis present

## 2019-02-03 DIAGNOSIS — R000 Tachycardia, unspecified: Secondary | ICD-10-CM | POA: Diagnosis present

## 2019-02-03 DIAGNOSIS — K219 Gastro-esophageal reflux disease without esophagitis: Secondary | ICD-10-CM | POA: Diagnosis present

## 2019-02-03 DIAGNOSIS — Z833 Family history of diabetes mellitus: Secondary | ICD-10-CM | POA: Diagnosis not present

## 2019-02-03 DIAGNOSIS — Z20828 Contact with and (suspected) exposure to other viral communicable diseases: Secondary | ICD-10-CM | POA: Diagnosis present

## 2019-02-03 DIAGNOSIS — Z8632 Personal history of gestational diabetes: Secondary | ICD-10-CM | POA: Diagnosis not present

## 2019-02-03 LAB — FOLATE RBC
Folate, Hemolysate: 392 ng/mL
Folate, RBC: 1045 ng/mL (ref >498)
Hematocrit: 37.5 % (ref 34.0–46.6)

## 2019-02-03 LAB — CBG MONITORING, ED
Glucose-Capillary: 146 mg/dL — ABNORMAL HIGH (ref 70–99)
Glucose-Capillary: 153 mg/dL — ABNORMAL HIGH (ref 70–99)
Glucose-Capillary: 130 mg/dL — ABNORMAL HIGH (ref 70–99)

## 2019-02-03 LAB — BASIC METABOLIC PANEL
Anion gap: 14 (ref 5–15)
BUN: 10 mg/dL (ref 6–20)
CO2: 16 mmol/L — ABNORMAL LOW (ref 22–32)
Calcium: 9.2 mg/dL (ref 8.9–10.3)
Chloride: 106 mmol/L (ref 98–111)
Creatinine, Ser: 0.73 mg/dL (ref 0.44–1.00)
GFR calc Af Amer: >60 mL/min (ref >60)
GFR calc non Af Amer: >60 mL/min (ref >60)
Glucose, Bld: 133 mg/dL — ABNORMAL HIGH (ref 70–99)
Potassium: 3.5 mmol/L (ref 3.5–5.1)
Sodium: 136 mmol/L (ref 135–145)

## 2019-02-03 LAB — CBC
HCT: 41.1 % (ref 36.0–46.0)
Hemoglobin: 12.1 g/dL (ref 12.0–15.0)
MCH: 22.5 pg — ABNORMAL LOW (ref 26.0–34.0)
MCHC: 29.4 g/dL — ABNORMAL LOW (ref 30.0–36.0)
MCV: 76.4 fL — ABNORMAL LOW (ref 80.0–100.0)
Platelets: 363 10*3/uL (ref 150–400)
RBC: 5.38 MIL/uL — ABNORMAL HIGH (ref 3.87–5.11)
RDW: 15.9 % — ABNORMAL HIGH (ref 11.5–15.5)
WBC: 13.9 10*3/uL — ABNORMAL HIGH (ref 4.0–10.5)
nRBC: 0.1 % (ref 0.0–0.2)

## 2019-02-03 LAB — MAGNESIUM: Magnesium: 2.5 mg/dL — ABNORMAL HIGH (ref 1.7–2.4)

## 2019-02-03 LAB — BRAIN NATRIURETIC PEPTIDE: B Natriuretic Peptide: 34.2 pg/mL (ref 0.0–100.0)

## 2019-02-03 LAB — GLUCOSE, CAPILLARY: Glucose-Capillary: 236 mg/dL — ABNORMAL HIGH (ref 70–99)

## 2019-02-03 LAB — PROCALCITONIN: Procalcitonin: <0.1 ng/mL

## 2019-02-03 MED ORDER — UMECLIDINIUM BROMIDE 62.5 MCG/INH IN AEPB
1.0000 | INHALATION_SPRAY | Freq: Every day | RESPIRATORY_TRACT | Status: DC
  Administered 2019-02-03: 1 via RESPIRATORY_TRACT
  Filled 2019-02-03 (×2): qty 7

## 2019-02-03 MED ORDER — LABETALOL HCL 5 MG/ML IV SOLN
5.0000 mg | INTRAVENOUS | Status: AC | PRN
  Administered 2019-02-03 (×3): 5 mg via INTRAVENOUS
  Filled 2019-02-03: qty 4

## 2019-02-03 MED ORDER — FERROUS SULFATE 325 (65 FE) MG PO TABS
325.0000 mg | ORAL_TABLET | Freq: Two times a day (BID) | ORAL | Status: DC
  Administered 2019-02-03 (×2): 325 mg via ORAL
  Filled 2019-02-03 (×3): qty 1

## 2019-02-03 MED ORDER — FLUTICASONE FUROATE-VILANTEROL 200-25 MCG/INH IN AEPB
1.0000 | INHALATION_SPRAY | Freq: Every day | RESPIRATORY_TRACT | Status: DC
  Administered 2019-02-03: 1 via RESPIRATORY_TRACT
  Filled 2019-02-03 (×2): qty 28

## 2019-02-03 MED ORDER — GUAIFENESIN 100 MG/5ML PO SOLN: 20.0000 mL | ORAL | Status: DC | PRN

## 2019-02-03 MED ORDER — POTASSIUM CHLORIDE CRYS ER 20 MEQ PO TBCR
40.0000 meq | EXTENDED_RELEASE_TABLET | Freq: Once | ORAL | Status: AC
  Administered 2019-02-03: 40 meq via ORAL
  Filled 2019-02-03: qty 2

## 2019-02-03 NOTE — ED Notes (Signed)
Hospitalist repaged to make aware patient is asking to be discharged home.

## 2019-02-03 NOTE — Progress Notes (Signed)
PROGRESS NOTE    Jenna Stephens  [id number] DOB: 07/06/1975 DOA: 02/02/2019 PCP: Gildardo Pounds, NP   Brief Narrative:  43 y.o. female with medical history significant of essential hypertension, morbid obesity, asthma, GERD came to the hospital with complains of shortness of breath.  Patient states for the past 3 days she has had significant and progressive shortness of breath with chest tightness, subjective fevers and chills.  Husband had URI type symptoms and was tested negative for COVID-19.    Diagnosis of severe asthma exacerbation requiring aggressive treatment with bronchodilators and steroids.   Assessment & Plan:   Principal Problem:   Acute respiratory distress Active Problems:   HTN (hypertension)   Obesity   Severe persistent asthma without complication   GERD (gastroesophageal reflux disease)   Acute respiratory distress secondary to severe asthma exacerbation, minimal improvement -Transferred to MedSurg. - Bronchodilators scheduled and as needed - Continue Solu-Medrol IV, steroid inhalers -Incentive spirometer and flutter valve -Procalcitonin-negative, BNP normal. -Supplemental oxygen, BiPAP as needed. -At rest saturating greater than 90% -COVID-19-negative  Essential hypertension, uncontrolled Sinus tachycardia -Continue daily Norvasc.  Hold off on home beta-blocker.  GERD -PPI  Microcytic anemia, iron deficiency Heavy menstruation -Increase her home iron supplement.  Bowel regimen.   DVT prophylaxis: Subcutaneous heparin Code Status: Full code Family Communication: None Disposition Plan: Still diffusely diminished breath sounds, unsafe for discharge.  Previously she has been intubated secondary to severe asthma exacerbation  Consultants:   None  Procedures:   None  Antimicrobials:   None   Subjective: Feels better compared to yesterday at rest but still has exertional dyspnea.  Review of Systems Otherwise negative except as  per HPI, including: General = no fevers, chills, dizziness, malaise, fatigue HEENT/EYES = negative for pain, redness, loss of vision, double vision, blurred vision, loss of hearing, sore throat, hoarseness, dysphagia Cardiovascular= negative for chest pain, palpitation, murmurs, lower extremity swelling Respiratory/lungs= negative for cough, hemoptysis, wheezing, mucus production Gastrointestinal= negative for nausea, vomiting,, abdominal pain, melena, hematemesis Genitourinary= negative for Dysuria, Hematuria, Change in Urinary Frequency MSK = Negative for arthralgia, myalgias, Back Pain, Joint swelling  Neurology= Negative for headache, seizures, numbness, tingling  Psychiatry= Negative for anxiety, depression, suicidal and homocidal ideation Allergy/Immunology= Medication/Food allergy as listed  Skin= Negative for Rash, lesions, ulcers, itching   Objective: Vitals:   02/03/19 0730 02/03/19 0849 02/03/19 1000 02/03/19 1133  BP: (!) 171/108 (!) 168/98 (!) 168/98   Pulse: 92 98 98   Resp: (!) 26 (!) 22 (!) 22   Temp:      TempSrc:      SpO2: 94% 96% 96% 96%  Weight:      Height:        Intake/Output Summary (Last 24 hours) at 02/03/2019 1148 Last data filed at 02/02/2019 1341 Gross per 24 hour  Intake 45.97 ml  Output -  Net 45.97 ml   Filed Weights   02/02/19 0904  Weight: 125.6 kg    Examination:  Constitutional: NAD, calm, comfortable Eyes: PERRL, lids and conjunctivae normal ENMT: Mucous membranes are moist. Posterior pharynx clear of any exudate or lesions.Normal dentition.  Neck: normal, supple, no masses, no thyromegaly Respiratory: Diffuse diminished breath sounds Cardiovascular: Regular rate and rhythm, no murmurs / rubs / gallops. No extremity edema. 2+ pedal pulses. No carotid bruits.  Abdomen: no tenderness, no masses palpated. No hepatosplenomegaly. Bowel sounds positive.  Musculoskeletal: no clubbing / cyanosis. No joint deformity upper and lower  extremities. Good ROM, no contractures.  Normal muscle tone.  Skin: no rashes, lesions, ulcers. No induration Neurologic: CN 2-12 grossly intact. Sensation intact, DTR normal. Strength 5/5 in all 4.  Psychiatric: Normal judgment and insight. Alert and oriented x 3. Normal mood.     Data Reviewed:   CBC: Recent Labs  Lab 02/02/19 1002 02/02/19 1856 02/03/19 0450  WBC 8.2 10.3 13.9*  NEUTROABS 4.8  --   --   HGB 11.8* 11.8* 12.1  HCT 40.1 39.5 41.1  MCV 75.4* 74.7* 76.4*  PLT 295 333 [id number]   Basic Metabolic Panel: Recent Labs  Lab 02/02/19 1002 02/02/19 1856 02/03/19 0450  NA 137  --  136  K 3.3*  --  3.5  CL 106  --  106  CO2 22  --  16*  GLUCOSE 109*  --  133*  BUN 9  --  10  CREATININE 0.75 0.87 0.73  CALCIUM 8.7*  --  9.2  MG  --   --  2.5*   GFR: Estimated Creatinine Clearance: 127.4 mL/min (by C-G formula based on SCr of 0.73 mg/dL). Liver Function Tests: No results for input(s): AST, ALT, ALKPHOS, BILITOT, PROT, ALBUMIN in the last 168 hours. No results for input(s): LIPASE, AMYLASE in the last 168 hours. No results for input(s): AMMONIA in the last 168 hours. Coagulation Profile: No results for input(s): INR, PROTIME in the last 168 hours. Cardiac Enzymes: No results for input(s): CKTOTAL, CKMB, CKMBINDEX, TROPONINI in the last 168 hours. BNP (last 3 results) No results for input(s): PROBNP in the last 8760 hours. HbA1C: No results for input(s): HGBA1C in the last 72 hours. CBG: Recent Labs  Lab 02/02/19 1739 02/02/19 2204 02/02/19 2309 02/03/19 0756  GLUCAP 194* 207* 196* 146*   Lipid Profile: No results for input(s): CHOL, HDL, LDLCALC, TRIG, CHOLHDL, LDLDIRECT in the last 72 hours. Thyroid Function Tests: Recent Labs    02/02/19 1855  TSH 0.275*   Anemia Panel: Recent Labs    02/02/19 1857  VITAMINB12 400  FERRITIN 11  TIBC 505*  IRON 26*   Sepsis Labs: Recent Labs  Lab 02/03/19 0450  PROCALCITON <0.10    Recent Results (from  the past 240 hour(s))  SARS CORONAVIRUS 2 (TAT 6-24 HRS) Nasopharyngeal Nasopharyngeal Swab     Status: None   Collection Time: 02/02/19 10:02 AM   Specimen: Nasopharyngeal Swab  Result Value Ref Range Status   SARS Coronavirus 2 NEGATIVE NEGATIVE Final    Comment: (NOTE) SARS-CoV-2 target nucleic acids are NOT DETECTED. The SARS-CoV-2 RNA is generally detectable in upper and lower respiratory specimens during the acute phase of infection. Negative results do not preclude SARS-CoV-2 infection, do not rule out co-infections with other pathogens, and should not be used as the sole basis for treatment or other patient management decisions. Negative results must be combined with clinical observations, patient history, and epidemiological information. The expected result is Negative. Fact Sheet for Patients: SugarRoll.be Fact Sheet for Healthcare Providers: https://www.woods-mathews.com/ This test is not yet approved or cleared by the [place] FDA and  has been authorized for detection and/or diagnosis of SARS-CoV-2 by FDA under an Emergency Use Authorization (EUA). This EUA will remain  in effect (meaning this test can be used) for the duration of the COVID-19 declaration under Section 56 4(b)(1) of the Act, 21 U.S.C. section 360bbb-3(b)(1), unless the authorization is terminated or revoked sooner. Performed at [facility], [place] [address], [place], [place] [address]  Radiology Studies: Dg Chest 2 View  Result Date: 02/02/2019 CLINICAL DATA:  Shortness of breath, cough, chest tightness EXAM: CHEST - 2 VIEW COMPARISON:  06/08/2018 FINDINGS: Heart and mediastinal contours are within normal limits. No focal opacities or effusions. No acute bony abnormality. IMPRESSION: No active cardiopulmonary disease. Electronically Signed   By: Rolm Baptise M.D.   On: 02/02/2019 09:22        Scheduled Meds: . amLODipine  5 mg Oral  Daily  . escitalopram  20 mg Oral Daily  . ferrous sulfate  325 mg Oral BID WC  . fluticasone furoate-vilanterol  1 puff Inhalation Daily  . heparin  5,000 Units Subcutaneous Q8H  . insulin aspart  0-15 Units Subcutaneous TID WC  . insulin aspart  0-5 Units Subcutaneous QHS  . methylPREDNISolone (SOLU-MEDROL) injection  40 mg Intravenous Q8H  . pantoprazole  40 mg Oral Daily  . umeclidinium bromide  1 puff Inhalation Daily   Continuous Infusions:   LOS: 0 days   Time spent= 26 mins    Garyn Arlotta Arsenio Loader, MD [facility]  If 7PM-7AM, please contact night-coverage [email or web address] 02/03/2019, 11:48 AM

## 2019-02-03 NOTE — ED Notes (Signed)
Hospitalist messaged on secure chat that patient is requesting to be discharged.

## 2019-02-03 NOTE — ED Notes (Signed)
Pt verbalized to this nurse that her last breathing treatment was today at 4 has has felt fine since. Stating she no longer wants to be admitted and requesting to be discharged. This nurse explained there are still risks to going home but patient still requests to follow up with her PCP tomorrow. This nurse explained that I will make the hospitalist aware.

## 2019-02-03 NOTE — ED Notes (Addendum)
Patient placed on a hospital bed for comfort due to holding in the ED

## 2019-02-04 LAB — CBC
HCT: 41.4 % (ref 36.0–46.0)
Hemoglobin: 12.2 g/dL (ref 12.0–15.0)
MCH: 22.1 pg — ABNORMAL LOW (ref 26.0–34.0)
MCHC: 29.5 g/dL — ABNORMAL LOW (ref 30.0–36.0)
MCV: 75.1 fL — ABNORMAL LOW (ref 80.0–100.0)
Platelets: 413 10*3/uL — ABNORMAL HIGH (ref 150–400)
RBC: 5.51 MIL/uL — ABNORMAL HIGH (ref 3.87–5.11)
RDW: 15.9 % — ABNORMAL HIGH (ref 11.5–15.5)
WBC: 17.5 10*3/uL — ABNORMAL HIGH (ref 4.0–10.5)
nRBC: 0 % (ref 0.0–0.2)

## 2019-02-04 LAB — BASIC METABOLIC PANEL
Anion gap: 14 (ref 5–15)
BUN: 17 mg/dL (ref 6–20)
CO2: 20 mmol/L — ABNORMAL LOW (ref 22–32)
Calcium: 9.1 mg/dL (ref 8.9–10.3)
Chloride: 105 mmol/L (ref 98–111)
Creatinine, Ser: 0.7 mg/dL (ref 0.44–1.00)
GFR calc Af Amer: >60 mL/min (ref >60)
GFR calc non Af Amer: >60 mL/min (ref >60)
Glucose, Bld: 142 mg/dL — ABNORMAL HIGH (ref 70–99)
Potassium: 4.3 mmol/L (ref 3.5–5.1)
Sodium: 139 mmol/L (ref 135–145)

## 2019-02-04 LAB — GLUCOSE, CAPILLARY: Glucose-Capillary: 131 mg/dL — ABNORMAL HIGH (ref 70–99)

## 2019-02-04 LAB — MAGNESIUM: Magnesium: 2.4 mg/dL (ref 1.7–2.4)

## 2019-02-04 MED ORDER — BREO ELLIPTA 200-25 MCG/INH IN AEPB: 1.0000 | INHALATION_SPRAY | Freq: Every day | RESPIRATORY_TRACT | 0 refills | Status: AC

## 2019-02-04 MED ORDER — ALBUTEROL SULFATE HFA 108 (90 BASE) MCG/ACT IN AERS: 2.0000 | INHALATION_SPRAY | RESPIRATORY_TRACT | 0 refills | Status: DC | PRN

## 2019-02-04 MED ORDER — PREDNISONE 20 MG PO TABS: ORAL_TABLET | ORAL | 0 refills | Status: DC

## 2019-02-04 NOTE — Progress Notes (Signed)
Pt discharged home with all belongings. No questions or concerns at time of discharge. Discharge education completed, meds reviewed.

## 2019-02-04 NOTE — Discharge Summary (Signed)
Physician Discharge Summary  Jenna Stephens [id number] DOB: 16-Nov-1975 DOA: 02/02/2019  PCP: Gildardo Pounds, NP  Admit date: 02/02/2019 Discharge date: 02/04/2019  Admitted From: Home Disposition: Home   Recommendations for Outpatient Follow-up:  1. Follow up with PCP in 1 weeks 2. Please obtain BMP/CBC in one week your next doctors visit.  3. Advised to continue using scheduled bronchodilators at home.  Complete prednisone course as prescribed.   Discharge Condition: Stable CODE STATUS: Full Diet recommendation: 2 g salt  Brief/Interim Summary: 43 y.o.femalewith medical history significant ofessential hypertension, morbid obesity, asthma, GERD came to the hospital with complains of shortness of breath. Patient states for the past 3 days she has had significant and progressive shortness of breath with chest tightness, subjective fevers and chills. Husband had URI type symptoms and was tested negative for COVID-19.   Diagnosis of severe asthma exacerbation requiring aggressive treatment with bronchodilators and steroids.  Over the couple of days patient symptoms improved.  Her IV steroids were transitioned to p.o. prednisone.  Ambulatory pulse ox remained greater than 90%.  Stable for discharge today.   Discharge Diagnoses:  Principal Problem:   Acute respiratory distress Active Problems:   HTN (hypertension)   Obesity   Severe persistent asthma without complication   GERD (gastroesophageal reflux disease)  Acute respiratory distress secondary to severe asthma exacerbation, minimal improvement -Significantly improved.  Advised to continue using home bronchodilators.  We will transition IV Solu-Medrol to p.o. prednisone, taper course has been prescribed. - Ambulatory pulse ox remained greater than 90% -COVID-19-negative. -Follow-up outpatient PCP in 1 week.   Essential hypertension, uncontrolled Sinus tachycardia -Seen home  medications  GERD -PPI  Microcytic anemia, iron deficiency Heavy menstruation -Supplements with bowel regimen.  Advised to increase her dosage to 2-3 times daily and have repeat lab works/anemia panel outpatient in about 4 to 6 weeks  Consultations:  None  Subjective: Feels okay.  Ambulating in the hallway without any issues.  Wishes to go home.  Ambulatory pulse ox remains greater than 90%.  Discharge Exam: Vitals:   02/04/19 0938 02/04/19 0942  BP:    Pulse:    Resp:    Temp:    SpO2: 97% 98%   Vitals:   02/04/19 0502 02/04/19 0934 02/04/19 0938 02/04/19 0942  BP: (!) 162/104     Pulse: 91 97    Resp: 17     Temp: (!) 97.5 F (36.4 C)     TempSrc:      SpO2: 90% 97% 97% 98%  Weight: 124.5 kg     Height:        General: Pt is alert, awake, not in acute distress Cardiovascular: RRR, S1/S2 +, no rubs, no gallops Respiratory: CTA bilaterally, no wheezing, no rhonchi Abdominal: Soft, NT, ND, bowel sounds + Extremities: no edema, no cyanosis  Discharge Instructions   Allergies as of 02/04/2019   No Known Allergies     Medication List    TAKE these medications   albuterol 108 (90 Base) MCG/ACT inhaler Commonly known as: VENTOLIN HFA Inhale 2 puffs into the lungs every 4 (four) hours as needed for wheezing or shortness of breath.   amLODipine 5 MG tablet Commonly known as: NORVASC Take 1 tablet (5 mg total) by mouth daily.   Blood Pressure Monitor Devi Please provide patient with insurance approved blood pressure monitor   Breo Ellipta 200-25 MCG/INH Aepb Generic drug: fluticasone furoate-vilanterol Inhale 1 puff into the lungs daily.   carvedilol 3.125 MG tablet  Commonly known as: COREG Take 1 tablet (3.125 mg total) by mouth 2 (two) times daily with a meal.   escitalopram 20 MG tablet Commonly known as: Lexapro Take 1 tablet (20 mg total) by mouth daily.   ferrous sulfate 325 (65 FE) MG tablet Take 1 tablet (325 mg total) by mouth daily with  breakfast.   hydrOXYzine 50 MG tablet Commonly known as: ATARAX/VISTARIL Take 1 tablet (50 mg total) by mouth 3 (three) times daily as needed for anxiety.   Incruse Ellipta 62.5 MCG/INH Aepb Generic drug: umeclidinium bromide Inhale 1 puff into the lungs daily.   lansoprazole 30 MG capsule Commonly known as: PREVACID Take 1 capsule (30 mg total) by mouth daily at 12 noon. What changed:   when to take this  reasons to take this   Mucinex Fast-Max 10-650-400 MG/20ML Liqd Generic drug: Phenylephrine-APAP-guaiFENesin Take 20 mLs by mouth every 4 (four) hours as needed (For cold symptoms.).   predniSONE 20 MG tablet Commonly known as: DELTASONE Take 2 tablets (40 mg total) by mouth 2 (two) times daily with a meal for 2 days, THEN 2 tablets (40 mg total) daily with breakfast for 2 days, THEN 1 tablet (20 mg total) daily with breakfast for 2 days, THEN 0.5 tablets (10 mg total) daily with breakfast for 2 days. Start taking on: February 04, 2019   traZODone 100 MG tablet Commonly known as: DESYREL Take 1 tablet (100 mg total) by mouth at bedtime as needed for sleep.   Vicks DayQuil Severe Cold/Flu 5-10-200-325 MG/15ML Liqd Generic drug: Phenylephrine-DM-GG-APAP Take 30 mLs by mouth every 4 (four) hours as needed (For cold symptoms.).      Follow-up Information    Gildardo Pounds, NP. Schedule an appointment as soon as possible for a visit in 1 week(s).   Specialty: Nurse Practitioner Contact information: [place] [address] [phone]          No Known Allergies  You were cared for by a hospitalist during your hospital stay. If you have any questions about your discharge medications or the care you received while you were in the hospital after you are discharged, you can call the unit and asked to speak with the hospitalist on call if the hospitalist that took care of you is not available. Once you are discharged, your primary care physician will handle  any further medical issues. Please note that no refills for any discharge medications will be authorized once you are discharged, as it is imperative that you return to your primary care physician (or establish a relationship with a primary care physician if you do not have one) for your aftercare needs so that they can reassess your need for medications and monitor your lab values.   Procedures/Studies: Dg Chest 2 View  Result Date: 02/02/2019 CLINICAL DATA:  Shortness of breath, cough, chest tightness EXAM: CHEST - 2 VIEW COMPARISON:  06/08/2018 FINDINGS: Heart and mediastinal contours are within normal limits. No focal opacities or effusions. No acute bony abnormality. IMPRESSION: No active cardiopulmonary disease. Electronically Signed   By: Rolm Baptise M.D.   On: 02/02/2019 09:22      The results of significant diagnostics from this hospitalization (including imaging, microbiology, ancillary and laboratory) are listed below for reference.     Microbiology: Recent Results (from the past 240 hour(s))  SARS CORONAVIRUS 2 (TAT 6-24 HRS) Nasopharyngeal Nasopharyngeal Swab     Status: None   Collection Time: 02/02/19 10:02 AM   Specimen: Nasopharyngeal  Swab  Result Value Ref Range Status   SARS Coronavirus 2 NEGATIVE NEGATIVE Final    Comment: (NOTE) SARS-CoV-2 target nucleic acids are NOT DETECTED. The SARS-CoV-2 RNA is generally detectable in upper and lower respiratory specimens during the acute phase of infection. Negative results do not preclude SARS-CoV-2 infection, do not rule out co-infections with other pathogens, and should not be used as the sole basis for treatment or other patient management decisions. Negative results must be combined with clinical observations, patient history, and epidemiological information. The expected result is Negative. Fact Sheet for Patients: SugarRoll.be Fact Sheet for Healthcare  Providers: https://www.woods-mathews.com/ This test is not yet approved or cleared by the [place] FDA and  has been authorized for detection and/or diagnosis of SARS-CoV-2 by FDA under an Emergency Use Authorization (EUA). This EUA will remain  in effect (meaning this test can be used) for the duration of the COVID-19 declaration under Section 56 4(b)(1) of the Act, 21 U.S.C. section 360bbb-3(b)(1), unless the authorization is terminated or revoked sooner. Performed at [facility], [place] [address] [place],  [address]      Labs: BNP (last 3 results) Recent Labs    02/03/19 0450  BNP [id number]   Basic Metabolic Panel: Recent Labs  Lab 02/02/19 1002 02/02/19 1856 02/03/19 0450 02/04/19 0619  NA 137  --  136 139  K 3.3*  --  3.5 4.3  CL 106  --  106 105  CO2 22  --  16* 20*  GLUCOSE 109*  --  133* 142*  BUN 9  --  10 17  CREATININE 0.75 0.87 0.73 0.70  CALCIUM 8.7*  --  9.2 9.1  MG  --   --  2.5* 2.4   Liver Function Tests: No results for input(s): AST, ALT, ALKPHOS, BILITOT, PROT, ALBUMIN in the last 168 hours. No results for input(s): LIPASE, AMYLASE in the last 168 hours. No results for input(s): AMMONIA in the last 168 hours. CBC: Recent Labs  Lab 02/02/19 1002 02/02/19 1856 02/02/19 1857 02/03/19 0450 02/04/19 0619  WBC 8.2 10.3  --  13.9* 17.5*  NEUTROABS 4.8  --   --   --   --   HGB 11.8* 11.8*  --  12.1 12.2  HCT 40.1 39.5 37.5 41.1 41.4  MCV 75.4* 74.7*  --  76.4* 75.1*  PLT 295 333  --  363 413*   Cardiac Enzymes: No results for input(s): CKTOTAL, CKMB, CKMBINDEX, TROPONINI in the last 168 hours. BNP: Invalid input(s): POCBNP CBG: Recent Labs  Lab 02/03/19 0756 02/03/19 1250 02/03/19 1712 02/03/19 2127 02/04/19 0745  GLUCAP 146* 153* 130* 236* 131*   D-Dimer No results for input(s): DDIMER in the last 72 hours. Hgb A1c No results for input(s): HGBA1C in the last 72 hours. Lipid Profile No results for  input(s): CHOL, HDL, LDLCALC, TRIG, CHOLHDL, LDLDIRECT in the last 72 hours. Thyroid function studies Recent Labs    02/02/19 1855  TSH 0.275*   Anemia work up Recent Labs    02/02/19 1857  VITAMINB12 400  FERRITIN 11  TIBC 505*  IRON 26*   Urinalysis    Component Value Date/Time   COLORURINE YELLOW 10/03/2018 1045   APPEARANCEUR CLOUDY (A) 10/03/2018 1045   LABSPEC 1.023 10/03/2018 1045   PHURINE 5.0 10/03/2018 [place] 10/03/2018 1045   HGBUR NEGATIVE 10/03/2018 [place] 10/03/2018 [place] 10/03/2018 1045   PROTEINUR NEGATIVE 10/03/2018 1045  UROBILINOGEN 1.0 09/04/2018 0858   NITRITE NEGATIVE 10/03/2018 1045   LEUKOCYTESUR MODERATE (A) 10/03/2018 1045   Sepsis Labs Invalid input(s): PROCALCITONIN,  WBC,  LACTICIDVEN Microbiology Recent Results (from the past 240 hour(s))  SARS CORONAVIRUS 2 (TAT 6-24 HRS) Nasopharyngeal Nasopharyngeal Swab     Status: None   Collection Time: 02/02/19 10:02 AM   Specimen: Nasopharyngeal Swab  Result Value Ref Range Status   SARS Coronavirus 2 NEGATIVE NEGATIVE Final    Comment: (NOTE) SARS-CoV-2 target nucleic acids are NOT DETECTED. The SARS-CoV-2 RNA is generally detectable in upper and lower respiratory specimens during the acute phase of infection. Negative results do not preclude SARS-CoV-2 infection, do not rule out co-infections with other pathogens, and should not be used as the sole basis for treatment or other patient management decisions. Negative results must be combined with clinical observations, patient history, and epidemiological information. The expected result is Negative. Fact Sheet for Patients: SugarRoll.be Fact Sheet for Healthcare Providers: https://www.woods-mathews.com/ This test is not yet approved or cleared by the [place] FDA and  has been authorized for detection and/or diagnosis of SARS-CoV-2  by FDA under an Emergency Use Authorization (EUA). This EUA will remain  in effect (meaning this test can be used) for the duration of the COVID-19 declaration under Section 56 4(b)(1) of the Act, 21 U.S.C. section 360bbb-3(b)(1), unless the authorization is terminated or revoked sooner. Performed at [facility], [place] [address] [place], [place] [address]      Time coordinating discharge:  I have spent 35 minutes face to face with the patient and on the ward discussing the patients care, assessment, plan and disposition with other care givers. >50% of the time was devoted counseling the patient about the risks and benefits of treatment/Discharge disposition and coordinating care.   SIGNED:   Damita Lack, MD  [facility] 02/04/2019, 12:24 PM   If 7PM-7AM, please contact night-coverage [email or web address]

## 2019-02-04 NOTE — Progress Notes (Signed)
SATURATION QUALIFICATIONS: (This note is used to comply with regulatory documentation for home oxygen)  Patient Saturations on Room Air at Rest = 98%  Patient Saturations on Room Air while Ambulating = 97%  Patient Saturations on 0 Liters of oxygen while Ambulating = 97%  Please briefly explain why patient needs home oxygen:  N/A, pt does not require home oxygen. Maintained oxygen saturations above 97% while ambulating on room air.

## 2019-02-06 ENCOUNTER — Telehealth: Payer: Self-pay | Admitting: Pulmonary Disease

## 2019-02-06 MED ORDER — IPRATROPIUM-ALBUTEROL 0.5-2.5 (3) MG/3ML IN SOLN: 3.0000 mL | RESPIRATORY_TRACT | 1 refills | Status: AC | PRN

## 2019-02-06 NOTE — Telephone Encounter (Signed)
Spoke with patient. Verified that it was the DuoNeb solution she was requesting. Medication has been sent to pharmacy. Patient stated that she does not have the nebulizer, she just needed the solution.  Nothing further needed at time of call.

## 2019-02-06 NOTE — Telephone Encounter (Signed)
Spoke with pt. She is requesting a refill on albuterol nebulizer solution. This is not on her current medication list. Michela Pitcher she got this from the hospital in the past. I do not see it listed on her "history" medication list either.  Beth - please advise as Dr. Vaughan Browner is not here today. Thanks.

## 2019-02-06 NOTE — Telephone Encounter (Signed)
I am fine sending in albuterol nebulizer, does she need a nebulizer machine? If so please place referral to DME

## 2019-02-12 ENCOUNTER — Other Ambulatory Visit: Payer: Self-pay

## 2019-02-12 ENCOUNTER — Encounter: Payer: Self-pay | Admitting: Pulmonary Disease

## 2019-02-12 ENCOUNTER — Ambulatory Visit (INDEPENDENT_AMBULATORY_CARE_PROVIDER_SITE_OTHER): Payer: Medicaid Other | Admitting: Pulmonary Disease

## 2019-02-12 VITALS — BP 124/80 | HR 90 | Temp 97.2°F | Ht 68.0 in | Wt 280.0 lb

## 2019-02-12 DIAGNOSIS — J4550 Severe persistent asthma, uncomplicated: Secondary | ICD-10-CM | POA: Diagnosis not present

## 2019-02-12 MED ORDER — PREDNISONE 20 MG PO TABS: 40.0000 mg | ORAL_TABLET | Freq: Every day | ORAL | 0 refills | Status: DC

## 2019-02-12 NOTE — Progress Notes (Signed)
Jenna Stephens    [id number]    05-26-1975  Primary Care Physician:Fleming, Vernia Buff, NP  Referring Physician: Gildardo Pounds, NP [address] [place],  [place] [address]  Chief complaint: Follow-up for asthma.  HPI: 43 year old with history of hypertension.  Admitted to [facility] on 12/29/2017 with exacerbation and status asthmaticus in the setting of rhinovirus infection.  She required intubation, paralysis.  Discharged on 9/16  She was admitted for a day in the middle of February for minor exacerbation of asthma.  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 albuterol (VENTOLIN HFA) 108 (90 Base) MCG/ACT inhaler Inhale 2 puffs into the lungs every 4 (four) hours as needed for wheezing or shortness of breath.  Marland Kitchen amLODipine (NORVASC) 5 MG tablet Take 1 tablet (5 mg total) by mouth daily.  . Blood Pressure Monitor DEVI Please provide patient with insurance approved blood pressure monitor  . carvedilol (COREG) 3.125 MG tablet Take 1 tablet (3.125 mg total) by mouth 2 (two) times daily with a meal.  . escitalopram (LEXAPRO) 20 MG tablet Take 1 tablet (20 mg total) by mouth daily.  . fluticasone furoate-vilanterol (BREO ELLIPTA) 200-25 MCG/INH AEPB Inhale 1 puff into the lungs daily.  . hydrOXYzine (ATARAX/VISTARIL) 50 MG tablet Take 1 tablet (50  mg total) by mouth 3 (three) times daily as needed for anxiety.  Marland Kitchen ipratropium-albuterol (DUONEB) 0.5-2.5 (3) MG/3ML SOLN Take 3 mLs by nebulization every 4 (four) hours as needed.  . lansoprazole (PREVACID) 30 MG capsule Take 1 capsule (30 mg total) by mouth daily at 12 noon. (Patient taking differently: Take 30 mg by mouth daily as needed (For acid reflux.). )  . Phenylephrine-APAP-guaiFENesin (MUCINEX FAST-MAX) 10-650-400 MG/20ML LIQD Take 20 mLs by mouth every 4 (four) hours as needed (For cold symptoms.).  Marland Kitchen Phenylephrine-DM-GG-APAP (VICKS DAYQUIL SEVERE COLD/FLU) 5-10-200-325 MG/15ML LIQD Take 30 mLs by mouth every 4 (four) hours as needed (For cold symptoms.).  Marland Kitchen traZODone (DESYREL) 100 MG tablet Take 1 tablet (100 mg total) by mouth at bedtime as needed for sleep.  Marland Kitchen umeclidinium bromide (INCRUSE ELLIPTA) 62.5 MCG/INH AEPB Inhale 1 puff into the lungs daily.  . ferrous sulfate 325 (65 FE) MG tablet Take 1 tablet (325 mg total) by mouth daily with breakfast.  . [DISCONTINUED] predniSONE (DELTASONE) 20 MG tablet Take 2 tablets (40 mg total) by mouth 2 (two) times daily with a meal for 2 days, THEN 2 tablets (40 mg total) daily with breakfast for 2 days, THEN 1 tablet (20 mg total) daily with breakfast for 2 days, THEN 0.5 tablets (10 mg total) daily with breakfast for 2 days.   No facility-administered encounter medications on file as of 02/12/2019.    Physical Exam: Blood pressure 124/80, pulse 90, temperature (!) 97.2 F (36.2 C), temperature source Temporal, height 5\' 8"  (1.727 m), weight 280 lb (127 kg), last menstrual period  01/30/2019, SpO2 99 %. Gen:      No acute distress HEENT:  EOMI, sclera anicteric Neck:     No masses; no thyromegaly Lungs:    Diminished air entry, minimal wheeze CV:         Regular rate and rhythm; no murmurs Abd:      + bowel sounds; soft, non-tender; no palpable masses, no distension Ext:    No edema; adequate peripheral perfusion Skin:      Warm and dry;  no rash Neuro: alert and oriented x 3 Psych: normal mood and affect  Data Reviewed: Imaging: CTA 12/29/17- no pulmonary embolism in the main pulmonary arteries, mild nodular patchy opacities in the left apex.   Chest x-ray 02/02/2019-no active cardiopulmonary disease I have reviewed the images personally.  PFTs: 01/30/2018 FVC 3.22 [101%), FEV1 2.87 [110%), F/F 89, TLC 93%, DLCO 100% Normal test  FENO 01/09/18-5 FENO 03/01/2018-11.  Labs: CBC 12/29/2017-WBC 19.7, eos 9%, absolute eosinophil count 1773 CBC 02/02/2019-WBC 8.2, eos 5%, absolute eosinophil count 410  Blood allergy profile 12/30/2017-IgE 214, RAST panel shows sensitivity to dust mite, cat, dog, tree, grass pollen, mouse urine   ACT score 02/12/2019-6  Assessment:  Severe persistent asthma She has T2 high inflammation with recurrent exacerbations and hospitalization Continue Breo, Incruse Continue singular  Start paperwork for Nucala She still has some wheeze we will give her prednisone for 5 days Schedule with pharmacy for inhaler training and education.  Obesity Discussed that weight loss will lead to significant improvements in her breathing Advised diet and exercise with goal target of reducing 20 to 30 pounds.  GERD Continue Protonix.   On iron supplementation for iron deficiency anemia.    OSA Suspect uncontrolled OSA is playing a part in presentation She is scheduled for split-night sleep study next month   Plan/Recommendations: - Continue Breo, Incruse, singulair - Start paperwork for Nucala - Prednisone 40 mg a day for 5 days - Visit with pharmacy clinic - Sleep study  Marshell Garfinkel MD [facility] 02/12/2019, 9:38 AM  CC: Gildardo Pounds, NP

## 2019-02-12 NOTE — Patient Instructions (Signed)
Sorry not feeling well We will continue the inhalers including Breo, Incruse Continue the Singulair Continue Protonix for acid reflux  We will start paperwork for an injection therapy called Nucala since you have recurrent attacks of asthma Follow-up in 2 to 4 weeks.

## 2019-02-13 ENCOUNTER — Telehealth: Payer: Self-pay | Admitting: Pharmacy Technician

## 2019-02-13 NOTE — Telephone Encounter (Signed)
Please schedule patient appointment with the pharmacy team for inhaler training and education.  Thanks!  11:21 AM Beatriz Chancellor, CPhT

## 2019-02-15 ENCOUNTER — Encounter: Payer: Self-pay | Admitting: Nurse Practitioner

## 2019-02-16 ENCOUNTER — Encounter: Payer: Self-pay | Admitting: Nurse Practitioner

## 2019-02-16 ENCOUNTER — Telehealth: Payer: Self-pay | Admitting: Pulmonary Disease

## 2019-02-16 NOTE — Telephone Encounter (Signed)
Dr. Vaughan Browner please advise on below patient email:     Good afternoon. Dr. Vaughan Browner, when I was in the hospital recently I was told to get an appointment with my pcp before returning to work. I used [employer]mla for my job bec of the days missed. I couldn't get an appointment with my pcp until the 28th. She had no earlier appointments. So I made appointment with you since you are my pulmonary dr. Now in order for me to return to work, I need a letter from my pcp saying I was ok to be released back to work. My pcp is saying she could provide that letter if you say that I'm ok to return to work since I saw you after my hospital stay. Can you please ok me returning to work? I didn't want to miss anymore days.

## 2019-02-17 ENCOUNTER — Telehealth: Payer: Self-pay

## 2019-02-17 ENCOUNTER — Encounter: Payer: Self-pay | Admitting: Nurse Practitioner

## 2019-02-17 NOTE — Telephone Encounter (Signed)
Enrollment forms were faxed to [facility] by Hildred Alamin, CMA last week.  Called [facility] and spoke with AJ. He advised that on the enrollment forms there was no option selected for the delivery system for the medication >> single dose vial, prefilled syring, etc. Advised him that since we are administering the medication in our office it would need to be the single dose vial. They are also missing insurance information for the pt. Pt's insurance cards have been faxed to [phone]. Will await summary of benefits.

## 2019-02-17 NOTE — Telephone Encounter (Signed)
Called patient to do their pre-visit COVID screening.  Call went to voicemail. Unable to do prescreening.  

## 2019-02-18 ENCOUNTER — Ambulatory Visit (INDEPENDENT_AMBULATORY_CARE_PROVIDER_SITE_OTHER): Payer: Medicaid Other | Admitting: Nurse Practitioner

## 2019-02-18 ENCOUNTER — Other Ambulatory Visit: Payer: Self-pay

## 2019-02-18 ENCOUNTER — Encounter: Payer: Self-pay | Admitting: Nurse Practitioner

## 2019-02-18 VITALS — BP 140/91 | HR 116 | Temp 97.3°F | Resp 18 | Wt 280.6 lb

## 2019-02-18 DIAGNOSIS — I10 Essential (primary) hypertension: Secondary | ICD-10-CM

## 2019-02-18 DIAGNOSIS — G4739 Other sleep apnea: Secondary | ICD-10-CM

## 2019-02-18 DIAGNOSIS — J4550 Severe persistent asthma, uncomplicated: Secondary | ICD-10-CM

## 2019-02-18 MED ORDER — PREDNISONE 20 MG PO TABS: 40.0000 mg | ORAL_TABLET | Freq: Every day | ORAL | 0 refills | Status: DC

## 2019-02-18 MED ORDER — MASKS MISC: 3 refills | Status: DC

## 2019-02-18 MED ORDER — LATEX GLOVES LARGE MISC: 3 refills | Status: DC

## 2019-02-18 NOTE — Telephone Encounter (Signed)
Received fax confirmation that the fax went through at 1552 on 02/17/2019.

## 2019-02-18 NOTE — Patient Instructions (Signed)
Asthma Attack Prevention, Adult  Although you may not be able to control the fact that you have asthma, you can take actions to prevent episodes of asthma (asthma attacks). These actions include:  · Creating a written plan for managing and treating your asthma attacks (asthma action plan).  · Monitoring your asthma.  · Avoiding things that can irritate your airways or make your asthma symptoms worse (asthma triggers).  · Taking your medicines as directed.  · Acting quickly if you have signs or symptoms of an asthma attack.  What are some ways to prevent an asthma attack?  Create a plan  Work with your health care provider to create an asthma action plan. This plan should include:  · A list of your asthma triggers and how to avoid them.  · A list of symptoms that you experience during an asthma attack.  · Information about when to take medicine and how much medicine to take.  · Information to help you understand your peak flow measurements.  · Contact information for your health care providers.  · Daily actions that you can take to control asthma.  Monitor your asthma  To monitor your asthma:  · Use your peak flow meter every morning and every evening for 2-3 weeks. Record the results in a journal. A drop in your peak flow numbers on one or more days may mean that you are starting to have an asthma attack, even if you are not having symptoms.  · When you have asthma symptoms, write them down in a journal.    Avoid asthma triggers  Work with your health care provider to find out what your asthma triggers are. This can be done by:  · Being tested for allergies.  · Keeping a journal that notes when asthma attacks occur and what may have contributed to them.  · Asking your health care provider whether other medical conditions make your asthma worse.  Common asthma triggers include:  · Dust.  · Smoke. This includes campfire smoke and secondhand smoke from tobacco products.  · Pet dander.  · Trees, grasses or  pollens.  · Very cold, dry, or humid air.  · Mold.  · Foods that contain high amounts of sulfites.  · Strong smells.  · Engine exhaust and air pollution.  · Aerosol sprays and fumes from household cleaners.  · Household pests and their droppings, including dust mites and cockroaches.  · Certain medicines, including NSAIDs.  Once you have determined your asthma triggers, take steps to avoid them. Depending on your triggers, you may be able to reduce the chance of an asthma attack by:  · Keeping your home clean. Have someone dust and vacuum your home for you 1 or 2 times a week. If possible, have them use a high-efficiency particulate arrestance (HEPA) vacuum.  · Washing your sheets weekly in hot water.  · Using allergy-proof mattress covers and casings on your bed.  · Keeping pets out of your home.  · Taking care of mold and water problems in your home.  · Avoiding areas where people smoke.  · Avoiding using strong perfumes or odor sprays.  · Avoid spending a lot of time outdoors when pollen counts are high and on very windy days.  · Talking with your health care provider before stopping or starting any new medicines.  Medicines  Take over-the-counter and prescription medicines only as told by your health care provider. Many asthma attacks can be prevented by carefully following your   how long it lasts. Take these actions:  Pay attention to your symptoms. If you are coughing, wheezing, or having difficulty breathing, do not wait to see if your symptoms go away on their own. Follow your asthma action plan.  If you have followed your asthma action plan and your symptoms are not improving, call your health care provider or seek  immediate medical care at the nearest hospital. It is important to write down how often you need to use your fast-acting rescue inhaler. You can track how often you use an inhaler in your journal. If you are using your rescue inhaler more often, it may mean that your asthma is not under control. Adjusting your asthma treatment plan may help you to prevent future asthma attacks and help you to gain better control of your condition. How can I prevent an asthma attack when I exercise? Exercise is a common asthma trigger. To prevent asthma attacks during exercise:  Follow advice from your health care provider about whether you should use your fast-acting inhaler before exercising. Many people with asthma experience exercise-induced bronchoconstriction (EIB). This condition often worsens during vigorous exercise in cold, humid, or dry environments. Usually, people with EIB can stay very active by using a fast-acting inhaler before exercising.  Avoid exercising outdoors in very cold or humid weather.  Avoid exercising outdoors when pollen counts are high.  Warm up and cool down when exercising.  Stop exercising right away if asthma symptoms start. Consider taking part in exercises that are less likely to cause asthma symptoms such as:  Indoor swimming.  Biking.  Walking.  Hiking.  Playing football. This information is not intended to replace advice given to you by your health care provider. Make sure you discuss any questions you have with your health care provider. Document Released: 03/28/2009 Document Revised: 03/22/2017 Document Reviewed: 09/24/2015 Elsevier Patient Education  2020 [place], Adult An asthma action plan helps you understand how to manage your asthma and what to do when you have an asthma attack. The action plan is a color-coded plan that lists the symptoms that indicate whether or not your condition is under control and what actions to take.  If  you have symptoms in the green zone, it means you are doing well.  If you have symptoms in the yellow zone, it means you are having problems.  If you have symptoms in the red zone, you need medical care right away. Follow the plan that you and your health care provider develop. Review your plan with your health care provider at each visit. What triggers your asthma? Knowing the things that can trigger an asthma attack or make your asthma symptoms worse is very important. Talk to your health care provider about your asthma triggers and how to avoid them. Record your known asthma triggers here: _______________ What is your personal best peak flow reading? If you use a peak flow meter, determine your personal best reading. Record it here: _______________ Red zone Symptoms in this zone mean that you should get medical help right away. You will likely feel distressed and have symptoms at rest that restrict your activity. You are in the red zone if:  You are breathing hard and quickly.  Your nose opens wide, your ribs show, and your neck muscles become visible when you breathe in.  Your lips, fingers, or toes are a bluish color.  You have trouble speaking in full sentences.  Your peak flow reading is less than  __________ (less than 50% of your personal best).  Your symptoms do not improve within 15-20 minutes after you use your reliever or rescue medicine (bronchodilator). If you have any of these symptoms:  Call your local emergency services (911 in the [place] or go to the nearest emergency room.  Use your reliever or rescue medicine. ? Start a nebulizer treatment or take 2-4 puffs from a metered-dose inhaler with a spacer. ? Repeat this action every 15-20 minutes until help arrives. Yellow zone Symptoms in this zone mean that your condition may be getting worse. You may have symptoms that interfere with exercise, are noticeably worse after exposure to triggers, or are worse at the first  sign of a cold (upper respiratory infection). These may include:  Waking from sleep.  Coughing, especially at night or first thing in the morning.  Mild wheezing.  Chest tightness.  A peak flow reading that is __________ to __________ (50-79% of your personal best). If you have any of these symptoms:  Add the following medicine to the ones that you use daily: ? Reliever or rescue medicine and dosage: _______________ ? Additional medicine and dosage: _______________ Call your health care provider if:  You remain in the yellow zone for __________ hours.  You are using a reliever or rescue medicine more than 2-3 times a week. Green zone This zone means that your asthma is under control. You may not have any symptoms while you are in the green zone. This means that you:  Have no coughing or wheezing, even while you are working or playing.  Sleep through the night.  Are breathing well.  Have a peak flow reading that is above __________ (80% of your personal best or greater). If you are in the green zone, continue to manage your asthma as directed:  Take these medicines every day: ? Controller medicine and dosage: _______________ ? Controller medicine and dosage: _______________ ? Controller medicine and dosage: _______________ ? Controller medicine and dosage: _______________  Before exercise, use this reliever or rescue medicine: _______________ Call your health care provider if you are using a reliever or rescue medicine more than 2-3 times a week. Where to find more information You can find more information about asthma from:  Centers for Disease Control and Prevention: SamedayLab.co.za  American Lung Association: www.lung.org This information is not intended to replace advice given to you by your health care provider. Make sure you discuss any questions you have with your health care provider. Document Released: 02/04/2009 Document Revised: 01/21/2018 Document  Reviewed: 12/19/2016 Elsevier Patient Education  2020 [employer].

## 2019-02-18 NOTE — Progress Notes (Signed)
Assessment & Plan:  [name] was seen today for hypertension.  Diagnoses and all orders for this visit:  Essential Hypertension Continue all antihypertensives as prescribed.  Remember to bring in your blood pressure log with you for your follow up appointment.  DASH/Mediterranean Diets are healthier choices for HTN.    Severe persistent asthma without complication Follow up with Dr. Vaughan Browner next week  Other orders -     Disposable Gloves (LATEX GLOVES LARGE) MISC; Please provide patient 3 boxes of medicaid approved gloves -     Masks MISC; Please provide patient 3 boxes of medicaid approved face masks.    Patient has been counseled on age-appropriate routine health concerns for screening and prevention. These are reviewed and up-to-date. Referrals have been placed accordingly. Immunizations are up-to-date or declined.    Subjective:   Chief Complaint  Patient presents with  . Hypertension   HPI Jenna Stephens 43 y.o. female presents to office today for BP follow up.  She is requesting to return to work. 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 I have instructed her that she can return to work at this time however if Dr. Vaughan Browner feels she is not okay to return to work then she will have to let her employer know that she will need additional time off. She verbalized understanding.    Essential Hypertension  Elevated today despite her endorsement of medication compliance taking amlodipine 5 mg and carvedilol 3.125 mg BID . I have asked her to submit her home readings with her cuff over my chart for review. If remains high will need to increase amlodipine to 10 mg daily. Denies chest pain, palpitations, lightheadedness, dizziness, headaches or BLE edema.  BP Readings from Last 3 Encounters:  02/18/19 (!) 140/91  02/12/19 124/80  02/04/19 (!) 162/104   Asthma Not well controlled. She endorses medication compliance using her duonebs, SABA,  Breo and Incruse Ellipta 1 puff daily.  She is still endorsing shortness of breath with minimal exertion and feeling tight in her chest but states she needs to return to work for financial reasons. Recently finished 5 day prednisone. Using her duonebs several times per day. She is in no visible respiratory distress today however she does have to stop to catch her breath with prolonged conversation here in the office. She has stopped smoking but states her husband smokes in the home excessively. I have instructed her to have her husband smoke outside due to her poorly controlled asthma. Her Sleep study is pending.    Review of Systems  Constitutional: Negative for fever, malaise/fatigue and weight loss.  HENT: Negative.  Negative for nosebleeds.   Eyes: Negative.  Negative for blurred vision, double vision and photophobia.  Respiratory: Positive for shortness of breath. Negative for cough and wheezing.   Cardiovascular: Negative.  Negative for chest pain, palpitations and leg swelling.  Gastrointestinal: Negative.  Negative for heartburn, nausea and vomiting.  Musculoskeletal: Negative.  Negative for myalgias.  Neurological: Negative.  Negative for dizziness, focal weakness, seizures and headaches.  Psychiatric/Behavioral: Positive for depression. Negative for suicidal ideas. The patient is nervous/anxious.     Past Medical History:  Diagnosis Date  . Acute respiratory failure ([place]) 12/29/2017  . Anxiety   . Asthma   . Gestational diabetes   . Hypertension   . Iron deficiency anemia   . Obesity     Past Surgical History:  Procedure Laterality Date  . TONSILLECTOMY    . TUBAL LIGATION N/A 10/07/2018  Procedure: POST PARTUM TUBAL LIGATION;  Surgeon: Truett Mainland, DO;  Location: [facility] LD ORS;  Service: Gynecology;  Laterality: N/A;    Family History  Problem Relation Age of Onset  . Diabetes Mother   . Hypertension Mother   . Hypertension Father   . COPD Father     Social History Reviewed with no changes to be made  today.   Outpatient Medications Prior to Visit  Medication Sig Dispense Refill  . albuterol (VENTOLIN HFA) 108 (90 Base) MCG/ACT inhaler Inhale 2 puffs into the lungs every 4 (four) hours as needed for wheezing or shortness of breath. 1 g 0  . amLODipine (NORVASC) 5 MG tablet Take 1 tablet (5 mg total) by mouth daily. 90 tablet 3  . carvedilol (COREG) 3.125 MG tablet Take 1 tablet (3.125 mg total) by mouth 2 (two) times daily with a meal. 60 tablet 3  . escitalopram (LEXAPRO) 20 MG tablet Take 1 tablet (20 mg total) by mouth daily. 90 tablet 2  . fluticasone furoate-vilanterol (BREO ELLIPTA) 200-25 MCG/INH AEPB Inhale 1 puff into the lungs daily. 30 each 0  . hydrOXYzine (ATARAX/VISTARIL) 50 MG tablet Take 1 tablet (50 mg total) by mouth 3 (three) times daily as needed for anxiety. 60 tablet 1  . ipratropium-albuterol (DUONEB) 0.5-2.5 (3) MG/3ML SOLN Take 3 mLs by nebulization every 4 (four) hours as needed. 360 mL 1  . lansoprazole (PREVACID) 30 MG capsule Take 1 capsule (30 mg total) by mouth daily at 12 noon. (Patient taking differently: Take 30 mg by mouth daily as needed (For acid reflux.). ) 30 capsule 5  . predniSONE (DELTASONE) 20 MG tablet Take 2 tablets (40 mg total) by mouth daily with breakfast. 10 tablet 0  . traZODone (DESYREL) 100 MG tablet Take 1 tablet (100 mg total) by mouth at bedtime as needed for sleep. 90 tablet 0  . umeclidinium bromide (INCRUSE ELLIPTA) 62.5 MCG/INH AEPB Inhale 1 puff into the lungs daily. 30 each 11  . ferrous sulfate 325 (65 FE) MG tablet Take 1 tablet (325 mg total) by mouth daily with breakfast. 30 tablet 0  . Blood Pressure Monitor DEVI Please provide patient with insurance approved blood pressure monitor 1 Device 0  . Phenylephrine-APAP-guaiFENesin (MUCINEX FAST-MAX) 10-650-400 MG/20ML LIQD Take 20 mLs by mouth every 4 (four) hours as needed (For cold symptoms.).    Marland Kitchen Phenylephrine-DM-GG-APAP (VICKS DAYQUIL SEVERE COLD/FLU) 5-10-200-325 MG/15ML LIQD  Take 30 mLs by mouth every 4 (four) hours as needed (For cold symptoms.).     No facility-administered medications prior to visit.     No Known Allergies     Objective:    BP (!) 140/91   Pulse (!) 116   Temp (!) 97.3 F (36.3 C) (Temporal)   Resp 18   Wt 280 lb 9.6 oz (127.3 kg)   LMP 01/30/2019   SpO2 94%   BMI 42.67 kg/m  Wt Readings from Last 3 Encounters:  02/18/19 280 lb 9.6 oz (127.3 kg)  02/12/19 280 lb (127 kg)  02/04/19 274 lb 7.6 oz (124.5 kg)    Physical Exam Vitals signs and nursing note reviewed.  Constitutional:      Appearance: She is well-developed.  HENT:     Head: Normocephalic and atraumatic.  Neck:     Musculoskeletal: Normal range of motion.  Cardiovascular:     Rate and Rhythm: Normal rate and regular rhythm.     Heart sounds: Normal heart sounds. No murmur. No friction rub. No  gallop.   Pulmonary:     Effort: Pulmonary effort is normal. Tachypnea present. No prolonged expiration, respiratory distress or retractions.     Breath sounds: Decreased air movement present. No decreased breath sounds, wheezing, rhonchi or rales.  Chest:     Chest wall: No tenderness.  Abdominal:     General: Bowel sounds are normal.     Palpations: Abdomen is soft.  Musculoskeletal: Normal range of motion.  Skin:    General: Skin is warm and dry.  Neurological:     Mental Status: She is alert and oriented to person, place, and time.     Coordination: Coordination normal.  Psychiatric:        Behavior: Behavior normal. Behavior is cooperative.        Thought Content: Thought content normal.        Judgment: Judgment normal.        Patient has been counseled extensively about nutrition and exercise as well as the importance of adherence with medications and regular follow-up. The patient was given clear instructions to go to ER or return to medical center if symptoms don't improve, worsen or new problems develop. The patient verbalized understanding.    Follow-up: Return in about 3 months (around 05/21/2019).   Gildardo Pounds, FNP-BC [facility] [place], [place]   02/18/2019, 4:22 PM

## 2019-02-19 NOTE — Telephone Encounter (Signed)
Ok to return to work

## 2019-02-19 NOTE — Telephone Encounter (Signed)
Received summary of benefits. Per the summary of benefits, Nucala will be [employer]. I had Kathlee Nations, our billing coordinator review the summary of benefits as well to make sure of this. PA form has been filled out and faxed to [employer] just to be on the safe side. Will await return fax from [place].

## 2019-02-23 NOTE — Telephone Encounter (Signed)
Called [employer] and was informed that Nucala is not covered under the pt's prescription drug plan. Medication will have to be filed under medical plan.  Nucala Order: 100mg  #1 Vial Order Date: 02/23/2019 Expected date of arrival: 02/24/2019 Ordered by: Desmond Dike, [place]  Specialty Pharmacy: [facility] x1 for pt to make appointment and go over our office policy for first injections.

## 2019-02-24 NOTE — Telephone Encounter (Signed)
Nucala Shipment Received: 100mg  #1 vial Medication arrival date: 02/24/2019 Lot #: [id number] Exp date: 02/2022 Received by: Desmond Dike, [place] x2 for pt to make appointment and go over our office policy for first injections.

## 2019-02-26 ENCOUNTER — Encounter: Payer: Self-pay | Admitting: Nurse Practitioner

## 2019-02-26 NOTE — Telephone Encounter (Signed)
Called and spoke to pt - she was at work and will call back to schedule inhaler teach with the pharmacy group -pr

## 2019-02-27 NOTE — Telephone Encounter (Signed)
LMTCB x3 for pt to make appointment and go over our office policy for injections. I have attempted to contact pt several times with no success or call back from pt. Per office protocol, message will be closed.

## 2019-03-03 ENCOUNTER — Encounter: Payer: Self-pay | Admitting: Nurse Practitioner

## 2019-03-03 ENCOUNTER — Inpatient Hospital Stay (HOSPITAL_COMMUNITY): Admission: RE | Admit: 2019-03-03 | Payer: Medicaid Other | Source: Ambulatory Visit

## 2019-03-03 NOTE — Telephone Encounter (Signed)
LMOVM for patient to call back to schedule appt.  ta

## 2019-03-05 NOTE — Telephone Encounter (Signed)
Thanks for letting us know! We can close encounter and follow up with patient at next office visit or if patient reaches out to schedule.

## 2019-03-05 NOTE — Telephone Encounter (Signed)
Called and left message on pt vm to call back to schedule pharmacy appt - 3rd attempt- forwarding back to pharmacy to inform them unable to schedule pt -pr

## 2019-03-06 ENCOUNTER — Ambulatory Visit (HOSPITAL_BASED_OUTPATIENT_CLINIC_OR_DEPARTMENT_OTHER): Payer: Medicaid Other | Admitting: Internal Medicine

## 2019-03-25 ENCOUNTER — Encounter: Payer: Self-pay | Admitting: General Practice

## 2019-05-08 ENCOUNTER — Other Ambulatory Visit: Payer: Self-pay

## 2019-05-08 IMAGING — CR DG CHEST 2V
2 series · 2 of 2 positions shown · non-contrast
Comparison: Portable chest x-ray January 04, 2018

CLINICAL DATA: History of respiratory failure, current smoker. Now
complaining of headache and nausea and vomiting since last night.

EXAM:
CHEST - 2 VIEW

[w chest pa]
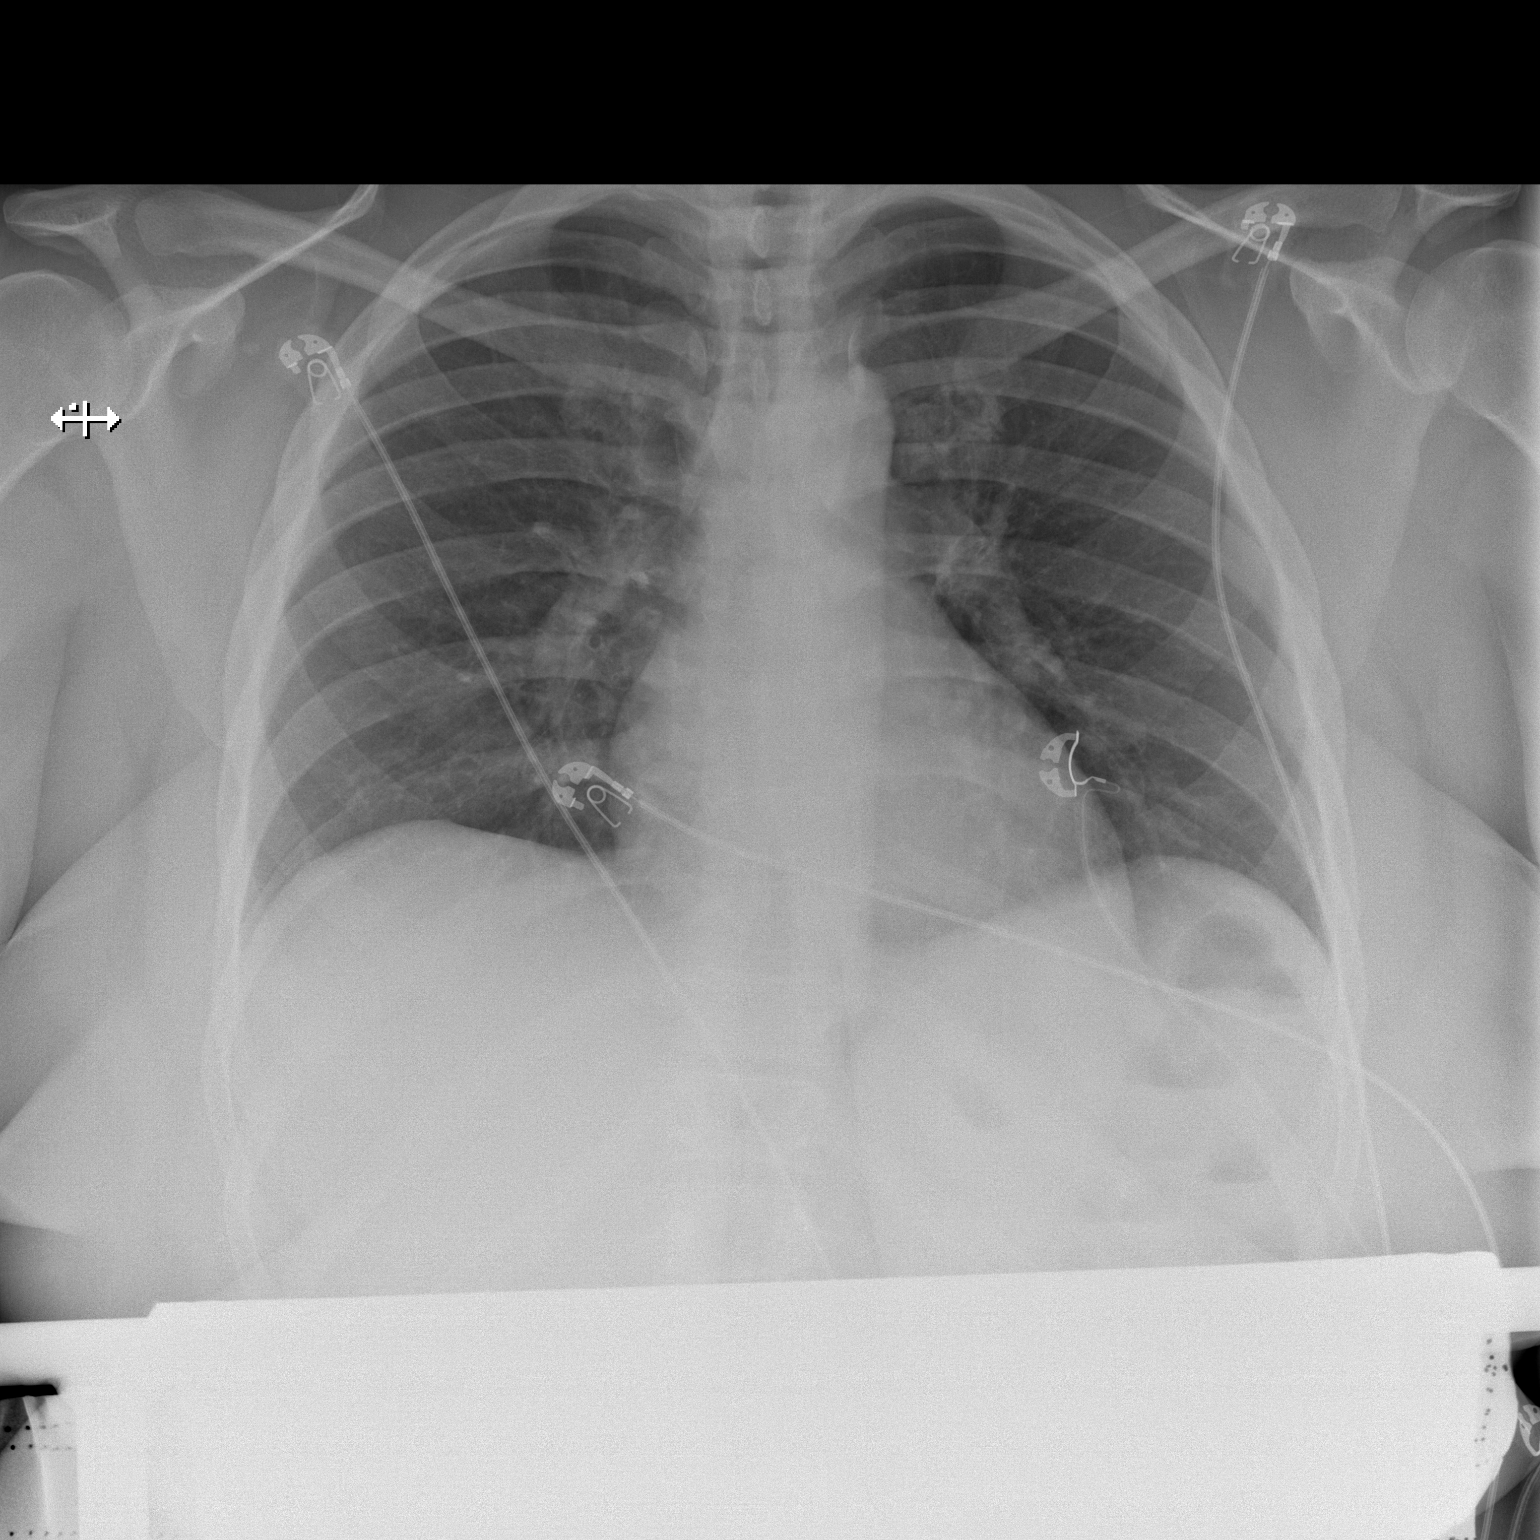

[w chest lat]
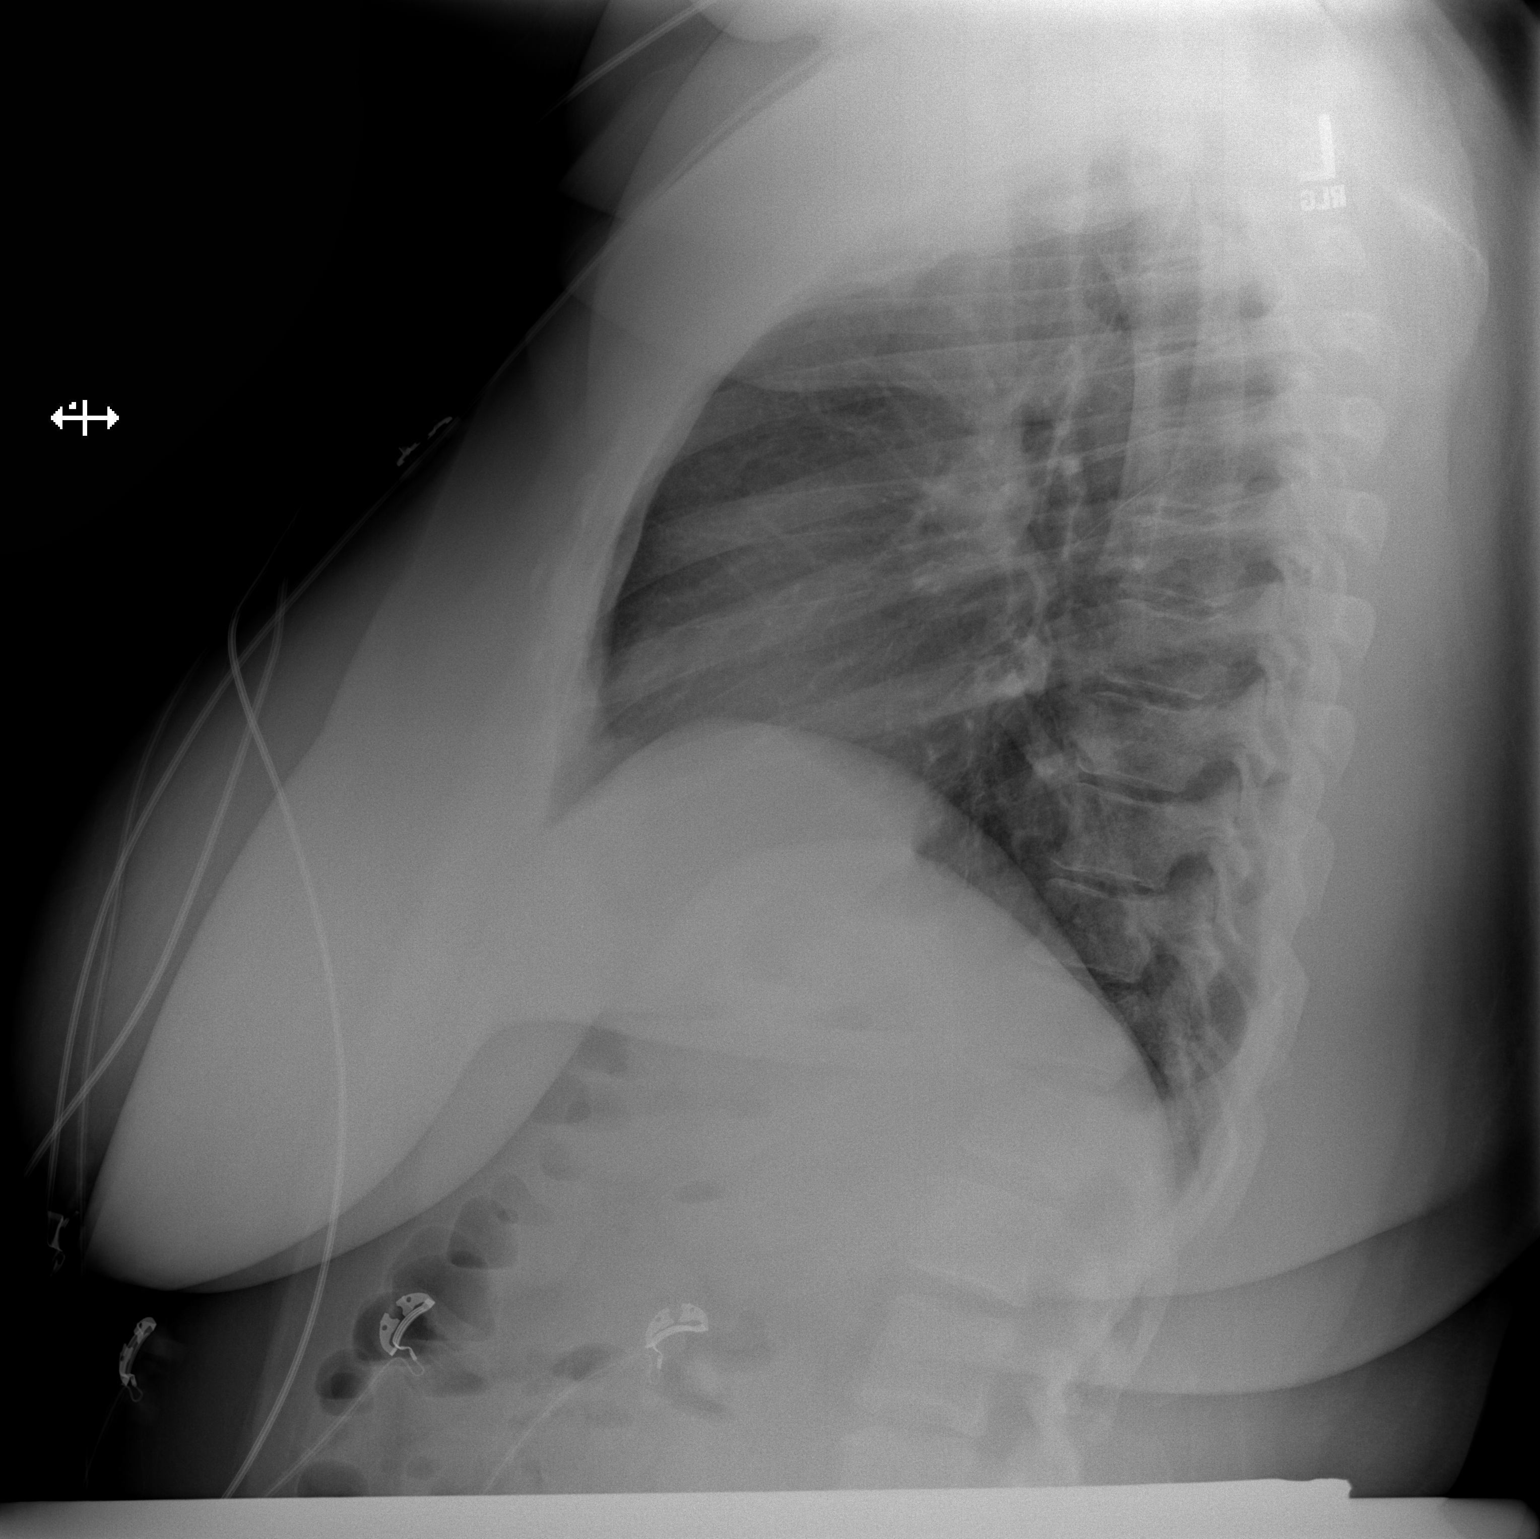

[2 of 2 positions shown; findings below may reference images not displayed]

FINDINGS: The lungs are adequately inflated. There is no focal infiltrate.
There is no pleural effusion. The heart and pulmonary vascularity
are normal. The mediastinum is normal in width. The bony thorax
exhibits no acute abnormality. The bowel gas pattern in the upper
abdomen is within the limits of normal.
IMPRESSION: There is no active cardiopulmonary disease.

## 2019-05-08 MED ORDER — PANTOPRAZOLE SODIUM 40 MG PO TBEC: 40.0000 mg | DELAYED_RELEASE_TABLET | Freq: Every day | ORAL | 1 refills | Status: DC

## 2019-05-12 ENCOUNTER — Telehealth: Payer: Self-pay

## 2019-05-12 NOTE — Telephone Encounter (Signed)
Called patient to do their pre-visit COVID screening.  Patient states that she will have to cancel appointment until she has PAL built up. Will call back to reschedule.

## 2019-05-13 ENCOUNTER — Other Ambulatory Visit: Payer: Self-pay

## 2019-05-13 ENCOUNTER — Ambulatory Visit: Payer: Medicaid Other

## 2019-05-13 DIAGNOSIS — I10 Essential (primary) hypertension: Secondary | ICD-10-CM

## 2019-05-13 DIAGNOSIS — G4700 Insomnia, unspecified: Secondary | ICD-10-CM

## 2019-05-13 MED ORDER — TRAZODONE HCL 100 MG PO TABS: 100.0000 mg | ORAL_TABLET | Freq: Every evening | ORAL | 0 refills | Status: DC | PRN

## 2019-05-13 MED ORDER — CARVEDILOL 3.125 MG PO TABS: 3.1250 mg | ORAL_TABLET | Freq: Two times a day (BID) | ORAL | 0 refills | Status: DC

## 2019-06-19 MED ORDER — ALBUTEROL SULFATE HFA 108 (90 BASE) MCG/ACT IN AERS: 2.0000 | INHALATION_SPRAY | Freq: Four times a day (QID) | RESPIRATORY_TRACT | 0 refills | Status: DC | PRN

## 2019-10-15 ENCOUNTER — Inpatient Hospital Stay (HOSPITAL_COMMUNITY)
Admission: AD | Admit: 2019-10-15 | Discharge: 2019-10-17 | DRG: 202 | Disposition: A | Discharge status: Home / Self Care | Payer: Medicaid Other | Attending: Internal Medicine | Admitting: Internal Medicine

## 2019-10-15 ENCOUNTER — Emergency Department (HOSPITAL_COMMUNITY): Payer: Medicaid Other

## 2019-10-15 ENCOUNTER — Encounter (HOSPITAL_COMMUNITY): Payer: Self-pay | Admitting: Emergency Medicine

## 2019-10-15 ENCOUNTER — Other Ambulatory Visit: Payer: Self-pay

## 2019-10-15 DIAGNOSIS — E669 Obesity, unspecified: Secondary | ICD-10-CM | POA: Diagnosis not present

## 2019-10-15 DIAGNOSIS — R000 Tachycardia, unspecified: Secondary | ICD-10-CM | POA: Diagnosis present

## 2019-10-15 DIAGNOSIS — I10 Essential (primary) hypertension: Secondary | ICD-10-CM | POA: Diagnosis present

## 2019-10-15 DIAGNOSIS — R946 Abnormal results of thyroid function studies: Secondary | ICD-10-CM | POA: Diagnosis present

## 2019-10-15 DIAGNOSIS — T380X5A Adverse effect of glucocorticoids and synthetic analogues, initial encounter: Secondary | ICD-10-CM | POA: Diagnosis present

## 2019-10-15 DIAGNOSIS — Z833 Family history of diabetes mellitus: Secondary | ICD-10-CM | POA: Diagnosis not present

## 2019-10-15 DIAGNOSIS — F419 Anxiety disorder, unspecified: Secondary | ICD-10-CM | POA: Diagnosis present

## 2019-10-15 DIAGNOSIS — Z6838 Body mass index (BMI) 38.0-38.9, adult: Secondary | ICD-10-CM

## 2019-10-15 DIAGNOSIS — Z79899 Other long term (current) drug therapy: Secondary | ICD-10-CM

## 2019-10-15 DIAGNOSIS — I160 Hypertensive urgency: Secondary | ICD-10-CM | POA: Diagnosis present

## 2019-10-15 DIAGNOSIS — Z20822 Contact with and (suspected) exposure to covid-19: Secondary | ICD-10-CM | POA: Diagnosis present

## 2019-10-15 DIAGNOSIS — Z8249 Family history of ischemic heart disease and other diseases of the circulatory system: Secondary | ICD-10-CM | POA: Diagnosis not present

## 2019-10-15 DIAGNOSIS — R0682 Tachypnea, not elsewhere classified: Secondary | ICD-10-CM | POA: Diagnosis present

## 2019-10-15 DIAGNOSIS — D72829 Elevated white blood cell count, unspecified: Secondary | ICD-10-CM | POA: Diagnosis present

## 2019-10-15 DIAGNOSIS — R0602 Shortness of breath: Secondary | ICD-10-CM | POA: Diagnosis present

## 2019-10-15 DIAGNOSIS — Z825 Family history of asthma and other chronic lower respiratory diseases: Secondary | ICD-10-CM | POA: Diagnosis not present

## 2019-10-15 DIAGNOSIS — J4550 Severe persistent asthma, uncomplicated: Secondary | ICD-10-CM | POA: Diagnosis not present

## 2019-10-15 DIAGNOSIS — D509 Iron deficiency anemia, unspecified: Secondary | ICD-10-CM | POA: Diagnosis present

## 2019-10-15 DIAGNOSIS — Z599 Problem related to housing and economic circumstances, unspecified: Secondary | ICD-10-CM | POA: Diagnosis not present

## 2019-10-15 DIAGNOSIS — Z87891 Personal history of nicotine dependence: Secondary | ICD-10-CM | POA: Diagnosis not present

## 2019-10-15 DIAGNOSIS — Z634 Disappearance and death of family member: Secondary | ICD-10-CM

## 2019-10-15 DIAGNOSIS — Z6841 Body Mass Index (BMI) 40.0 and over, adult: Secondary | ICD-10-CM | POA: Diagnosis not present

## 2019-10-15 DIAGNOSIS — J449 Chronic obstructive pulmonary disease, unspecified: Secondary | ICD-10-CM | POA: Diagnosis present

## 2019-10-15 DIAGNOSIS — J4551 Severe persistent asthma with (acute) exacerbation: Principal | ICD-10-CM | POA: Diagnosis present

## 2019-10-15 DIAGNOSIS — G4700 Insomnia, unspecified: Secondary | ICD-10-CM

## 2019-10-15 DIAGNOSIS — J45901 Unspecified asthma with (acute) exacerbation: Secondary | ICD-10-CM | POA: Diagnosis present

## 2019-10-15 DIAGNOSIS — D649 Anemia, unspecified: Secondary | ICD-10-CM

## 2019-10-15 DIAGNOSIS — Z8632 Personal history of gestational diabetes: Secondary | ICD-10-CM

## 2019-10-15 LAB — CBC WITH DIFFERENTIAL/PLATELET
Abs Immature Granulocytes: 0.04 10*3/uL (ref 0.00–0.07)
Basophils Absolute: 0 10*3/uL (ref 0.0–0.1)
Basophils Relative: 0 %
Eosinophils Absolute: 0.2 10*3/uL (ref 0.0–0.5)
Eosinophils Relative: 2 %
HCT: 38.5 % (ref 36.0–46.0)
Hemoglobin: 11.2 g/dL — ABNORMAL LOW (ref 12.0–15.0)
Immature Granulocytes: 0 %
Lymphocytes Relative: 16 %
Lymphs Abs: 2 10*3/uL (ref 0.7–4.0)
MCH: 20 pg — ABNORMAL LOW (ref 26.0–34.0)
MCHC: 29.1 g/dL — ABNORMAL LOW (ref 30.0–36.0)
MCV: 68.9 fL — ABNORMAL LOW (ref 80.0–100.0)
Monocytes Absolute: 0.8 10*3/uL (ref 0.1–1.0)
Monocytes Relative: 7 %
Neutro Abs: 9.1 10*3/uL — ABNORMAL HIGH (ref 1.7–7.7)
Neutrophils Relative %: 75 %
Platelets: 323 10*3/uL (ref 150–400)
RBC: 5.59 MIL/uL — ABNORMAL HIGH (ref 3.87–5.11)
RDW: 18.3 % — ABNORMAL HIGH (ref 11.5–15.5)
WBC: 12.2 10*3/uL — ABNORMAL HIGH (ref 4.0–10.5)
nRBC: 0 % (ref 0.0–0.2)

## 2019-10-15 LAB — I-STAT BETA HCG BLOOD, ED (MC, WL, AP ONLY): I-stat hCG, quantitative: <5 m[IU]/mL (ref <5)

## 2019-10-15 LAB — BASIC METABOLIC PANEL
Anion gap: 11 (ref 5–15)
BUN: 6 mg/dL (ref 6–20)
CO2: 24 mmol/L (ref 22–32)
Calcium: 8.6 mg/dL — ABNORMAL LOW (ref 8.9–10.3)
Chloride: 102 mmol/L (ref 98–111)
Creatinine, Ser: 0.7 mg/dL (ref 0.44–1.00)
GFR calc Af Amer: >60 mL/min (ref >60)
GFR calc non Af Amer: >60 mL/min (ref >60)
Glucose, Bld: 148 mg/dL — ABNORMAL HIGH (ref 70–99)
Potassium: 3.3 mmol/L — ABNORMAL LOW (ref 3.5–5.1)
Sodium: 137 mmol/L (ref 135–145)

## 2019-10-15 LAB — MRSA PCR SCREENING: MRSA by PCR: NEGATIVE

## 2019-10-15 LAB — SARS CORONAVIRUS 2 BY RT PCR (HOSPITAL ORDER, PERFORMED IN ~~LOC~~ HOSPITAL LAB): SARS Coronavirus 2: NEGATIVE

## 2019-10-15 MED ORDER — IPRATROPIUM BROMIDE HFA 17 MCG/ACT IN AERS
2.0000 | INHALATION_SPRAY | Freq: Once | RESPIRATORY_TRACT | Status: AC
  Administered 2019-10-15: 2 via RESPIRATORY_TRACT
  Filled 2019-10-15: qty 12.9

## 2019-10-15 MED ORDER — METHYLPREDNISOLONE SODIUM SUCC 125 MG IJ SOLR
125.0000 mg | Freq: Once | INTRAMUSCULAR | Status: AC
  Administered 2019-10-15: 125 mg via INTRAVENOUS
  Filled 2019-10-15: qty 2

## 2019-10-15 MED ORDER — CHLORHEXIDINE GLUCONATE CLOTH 2 % EX PADS
6.0000 | MEDICATED_PAD | Freq: Every day | CUTANEOUS | Status: DC
  Administered 2019-10-15: 6 via TOPICAL

## 2019-10-15 MED ORDER — TRAZODONE HCL 100 MG PO TABS
100.0000 mg | ORAL_TABLET | Freq: Every evening | ORAL | Status: DC | PRN
  Administered 2019-10-15: 100 mg via ORAL
  Filled 2019-10-15: qty 2

## 2019-10-15 MED ORDER — HEPARIN SODIUM (PORCINE) 5000 UNIT/ML IJ SOLN
5000.0000 [IU] | Freq: Three times a day (TID) | INTRAMUSCULAR | Status: DC
  Administered 2019-10-15 – 2019-10-17 (×5): 5000 [IU] via SUBCUTANEOUS
  Filled 2019-10-15 (×5): qty 1

## 2019-10-15 MED ORDER — MAGNESIUM SULFATE 2 GM/50ML IV SOLN
2.0000 g | Freq: Once | INTRAVENOUS | Status: AC
  Administered 2019-10-15: 2 g via INTRAVENOUS
  Filled 2019-10-15: qty 50

## 2019-10-15 MED ORDER — IPRATROPIUM-ALBUTEROL 0.5-2.5 (3) MG/3ML IN SOLN
3.0000 mL | Freq: Once | RESPIRATORY_TRACT | Status: AC
  Administered 2019-10-15: 3 mL via RESPIRATORY_TRACT
  Filled 2019-10-15: qty 3

## 2019-10-15 MED ORDER — BUDESONIDE 0.5 MG/2ML IN SUSP: 0.5000 mg | Freq: Two times a day (BID) | RESPIRATORY_TRACT | Status: DC

## 2019-10-15 MED ORDER — SENNOSIDES-DOCUSATE SODIUM 8.6-50 MG PO TABS: 1.0000 | ORAL_TABLET | Freq: Every evening | ORAL | Status: DC | PRN

## 2019-10-15 MED ORDER — SODIUM CHLORIDE 0.9 % IV SOLN: INTRAVENOUS | Status: DC

## 2019-10-15 MED ORDER — ONDANSETRON HCL 4 MG PO TABS: 4.0000 mg | ORAL_TABLET | Freq: Four times a day (QID) | ORAL | Status: DC | PRN

## 2019-10-15 MED ORDER — ALBUTEROL (5 MG/ML) CONTINUOUS INHALATION SOLN
7.5000 mg/h | INHALATION_SOLUTION | Freq: Once | RESPIRATORY_TRACT | Status: AC
  Administered 2019-10-15: 7.5 mg/h via RESPIRATORY_TRACT
  Filled 2019-10-15: qty 20

## 2019-10-15 MED ORDER — ACETAMINOPHEN 325 MG PO TABS
650.0000 mg | ORAL_TABLET | Freq: Four times a day (QID) | ORAL | Status: DC | PRN
  Administered 2019-10-15 – 2019-10-16 (×2): 650 mg via ORAL
  Filled 2019-10-15 (×2): qty 2

## 2019-10-15 MED ORDER — METHYLPREDNISOLONE SODIUM SUCC 40 MG IJ SOLR
40.0000 mg | Freq: Two times a day (BID) | INTRAMUSCULAR | Status: DC
  Administered 2019-10-15 – 2019-10-17 (×4): 40 mg via INTRAVENOUS
  Filled 2019-10-15 (×4): qty 1

## 2019-10-15 MED ORDER — GUAIFENESIN ER 600 MG PO TB12
600.0000 mg | ORAL_TABLET | Freq: Two times a day (BID) | ORAL | Status: DC
  Administered 2019-10-15 – 2019-10-17 (×4): 600 mg via ORAL
  Filled 2019-10-15 (×5): qty 1

## 2019-10-15 MED ORDER — CHLORHEXIDINE GLUCONATE 0.12 % MT SOLN
OROMUCOSAL | Status: AC
  Filled 2019-10-15: qty 15

## 2019-10-15 MED ORDER — AMLODIPINE BESYLATE 5 MG PO TABS
5.0000 mg | ORAL_TABLET | Freq: Every day | ORAL | Status: DC
  Administered 2019-10-15: 5 mg via ORAL
  Filled 2019-10-15: qty 1

## 2019-10-15 MED ORDER — BUDESONIDE 0.5 MG/2ML IN SUSP
0.5000 mg | Freq: Two times a day (BID) | RESPIRATORY_TRACT | Status: DC
  Administered 2019-10-15 – 2019-10-16 (×2): 0.5 mg via RESPIRATORY_TRACT
  Filled 2019-10-15 (×2): qty 2

## 2019-10-15 MED ORDER — CARVEDILOL 3.125 MG PO TABS
3.1250 mg | ORAL_TABLET | Freq: Two times a day (BID) | ORAL | Status: DC
  Administered 2019-10-15 – 2019-10-17 (×4): 3.125 mg via ORAL
  Filled 2019-10-15 (×4): qty 1

## 2019-10-15 MED ORDER — IPRATROPIUM-ALBUTEROL 0.5-2.5 (3) MG/3ML IN SOLN
3.0000 mL | RESPIRATORY_TRACT | Status: DC
  Administered 2019-10-15 – 2019-10-16 (×4): 3 mL via RESPIRATORY_TRACT
  Filled 2019-10-15 (×5): qty 3

## 2019-10-15 MED ORDER — ESCITALOPRAM OXALATE 20 MG PO TABS
20.0000 mg | ORAL_TABLET | Freq: Every day | ORAL | Status: DC
  Administered 2019-10-15 – 2019-10-17 (×3): 20 mg via ORAL
  Filled 2019-10-15 (×2): qty 1
  Filled 2019-10-15: qty 2

## 2019-10-15 MED ORDER — AEROCHAMBER PLUS FLO-VU LARGE MISC
1.0000 | Freq: Once | Status: AC
  Administered 2019-10-15: 1
  Filled 2019-10-15: qty 1

## 2019-10-15 MED ORDER — ONDANSETRON HCL 4 MG/2ML IJ SOLN: 4.0000 mg | Freq: Four times a day (QID) | INTRAMUSCULAR | Status: DC | PRN

## 2019-10-15 MED ORDER — ACETAMINOPHEN 650 MG RE SUPP: 650.0000 mg | Freq: Four times a day (QID) | RECTAL | Status: DC | PRN

## 2019-10-15 MED ORDER — FERROUS SULFATE 325 (65 FE) MG PO TABS
325.0000 mg | ORAL_TABLET | Freq: Every day | ORAL | Status: DC
  Administered 2019-10-16 – 2019-10-17 (×2): 325 mg via ORAL
  Filled 2019-10-15 (×2): qty 1

## 2019-10-15 MED ORDER — HYDRALAZINE HCL 20 MG/ML IJ SOLN
5.0000 mg | Freq: Four times a day (QID) | INTRAMUSCULAR | Status: DC | PRN
  Administered 2019-10-16: 10 mg via INTRAVENOUS
  Filled 2019-10-15: qty 1

## 2019-10-15 MED ORDER — ALBUTEROL SULFATE (2.5 MG/3ML) 0.083% IN NEBU: 2.5000 mg | INHALATION_SOLUTION | RESPIRATORY_TRACT | Status: DC | PRN

## 2019-10-15 MED ORDER — AMLODIPINE BESYLATE 10 MG PO TABS
10.0000 mg | ORAL_TABLET | Freq: Every day | ORAL | Status: DC
  Administered 2019-10-16 – 2019-10-17 (×2): 10 mg via ORAL
  Filled 2019-10-15 (×2): qty 1

## 2019-10-15 MED ORDER — ALBUTEROL SULFATE HFA 108 (90 BASE) MCG/ACT IN AERS
6.0000 | INHALATION_SPRAY | Freq: Once | RESPIRATORY_TRACT | Status: AC
  Administered 2019-10-15: 6 via RESPIRATORY_TRACT
  Filled 2019-10-15: qty 6.7

## 2019-10-15 NOTE — ED Triage Notes (Signed)
Pt reports since yesterday SOB and having asthma attack not relieved with inhaler or nebs at home. Pt c/o headache as well

## 2019-10-15 NOTE — ED Notes (Signed)
Report given to Mel Almond, RN for stepdown. There is concern from them about whether patient meets criteria for stepdown and asked that this nurse call admitting MD to ask.  MD paged, awaiting call back.

## 2019-10-15 NOTE — H&P (Signed)
History and Physical  Jenna Stephens QIW:[id number] DOB: Sep 01, 1975 DOA: 10/15/2019  Referring physician: Shelby Dubin, PA PCP: System, Pcp Not In  Outpatient Specialists: Dr. Vaughan Browner (Pulmonology) Patient coming from: Home At her baseline ambulates independently  Chief Complaint: Shortness of breath  HPI: Jenna Stephens is a 44 y.o. female with medical history significant for severe persistent asthma, HTN, obesity who presented on 10/15/2019 with worsening shortness of breath.  Patient states her son had an upper respiratory infection with cough and runny nose for a few days that resolved on its own.  She to had similar symptoms but did notice worsening shortness of breath that started yesterday stated that did not improve despite her use of her home inhaler regimen.  She denied any fevers or chills.  No chest pain.  No nausea or vomiting.  No abdominal pain.  No diarrhea.  Severe persistent asthma.  Previously followed by Dr. Vaughan Browner as outpatient has not seen him since October 2020.  She was last hospitalized for asthma exacerbation treated conservatively on 02/04/2019.  She does have a history of requiring intubation for asthma exacerbation during admission on 12/2017.  She reports adherence to her inhaler regimen, though via chart review she is only received refill for her albuterol.  HTN prescribed amlodipine.  Has not remain adherent due to changing provider.  ED course: Patient was afebrile tachypneic respiratory rate of 20s to 30s, sinus tachycardia with heart rate 112 1216 and blood pressure peak of 172/120. Lab work notable for potassium of 3.3, glucose 148, WBC 12.2, hemoglobin 11.2, MCV 68.9. SARS PCR negative.  Chest x-ray no active disease.  Patient received IV mag, IV fluids, IV Solu-Medrol, and nebulized breathing treatments with Atrovent/DuoNebs.  [facility] service was called for further evaluation and management  Review of Systems:As mentioned in the history of  present illness.Review of systems are otherwise negative Patient seen in the ED .   Past Medical History:  Diagnosis Date  . Acute respiratory failure ([place]) 12/29/2017  . Anxiety   . Asthma   . Gestational diabetes   . Hypertension   . Iron deficiency anemia   . Obesity    Past Surgical History:  Procedure Laterality Date  . TONSILLECTOMY    . TUBAL LIGATION N/A 10/07/2018   Procedure: POST PARTUM TUBAL LIGATION;  Surgeon: Truett Mainland, DO;  Location: [facility] LD [facility]S;  Service: Gynecology;  Laterality: N/A;   No Known Allergies Social History:  reports that she quit smoking about 21 months ago. Her smoking use included cigarettes. She smoked 0.25 packs per day. She has never used smokeless tobacco. She reports previous alcohol use. She reports previous drug use. Drug: Marijuana. Family History  Problem Relation Age of Onset  . Diabetes Mother   . Hypertension Mother   . Hypertension Father   . COPD Father       Prior to Admission medications   Medication Sig Start Date End Date Taking? Authorizing Provider  albuterol (VENTOLIN HFA) 108 (90 Base) MCG/ACT inhaler Inhale 2 puffs into the lungs every 6 (six) hours as needed for wheezing or shortness of breath. 06/19/19  Yes Mannam, Praveen, MD  brompheniramine-pseudoephedrine-DM 30-2-10 MG/5ML syrup Take 1.25 mLs by mouth 4 (four) times daily as needed (cough).   Yes [provider]  ipratropium-albuterol (DUONEB) 0.5-2.5 (3) MG/3ML SOLN Take 3 mLs by nebulization every 4 (four) hours as needed. 02/06/19  Yes Martyn Ehrich, NP  amLODipine (NORVASC) 5 MG tablet Take 1 tablet (5 mg  total) by mouth daily. Patient not taking: Reported on 10/15/2019 01/28/19   Gildardo Pounds, NP  carvedilol (COREG) 3.125 MG tablet Take 1 tablet (3.125 mg total) by mouth 2 (two) times daily with a meal. Patient not taking: Reported on 10/15/2019 05/13/19   Gildardo Pounds, NP  Disposable Gloves ([place]) [employer] Please provide patient 3  boxes of medicaid approved gloves 02/18/19   Gildardo Pounds, NP  escitalopram (LEXAPRO) 20 MG tablet Take 1 tablet (20 mg total) by mouth daily. 01/28/19 04/28/19  Gildardo Pounds, NP  ferrous sulfate 325 (65 FE) MG tablet Take 1 tablet (325 mg total) by mouth daily with breakfast. 01/07/19 02/06/19  Gildardo Pounds, NP  hydrOXYzine (ATARAX/VISTARIL) 50 MG tablet Take 1 tablet (50 mg total) by mouth 3 (three) times daily as needed for anxiety. Patient not taking: Reported on 10/15/2019 01/07/19   Gildardo Pounds, NP  lansoprazole (PREVACID) 30 MG capsule Take 1 capsule (30 mg total) by mouth daily at 12 noon. Patient not taking: Reported on 10/15/2019 01/07/19   Gildardo Pounds, NP  Masks MISC Please provide patient 3 boxes of medicaid approved face masks. 02/18/19   Gildardo Pounds, NP  pantoprazole (PROTONIX) 40 MG tablet Take 1 tablet (40 mg total) by mouth daily. Please schedule a yearly follow up for further refills: [phone] Patient not taking: Reported on 10/15/2019 05/08/19   Yetta Flock, MD  traZODone (DESYREL) 100 MG tablet Take 1 tablet (100 mg total) by mouth at bedtime as needed for sleep. 05/13/19 08/11/19  Gildardo Pounds, NP  umeclidinium bromide (INCRUSE ELLIPTA) 62.5 MCG/INH AEPB Inhale 1 puff into the lungs daily. Patient not taking: Reported on 10/15/2019 01/07/19   Gildardo Pounds, NP    Physical Exam: BP (!) 157/84   Pulse (!) 114   Temp (!) 97.5 F (36.4 C) (Oral)   Resp (!) 28   Ht 5\' 7"  (1.702 m)   Wt 127.4 kg   LMP 10/09/2019 (Exact Date)   SpO2 97%   BMI 43.99 kg/m   Constitutional Young female, no acute distress Eyes: EOMI, anicteric, normal conjunctivae ENMT: Oropharynx with moist mucous membranes, normal dentition Cardiovascular: Tachycardic, with no peripheral edema Respiratory: Increased work of breathing, conversational dyspnea present, diffuse wheezing in all lung fields, no accessory muscle usage Abdomen: Soft,non-tender, with no HSM Skin:  No rash ulcers, or lesions. Without skin tenting  Neurologic: Grossly no focal neuro deficit. Psychiatric:Appropriate affect, and mood. Mental status AAOx3          Labs on Admission:  Basic Metabolic Panel: Recent Labs  Lab 10/15/19 0942  NA 137  K 3.3*  CL 102  CO2 24  GLUCOSE 148*  BUN 6  CREATININE 0.70  CALCIUM 8.6*   Liver Function Tests: No results for input(s): AST, ALT, ALKPHOS, BILITOT, PROT, ALBUMIN in the last 168 hours. No results for input(s): LIPASE, AMYLASE in the last 168 hours. No results for input(s): AMMONIA in the last 168 hours. CBC: Recent Labs  Lab 10/15/19 0942  WBC 12.2*  NEUTROABS 9.1*  HGB 11.2*  HCT 38.5  MCV 68.9*  PLT 323   Cardiac Enzymes: No results for input(s): CKTOTAL, CKMB, CKMBINDEX, TROPONINI in the last 168 hours.  BNP (last 3 results) Recent Labs    02/03/19 0450  BNP 34.2    ProBNP (last 3 results) No results for input(s): PROBNP in the last 8760 hours.  CBG: No results for input(s): GLUCAP in the  last 168 hours.  Radiological Exams on Admission: DG Chest Port 1 View  Result Date: 10/15/2019 CLINICAL DATA:  Shortness of breath, question of asthma EXAM: PORTABLE CHEST 1 VIEW COMPARISON:  02/02/2019 FINDINGS: Cardiomediastinal contours and hilar structures are normal. Lungs are clear. No sign of pleural effusion. Visualized skeletal structures on limited assessment are unremarkable. IMPRESSION: No active disease. Electronically Signed   By: Zetta Bills M.D.   On: 10/15/2019 09:37    EKG: Independently reviewed. Normal sinus rhythm.  Assessment/Plan Present on Admission: . Asthma exacerbation . Severe persistent asthma without complication . HTN (hypertension) . Obesity . Hypertensive urgency . Sinus tachycardia . Microcytic anemia  Active Problems:   HTN (hypertension)   Obesity   Severe persistent asthma without complication   Asthma exacerbation   Hypertensive urgency   Sinus tachycardia    Microcytic anemia     Asthma exacerbation with severe persistent asthma.   Noted conversational dyspnea and increased work of breathing without respiratory distress or accessory muscle usage.  SPO2 ranging from 90 to 92% on room air while at rest but dropped to 89% with minimal exertion in bed.  Patient will require close monitoring with nebulized inhaler therapy given previous history of required intubation and high risk for worsening clinical status given above symptoms -Continue IV Solu-Medrol -Continue scheduled DuoNebs -Add Pulmicort twice daily -Spirometry, Mucinex scheduled -Low threshold for BiPAP, or intubation if needed -If no improvement in 24 hours may consider PCCM consult  Severe persistent asthma. Has not follow-up with her outpatient pulmonologist Dr. Vaughan Browner since October 2020.  Has received refill for albuterol, unclear if patient is actually been taking Breo and Incruse and Singulair as well -Encourage close follow-up with her outpatient pulmonologist -Social work consult to ensure affordability of inhaler regimen once close to discharge  Hypertensive urgency  SBP's 170s and DBP in 120s.  Patient has not been taking amlodipine or carvedilol -Increase amlodipine to 10 mg -Resume home Coreg 3.125 twice daily -IV hydralazine as needed  Sinus tachycardia.   Most likely related to response to beta agonist for asthma exacerbation -Continue to monitor  Leukocytosis, suspect stress related to asthma exacerbation.   Remains afebrile, chest x-ray unremarkable, no localizing signs or symptoms of infection -Repeat CBC in a.m.  Chronic microcytic anemia secondary to iron deficiency.   Hemoglobin stable at baseline no signs or symptoms of acute bleeding -Continue home iron    DVT prophylaxis: Heparin  Code Status: Full  Family Communication: No family at bedside  Disposition Plan: We will require greater than 2 midnight stay given significant conversational dyspnea  and increased work of breathing despite scheduled nebulized treatments with magnesium and IV steroids in the ED and high risk given previous history of intubation in the setting of asthma exacerbation, needs close monitoring respiratory status  Consults called: None  Admission status: Admitted as inpatient to step-down unit.      Desiree Hane MD [facility]  Pager [phone]  If 7PM-7AM, please contact night-coverage [email or web address] Password [facility]  10/15/2019, 7:19 PM

## 2019-10-15 NOTE — ED Notes (Signed)
RT called regarding treatment

## 2019-10-15 NOTE — ED Provider Notes (Signed)
Jenna Stephens Provider Note   CSN: [id number] Arrival date & time: 10/15/19  0947    History Chief Complaint  Patient presents with  . Asthma  . Shortness of Breath    Jenna Stephens is a 44 y.o. female with past medical history significant for asthma, hypertension, anemia who presents for evaluation of shortness of breath.  Patient states shortness of breath consistent with her prior asthma exacerbations.  Has been using home nebs and albuterol without relief.  Has had nonproductive cough over the last 2 days.  No Covid exposures did not obtain Covid vaccine.  Patient states she does have a headache which she relates to her cough.  States when she coughs frequently she frequently gets headache.  No sudden onset thunderclap headache.  No lightheadedness, dizziness, vision changes, facial droop, paresthesias, weakness.  Denies congestion, rhinorrhea, sore throat, chest pain abdominal pain, diarrhea, dysuria.  No prior history of PE or DVT.  No unilateral leg swelling, redness, warmth, malignancy, recent surgery, immobilization.  No additional aggravating or alleviating factors.  States she feels "wheezy."  History obtained from patient and past medical records.  No interpreter is used.  Has been admitted to ICU and intubated for previous exacerbations.  HPI     Past Medical History:  Diagnosis Date  . Acute respiratory failure ([place]) 12/29/2017  . Anxiety   . Asthma   . Gestational diabetes   . Hypertension   . Iron deficiency anemia   . Obesity     Patient Active Problem List   Diagnosis Date Noted  . Asthma exacerbation 10/15/2019  . Acute respiratory distress 02/02/2019  . Grief at loss of child 11/06/2018  . 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 Gestational diabetes mellitus (GDM) affecting pregnancy, antepartum 09/05/2018  . GERD (gastroesophageal reflux disease) 06/09/2018  . Severe persistent asthma without complication  09/62/8366  . HTN (hypertension) 12/29/2017  . Obesity 12/29/2017    Past Surgical History:  Procedure Laterality Date  . TONSILLECTOMY    . TUBAL LIGATION N/A 10/07/2018   Procedure: POST PARTUM TUBAL LIGATION;  Surgeon: Truett Mainland, DO;  Location: [facility] LD ORS;  Service: Gynecology;  Laterality: N/A;     OB History    Gravida  6   Para  2   Term  2   Preterm  0   AB  3   Living  2     SAB  2   TAB  1   Ectopic  0   Multiple      Live Births  2           Family History  Problem Relation Age of Onset  . Diabetes Mother   . Hypertension Mother   . Hypertension Father   . COPD Father     Social History   Tobacco Use  . Smoking status: Former Smoker    Packs/day: 0.25    Types: Cigarettes    Quit date: 01/06/2018    Years since quitting: 1.7  . Smokeless tobacco: Never Used  . Tobacco comment: stopped smoking after this hospital admission  Vaping Use  . Vaping Use: Never used  Substance Use Topics  . Alcohol use: Not Currently    Comment: not during pregnancy  . Drug use: Not Currently    Types: Marijuana    Comment: last smoked 09/26/2018    Home Medications Prior to Admission medications   Medication Sig Start Date End Date Taking? Authorizing Provider  albuterol (VENTOLIN HFA) 108 (90 Base) MCG/ACT inhaler Inhale 2 puffs into the lungs every 6 (six) hours as needed for wheezing or shortness of breath. 06/19/19  Yes Mannam, Praveen, MD  brompheniramine-pseudoephedrine-DM 30-2-10 MG/5ML syrup Take 1.25 mLs by mouth 4 (four) times daily as needed (cough).   Yes [provider]  ipratropium-albuterol (DUONEB) 0.5-2.5 (3) MG/3ML SOLN Take 3 mLs by nebulization every 4 (four) hours as needed. 02/06/19  Yes Martyn Ehrich, NP  amLODipine (NORVASC) 5 MG tablet Take 1 tablet (5 mg total) by mouth daily. Patient not taking: Reported on 10/15/2019 01/28/19   Gildardo Pounds, NP  carvedilol (COREG) 3.125 MG tablet Take 1 tablet (3.125 mg  total) by mouth 2 (two) times daily with a meal. Patient not taking: Reported on 10/15/2019 05/13/19   Gildardo Pounds, NP  Disposable Gloves ([place]) [place] Please provide patient 3 boxes of medicaid approved gloves 02/18/19   Gildardo Pounds, NP  escitalopram (LEXAPRO) 20 MG tablet Take 1 tablet (20 mg total) by mouth daily. 01/28/19 04/28/19  Gildardo Pounds, NP  ferrous sulfate 325 (65 FE) MG tablet Take 1 tablet (325 mg total) by mouth daily with breakfast. 01/07/19 02/06/19  Gildardo Pounds, NP  hydrOXYzine (ATARAX/VISTARIL) 50 MG tablet Take 1 tablet (50 mg total) by mouth 3 (three) times daily as needed for anxiety. Patient not taking: Reported on 10/15/2019 01/07/19   Gildardo Pounds, NP  lansoprazole (PREVACID) 30 MG capsule Take 1 capsule (30 mg total) by mouth daily at 12 noon. Patient not taking: Reported on 10/15/2019 01/07/19   Gildardo Pounds, NP  Masks MISC Please provide patient 3 boxes of medicaid approved face masks. 02/18/19   Gildardo Pounds, NP  pantoprazole (PROTONIX) 40 MG tablet Take 1 tablet (40 mg total) by mouth daily. Please schedule a yearly follow up for further refills: [phone] Patient not taking: Reported on 10/15/2019 05/08/19   Yetta Flock, MD  traZODone (DESYREL) 100 MG tablet Take 1 tablet (100 mg total) by mouth at bedtime as needed for sleep. 05/13/19 08/11/19  Gildardo Pounds, NP  umeclidinium bromide (INCRUSE ELLIPTA) 62.5 MCG/INH AEPB Inhale 1 puff into the lungs daily. Patient not taking: Reported on 10/15/2019 01/07/19   Gildardo Pounds, NP    Allergies    Patient has no known allergies.  Review of Systems   Review of Systems  Constitutional: Negative.   HENT: Negative.   Respiratory: Positive for cough, chest tightness, shortness of breath and wheezing.   Cardiovascular: Negative.   Gastrointestinal: Negative.   Genitourinary: Negative.   Musculoskeletal: Negative.   Skin: Negative.   Neurological: Positive for headaches.  Negative for dizziness, tremors, seizures, syncope, facial asymmetry, speech difficulty, weakness, light-headedness and numbness.  All other systems reviewed and are negative.  Physical Exam Updated Vital Signs BP (!) 155/81   Pulse (!) 118   Temp 97.9 F (36.6 C) (Oral)   Resp 18   LMP 10/09/2019 (Exact Date)   SpO2 94%   Physical Exam Vitals and nursing note reviewed.  Constitutional:      General: She is not in acute distress.    Appearance: She is not ill-appearing, toxic-appearing or diaphoretic.  HENT:     Head: Normocephalic and atraumatic.     Jaw: There is normal jaw occlusion.     Right Ear: Tympanic membrane, ear canal and external ear normal. There is no impacted cerumen. No hemotympanum. Tympanic membrane is not injected, scarred,  perforated, erythematous, retracted or bulging.     Left Ear: Tympanic membrane, ear canal and external ear normal. There is no impacted cerumen. No hemotympanum. Tympanic membrane is not injected, scarred, perforated, erythematous, retracted or bulging.     Ears:     Comments: No Mastoid tenderness.    Nose:     Comments: Clear rhinorrhea and congestion to bilateral nares.  No sinus tenderness.    Mouth/Throat:     Comments: Posterior oropharynx clear.  Mucous membranes moist.  Tonsils without erythema or exudate.  Uvula midline without deviation.  No evidence of PTA or RPA.  No drooling, dysphasia or trismus.  Phonation normal. Neck:     Trachea: Trachea and phonation normal.     Comments: No Neck stiffness or neck rigidity.  No meningismus.  No cervical lymphadenopathy. Cardiovascular:     Comments: No murmurs rubs or gallops. Pulmonary:     Effort: Tachypnea present. No accessory muscle usage or respiratory distress.     Breath sounds: Decreased air movement and transmitted upper airway sounds present. Wheezing present.     Comments: Diffuse inspiratory and expiratory wheeze.  Patient tachypneic and tachycardic however not hypoxic.  She  can speak in short sentences however pauses to take a breath. Abdominal:     General: Bowel sounds are normal.     Palpations: Abdomen is soft.     Tenderness: There is no abdominal tenderness.     Comments: Soft, nontender without rebound or guarding.  No CVA tenderness.  Musculoskeletal:     Cervical back: Full passive range of motion without pain.     Comments: Moves all 4 extremities without difficulty.  Lower extremities without edema, erythema or warmth. Homans sign negative.  Skin:    Comments: Brisk capillary refill.  No rashes or lesions.  Neurological:     Mental Status: She is alert.     Comments: Ambulatory in department without difficulty.  Cranial nerves II through XII grossly intact.  No facial droop.  No aphasia.    ED Results / Procedures / Treatments   Labs (all labs ordered are listed, but only abnormal results are displayed) Labs Reviewed  BASIC METABOLIC PANEL - Abnormal; Notable for the following components:      Result Value   Potassium 3.3 (*)    Glucose, Bld 148 (*)    Calcium 8.6 (*)    All other components within normal limits  CBC WITH DIFFERENTIAL/PLATELET - Abnormal; Notable for the following components:   WBC 12.2 (*)    RBC 5.59 (*)    Hemoglobin 11.2 (*)    MCV 68.9 (*)    MCH 20.0 (*)    MCHC 29.1 (*)    RDW 18.3 (*)    Neutro Abs 9.1 (*)    All other components within normal limits  SARS CORONAVIRUS 2 BY RT PCR (HOSPITAL ORDER, [place] [facility])  I-STAT BETA HCG BLOOD, ED ([facility], [facility], [facility] ONLY)    EKG EKG Interpretation  Date/Time:  Thursday October 15 2019 09:56:25 EDT Ventricular Rate:  97 PR Interval:    QRS Duration: 92 QT Interval:  350 QTC Calculation: 445 R Axis:   31 Text Interpretation: Sinus rhythm Borderline T wave abnormalities No significant change since last tracing Confirmed by Theotis Burrow [phone]) on 10/15/2019 2:09:02 PM   Radiology DG Chest Port 1 View  Result Date: 10/15/2019 CLINICAL DATA:   Shortness of breath, question of asthma EXAM: PORTABLE CHEST 1 VIEW COMPARISON:  02/02/2019 FINDINGS: Cardiomediastinal contours and hilar structures are normal. Lungs are clear. No sign of pleural effusion. Visualized skeletal structures on limited assessment are unremarkable. IMPRESSION: No active disease. Electronically Signed   By: Zetta Bills M.D.   On: 10/15/2019 09:37    Procedures .Critical Care Performed by: Nettie Elm, PA-C Authorized by: Nettie Elm, PA-C   Critical care provider statement:    Critical care time (minutes):  45   Critical care was necessary to treat or prevent imminent or life-threatening deterioration of the following conditions:  Respiratory failure   Critical care was time spent personally by me on the following activities:  Discussions with consultants, evaluation of patient's response to treatment, examination of patient, ordering and performing treatments and interventions, ordering and review of laboratory studies, ordering and review of radiographic studies, pulse oximetry, re-evaluation of patient's condition, obtaining history from patient or surrogate and review of old charts   (including critical care time)  Medications Ordered in ED Medications  0.9 %  sodium chloride infusion ( Intravenous Stopped 10/15/19 0959)  magnesium sulfate IVPB 2 g 50 mL ( Intravenous Rate/Dose Verify 10/15/19 1141)  methylPREDNISolone sodium succinate (SOLU-MEDROL) 125 mg/2 mL injection 125 mg (125 mg Intravenous Given 10/15/19 0953)  ipratropium-albuterol (DUONEB) 0.5-2.5 (3) MG/3ML nebulizer solution 3 mL (3 mLs Nebulization Given 10/15/19 1110)  albuterol (VENTOLIN HFA) 108 (90 Base) MCG/ACT inhaler 6 puff (6 puffs Inhalation Given 10/15/19 1010)  ipratropium (ATROVENT HFA) inhaler 2 puff (2 puffs Inhalation Given 10/15/19 1009)  AeroChamber Plus Flo-Vu Large MISC 1 each (1 each Other Given 10/15/19 1011)  albuterol (PROVENTIL,VENTOLIN) solution continuous neb  (7.5 mg/hr Nebulization Given 10/15/19 1301)    ED Course  I have reviewed the triage vital signs and the nursing notes.  Pertinent labs & imaging results that were available during my care of the patient were reviewed by me and considered in my medical decision making (see chart for details).   44 year old female presents for evaluation of shortness of breath consistent with her prior asthma exacerbations.  Has been admitted to ICU and intubated previously.  She has inspiratory and expiratory wheeze however is nonrespiratory distress.  She is mildly tachypneic and tachycardic however not hypoxic.  She is able to speak in short sentences.  No known Covid exposures, and denies Covid vaccine.  No chest pain.  No clinical evidence of DVT on exam.  Plan on treating for asthma exacerbation and reassess.  Labs and imaging personally viewed and interpreted: CBC with leukocytosis at 12.2, hemoglobin 16.1 Metabolic panel mild hypokalemia at 3.3, hyperglycemia to 148 Covid negative Pregnancy test negative Chest x-ray without acute infiltrates, pneumothorax, pulmonary edema, cardiomegaly EKG without STEMI  1100: Reassessed.  Still has some mild tachypnea into the 20s.  Oxygen saturation 93% on room air.  Still has diffuse expiratory wheeze however has had some improvement with the magnesium and MDI.  Her Covid test is negative.  Nebs ordered.  1245: Patient sounds improved and she is moving more air however continues to have diffuse expiratory wheeze. Will order continuous neb  1400: Patient reassessed.  At end of continuous nebulizer. She continues to have diffuse expiratory wheeze however no acute respiratory distress. Patient feels improvement however occasionally drop sat to 89% on RA.  Will admit patient for observation, recurrent nebulizers.  Has received steroids, Mag, MDI, Neb and continuous neb.  CONSULT with Dr. Lonny Prude with [facility] who will evaluate for admission.  Low suspicion for ACS, PE,  dissection, bacterial internal  infectious process, heart failure, pneumothorax.  Discussed with attending Dr. Rex Kras who is in agreement with above treatment, plan and deposition.    MDM Rules/Calculators/A&P                           Final Clinical Impression(s) / ED Diagnoses Final diagnoses:  Severe persistent asthma with exacerbation    Rx / DC Orders ED Discharge Orders    None       Jaiyden Laur A, PA-C 10/15/19 1450    Little, Wenda Over[place], MD 10/16/19 [phone]

## 2019-10-15 NOTE — Progress Notes (Signed)
Notified MD in regards to BP in the 190s. MD added coreg and PRN hydralazine.

## 2019-10-16 LAB — BASIC METABOLIC PANEL
Anion gap: 9 (ref 5–15)
BUN: 11 mg/dL (ref 6–20)
CO2: 19 mmol/L — ABNORMAL LOW (ref 22–32)
Calcium: 8.7 mg/dL — ABNORMAL LOW (ref 8.9–10.3)
Chloride: 105 mmol/L (ref 98–111)
Creatinine, Ser: 0.67 mg/dL (ref 0.44–1.00)
GFR calc Af Amer: >60 mL/min (ref >60)
GFR calc non Af Amer: >60 mL/min (ref >60)
Glucose, Bld: 143 mg/dL — ABNORMAL HIGH (ref 70–99)
Potassium: 3.8 mmol/L (ref 3.5–5.1)
Sodium: 133 mmol/L — ABNORMAL LOW (ref 135–145)

## 2019-10-16 LAB — CBC
HCT: 35 % — ABNORMAL LOW (ref 36.0–46.0)
Hemoglobin: 10.3 g/dL — ABNORMAL LOW (ref 12.0–15.0)
MCH: 20 pg — ABNORMAL LOW (ref 26.0–34.0)
MCHC: 29.4 g/dL — ABNORMAL LOW (ref 30.0–36.0)
MCV: 68 fL — ABNORMAL LOW (ref 80.0–100.0)
Platelets: 341 10*3/uL (ref 150–400)
RBC: 5.15 MIL/uL — ABNORMAL HIGH (ref 3.87–5.11)
RDW: 17.4 % — ABNORMAL HIGH (ref 11.5–15.5)
WBC: 18.6 10*3/uL — ABNORMAL HIGH (ref 4.0–10.5)
nRBC: 0 % (ref 0.0–0.2)

## 2019-10-16 LAB — TSH: TSH: 0.278 u[IU]/mL — ABNORMAL LOW (ref 0.350–4.500)

## 2019-10-16 LAB — HIV ANTIBODY (ROUTINE TESTING W REFLEX): HIV Screen 4th Generation wRfx: NONREACTIVE

## 2019-10-16 MED ORDER — UMECLIDINIUM BROMIDE 62.5 MCG/INH IN AEPB
1.0000 | INHALATION_SPRAY | Freq: Every day | RESPIRATORY_TRACT | Status: DC
  Administered 2019-10-16 – 2019-10-17 (×2): 1 via RESPIRATORY_TRACT
  Filled 2019-10-16: qty 7

## 2019-10-16 MED ORDER — FLUTICASONE FUROATE-VILANTEROL 200-25 MCG/INH IN AEPB
1.0000 | INHALATION_SPRAY | Freq: Every day | RESPIRATORY_TRACT | Status: DC
  Administered 2019-10-16 – 2019-10-17 (×2): 1 via RESPIRATORY_TRACT
  Filled 2019-10-16: qty 28

## 2019-10-16 MED ORDER — ALBUTEROL SULFATE (2.5 MG/3ML) 0.083% IN NEBU
2.5000 mg | INHALATION_SOLUTION | Freq: Four times a day (QID) | RESPIRATORY_TRACT | Status: DC
  Administered 2019-10-16 – 2019-10-17 (×3): 2.5 mg via RESPIRATORY_TRACT
  Filled 2019-10-16 (×3): qty 3

## 2019-10-16 NOTE — Progress Notes (Signed)
[facility] PROGRESS NOTE   BENNETTE HASTY YIR:[id number] DOB: 07-Jun-1975 DOA: 10/15/2019  PCP: System, Pcp Not In  Brief History/Interval Summary: 44 y.o. female with medical history significant for severe persistent asthma, HTN, obesity who presented on 10/15/2019 with worsening shortness of breath. Patient states her son had an upper respiratory infection with cough and runny nose for a few days that resolved on its own.  She to had similar symptoms but did notice worsening shortness of breath that started the day before admission and did not improve despite her use of her home inhaler regimen.    Has a history of severe persistent asthma.  Previously followed by Dr. Vaughan Browner as outpatient has not seen him since October 2020.  She was last hospitalized for asthma exacerbation treated conservatively on 02/04/2019.  She does have a history of requiring intubation for asthma exacerbation during admission on 12/2017.  She reports adherence to her inhaler regimen, though via chart review she is only received refill for her albuterol.  She has not taken an inhaled steroid in a while.   Reason for Visit: Acute asthma exacerbation  Consultants: None  Procedures: None  Antibiotics: Anti-infectives (From admission, onward)   None      Subjective/Interval History: Patient states that she is feeling better.  Not as short of breath as yesterday.  Not wheezing as much.  Has a dry cough.  No chest pain.  No nausea vomiting.  ROS: Denies any lower extremity edema.    Assessment/Plan:  Acute asthma exacerbation with history of severe persistent asthma Patient was hospitalized and admitted to the stepdown unit.  She was placed on nebulizer treatments, steroids.  She was started on nebulized steroids as well.  Patient has improved with this treatment.  Continue for another 24 hours.  Mobilize.  Okay for transfer to the floor.  Patient has been lost to follow-up due to insurance/financial  issues.  The only inhaler she has at home is albuterol.  She used to be on Breo and Incruse previously.  We will send a referral to pulmonology for follow-up.  She has that now she has Medicaid.  Hopefully she will be able to afford steroid inhalers.  Hypertensive urgency Blood pressures have improved.  Patient is on carvedilol and amlodipine.  She will need prescriptions for the same.  Unclear if she was taking these medications at home.  Sinus tachycardia Most likely due to respiratory issues.  Now back to sinus rhythm.  Leukocytosis Possibly reactive yesterday.  Noted to be higher today which is most likely due to steroids.  Chronic microcytic anemia secondary to iron deficiency Hemoglobin is stable for the most part.  No evidence of overt bleeding.  Supposed to be on iron supplements at home.  Abnormal TSH Noted to be low.  Could be sick euthyroid.  We will check free T4.  Morbid obesity Estimated body mass index is 43.99 kg/m as calculated from the following:   Height as of this encounter: 5\' 7"  (1.702 m).   Weight as of this encounter: 127.4 kg.    DVT Prophylaxis: Subcutaneous heparin Code Status: Full code Family Communication: Discussed with the patient Disposition Plan:  Status is: Inpatient  Remains inpatient appropriate because:IV treatments appropriate due to intensity of illness or inability to take PO and Inpatient level of care appropriate due to severity of illness   Dispo: The patient is from: Home              Anticipated d/c is  to: Home              Anticipated d/c date is: 1 day              Patient currently is not medically stable to d/c.      Medications:  Scheduled: . amLODipine  10 mg Oral Daily  . budesonide (PULMICORT) nebulizer solution  0.5 mg Nebulization BID  . carvedilol  3.125 mg Oral BID WC  . Chlorhexidine Gluconate Cloth  6 each Topical Daily  . escitalopram  20 mg Oral Daily  . ferrous sulfate  325 mg Oral Q breakfast  .  guaiFENesin  600 mg Oral BID  . heparin  5,000 Units Subcutaneous Q8H  . ipratropium-albuterol  3 mL Nebulization Q4H  . methylPREDNISolone (SOLU-MEDROL) injection  40 mg Intravenous Q12H   Continuous:  NUU:[id number] **OR** acetaminophen, albuterol, hydrALAZINE, ondansetron **OR** ondansetron (ZOFRAN) IV, senna-docusate, traZODone   Objective:  Vital Signs  Vitals:   10/16/19 0700 10/16/19 0800 10/16/19 0828 10/16/19 0829  BP: 137/69     Pulse: 100     Resp: (!) 32     Temp:  (!) 97.4 F (36.3 C)    TempSrc:  Oral    SpO2: 90%  94% 98%  Weight:      Height:        Intake/Output Summary (Last 24 hours) at 10/16/2019 0946 Last data filed at 10/15/2019 1835 Gross per 24 hour  Intake 364.8 ml  Output 500 ml  Net -135.2 ml   Filed Weights   10/15/19 1800  Weight: 127.4 kg    General appearance: Awake alert.  In no distress Resp: Normal effort at rest.  Noted to have end expiratory wheezing bilaterally.  No rhonchi.  No definite crackles.  Cardio: S1-S2 is normal regular.  No S3-S4.  No rubs murmurs or bruit GI: Abdomen is soft.  Nontender nondistended.  Bowel sounds are present normal.  No masses organomegaly Extremities: No edema.  Full range of motion of lower extremities. Neurologic: Alert and oriented x3.  No focal neurological deficits.    Lab Results:  Data Reviewed: I have personally reviewed following labs and imaging studies  CBC: Recent Labs  Lab 10/15/19 0942 10/16/19 0213  WBC 12.2* 18.6*  NEUTROABS 9.1*  --   HGB 11.2* 10.3*  HCT 38.5 35.0*  MCV 68.9* 68.0*  PLT 323 259    Basic Metabolic Panel: Recent Labs  Lab 10/15/19 0942 10/16/19 0213  NA 137 133*  K 3.3* 3.8  CL 102 105  CO2 24 19*  GLUCOSE 148* 143*  BUN 6 11  CREATININE 0.70 0.67  CALCIUM 8.6* 8.7*    GFR: Estimated Creatinine Clearance: 128.4 mL/min (by C-G formula based on SCr of 0.67 mg/dL).  Thyroid Function Tests: Recent Labs    10/16/19 0213  TSH 0.278*      Recent Results (from the past 240 hour(s))  SARS Coronavirus 2 by RT PCR (hospital order, performed in [facility] hospital lab) Nasopharyngeal Nasopharyngeal Swab     Status: None   Collection Time: 10/15/19  9:42 AM   Specimen: Nasopharyngeal Swab  Result Value Ref Range Status   SARS Coronavirus 2 NEGATIVE NEGATIVE Final    Comment: (NOTE) SARS-CoV-2 target nucleic acids are NOT DETECTED.  The SARS-CoV-2 RNA is generally detectable in upper and lower respiratory specimens during the acute phase of infection. The lowest concentration of SARS-CoV-2 viral copies this assay can detect is 250 copies / mL. A negative result  does not preclude SARS-CoV-2 infection and should not be used as the sole basis for treatment or other patient management decisions.  A negative result may occur with improper specimen collection / handling, submission of specimen other than nasopharyngeal swab, presence of viral mutation(s) within the areas targeted by this assay, and inadequate number of viral copies (<250 copies / mL). A negative result must be combined with clinical observations, patient history, and epidemiological information.  Fact Sheet for Patients:   StrictlyIdeas.no  Fact Sheet for Healthcare Providers: BankingDealers.co.za  This test is not yet approved or  cleared by the [place] FDA and has been authorized for detection and/or diagnosis of SARS-CoV-2 by FDA under an Emergency Use Authorization (EUA).  This EUA will remain in effect (meaning this test can be used) for the duration of the COVID-19 declaration under Section 564(b)(1) of the Act, 21 U.S.C. section 360bbb-3(b)(1), unless the authorization is terminated or revoked sooner.  Performed at [facility], [place] [address], [place], [place] [address]   MRSA PCR Screening     Status: None   Collection Time: 10/15/19  6:06 PM   Specimen: Nasopharyngeal   Result Value Ref Range Status   MRSA by PCR NEGATIVE NEGATIVE Final    Comment:        The GeneXpert MRSA Assay (FDA approved for NASAL specimens only), is one component of a comprehensive MRSA colonization surveillance program. It is not intended to diagnose MRSA infection nor to guide or monitor treatment for MRSA infections. Performed at [facility], [place] [address] [place], [place] [address]       Radiology Studies: DG Chest Port 1 View  Result Date: 10/15/2019 CLINICAL DATA:  Shortness of breath, question of asthma EXAM: PORTABLE CHEST 1 VIEW COMPARISON:  02/02/2019 FINDINGS: Cardiomediastinal contours and hilar structures are normal. Lungs are clear. No sign of pleural effusion. Visualized skeletal structures on limited assessment are unremarkable. IMPRESSION: No active disease. Electronically Signed   By: Zetta Bills M.D.   On: 10/15/2019 09:37       LOS: 1 day   [facility] [place] [facility] Pager on [email or web address]  10/16/2019, 9:46 AM

## 2019-10-16 NOTE — Progress Notes (Signed)
Nutrition Brief Note  Patient identified on the Malnutrition Screening Tool (MST) Report  Pt with reported 2 lb weight loss lately per MST screen. This is insignificant for time frame. Per records, weight is stable.   Wt Readings from Last 15 Encounters:  10/15/19 127.4 kg  02/18/19 127.3 kg  02/12/19 127 kg  02/04/19 124.5 kg  01/28/19 126 kg  01/07/19 128.4 kg  11/06/18 122.7 kg  10/03/18 128.4 kg  10/03/18 128.4 kg  10/01/18 128.1 kg  09/09/18 124.8 kg  09/04/18 124.6 kg  07/15/18 128.9 kg  07/07/18 129.5 kg  06/09/18 127.1 kg    Body mass index is 43.99 kg/m. Patient meets criteria for morbid obesity based on current BMI.   Current diet order is heart healthy. Labs and medications reviewed.   No nutrition interventions warranted at this time. If nutrition issues arise, please consult RD.   Clayton Bibles, MS, RD, LDN Inpatient Clinical Dietitian Contact information available via Amion

## 2019-10-17 ENCOUNTER — Encounter (HOSPITAL_COMMUNITY): Payer: Self-pay | Admitting: *Deleted

## 2019-10-17 LAB — CBC
HCT: 37.4 % (ref 36.0–46.0)
Hemoglobin: 11.1 g/dL — ABNORMAL LOW (ref 12.0–15.0)
MCH: 20.3 pg — ABNORMAL LOW (ref 26.0–34.0)
MCHC: 29.7 g/dL — ABNORMAL LOW (ref 30.0–36.0)
MCV: 68.4 fL — ABNORMAL LOW (ref 80.0–100.0)
Platelets: 399 10*3/uL (ref 150–400)
RBC: 5.47 MIL/uL — ABNORMAL HIGH (ref 3.87–5.11)
RDW: 18.6 % — ABNORMAL HIGH (ref 11.5–15.5)
WBC: 23.4 10*3/uL — ABNORMAL HIGH (ref 4.0–10.5)
nRBC: 0 % (ref 0.0–0.2)

## 2019-10-17 LAB — T4, FREE: Free T4: 0.92 ng/dL (ref 0.61–1.12)

## 2019-10-17 LAB — BASIC METABOLIC PANEL
Anion gap: 9 (ref 5–15)
BUN: 13 mg/dL (ref 6–20)
CO2: 22 mmol/L (ref 22–32)
Calcium: 8.3 mg/dL — ABNORMAL LOW (ref 8.9–10.3)
Chloride: 101 mmol/L (ref 98–111)
Creatinine, Ser: 0.72 mg/dL (ref 0.44–1.00)
GFR calc Af Amer: >60 mL/min (ref >60)
GFR calc non Af Amer: >60 mL/min (ref >60)
Glucose, Bld: 134 mg/dL — ABNORMAL HIGH (ref 70–99)
Potassium: 3.8 mmol/L (ref 3.5–5.1)
Sodium: 132 mmol/L — ABNORMAL LOW (ref 135–145)

## 2019-10-17 MED ORDER — ESCITALOPRAM OXALATE 20 MG PO TABS: 20.0000 mg | ORAL_TABLET | Freq: Every day | ORAL | 0 refills | Status: DC

## 2019-10-17 MED ORDER — ALBUTEROL SULFATE (2.5 MG/3ML) 0.083% IN NEBU
2.5000 mg | INHALATION_SOLUTION | Freq: Three times a day (TID) | RESPIRATORY_TRACT | Status: DC
  Administered 2019-10-17: 2.5 mg via RESPIRATORY_TRACT
  Filled 2019-10-17: qty 3

## 2019-10-17 MED ORDER — TRAZODONE HCL 100 MG PO TABS: 100.0000 mg | ORAL_TABLET | Freq: Every evening | ORAL | 0 refills | Status: DC | PRN

## 2019-10-17 MED ORDER — CARVEDILOL 3.125 MG PO TABS: 3.1250 mg | ORAL_TABLET | Freq: Two times a day (BID) | ORAL | 1 refills | Status: DC

## 2019-10-17 MED ORDER — FAMOTIDINE 20 MG PO TABS
20.0000 mg | ORAL_TABLET | Freq: Two times a day (BID) | ORAL | 0 refills | Status: DC
Start: 2019-10-17 — End: 2020-07-04

## 2019-10-17 MED ORDER — PREDNISONE 20 MG PO TABS
ORAL_TABLET | ORAL | 0 refills | Status: DC
Start: 2019-10-17 — End: 2020-03-28

## 2019-10-17 MED ORDER — INCRUSE ELLIPTA 62.5 MCG/INH IN AEPB: 1.0000 | INHALATION_SPRAY | Freq: Every day | RESPIRATORY_TRACT | 2 refills | Status: DC

## 2019-10-17 MED ORDER — FERROUS SULFATE 325 (65 FE) MG PO TABS: 325.0000 mg | ORAL_TABLET | Freq: Every day | ORAL | 0 refills | Status: DC

## 2019-10-17 MED ORDER — AMLODIPINE BESYLATE 10 MG PO TABS
10.0000 mg | ORAL_TABLET | Freq: Every day | ORAL | 1 refills | Status: DC
Start: 2019-10-17 — End: 2020-07-04

## 2019-10-17 MED ORDER — FLUTICASONE FUROATE-VILANTEROL 200-25 MCG/INH IN AEPB: 1.0000 | INHALATION_SPRAY | Freq: Every day | RESPIRATORY_TRACT | 1 refills | Status: DC

## 2019-10-17 NOTE — Progress Notes (Signed)
Nurse reviewed discharge instructions with pt.  Pt verbalized understanding of discharge instructions, follow up appointments and new medications.  Pt given inhalers to take home and work note prior to discharge.

## 2019-10-17 NOTE — Discharge Summary (Signed)
[facility]  Physician Discharge Summary   Patient ID: Jenna Stephens MRN: [id number] DOB/AGE: [birth year] 44 y.o.  Admit date: 10/15/2019 Discharge date: 10/17/2019  PCP: System, Pcp Not In  DISCHARGE DIAGNOSES:  Acute asthma exacerbation Essential hypertension Chronic microcytic anemia Morbid obesity   RECOMMENDATIONS FOR OUTPATIENT FOLLOW UP: 1. Referral sent to pulmonology for follow-up.  Used to be seen by Dr. Vaughan Browner.   Home Health: None Equipment/Devices: None  CODE STATUS: Full code  DISCHARGE CONDITION: fair  Diet recommendation: Low-sodium  INITIAL HISTORY: 44 y.o.femalewith medical history significant forsevere persistent asthma, HTN, obesity who presented on 6/24/2021with worsening shortness of breath. Patient states her son had an upper respiratory infection with cough and runny nose for a few days that resolved on its own. She to had similar symptoms but did notice worsening shortness of breath that started the day before admission and did not improve despite her use of her home inhaler regimen.   Has a history of severe persistent asthma. Previously followed by Dr. Vaughan Browner as outpatient has not seen him since October 2020. She was last hospitalized for asthma exacerbation treated conservatively on 02/04/2019. She does have a history of requiring intubation for asthma exacerbation during admission on 12/2017. She reports adherence to her inhaler regimen, though via chart review she is only received refill for her albuterol.  She has not taken an inhaled steroid in a while.   HOSPITAL COURSE:   Acute asthma exacerbation with history of severe persistent asthma Patient was hospitalized and admitted to the stepdown unit.  She was placed on nebulizer treatments, steroids.  She was started on nebulized steroids as well.  Patient has improved with this treatment.    She was started back on her inhaled steroids.  She now has insurance and will be able to  afford her medications.  Referral also sent to her pulmonologist for follow-up.  She will also be discharged on tapering doses of prednisone.  Essential hypertension /hypertensive urgency With significantly elevated blood pressures.  She used to be on carvedilol and amlodipine and has been unable to afford them.  Started back on her home medications with improvement in blood pressure.  Prescriptions written for same.    Sinus tachycardia Most likely due to respiratory issues.  Now back to sinus rhythm.  Leukocytosis Can reduce steroids.  Chronic microcytic anemia secondary to iron deficiency Hemoglobin is stable for the most part.  No evidence of overt bleeding.  Supposed to be on iron supplements at home.  Abnormal TSH TSH was noted to be low 0.27.  Free T4 normal at 0.92.  Morbid obesity Estimated body mass index is 43.99 kg/m as calculated from the following:   Height as of this encounter: 5\' 7"  (1.702 m).   Weight as of this encounter: 127.4 kg.   She remains stable.  Okay for discharge home today.  PERTINENT LABS:  The results of significant diagnostics from this hospitalization (including imaging, microbiology, ancillary and laboratory) are listed below for reference.    Microbiology: Recent Results (from the past 240 hour(s))  SARS Coronavirus 2 by RT PCR (hospital order, performed in [facility] hospital lab) Nasopharyngeal Nasopharyngeal Swab     Status: None   Collection Time: 10/15/19  9:42 AM   Specimen: Nasopharyngeal Swab  Result Value Ref Range Status   SARS Coronavirus 2 NEGATIVE NEGATIVE Final    Comment: (NOTE) SARS-CoV-2 target nucleic acids are NOT DETECTED.  The SARS-CoV-2 RNA is generally detectable in upper and lower respiratory  specimens during the acute phase of infection. The lowest concentration of SARS-CoV-2 viral copies this assay can detect is 250 copies / mL. A negative result does not preclude SARS-CoV-2 infection and should not be  used as the sole basis for treatment or other patient management decisions.  A negative result may occur with improper specimen collection / handling, submission of specimen other than nasopharyngeal swab, presence of viral mutation(s) within the areas targeted by this assay, and inadequate number of viral copies (<250 copies / mL). A negative result must be combined with clinical observations, patient history, and epidemiological information.  Fact Sheet for Patients:   StrictlyIdeas.no  Fact Sheet for Healthcare Providers: BankingDealers.co.za  This test is not yet approved or  cleared by the [place] FDA and has been authorized for detection and/or diagnosis of SARS-CoV-2 by FDA under an Emergency Use Authorization (EUA).  This EUA will remain in effect (meaning this test can be used) for the duration of the COVID-19 declaration under Section 564(b)(1) of the Act, 21 U.S.C. section 360bbb-3(b)(1), unless the authorization is terminated or revoked sooner.  Performed at [facility], [place] [address], [place], [place] [address]   MRSA PCR Screening     Status: None   Collection Time: 10/15/19  6:06 PM   Specimen: Nasopharyngeal  Result Value Ref Range Status   MRSA by PCR NEGATIVE NEGATIVE Final    Comment:        The GeneXpert MRSA Assay (FDA approved for NASAL specimens only), is one component of a comprehensive MRSA colonization surveillance program. It is not intended to diagnose MRSA infection nor to guide or monitor treatment for MRSA infections. Performed at [facility], [place] [address], [place], [place] [address]      Labs:  COVID-19 Labs   Lab Results  Component Value Date   [place] NEGATIVE 10/15/2019   [place] NEGATIVE 02/02/2019   [place] NOT DETECTED 10/03/2018      Basic Metabolic Panel: Recent Labs  Lab 10/15/19 0942 10/16/19 0213  10/17/19 0508  NA 137 133* 132*  K 3.3* 3.8 3.8  CL 102 105 101  CO2 24 19* 22  GLUCOSE 148* 143* 134*  BUN 6 11 13   CREATININE 0.70 0.67 0.72  CALCIUM 8.6* 8.7* 8.3*   CBC: Recent Labs  Lab 10/15/19 0942 10/16/19 0213 10/17/19 0508  WBC 12.2* 18.6* 23.4*  NEUTROABS 9.1*  --   --   HGB 11.2* 10.3* 11.1*  HCT 38.5 35.0* 37.4  MCV 68.9* 68.0* 68.4*  PLT 323 341 399     IMAGING STUDIES DG Chest Port 1 View  Result Date: 10/15/2019 CLINICAL DATA:  Shortness of breath, question of asthma EXAM: PORTABLE CHEST 1 VIEW COMPARISON:  02/02/2019 FINDINGS: Cardiomediastinal contours and hilar structures are normal. Lungs are clear. No sign of pleural effusion. Visualized skeletal structures on limited assessment are unremarkable. IMPRESSION: No active disease. Electronically Signed   By: Zetta Bills M.D.   On: 10/15/2019 09:37    DISCHARGE EXAMINATION: Vitals:   10/17/19 0151 10/17/19 0223 10/17/19 0540 10/17/19 0739  BP: (!) 146/94  138/88   Pulse: 82  67 73  Resp: 16  15 18   Temp: 97.8 F (36.6 C)  97.9 F (36.6 C)   TempSrc: Oral  Oral   SpO2: 90% 93% 94% 94%  Weight:      Height:       General appearance: Awake alert.  In no distress Resp: Improved air entry bilaterally.  Much less wheezing today compared to yesterday.  Normal effort. Cardio: S1-S2 is normal regular.  No S3-S4.  No rubs murmurs or bruit GI: Abdomen is soft.  Nontender nondistended.  Bowel sounds are present normal.  No masses organomegaly Extremities: No edema.  Full range of motion of lower extremities. Neurologic: Alert and oriented x3.  No focal neurological deficits.    DISPOSITION: Home  Discharge Instructions    Ambulatory referral to Pulmonology   Complete by: As directed    Was lost to follow up and was not able to afford her inhalers. Had to be admitted for exacerbation. F/u in 3-4 weeks   Reason for referral: Asthma/COPD   Call MD for:  difficulty breathing, headache or visual  disturbances   Complete by: As directed    Call MD for:  extreme fatigue   Complete by: As directed    Call MD for:  persistant dizziness or light-headedness   Complete by: As directed    Call MD for:  persistant nausea and vomiting   Complete by: As directed    Call MD for:  severe uncontrolled pain   Complete by: As directed    Call MD for:  temperature >100.4   Complete by: As directed    Diet - low sodium heart healthy   Complete by: As directed    Discharge instructions   Complete by: As directed    Referral has been sent to pulmonology office for follow-up.  Please take your medications as prescribed.  You were cared for by a hospitalist during your hospital stay. If you have any questions about your discharge medications or the care you received while you were in the hospital after you are discharged, you can call the unit and asked to speak with the hospitalist on call if the hospitalist that took care of you is not available. Once you are discharged, your primary care physician will handle any further medical issues. Please note that NO REFILLS for any discharge medications will be authorized once you are discharged, as it is imperative that you return to your primary care physician (or establish a relationship with a primary care physician if you do not have one) for your aftercare needs so that they can reassess your need for medications and monitor your lab values. If you do not have a primary care physician, you can call [phone] for a physician referral.   Increase activity slowly   Complete by: As directed         Allergies as of 10/17/2019   No Known Allergies     Medication List    STOP taking these medications   hydrOXYzine 50 MG tablet Commonly known as: ATARAX/VISTARIL   lansoprazole 30 MG capsule Commonly known as: PREVACID   pantoprazole 40 MG tablet Commonly known as: PROTONIX     TAKE these medications   albuterol 108 (90 Base) MCG/ACT  inhaler Commonly known as: VENTOLIN HFA Inhale 2 puffs into the lungs every 6 (six) hours as needed for wheezing or shortness of breath.   amLODipine 10 MG tablet Commonly known as: NORVASC Take 1 tablet (10 mg total) by mouth daily. What changed:   medication strength  how much to take   brompheniramine-pseudoephedrine-DM 30-2-10 MG/5ML syrup Take 1.25 mLs by mouth 4 (four) times daily as needed (cough).   carvedilol 3.125 MG tablet Commonly known as: COREG Take 1 tablet (3.125 mg total) by mouth 2 (two) times daily with a meal.   escitalopram 20 MG  tablet Commonly known as: LEXAPRO Take 1 tablet (20 mg total) by mouth daily.   famotidine 20 MG tablet Commonly known as: Pepcid Take 1 tablet (20 mg total) by mouth 2 (two) times daily.   ferrous sulfate 325 (65 FE) MG tablet Take 1 tablet (325 mg total) by mouth daily with breakfast.   fluticasone furoate-vilanterol 200-25 MCG/INH Aepb Commonly known as: BREO ELLIPTA Inhale 1 puff into the lungs daily. Start taking on: October 18, 2019   Incruse Ellipta 62.5 MCG/INH Aepb Generic drug: umeclidinium bromide Inhale 1 puff into the lungs daily.   ipratropium-albuterol 0.5-2.5 (3) MG/3ML Soln Commonly known as: DUONEB Take 3 mLs by nebulization every 4 (four) hours as needed.   Latex Gloves Large Misc Please provide patient 3 boxes of medicaid approved gloves   Masks Misc Please provide patient 3 boxes of medicaid approved face masks.   predniSONE 20 MG tablet Commonly known as: DELTASONE Take 3 tablets once daily for 3 days followed by 2 tablets once daily for 3 days followed by 1 tablet once daily for 3 days and then stop   traZODone 100 MG tablet Commonly known as: DESYREL Take 1 tablet (100 mg total) by mouth at bedtime as needed for sleep.         Follow-up Information    Mannam, Hart Robinsons, MD. Schedule an appointment as soon as possible for a visit in 3 week(s).   Specialty: Pulmonary Disease Why: referral  sent Contact information: [place] [address] [place] [address] [phone]               TOTAL DISCHARGE TIME: 71 minutes  [facility] Pager on [email or web address]  10/17/2019, 12:45 PM

## 2019-10-17 NOTE — Discharge Instructions (Signed)
Asthma, Adult  Asthma is a long-term (chronic) condition in which the airways get tight and narrow. The airways are the breathing passages that lead from the nose and mouth down into the lungs. A person with asthma will have times when symptoms get worse. These are called asthma attacks. They can cause coughing, whistling sounds when you breathe (wheezing), shortness of breath, and chest pain. They can make it hard to breathe. There is no cure for asthma, but medicines and lifestyle changes can help control it. There are many things that can bring on an asthma attack or make asthma symptoms worse (triggers). Common triggers include:  Mold.  Dust.  Cigarette smoke.  Cockroaches.  Things that can cause allergy symptoms (allergens). These include animal skin flakes (dander) and pollen from trees or grass.  Things that pollute the air. These may include household cleaners, wood smoke, smog, or chemical odors.  Cold air, weather changes, and wind.  Crying or laughing hard.  Stress.  Certain medicines or drugs.  Certain foods such as dried fruit, potato chips, and grape juice.  Infections, such as a cold or the flu.  Certain medical conditions or diseases.  Exercise or tiring activities. Asthma may be treated with medicines and by staying away from the things that cause asthma attacks. Types of medicines may include:  Controller medicines. These help prevent asthma symptoms. They are usually taken every day.  Fast-acting reliever or rescue medicines. These quickly relieve asthma symptoms. They are used as needed and provide short-term relief.  Allergy medicines if your attacks are brought on by allergens.  Medicines to help control the body's defense (immune) system. Follow these instructions at home: Avoiding triggers in your home  Change your heating and air conditioning filter often.  Limit your use of fireplaces and wood stoves.  Get rid of pests (such as roaches and  mice) and their droppings.  Throw away plants if you see mold on them.  Clean your floors. Dust regularly. Use cleaning products that do not smell.  Have someone vacuum when you are not home. Use a vacuum cleaner with a HEPA filter if possible.  Replace carpet with wood, tile, or vinyl flooring. Carpet can trap animal skin flakes and dust.  Use allergy-proof pillows, mattress covers, and box spring covers.  Wash bed sheets and blankets every week in hot water. Dry them in a dryer.  Keep your bedroom free of any triggers.  Avoid pets and keep windows closed when things that cause allergy symptoms are in the air.  Use blankets that are made of polyester or cotton.  Clean bathrooms and kitchens with bleach. If possible, have someone repaint the walls in these rooms with mold-resistant paint. Keep out of the rooms that are being cleaned and painted.  Wash your hands often with soap and water. If soap and water are not available, use hand sanitizer.  Do not allow anyone to smoke in your home. General instructions  Take over-the-counter and prescription medicines only as told by your doctor. ? Talk with your doctor if you have questions about how or when to take your medicines. ? Make note if you need to use your medicines more often than usual.  Do not use any products that contain nicotine or tobacco, such as cigarettes and e-cigarettes. If you need help quitting, ask your doctor.  Stay away from secondhand smoke.  Avoid doing things outdoors when allergen counts are high and when air quality is low.  Wear a ski mask   when doing outdoor activities in the winter. The mask should cover your nose and mouth. Exercise indoors on cold days if you can.  Warm up before you exercise. Take time to cool down after exercise.  Use a peak flow meter as told by your doctor. A peak flow meter is a tool that measures how well the lungs are working.  Keep track of the peak flow meter's readings.  Write them down.  Follow your asthma action plan. This is a written plan for taking care of your asthma and treating your attacks.  Make sure you get all the shots (vaccines) that your doctor recommends. Ask your doctor about a flu shot and a pneumonia shot.  Keep all follow-up visits as told by your doctor. This is important. Contact a doctor if:  You have wheezing, shortness of breath, or a cough even while taking medicine to prevent attacks.  The mucus you cough up (sputum) is thicker than usual.  The mucus you cough up changes from clear or white to yellow, green, gray, or bloody.  You have problems from the medicine you are taking, such as: ? A rash. ? Itching. ? Swelling. ? Trouble breathing.  You need reliever medicines more than 2-3 times a week.  Your peak flow reading is still at 50-79% of your personal best after following the action plan for 1 hour.  You have a fever. Get help right away if:  You seem to be worse and are not responding to medicine during an asthma attack.  You are short of breath even at rest.  You get short of breath when doing very little activity.  You have trouble eating, drinking, or talking.  You have chest pain or tightness.  You have a fast heartbeat.  Your lips or fingernails start to turn blue.  You are light-headed or dizzy, or you faint.  Your peak flow is less than 50% of your personal best.  You feel too tired to breathe normally. Summary  Asthma is a long-term (chronic) condition in which the airways get tight and narrow. An asthma attack can make it hard to breathe.  Asthma cannot be cured, but medicines and lifestyle changes can help control it.  Make sure you understand how to avoid triggers and how and when to use your medicines. This information is not intended to replace advice given to you by your health care provider. Make sure you discuss any questions you have with your health care provider. Document Revised:  06/12/2018 Document Reviewed: 05/14/2016 Elsevier Patient Education  2020 Elsevier Inc.  

## 2020-03-28 ENCOUNTER — Ambulatory Visit (INDEPENDENT_AMBULATORY_CARE_PROVIDER_SITE_OTHER): Payer: Medicaid Other | Admitting: Primary Care

## 2020-03-28 ENCOUNTER — Encounter: Payer: Self-pay | Admitting: Pulmonary Disease

## 2020-03-28 ENCOUNTER — Encounter: Payer: Self-pay | Admitting: *Deleted

## 2020-03-28 ENCOUNTER — Telehealth: Payer: Self-pay | Admitting: Pulmonary Disease

## 2020-03-28 ENCOUNTER — Encounter: Payer: Self-pay | Admitting: Primary Care

## 2020-03-28 ENCOUNTER — Other Ambulatory Visit: Payer: Self-pay

## 2020-03-28 DIAGNOSIS — J4551 Severe persistent asthma with (acute) exacerbation: Secondary | ICD-10-CM

## 2020-03-28 DIAGNOSIS — J4550 Severe persistent asthma, uncomplicated: Secondary | ICD-10-CM

## 2020-03-28 MED ORDER — PREDNISONE 10 MG PO TABS: ORAL_TABLET | ORAL | 0 refills | Status: AC

## 2020-03-28 MED ORDER — FLUTICASONE FUROATE-VILANTEROL 200-25 MCG/INH IN AEPB: 1.0000 | INHALATION_SPRAY | Freq: Every day | RESPIRATORY_TRACT | 1 refills | Status: DC

## 2020-03-28 MED ORDER — BREO ELLIPTA 200-25 MCG/INH IN AEPB: 1.0000 | INHALATION_SPRAY | Freq: Every day | RESPIRATORY_TRACT | 0 refills | Status: DC

## 2020-03-28 NOTE — Telephone Encounter (Signed)
PCCM:  I received a call from the answer service this morning.  I called patient and there was no answer.  Please have triage pool call patient and forward to Dr. Vaughan Browner.  Garner Nash, DO [facility] 03/28/2020 8:09 AM

## 2020-03-28 NOTE — Patient Instructions (Addendum)
Recommendations: - Resume BREO 200- take 1 puff daily (rinse mouth after use) - Use ipratropium-albuterol nebulizer FOUR times a day; albuterol rescue inhaler every 4-6 hours in between as needed for breakthrough shortness of breath/wheeing - Take Pepcid TWICE a day  - Take prednisone taper as prescribed  Referral: - [facility] re: asthma/medication management   Rx: - Breo sample and RX sent to [facility] in [place]  Follow-up: - December 22nd at 10:15 with Dr. Vaughan Browner  - ED if symptoms acutely worsen or O2 level sustaining <88%    Asthma, Adult  Asthma is a long-term (chronic) condition in which the airways get tight and narrow. The airways are the breathing passages that lead from the nose and mouth down into the lungs. A person with asthma will have times when symptoms get worse. These are called asthma attacks. They can cause coughing, whistling sounds when you breathe (wheezing), shortness of breath, and chest pain. They can make it hard to breathe. There is no cure for asthma, but medicines and lifestyle changes can help control it. There are many things that can bring on an asthma attack or make asthma symptoms worse (triggers). Common triggers include:  Mold.  Dust.  Cigarette smoke.  Cockroaches.  Things that can cause allergy symptoms (allergens). These include animal skin flakes (dander) and pollen from trees or grass.  Things that pollute the air. These may include household cleaners, wood smoke, smog, or chemical odors.  Cold air, weather changes, and wind.  Crying or laughing hard.  Stress.  Certain medicines or drugs.  Certain foods such as dried fruit, potato chips, and grape juice.  Infections, such as a cold or the flu.  Certain medical conditions or diseases.  Exercise or tiring activities. Asthma may be treated with medicines and by staying away from the things that cause asthma attacks. Types of  medicines may include:  Controller medicines. These help prevent asthma symptoms. They are usually taken every day.  Fast-acting reliever or rescue medicines. These quickly relieve asthma symptoms. They are used as needed and provide short-term relief.  Allergy medicines if your attacks are brought on by allergens.  Medicines to help control the body's defense (immune) system. Follow these instructions at home: Avoiding triggers in your home  Change your heating and air conditioning filter often.  Limit your use of fireplaces and wood stoves.  Get rid of pests (such as roaches and mice) and their droppings.  Throw away plants if you see mold on them.  Clean your floors. Dust regularly. Use cleaning products that do not smell.  Have someone vacuum when you are not home. Use a vacuum cleaner with a HEPA filter if possible.  Replace carpet with wood, tile, or vinyl flooring. Carpet can trap animal skin flakes and dust.  Use allergy-proof pillows, mattress covers, and box spring covers.  Wash bed sheets and blankets every week in hot water. Dry them in a dryer.  Keep your bedroom free of any triggers.  Avoid pets and keep windows closed when things that cause allergy symptoms are in the air.  Use blankets that are made of polyester or cotton.  Clean bathrooms and kitchens with bleach. If possible, have someone repaint the walls in these rooms with mold-resistant paint. Keep out of the rooms that are being cleaned and painted.  Wash your hands often with soap and water. If soap and water are not available, use hand sanitizer.  Do not allow anyone  to smoke in your home. General instructions  Take over-the-counter and prescription medicines only as told by your doctor. ? Talk with your doctor if you have questions about how or when to take your medicines. ? Make note if you need to use your medicines more often than usual.  Do not use any products that contain nicotine or  tobacco, such as cigarettes and e-cigarettes. If you need help quitting, ask your doctor.  Stay away from secondhand smoke.  Avoid doing things outdoors when allergen counts are high and when air quality is low.  Wear a ski mask when doing outdoor activities in the winter. The mask should cover your nose and mouth. Exercise indoors on cold days if you can.  Warm up before you exercise. Take time to cool down after exercise.  Use a peak flow meter as told by your doctor. A peak flow meter is a tool that measures how well the lungs are working.  Keep track of the peak flow meter's readings. Write them down.  Follow your asthma action plan. This is a written plan for taking care of your asthma and treating your attacks.  Make sure you get all the shots (vaccines) that your doctor recommends. Ask your doctor about a flu shot and a pneumonia shot.  Keep all follow-up visits as told by your doctor. This is important. Contact a doctor if:  You have wheezing, shortness of breath, or a cough even while taking medicine to prevent attacks.  The mucus you cough up (sputum) is thicker than usual.  The mucus you cough up changes from clear or white to yellow, green, gray, or bloody.  You have problems from the medicine you are taking, such as: ? A rash. ? Itching. ? Swelling. ? Trouble breathing.  You need reliever medicines more than 2-3 times a week.  Your peak flow reading is still at 50-79% of your personal best after following the action plan for 1 hour.  You have a fever. Get help right away if:  You seem to be worse and are not responding to medicine during an asthma attack.  You are short of breath even at rest.  You get short of breath when doing very little activity.  You have trouble eating, drinking, or talking.  You have chest pain or tightness.  You have a fast heartbeat.  Your lips or fingernails start to turn blue.  You are light-headed or dizzy, or you  faint.  Your peak flow is less than 50% of your personal best.  You feel too tired to breathe normally. Summary  Asthma is a long-term (chronic) condition in which the airways get tight and narrow. An asthma attack can make it hard to breathe.  Asthma cannot be cured, but medicines and lifestyle changes can help control it.  Make sure you understand how to avoid triggers and how and when to use your medicines. This information is not intended to replace advice given to you by your health care provider. Make sure you discuss any questions you have with your health care provider. Document Revised: 06/12/2018 Document Reviewed: 05/14/2016 Elsevier Patient Education  2020 [employer].

## 2020-03-28 NOTE — Addendum Note (Signed)
Addended by: Lauraine Rinne on: 03/28/2020 09:28 AM   Modules accepted: Orders

## 2020-03-28 NOTE — Progress Notes (Signed)
Virtual Visit via Telephone Note  I connected with Jenna Stephens on 03/28/20 at  3:00 PM EST by telephone and verified that I am speaking with the correct person using two identifiers.  Location: Patient: Home Provider: Office   I discussed the limitations, risks, security and privacy concerns of performing an evaluation and management service by telephone and the availability of in person appointments. I also discussed with the patient that there may be a patient responsible charge related to this service. The patient expressed understanding and agreed to proceed.   History of Present Illness: 44 year old female, former smoker. PMH significant for severe persistent asthma, HTN, GERD, obesity. Patient of Dr. Vaughan Browner, last seen on 02/12/20.   Pets: Has 2 [place] pigs, no cats, dogs, birds or farm animals Occupation: Works in [employer] support for [employer]: Reports that bathroom has a leak however no known mold, no hot tub, Jacuzzi Smoking history: Smokes 2 cigarettes a day, smokes marijuana, no vaping Travel history: No significant travel history Relevant family history: Mother has asthma  Previous [facility] pulmonary encounters: 02/12/20- Dr. Vaughan Browner Admitted to hospital from 10/12-10/14/2020 with asthma exacerbation improved with IV Solu-Medrol She is finished prednisone taper but still continues to be dyspneic Continues on Breo, Incruse, Singulair and PPI for GERD   03/28/2020- Interim hx  Patient contacted our office this morning outside hours. Dr. Valeta Harms attempted to call her back but there was no answer. Wyn Quaker spoke with her 03/28/20 at 9am and pateint was audibly struggling with her breathing. She was last seen in our office in October 2020. During this visit Dr. Vaughan Browner recommended starting Nucala. It appears our office attempted to contacted her by phone three times with no response regarding starting medication. She was hospitalized in June 2021 for asthma exacerbations. She ran  out of [place] over 3 months ago. She was sent in a prednisone today this morning and set up for a televisit this afternoon.   She lives with her husband and 2 childten. Works from home, does a lot of talking on the phone with exacerbated her symptoms. She has not been vaccinated for covid. States that she does not leave her house and has had no known exposures. Her husband and son have been vaccinated. She is not currently taking ANY medication except for pepcid as needed and prn Albuterol. She has not been on BREO inhaler for 3 months. She has been using albuterol hfa every 2-3 hours since Thursday 03/24/20 with temporary improvement. She states that medicaid stopped paying for her medication. She will be getting insurance through [facility] come January 1st 2022. She does not have a PCP.    Observations/Objective:   - Patient reports pulse oximeter O2 92% room air - Audible voice hoarseness and shortness of breath with speaking  Data Reviewed: Imaging: CTA 12/29/17-no pulmonary embolism in the main pulmonary arteries,mildnodular patchy opacities in the left apex.   Chest x-ray 02/02/2019-no active cardiopulmonary disease I have reviewed the images personally.  PFTs: 01/30/2018 FVC 3.22 [101%), FEV1 2.87 [110%), F/F 89, TLC 93%, DLCO 100% Normal test  FENO 01/09/18-5 FENO 03/01/2018-11.  Labs: CBC 12/29/2017-WBC 19.7, eos 9%, absolute eosinophil count 1773 CBC 02/02/2019-WBC 8.2, eos 5%, absolute eosinophil count 410  Blood allergy profile 12/30/2017-IgE 214, RAST panel shows sensitivity to dust mite, cat, dog, tree, grass pollen, mouse urine   ACT score 02/12/2019-6   Assessment and Plan:  Severe persistent asthma with acute exacerbation: - T2 high inflammation with recurrent exacerbations and  hospitalizations. Poorly controlled, medication compliance is an issue d/t cost. She started prednisone taper today. We will provide patient with a sample of BREO 200 and have sent a  prescription to [facility]. She will likely need patient assistance for BREO and potentially for Nucala in the future. She will be getting [employer] through her work in January 2022. Advised she take Pepcid twice daily as reflux would be contributing to asthma symptoms. Follow-up apt scheduled for 04/13/20 with Dr. Vaughan Browner.   OSA: - Ordered for split-night sleep study in October 2020, does not appear this has been completed - Will need to be addressed at next apt   Follow Up Instructions:  - Follow-up apt scheduled for 04/13/20 with Dr. Vaughan Browner.   I discussed the assessment and treatment plan with the patient. The patient was provided an opportunity to ask questions and all were answered. The patient agreed with the plan and demonstrated an understanding of the instructions.   The patient was advised to call back or seek an in-person evaluation if the symptoms worsen or if the condition fails to improve as anticipated.  I provided 22 minutes of non-face-to-face time during this encounter.   Martyn Ehrich, NP

## 2020-03-28 NOTE — Telephone Encounter (Signed)
03/28/20   Was able to contact the patient.  Patient is audibly struggling with her breathing.  She was last seen in our office in October/2020.  Hospitalized in June/2021 for an asthma exacerbation.  She reports that she ran out of [place] over 3 months ago.  She is had acute worsened symptoms of breathing since 03/24/2020 with a dry cough.  She denies fevers.  It appears that she is unvaccinated COVID-19.  Plan: We will get patient scheduled with Geraldo Pitter, NP for a televisit on 03/28/2020 at 3 PM We will go ahead and place patient on prednisone taper Reviewed with patient if symptoms worsen she needs to seek emergent care Patient will also need close follow-up with our practice to get set back up on maintenance inhalers given the fact that she is out of [place].

## 2020-04-01 ENCOUNTER — Telehealth: Payer: Self-pay | Admitting: Primary Care

## 2020-04-01 DIAGNOSIS — J4550 Severe persistent asthma, uncomplicated: Secondary | ICD-10-CM

## 2020-04-01 NOTE — Telephone Encounter (Signed)
Form signed by Derl Barrow, NP. Waiting on Kathlee Nations to drop charge - Patient aware to sign my necessary documents at 04/13/2020 visit with Dr. Vaughan Browner. -pr

## 2020-04-01 NOTE — Telephone Encounter (Signed)
Rec'd faxed short term disability forms from [employer]. Prepared forms - give to Derl Barrow, NP for signature. -pr

## 2020-04-04 NOTE — Telephone Encounter (Signed)
Charge entered - Called and received payment info via phone. Faxed forms to [employer] at [phone]. -pr

## 2020-04-13 ENCOUNTER — Ambulatory Visit (INDEPENDENT_AMBULATORY_CARE_PROVIDER_SITE_OTHER): Payer: Medicaid Other | Admitting: Pulmonary Disease

## 2020-04-13 ENCOUNTER — Encounter: Payer: Self-pay | Admitting: Pulmonary Disease

## 2020-04-13 ENCOUNTER — Other Ambulatory Visit: Payer: Self-pay

## 2020-04-13 ENCOUNTER — Encounter: Payer: Self-pay | Admitting: *Deleted

## 2020-04-13 VITALS — BP 144/100 | HR 119 | Temp 98.6°F | Ht 67.0 in | Wt 288.4 lb

## 2020-04-13 DIAGNOSIS — J4550 Severe persistent asthma, uncomplicated: Secondary | ICD-10-CM | POA: Diagnosis not present

## 2020-04-13 DIAGNOSIS — Z23 Encounter for immunization: Secondary | ICD-10-CM | POA: Diagnosis not present

## 2020-04-13 MED ORDER — TRELEGY ELLIPTA 200-62.5-25 MCG/INH IN AEPB: 1.0000 | INHALATION_SPRAY | Freq: Every day | RESPIRATORY_TRACT | 2 refills | Status: DC

## 2020-04-13 MED ORDER — MONTELUKAST SODIUM 10 MG PO TABS
10.0000 mg | ORAL_TABLET | Freq: Every day | ORAL | 5 refills | Status: DC
Start: 2020-04-13 — End: 2021-10-02

## 2020-04-13 MED ORDER — TRELEGY ELLIPTA 200-62.5-25 MCG/INH IN AEPB: 1.0000 | INHALATION_SPRAY | Freq: Every day | RESPIRATORY_TRACT | 0 refills | Status: DC

## 2020-04-13 NOTE — Patient Instructions (Signed)
Stop the Breo and start Trelegy 200 inhaler.  We will give you some samples today to help you Start Singulair 10 mg a day  Hopefully with the new insurance will be able to get restarted on the injection therapy next month I will see you back in clinic in early January to reassess and plan for next steps

## 2020-04-13 NOTE — Progress Notes (Signed)
Jenna Stephens    [id number]    02/06/1976  Primary Care Physician:Pcp, No  Referring Physician: No referring provider defined for this encounter.  Chief complaint: Follow-up for asthma.  HPI: 44 year old with history of hypertension.  Admitted to [facility] long hospital on 12/29/2017 with exacerbation and status asthmaticus in the setting of rhinovirus infection.  She required intubation, paralysis.  Discharged on 9/16  Admitted for a day in the middle of February 2020 for minor exacerbation of asthma. Admitted to hospital from 10/12-10/14/2020 with asthma exacerbation improved with IV Solu-Medrol Admitted to the hospital from 10/15/2019-10/17/2019 with asthma exacerbation   Pets: Has 2 [place] pigs, no cats, dogs, birds or farm animals Occupation: Works in Financial trader support for [employer]: Reports that bathroom has a leak however no known mold, no hot tub, Jacuzzi Smoking history: Smokes 2 cigarettes a day, smokes marijuana, no vaping Travel history: No significant travel history Relevant family history: Mother has asthma  Interim history: She had frequent hospitalizations in the past due to noncompliance with therapy.  Has not kept up with office visits since November 2020  We had initiated paperwork for nucala in 2020 but she did not respond to calls on the telephone Ran out of inhalers in Fall 2021 with worsening symptoms.  She was seen by Buelah Manis in early December 2021 and given a prednisone taper and resume breo   Outpatient Encounter Medications as of 04/13/2020  Medication Sig  . albuterol (VENTOLIN HFA) 108 (90 Base) MCG/ACT inhaler Inhale 2 puffs into the lungs every 6 (six) hours as needed for wheezing or shortness of breath.  . famotidine (PEPCID) 20 MG tablet Take 1 tablet (20 mg total) by mouth 2 (two) times daily.  . fluticasone furoate-vilanterol (BREO ELLIPTA) 200-25 MCG/INH AEPB Inhale 1 puff into the lungs daily.  Marland Kitchen ipratropium-albuterol  (DUONEB) 0.5-2.5 (3) MG/3ML SOLN Take 3 mLs by nebulization every 4 (four) hours as needed.  . Multiple Vitamin (MULTIVITAMIN) tablet Take by mouth daily.  Marland Kitchen amLODipine (NORVASC) 10 MG tablet Take 1 tablet (10 mg total) by mouth daily. (Patient not taking: No sig reported)  . brompheniramine-pseudoephedrine-DM 30-2-10 MG/5ML syrup Take 1.25 mLs by mouth 4 (four) times daily as needed (cough). (Patient not taking: No sig reported)  . carvedilol (COREG) 3.125 MG tablet Take 1 tablet (3.125 mg total) by mouth 2 (two) times daily with a meal. (Patient not taking: No sig reported)  . [DISCONTINUED] Disposable Gloves (LATEX GLOVES LARGE) MISC Please provide patient 3 boxes of medicaid approved gloves (Patient not taking: Reported on 03/28/2020)  . [DISCONTINUED] escitalopram (LEXAPRO) 20 MG tablet Take 1 tablet (20 mg total) by mouth daily.  . [DISCONTINUED] ferrous sulfate 325 (65 FE) MG tablet Take 1 tablet (325 mg total) by mouth daily with breakfast.  . [DISCONTINUED] fluticasone furoate-vilanterol (BREO ELLIPTA) 200-25 MCG/INH AEPB Inhale 1 puff into the lungs daily.  . [DISCONTINUED] Masks MISC Please provide patient 3 boxes of medicaid approved face masks. (Patient not taking: Reported on 03/28/2020)  . [DISCONTINUED] traZODone (DESYREL) 100 MG tablet Take 1 tablet (100 mg total) by mouth at bedtime as needed for sleep.  . [DISCONTINUED] umeclidinium bromide (INCRUSE ELLIPTA) 62.5 MCG/INH AEPB Inhale 1 puff into the lungs daily. (Patient not taking: Reported on 03/28/2020)   No facility-administered encounter medications on file as of 04/13/2020.   Physical Exam: Blood pressure (!) 144/100, pulse (!) 119, temperature 98.6 F (37 C), temperature source Skin, height 5'  7" (1.702 m), weight 288 lb 6.4 oz (130.8 kg), SpO2 99 %. Gen:      No acute distress HEENT:  EOMI, sclera anicteric Neck:     No masses; no thyromegaly Lungs:    Clear to auscultation bilaterally; normal respiratory effort CV:          Regular rate and rhythm; no murmurs Abd:      + bowel sounds; soft, non-tender; no palpable masses, no distension Ext:    No edema; adequate peripheral perfusion Skin:      Warm and dry; no rash Neuro: alert and oriented x 3 Psych: normal mood and affect  Data Reviewed: Imaging: CTA 12/29/17- no pulmonary embolism in the main pulmonary arteries, mild nodular patchy opacities in the left apex.   Chest x-ray 02/02/2019-no active cardiopulmonary disease  Chest x-ray 10/15/2019-no active disease I have reviewed the images personally.  PFTs: 01/30/2018 FVC 3.22 [101%), FEV1 2.87 [110%), F/F 89, TLC 93%, DLCO 100% Normal test  FENO 01/09/18-5 FENO 03/01/2018-11.  Labs: CBC 12/29/2017-WBC 19.7, eos 9%, absolute eosinophil count 1773 CBC 02/02/2019-WBC 8.2, eos 5%, absolute eosinophil count 410  Blood allergy profile 12/30/2017-IgE 214, RAST panel shows sensitivity to dust mite, cat, dog, tree, grass pollen, mouse urine  ACT score 02/12/2019-6 ACT score 04/14/2019 1-7  Assessment:  Severe persistent asthma She has T2 high inflammation with recurrent exacerbations and hospitalization Complicated by noncompliance with inhalers, insurance issues.  She is due to start new insurance [employer] with her employment in January We will consolidate Breo and Incruse to Trelegy and give a sample to help until she gets her new insurance. Resume Singulair  Emphasized need for compliance She will likely need to be on biologics but will need to ensure that she is on a stable regimen with inhalers first. May need extra patient assistance even after she gets her new insurance.  Obesity Discussed that weight loss will lead to significant improvements in her breathing Advised diet and exercise with goal target of reducing 20 to 30 pounds.  GERD Continue Protonix.   On iron supplementation for iron deficiency anemia.    OSA Suspect uncontrolled OSA is playing a part in presentation She is scheduled for  split-night sleep study in 2020 but never completed. Reevaluate at return visit after she gets on her new insurance   Plan/Recommendations: - Start Trelegy 200, Singulair - Give samples of inhalers  Return to clinic in 2 to 4 weeks.  Marshell Garfinkel MD [facility] 04/13/2020, 10:35 AM  CC: No ref. provider found

## 2020-04-13 NOTE — Addendum Note (Signed)
Addended by: Elton Sin on: 04/13/2020 03:14 PM   Modules accepted: Orders

## 2020-05-05 DIAGNOSIS — U071 COVID-19: Secondary | ICD-10-CM

## 2020-05-06 ENCOUNTER — Telehealth: Payer: Self-pay

## 2020-05-06 NOTE — Telephone Encounter (Signed)
Called to discuss with patient about COVID-19 symptoms and the use of one of the available treatments for those with mild to moderate Covid symptoms and at a high risk of hospitalization.  Pt appears to qualify for outpatient treatment due to co-morbid conditions and/or a member of an at-risk group in accordance with the FDA Emergency Use Authorization.    Symptom onset: 05/02/20 Vaccinated: Yes Booster? No Immunocompromised? No Qualifiers: HTN,Asthma  Unable to reach pt - Left message and call back number [phone].   Marcello Moores

## 2020-05-20 IMAGING — CR DG CHEST 2V
2 series · 2 of 2 positions shown · non-contrast
Comparison: 06/08/2018

CLINICAL DATA: Shortness of breath, cough, chest tightness

EXAM:
CHEST - 2 VIEW

[w chest pa]
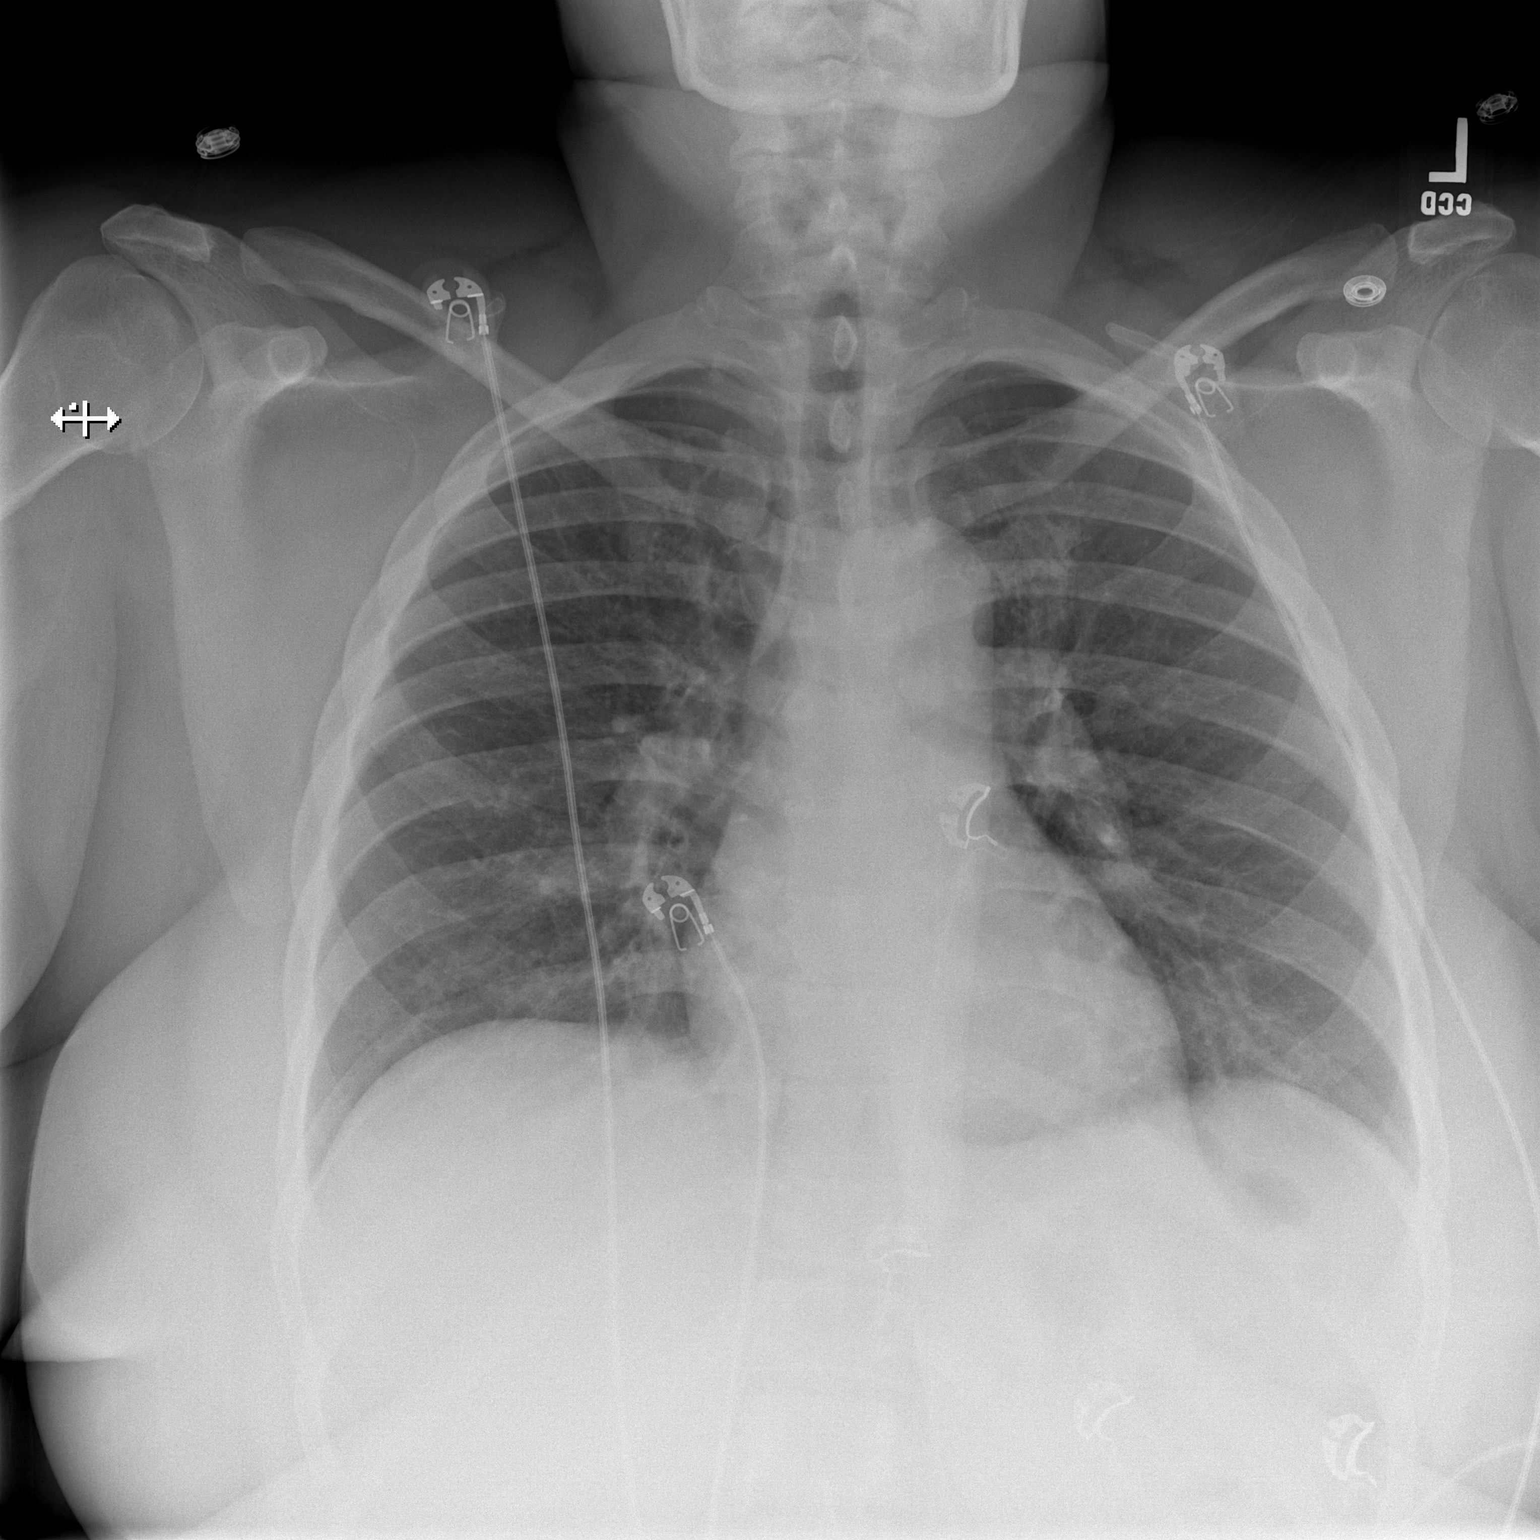

[w chest lat]
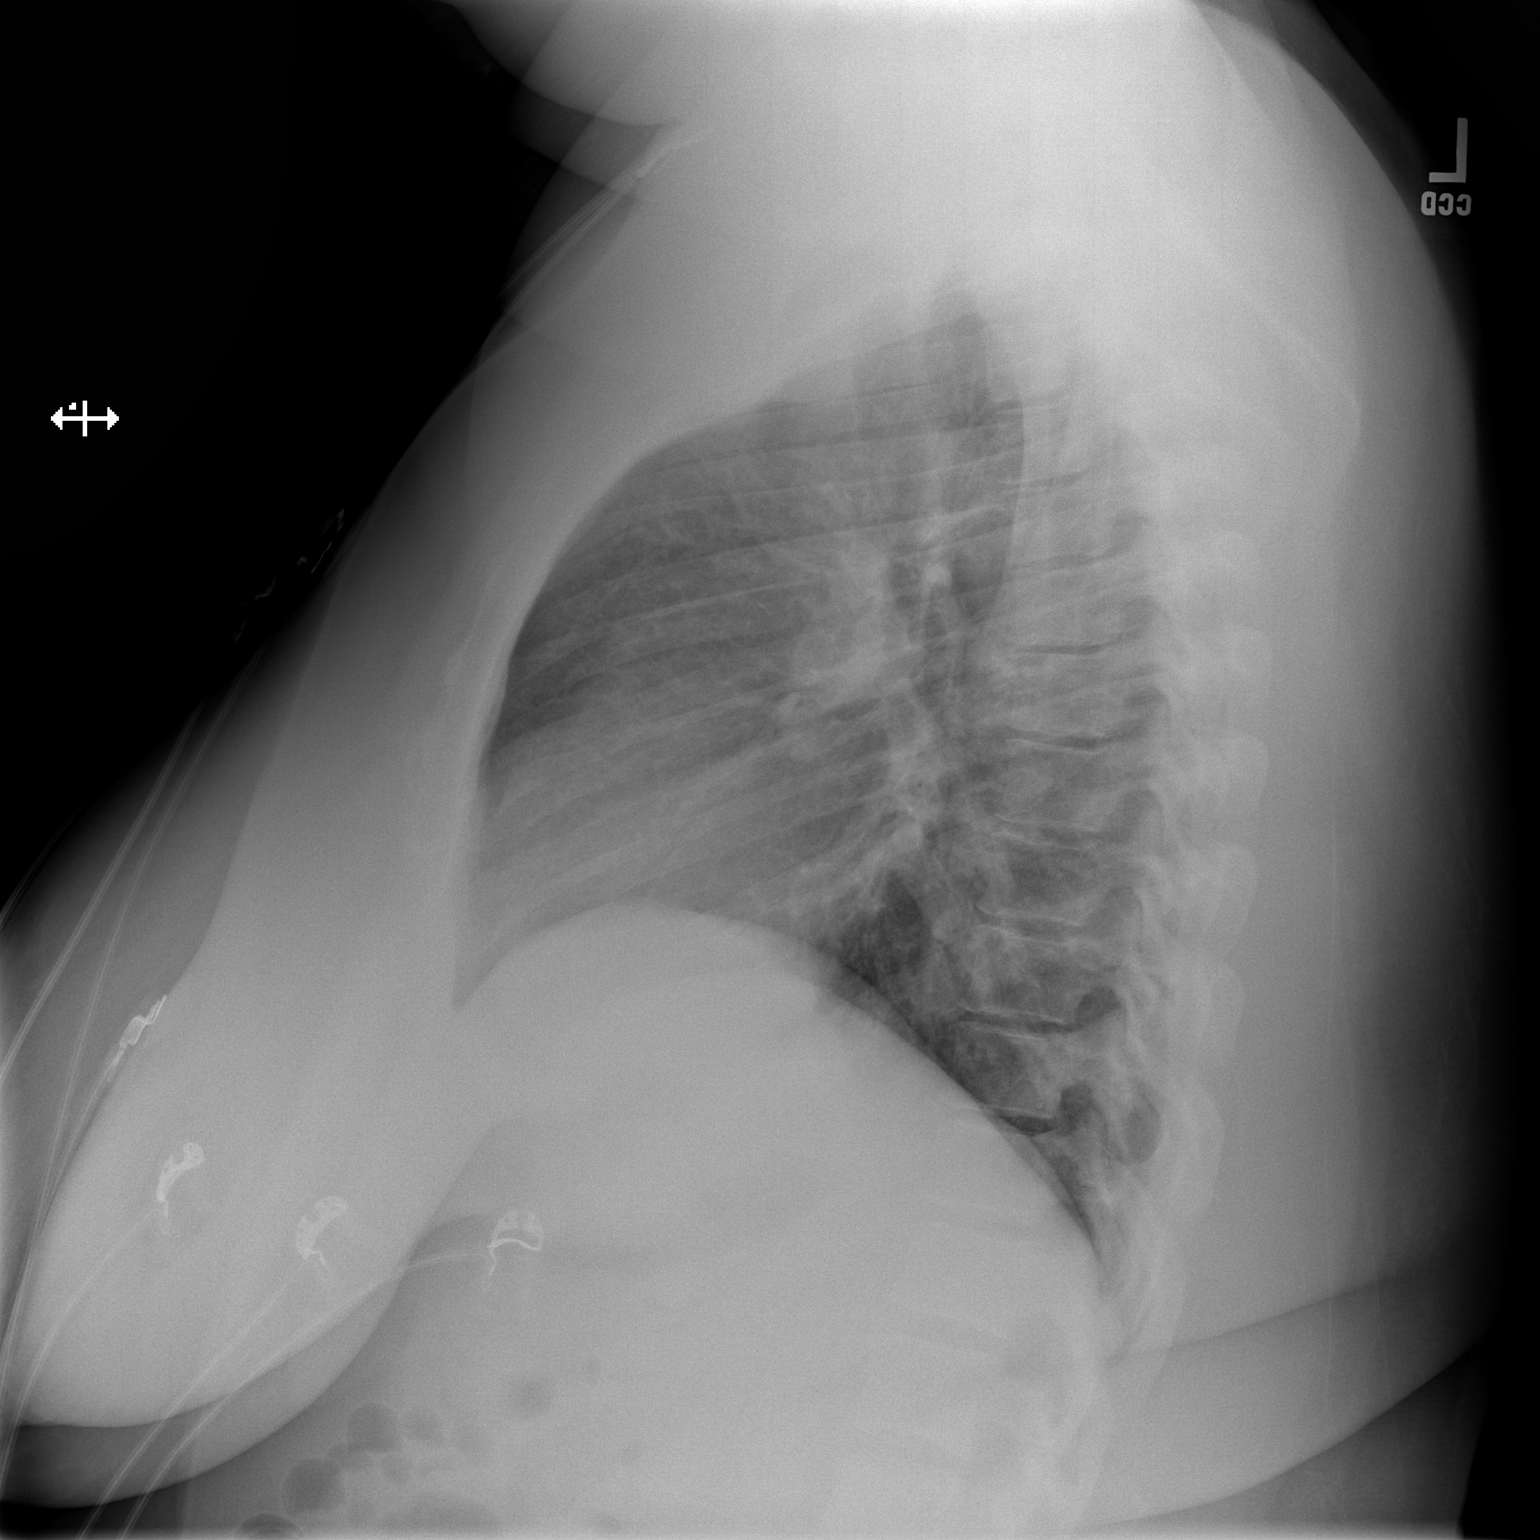

[2 of 2 positions shown; findings below may reference images not displayed]

FINDINGS: Heart and mediastinal contours are within normal limits. No focal
opacities or effusions. No acute bony abnormality.
IMPRESSION: No active cardiopulmonary disease.

## 2020-06-07 ENCOUNTER — Ambulatory Visit: Payer: Medicaid Other | Admitting: Pulmonary Disease

## 2020-07-04 ENCOUNTER — Other Ambulatory Visit: Payer: Self-pay

## 2020-07-04 ENCOUNTER — Encounter: Payer: Self-pay | Admitting: Pulmonary Disease

## 2020-07-04 ENCOUNTER — Ambulatory Visit (INDEPENDENT_AMBULATORY_CARE_PROVIDER_SITE_OTHER): Payer: Managed Care, Other (non HMO) | Admitting: Pulmonary Disease

## 2020-07-04 VITALS — BP 142/76 | HR 80 | Temp 97.6°F | Ht 67.0 in | Wt 287.8 lb

## 2020-07-04 DIAGNOSIS — J4550 Severe persistent asthma, uncomplicated: Secondary | ICD-10-CM

## 2020-07-04 MED ORDER — TRELEGY ELLIPTA 200-62.5-25 MCG/INH IN AEPB: 1.0000 | INHALATION_SPRAY | Freq: Every day | RESPIRATORY_TRACT | 2 refills | Status: DC

## 2020-07-04 NOTE — Progress Notes (Signed)
Jenna Stephens    [id number]    [birth year]  Primary Care Physician:Pcp, No  Referring Physician: No referring provider defined for this encounter.  Chief complaint: Follow-up for asthma.  HPI: 45 year old with history of hypertension.  Admitted to [facility] hospital on 12/29/2017 with exacerbation and status asthmaticus in the setting of rhinovirus infection.  She required intubation, paralysis.  Discharged on 9/16  Admitted for a day in the middle of February 2020 for minor exacerbation of asthma. Admitted to hospital from 10/12-10/14/2020 with asthma exacerbation improved with IV Solu-Medrol Admitted to the hospital from 10/15/2019-10/17/2019 with asthma exacerbation  She had frequent hospitalizations in the past due to noncompliance with therapy.  Has not kept up with office visits since November 2020  We had initiated paperwork for nucala in 2020 but she did not respond to calls on the telephone Ran out of inhalers in Fall 2021 with worsening symptoms.  She was seen by Derl Barrow in early December 2021 and given a prednisone taper and resumed breo  Pets: Has 2 [place] pigs, no cats, dogs, birds or farm animals Occupation: Works in Barista support for [employer]: Reports that bathroom has a leak however no known mold, no hot tub, Jacuzzi Smoking history: Smokes 2 cigarettes a day, smokes marijuana, no vaping Travel history: No significant travel history Relevant family history: Mother has asthma  Interim history: She has been more compliant with her inhaler.  Now on breo States that breathing is stable  Outpatient Encounter Medications as of 07/04/2020  Medication Sig  . albuterol (VENTOLIN HFA) 108 (90 Base) MCG/ACT inhaler Inhale 2 puffs into the lungs every 6 (six) hours as needed for wheezing or shortness of breath.  . Fluticasone-Umeclidin-Vilant (TRELEGY ELLIPTA) 200-62.5-25 MCG/INH AEPB Inhale 1 puff into the lungs daily.  Marland Kitchen ipratropium-albuterol  (DUONEB) 0.5-2.5 (3) MG/3ML SOLN Take 3 mLs by nebulization every 4 (four) hours as needed.  . montelukast (SINGULAIR) 10 MG tablet Take 1 tablet (10 mg total) by mouth at bedtime.  . Multiple Vitamin (MULTIVITAMIN) tablet Take by mouth daily.  . [DISCONTINUED] amLODipine (NORVASC) 10 MG tablet Take 1 tablet (10 mg total) by mouth daily. (Patient not taking: No sig reported)  . [DISCONTINUED] brompheniramine-pseudoephedrine-DM 30-2-10 MG/5ML syrup Take 1.25 mLs by mouth 4 (four) times daily as needed (cough). (Patient not taking: No sig reported)  . [DISCONTINUED] carvedilol (COREG) 3.125 MG tablet Take 1 tablet (3.125 mg total) by mouth 2 (two) times daily with a meal. (Patient not taking: No sig reported)  . [DISCONTINUED] famotidine (PEPCID) 20 MG tablet Take 1 tablet (20 mg total) by mouth 2 (two) times daily.  . [DISCONTINUED] Fluticasone-Umeclidin-Vilant (TRELEGY ELLIPTA) 200-62.5-25 MCG/INH AEPB Inhale 1 puff into the lungs daily.   No facility-administered encounter medications on file as of 07/04/2020.   Physical Exam: Blood pressure (!) 144/100, pulse (!) 119, temperature 98.6 F (37 C), temperature source Skin, height 5\' 7"  (1.702 m), weight 288 lb 6.4 oz (130.8 kg), SpO2 99 %. Gen:      No acute distress HEENT:  EOMI, sclera anicteric Neck:     No masses; no thyromegaly Lungs:    Clear to auscultation bilaterally; normal respiratory effort CV:         Regular rate and rhythm; no murmurs Abd:      + bowel sounds; soft, non-tender; no palpable masses, no distension Ext:    No edema; adequate peripheral perfusion Skin:  Warm and dry; no rash Neuro: alert and oriented x 3 Psych: normal mood and affect  Data Reviewed: Imaging: CTA 12/29/17- no pulmonary embolism in the main pulmonary arteries, mild nodular patchy opacities in the left apex.   Chest x-ray 02/02/2019-no active cardiopulmonary disease  Chest x-ray 10/15/2019-no active disease I have reviewed the images  personally.  PFTs: 01/30/2018 FVC 3.22 [101%), FEV1 2.87 [110%), F/F 89, TLC 93%, DLCO 100% Normal test  FENO 01/09/18-5 FENO 03/01/2018-11.  Labs: CBC 12/29/2017-WBC 19.7, eos 9%, absolute eosinophil count 1773 CBC 02/02/2019-WBC 8.2, eos 5%, absolute eosinophil count 410  Blood allergy profile 12/30/2017-IgE 214, RAST panel shows sensitivity to dust mite, cat, dog, tree, grass pollen, mouse urine  ACT score 02/12/2019-6 ACT score 04/13/2020-7 Act score 07/04/2020- 19  Assessment:  Severe persistent asthma She has T2 high inflammation with recurrent exacerbations and hospitalization Complicated by noncompliance with inhalers, insurance issues.  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  Continue breo in the meantime until she can get the new inhaler Continue Singulair  Emphasized need for compliance She will likely need to be on biologics but will need to ensure that she is on a stable regimen with inhalers first. May need extra patient assistance even after she gets her new insurance.  Obesity Discussed that weight loss will lead to significant improvements in her breathing Advised diet and exercise with goal target of reducing 20 to 30 pounds.  GERD Continue Protonix.   On iron supplementation for iron deficiency anemia.    OSA Suspect uncontrolled OSA is playing a part in presentation She is scheduled for split-night sleep study in 2020 but never completed. Reevaluate at return visit after she gets on her new insurance   Plan/Recommendations: - Start Trelegy 200, continue Singulair  Follow-up in 6 months  Marshell Garfinkel MD [facility] 07/04/2020, 9:48 AM  CC: No ref. provider found

## 2020-07-04 NOTE — Patient Instructions (Signed)
I am glad that you have no insurance now We will send in a prescription for trelegy.  Use this instead of the breo Continue Singulair Follow-up in 6 months.

## 2021-04-11 ENCOUNTER — Encounter: Payer: Self-pay | Admitting: Emergency Medicine

## 2021-04-11 ENCOUNTER — Ambulatory Visit
Admission: EM | Admit: 2021-04-11 | Discharge: 2021-04-11 | Disposition: A | Discharge status: Home / Self Care | Payer: Managed Care, Other (non HMO) | Attending: Physician Assistant | Admitting: Physician Assistant

## 2021-04-11 ENCOUNTER — Other Ambulatory Visit: Payer: Self-pay

## 2021-04-11 DIAGNOSIS — J45901 Unspecified asthma with (acute) exacerbation: Secondary | ICD-10-CM | POA: Diagnosis not present

## 2021-04-11 MED ORDER — PREDNISONE 20 MG PO TABS
40.0000 mg | ORAL_TABLET | Freq: Every day | ORAL | 0 refills | Status: AC
Start: 2021-04-11 — End: 2021-04-16

## 2021-04-11 MED ORDER — ALBUTEROL SULFATE HFA 108 (90 BASE) MCG/ACT IN AERS: 1.0000 | INHALATION_SPRAY | Freq: Four times a day (QID) | RESPIRATORY_TRACT | 0 refills | Status: DC | PRN

## 2021-04-11 NOTE — ED Provider Notes (Signed)
[facility]    CSN: [id number] Arrival date & time: 04/11/21  0840      History   Chief Complaint Chief Complaint  Patient presents with   Shortness of Breath    HPI Jenna Stephens is a 45 y.o. female.   Patient here today for evaluation of cough and headache that started 2 days ago.  She reports that she started develop chest tightness and some difficulty catching her breath.  She does have history of severe asthma exacerbations which have required hospitalization in the past.  She states she took her nebulizer treatment last night but did not seem to help much.  She does not have a refill of her albuterol inhaler at this time.  The history is provided by the patient.  Shortness of Breath Associated symptoms: cough, headaches and wheezing   Associated symptoms: no abdominal pain, no ear pain, no fever, no sore throat and no vomiting    Past Medical History:  Diagnosis Date   Acute respiratory failure ([place]) 12/29/2017   Anxiety    Asthma    Gestational diabetes    Hypertension    Iron deficiency anemia    Obesity     Patient Active Problem List   Diagnosis Date Noted   Asthma exacerbation 10/15/2019   Hypertensive urgency 10/15/2019   Sinus tachycardia 10/15/2019   Microcytic anemia 10/15/2019   Acute respiratory distress 02/02/2019   Grief at loss of child 11/06/2018   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   Gestational diabetes mellitus (GDM) affecting pregnancy, antepartum 09/05/2018   GERD (gastroesophageal reflux disease) 06/09/2018   Severe persistent asthma without complication 69/48/5462   HTN (hypertension) 12/29/2017   Obesity 12/29/2017    Past Surgical History:  Procedure Laterality Date   TONSILLECTOMY     TUBAL LIGATION N/A 10/07/2018   Procedure: POST PARTUM TUBAL LIGATION;  Surgeon: Truett Mainland, DO;  Location: [facility] ORS;  Service: Gynecology;  Laterality: N/A;    OB History     Gravida  6   Para  2   Term   2   Preterm  0   AB  3   Living  2      SAB  2   IAB  1   Ectopic  0   Multiple      Live Births  2            Home Medications    Prior to Admission medications   Medication Sig Start Date End Date Taking? Authorizing Provider  albuterol (VENTOLIN HFA) 108 (90 Base) MCG/ACT inhaler Inhale 1-2 puffs into the lungs every 6 (six) hours as needed for wheezing or shortness of breath. 04/11/21  Yes Francene Finders, PA-C  predniSONE (DELTASONE) 20 MG tablet Take 2 tablets (40 mg total) by mouth daily with breakfast for 5 days. 04/11/21 04/16/21 Yes Francene Finders, PA-C  albuterol (VENTOLIN HFA) 108 (90 Base) MCG/ACT inhaler Inhale 2 puffs into the lungs every 6 (six) hours as needed for wheezing or shortness of breath. 06/19/19   Mannam, Praveen, MD  Fluticasone-Umeclidin-Vilant (TRELEGY ELLIPTA) 200-62.5-25 MCG/INH AEPB Inhale 1 puff into the lungs daily. 07/04/20   Mannam, Hart Robinsons, MD  ipratropium-albuterol (DUONEB) 0.5-2.5 (3) MG/3ML SOLN Take 3 mLs by nebulization every 4 (four) hours as needed. 02/06/19   Martyn Ehrich, NP  montelukast (SINGULAIR) 10 MG tablet Take 1 tablet (10 mg total) by mouth at bedtime. 04/13/20   Marshell Garfinkel, MD  Multiple Vitamin (  MULTIVITAMIN) tablet Take by mouth daily.    [provider]    Family History Family History  Problem Relation Age of Onset   Diabetes Mother    Hypertension Mother    Hypertension Father    COPD Father     Social History Social History   Tobacco Use   Smoking status: Former    Packs/day: 0.25    Types: Cigarettes    Quit date: 01/06/2018    Years since quitting: 3.2   Smokeless tobacco: Never   Tobacco comments:    stopped smoking after this hospital admission  Vaping Use   Vaping Use: Never used  Substance Use Topics   Alcohol use: Not Currently    Comment: not during pregnancy   Drug use: Not Currently    Types: Marijuana    Comment: last smoked 09/26/2018     Allergies    Patient has no known allergies.   Review of Systems Review of Systems  Constitutional:  Negative for chills and fever.  HENT:  Positive for congestion. Negative for ear pain and sore throat.   Eyes:  Negative for discharge and redness.  Respiratory:  Positive for cough, shortness of breath and wheezing.   Gastrointestinal:  Negative for abdominal pain, diarrhea, nausea and vomiting.  Musculoskeletal:  Positive for back pain. Negative for myalgias.  Neurological:  Positive for headaches.    Physical Exam Triage Vital Signs ED Triage Vitals [04/11/21 0849]  Enc Vitals Group     BP (!) 177/114     Pulse Rate 84     Resp 16     Temp 98 F (36.7 C)     Temp Source Oral     SpO2 99 %     Weight      Height      Head Circumference      Peak Flow      Pain Score 7     Pain Loc      Pain Edu?      Excl. in [place]?    No data found.  Updated Vital Signs BP (!) 177/114 (BP Location: Left Arm)    Pulse 84    Temp 98 F (36.7 C) (Oral)    Resp 16    SpO2 99%      Physical Exam Vitals and nursing note reviewed.  Constitutional:      General: She is not in acute distress.    Appearance: Normal appearance. She is not ill-appearing.  HENT:     Head: Normocephalic and atraumatic.     Nose: Congestion present.     Mouth/Throat:     Mouth: Mucous membranes are moist.     Pharynx: No oropharyngeal exudate or posterior oropharyngeal erythema.  Eyes:     Conjunctiva/sclera: Conjunctivae normal.  Cardiovascular:     Rate and Rhythm: Normal rate and regular rhythm.     Heart sounds: Normal heart sounds. No murmur heard. Pulmonary:     Effort: Pulmonary effort is normal. No respiratory distress.     Breath sounds: Wheezing (rare) present. No rhonchi or rales.     Comments: Breath sounds somewhat diminished throughout. Skin:    General: Skin is warm and dry.  Neurological:     Mental Status: She is alert.  Psychiatric:        Mood and Affect: Mood normal.        Thought  Content: Thought content normal.     UC Treatments / Results  Labs (all  labs ordered are listed, but only abnormal results are displayed) Labs Reviewed - No data to display  EKG   Radiology No results found.  Procedures Procedures (including critical care time)  Medications Ordered in UC Medications - No data to display  Initial Impression / Assessment and Plan / UC Course  I have reviewed the triage vital signs and the nursing notes.  Pertinent labs & imaging results that were available during my care of the patient were reviewed by me and considered in my medical decision making (see chart for details).    Suspect likely early asthma exacerbation will treat with steroid burst.  Albuterol also refilled.  Vital stable in office and EKG with normal sinus rhythm.  With patient's history strong precaution given to report to ED with any worsening symptoms. Patient expresses understanding.   Final Clinical Impressions(s) / UC Diagnoses   Final diagnoses:  Asthma with acute exacerbation, unspecified asthma severity, unspecified whether persistent   Discharge Instructions   None    ED Prescriptions     Medication Sig Dispense Auth. Provider   predniSONE (DELTASONE) 20 MG tablet Take 2 tablets (40 mg total) by mouth daily with breakfast for 5 days. 10 tablet Ewell Poe F, PA-C   albuterol (VENTOLIN HFA) 108 (90 Base) MCG/ACT inhaler Inhale 1-2 puffs into the lungs every 6 (six) hours as needed for wheezing or shortness of breath. 8 g Francene Finders, PA-C      PDMP not reviewed this encounter.   Francene Finders, PA-C 04/11/21 [phone]

## 2021-04-11 NOTE — ED Triage Notes (Signed)
Cough, headache starting 2 days ago. Progressed to chest tightness, difficulty catching breath. Hx of severe asthma exacerbations. Last took neb treatment at home last night, states it didn't help her symptoms much.

## 2021-06-27 ENCOUNTER — Encounter: Payer: Self-pay | Admitting: Internal Medicine

## 2021-06-27 ENCOUNTER — Telehealth: Payer: Self-pay

## 2021-06-27 ENCOUNTER — Other Ambulatory Visit (INDEPENDENT_AMBULATORY_CARE_PROVIDER_SITE_OTHER): Payer: BC Managed Care – PPO

## 2021-06-27 ENCOUNTER — Other Ambulatory Visit: Payer: Self-pay

## 2021-06-27 ENCOUNTER — Ambulatory Visit (INDEPENDENT_AMBULATORY_CARE_PROVIDER_SITE_OTHER): Payer: BC Managed Care – PPO | Admitting: Internal Medicine

## 2021-06-27 VITALS — BP 166/116 | HR 99 | Temp 97.9°F | Resp 16 | Ht 67.0 in | Wt 286.0 lb

## 2021-06-27 DIAGNOSIS — E119 Type 2 diabetes mellitus without complications: Secondary | ICD-10-CM

## 2021-06-27 DIAGNOSIS — I10 Essential (primary) hypertension: Secondary | ICD-10-CM | POA: Diagnosis not present

## 2021-06-27 DIAGNOSIS — Z124 Encounter for screening for malignant neoplasm of cervix: Secondary | ICD-10-CM

## 2021-06-27 DIAGNOSIS — Z0000 Encounter for general adult medical examination without abnormal findings: Secondary | ICD-10-CM | POA: Diagnosis not present

## 2021-06-27 DIAGNOSIS — E785 Hyperlipidemia, unspecified: Secondary | ICD-10-CM | POA: Diagnosis not present

## 2021-06-27 DIAGNOSIS — Z0001 Encounter for general adult medical examination with abnormal findings: Secondary | ICD-10-CM

## 2021-06-27 DIAGNOSIS — D539 Nutritional anemia, unspecified: Secondary | ICD-10-CM

## 2021-06-27 DIAGNOSIS — Z23 Encounter for immunization: Secondary | ICD-10-CM

## 2021-06-27 DIAGNOSIS — K2100 Gastro-esophageal reflux disease with esophagitis, without bleeding: Secondary | ICD-10-CM

## 2021-06-27 DIAGNOSIS — F411 Generalized anxiety disorder: Secondary | ICD-10-CM | POA: Diagnosis not present

## 2021-06-27 DIAGNOSIS — R0683 Snoring: Secondary | ICD-10-CM

## 2021-06-27 DIAGNOSIS — J4550 Severe persistent asthma, uncomplicated: Secondary | ICD-10-CM

## 2021-06-27 DIAGNOSIS — D500 Iron deficiency anemia secondary to blood loss (chronic): Secondary | ICD-10-CM

## 2021-06-27 DIAGNOSIS — Z1231 Encounter for screening mammogram for malignant neoplasm of breast: Secondary | ICD-10-CM

## 2021-06-27 LAB — URINALYSIS, ROUTINE W REFLEX MICROSCOPIC
Bilirubin Urine: NEGATIVE
Hgb urine dipstick: NEGATIVE
Ketones, ur: NEGATIVE
Leukocytes,Ua: NEGATIVE
Nitrite: NEGATIVE
RBC / HPF: NONE SEEN (ref 0–?)
Specific Gravity, Urine: 1.015 (ref 1.000–1.030)
Total Protein, Urine: NEGATIVE
Urine Glucose: NEGATIVE
Urobilinogen, UA: 0.2 (ref 0.0–1.0)
WBC, UA: NONE SEEN (ref 0–?)
pH: 6.5 (ref 5.0–8.0)

## 2021-06-27 LAB — CBC WITH DIFFERENTIAL/PLATELET
Basophils Absolute: 0.1 10*3/uL (ref 0.0–0.1)
Basophils Relative: 1.3 % (ref 0.0–3.0)
Eosinophils Absolute: 0.1 10*3/uL (ref 0.0–0.7)
Eosinophils Relative: 1.3 % (ref 0.0–5.0)
HCT: 34.9 % — ABNORMAL LOW (ref 36.0–46.0)
Hemoglobin: 10.6 g/dL — ABNORMAL LOW (ref 12.0–15.0)
Lymphocytes Relative: 21.8 % (ref 12.0–46.0)
Lymphs Abs: 2.5 10*3/uL (ref 0.7–4.0)
MCHC: 30.5 g/dL (ref 30.0–36.0)
MCV: 67.7 fl — ABNORMAL LOW (ref 78.0–100.0)
Monocytes Absolute: 0.7 10*3/uL (ref 0.1–1.0)
Monocytes Relative: 5.8 % (ref 3.0–12.0)
Neutro Abs: 7.9 10*3/uL — ABNORMAL HIGH (ref 1.4–7.7)
Neutrophils Relative %: 69.8 % (ref 43.0–77.0)
Platelets: 275 10*3/uL (ref 150.0–400.0)
RBC: 5.16 Mil/uL — ABNORMAL HIGH (ref 3.87–5.11)
RDW: 17.6 % — ABNORMAL HIGH (ref 11.5–15.5)
WBC: 11.3 10*3/uL — ABNORMAL HIGH (ref 4.0–10.5)

## 2021-06-27 LAB — BASIC METABOLIC PANEL
BUN: 10 mg/dL (ref 6–23)
CO2: 25 mEq/L (ref 19–32)
Calcium: 9.2 mg/dL (ref 8.4–10.5)
Chloride: 103 mEq/L (ref 96–112)
Creatinine, Ser: 0.72 mg/dL (ref 0.40–1.20)
GFR: 102.31 mL/min (ref >60.00)
Glucose, Bld: 123 mg/dL — ABNORMAL HIGH (ref 70–99)
Potassium: 3.5 mEq/L (ref 3.5–5.1)
Sodium: 137 mEq/L (ref 135–145)

## 2021-06-27 LAB — LIPID PANEL
Cholesterol: 227 mg/dL — ABNORMAL HIGH (ref 0–200)
HDL: 56.6 mg/dL (ref >39.00)
LDL Cholesterol: 136 mg/dL — ABNORMAL HIGH (ref 0–99)
NonHDL: 170.15
Total CHOL/HDL Ratio: 4
Triglycerides: 171 mg/dL — ABNORMAL HIGH (ref 0.0–149.0)
VLDL: 34.2 mg/dL (ref 0.0–40.0)

## 2021-06-27 LAB — FOLATE: Folate: 7 ng/mL (ref >5.9)

## 2021-06-27 LAB — VITAMIN B12: Vitamin B-12: 326 pg/mL (ref 211–911)

## 2021-06-27 LAB — IBC + FERRITIN
Ferritin: 4.1 ng/mL — ABNORMAL LOW (ref 10.0–291.0)
Iron: 37 ug/dL — ABNORMAL LOW (ref 42–145)
Saturation Ratios: 7.2 % — ABNORMAL LOW (ref 20.0–50.0)
TIBC: 516.6 ug/dL — ABNORMAL HIGH (ref 250.0–450.0)
Transferrin: 369 mg/dL — ABNORMAL HIGH (ref 212.0–360.0)

## 2021-06-27 LAB — MICROALBUMIN / CREATININE URINE RATIO
Creatinine,U: 116.9 mg/dL
Microalb Creat Ratio: 0.6 mg/g (ref 0.0–30.0)
Microalb, Ur: <0.7 mg/dL (ref 0.0–1.9)

## 2021-06-27 LAB — HEPATIC FUNCTION PANEL
ALT: 11 U/L (ref 0–35)
AST: 11 U/L (ref 0–37)
Albumin: 4.1 g/dL (ref 3.5–5.2)
Alkaline Phosphatase: 64 U/L (ref 39–117)
Bilirubin, Direct: 0 mg/dL (ref 0.0–0.3)
Total Bilirubin: 0.4 mg/dL (ref 0.2–1.2)
Total Protein: 7.3 g/dL (ref 6.0–8.3)

## 2021-06-27 LAB — HEMOGLOBIN A1C: Hgb A1c MFr Bld: 5.8 % (ref 4.6–6.5)

## 2021-06-27 LAB — TSH: TSH: 1.57 u[IU]/mL (ref 0.35–5.50)

## 2021-06-27 MED ORDER — DULOXETINE HCL 30 MG PO CPEP: 30.0000 mg | ORAL_CAPSULE | Freq: Every day | ORAL | 0 refills | Status: DC

## 2021-06-27 MED ORDER — TRELEGY ELLIPTA 100-62.5-25 MCG/ACT IN AEPB
1.0000 | INHALATION_SPRAY | Freq: Every day | RESPIRATORY_TRACT | 0 refills | Status: DC
Start: 2021-06-27 — End: 2021-09-13

## 2021-06-27 MED ORDER — NEBIVOLOL HCL 10 MG PO TABS: 10.0000 mg | ORAL_TABLET | Freq: Every day | ORAL | 0 refills | Status: DC

## 2021-06-27 MED ORDER — TRIAMTERENE-HCTZ 37.5-25 MG PO CAPS: 1.0000 | ORAL_CAPSULE | Freq: Every day | ORAL | 0 refills | Status: DC

## 2021-06-27 MED ORDER — CLONAZEPAM 0.5 MG PO TABS: 0.5000 mg | ORAL_TABLET | Freq: Three times a day (TID) | ORAL | 1 refills | Status: DC | PRN

## 2021-06-27 NOTE — Telephone Encounter (Signed)
-----   Message from Janith Lima, MD sent at 06/27/2021 12:02 PM EST ----- ?Regarding: anemia ?Please let her know that her anemia has worsened ?Ask her to come back in to the lab for some vitamin levels ? ? ? ?

## 2021-06-27 NOTE — Progress Notes (Signed)
Subjective:  Patient ID: Jenna Stephens, female    DOB: Aug 24, 1975  Age: 46 y.o. MRN: [id number]  CC: Annual Exam, Hypertension, Diabetes, and Anemia  This visit occurred during the SARS-CoV-2 public health emergency.  Safety protocols were in place, including screening questions prior to the visit, additional usage of staff PPE, and extensive cleaning of exam room while observing appropriate contact time as indicated for disinfecting solutions.    HPI Jenna Stephens presents for a CPX and to establish.  She complains of elevated blood pressure with headache, dizzy spells, and anxiety.  She feels like she has episodes of panic that prevent her from leaving her home.  She complains of insomnia and anhedonia.  She has mild intermittent wheezing but denies chest pain, shortness of breath, dyspnea on exertion, or diaphoresis.  History Jenna Stephens has a past medical history of Acute respiratory failure ([place]) (12/29/2017), Anxiety, Asthma, Gestational diabetes, Hypertension, Iron deficiency anemia, and Obesity.   She has a past surgical history that includes Tonsillectomy and Tubal ligation (N/A, 10/07/2018).   Her family history includes COPD in her father; Diabetes in her mother; Hypertension in her father and mother.She reports that she quit smoking about 3 years ago. Her smoking use included cigarettes. She smoked an average of .25 packs per day. She has never used smokeless tobacco. She reports that she does not currently use alcohol. She reports that she does not currently use drugs after having used the following drugs: Marijuana.  Outpatient Medications Prior to Visit  Medication Sig Dispense Refill   albuterol (VENTOLIN HFA) 108 (90 Base) MCG/ACT inhaler Inhale 2 puffs into the lungs every 6 (six) hours as needed for wheezing or shortness of breath. 8 g 0   albuterol (VENTOLIN HFA) 108 (90 Base) MCG/ACT inhaler Inhale 1-2 puffs into the lungs every 6 (six) hours as needed for wheezing or shortness  of breath. 8 g 0   ipratropium-albuterol (DUONEB) 0.5-2.5 (3) MG/3ML SOLN Take 3 mLs by nebulization every 4 (four) hours as needed. 360 mL 1   montelukast (SINGULAIR) 10 MG tablet Take 1 tablet (10 mg total) by mouth at bedtime. 30 tablet 5   Multiple Vitamin (MULTIVITAMIN) tablet Take by mouth daily.     Fluticasone-Umeclidin-Vilant (TRELEGY ELLIPTA) 200-62.5-25 MCG/INH AEPB Inhale 1 puff into the lungs daily. 60 each 2   No facility-administered medications prior to visit.    ROS Review of Systems  Constitutional:  Negative for chills, diaphoresis, fatigue, fever and unexpected weight change.  HENT:  Negative for sore throat and voice change.   Eyes:  Negative for pain and visual disturbance.  Respiratory:  Positive for wheezing. Negative for apnea, cough, chest tightness and shortness of breath.        ++ snoring  Cardiovascular:  Negative for chest pain, palpitations and leg swelling.  Gastrointestinal:  Negative for abdominal pain, constipation, diarrhea, nausea and vomiting.       ++heartburn  Genitourinary: Negative.  Negative for difficulty urinating, dysuria and hematuria.  Musculoskeletal: Negative.   Skin: Negative.   Neurological:  Positive for dizziness and headaches. Negative for weakness, light-headedness and numbness.  Hematological:  Negative for adenopathy. Does not bruise/bleed easily.  Psychiatric/Behavioral:  Positive for dysphoric mood and sleep disturbance. Negative for confusion, decreased concentration and suicidal ideas. The patient is nervous/anxious.    Objective:  BP (!) 166/116 (BP Location: Right Arm, Patient Position: Sitting, Cuff Size: Large) Comment: thigh cuff   Pulse 99    Temp 97.9 F (36.6  C) (Oral)    Resp 16    Ht '5\' 7"'$  (1.702 m)    Wt 286 lb (129.7 kg)    SpO2 100%    BMI 44.79 kg/m   Physical Exam Vitals reviewed.  Constitutional:      General: She is not in acute distress.    Appearance: She is obese. She is not toxic-appearing or  diaphoretic.  HENT:     Nose: Nose normal.     Mouth/Throat:     Mouth: Mucous membranes are moist.  Eyes:     General: No scleral icterus.    Conjunctiva/sclera: Conjunctivae normal.  Cardiovascular:     Rate and Rhythm: Normal rate and regular rhythm.     Heart sounds: No murmur heard.   No friction rub. No gallop.     Comments: EKG- NSR, 91 bpm ??LAE No LVH or Q waves Pulmonary:     Effort: Pulmonary effort is normal.     Breath sounds: No stridor. No wheezing, rhonchi or rales.  Abdominal:     General: Abdomen is protuberant. Bowel sounds are normal. There is no distension.     Palpations: Abdomen is soft. There is no hepatomegaly, splenomegaly or mass.     Tenderness: There is no abdominal tenderness. There is no guarding.  Musculoskeletal:        General: Normal range of motion.     Cervical back: Neck supple.     Right lower leg: No edema.     Left lower leg: No edema.  Lymphadenopathy:     Cervical: No cervical adenopathy.  Skin:    General: Skin is warm and dry.     Findings: No rash.  Neurological:     General: No focal deficit present.     Mental Status: Mental status is at baseline.  Psychiatric:        Mood and Affect: Mood normal.        Behavior: Behavior normal.    Lab Results  Component Value Date   WBC 11.3 (H) 06/27/2021   HGB 10.6 (L) 06/27/2021   HCT 34.9 (L) 06/27/2021   PLT 275.0 06/27/2021   GLUCOSE 123 (H) 06/27/2021   CHOL 227 (H) 06/27/2021   TRIG 171.0 (H) 06/27/2021   HDL 56.60 06/27/2021   LDLCALC 136 (H) 06/27/2021   ALT 11 06/27/2021   AST 11 06/27/2021   NA 137 06/27/2021   K 3.5 06/27/2021   CL 103 06/27/2021   CREATININE 0.72 06/27/2021   BUN 10 06/27/2021   CO2 25 06/27/2021   TSH 1.57 06/27/2021   INR 0.99 11/28/2017   HGBA1C 5.8 06/27/2021   MICROALBUR <0.7 06/27/2021     Assessment & Plan:   Jenna Stephens was seen today for annual exam, hypertension, diabetes and anemia.  Diagnoses and all orders for this  visit:  Encounter for general adult medical examination with abnormal findings-exam completed, labs reviewed, vaccines reviewed and updated, cancer screenings addressed, patient education was given.  Malignant hypertension- Her EKG is reassuring.  Labs are negative for secondary causes or endorgan damage.  Will treat this with triamterene, hydrochlorothiazide, and nebivolol. -     Basic metabolic panel; Future -     Aldosterone + renin activity w/ ratio; Future -     TSH; Future -     Urinalysis, Routine w reflex microscopic; Future -     Hepatic function panel; Future -     CBC with Differential/Platelet; Future -     EKG 12-Lead -  triamterene-hydrochlorothiazide (DYAZIDE) 37.5-25 MG capsule; Take 1 each (1 capsule total) by mouth daily. -     nebivolol (BYSTOLIC) 10 MG tablet; Take 1 tablet (10 mg total) by mouth daily.  Type 2 diabetes mellitus without complication, without long-term current use of insulin ([place])- Her A1c is at 5.9%.  She is prediabetic. -     Basic metabolic panel; Future -     Urinalysis, Routine w reflex microscopic; Future -     Hemoglobin A1c; Future -     Microalbumin / creatinine urine ratio; Future -     C-peptide; Future -     Ambulatory referral to Ophthalmology  Hyperlipidemia LDL goal <100- Statin therapy is not indicated. -     Lipid panel; Future -     TSH; Future -     Hepatic function panel; Future  GAD (generalized anxiety disorder) -     TSH; Future -     clonazePAM (KLONOPIN) 0.5 MG tablet; Take 1 tablet (0.5 mg total) by mouth 3 (three) times daily as needed for anxiety. -     DULoxetine (CYMBALTA) 30 MG capsule; Take 1 capsule (30 mg total) by mouth daily.  Visit for screening mammogram -     MM DIGITAL SCREENING BILATERAL; Future  Screening for cervical cancer -     Ambulatory referral to Gynecology  Loud snoring -     Ambulatory referral to Pulmonology  Need for vaccination -     Pneumococcal conjugate vaccine  20-valent  Deficiency anemia- Will screen for vitamin deficiencies. -     IBC + Ferritin; Future -     Vitamin B12; Future -     Vitamin B1; Future -     Zinc; Future -     Folate; Future -     Reticulocytes; Future -     Reticulocytes -     Folate -     Zinc -     Vitamin B1 -     Vitamin B12 -     IBC + Ferritin  Severe persistent asthma without complication -     Fluticasone-Umeclidin-Vilant (TRELEGY ELLIPTA) 100-62.5-25 MCG/ACT AEPB; Inhale 1 puff into the lungs daily.  Iron deficiency anemia due to chronic blood loss -     Ferric Maltol (ACCRUFER) 30 MG CAPS; Take 1 capsule by mouth in the morning and at bedtime.  Gastroesophageal reflux disease with esophagitis without hemorrhage- Will start a PPI -     esomeprazole (NEXIUM) 40 MG capsule; Take 1 capsule (40 mg total) by mouth daily at 12 noon.   I have discontinued Tywanna S. Brasington's Trelegy Ellipta. I am also having her start on clonazePAM, DULoxetine, Trelegy Ellipta, triamterene-hydrochlorothiazide, nebivolol, ACCRUFeR, and esomeprazole. Additionally, I am having her maintain her ipratropium-albuterol, albuterol, multivitamin, montelukast, and albuterol.  Meds ordered this encounter  Medications   clonazePAM (KLONOPIN) 0.5 MG tablet    Sig: Take 1 tablet (0.5 mg total) by mouth 3 (three) times daily as needed for anxiety.    Dispense:  90 tablet    Refill:  1   DULoxetine (CYMBALTA) 30 MG capsule    Sig: Take 1 capsule (30 mg total) by mouth daily.    Dispense:  30 capsule    Refill:  0   Fluticasone-Umeclidin-Vilant (TRELEGY ELLIPTA) 100-62.5-25 MCG/ACT AEPB    Sig: Inhale 1 puff into the lungs daily.    Dispense:  120 each    Refill:  0   triamterene-hydrochlorothiazide (DYAZIDE) 37.5-25 MG capsule  Sig: Take 1 each (1 capsule total) by mouth daily.    Dispense:  90 capsule    Refill:  0   nebivolol (BYSTOLIC) 10 MG tablet    Sig: Take 1 tablet (10 mg total) by mouth daily.    Dispense:  90 tablet     Refill:  0   Ferric Maltol (ACCRUFER) 30 MG CAPS    Sig: Take 1 capsule by mouth in the morning and at bedtime.    Dispense:  180 capsule    Refill:  1   esomeprazole (NEXIUM) 40 MG capsule    Sig: Take 1 capsule (40 mg total) by mouth daily at 12 noon.    Dispense:  90 capsule    Refill:  0     Follow-up: Return in about 4 weeks (around 07/25/2021).  Scarlette Calico, MD

## 2021-06-27 NOTE — Telephone Encounter (Signed)
Pt has been informed and would come back at her earliest convenience to have additional labs drawn. She stated she may be able to come back again today. ? ?  ?

## 2021-06-27 NOTE — Patient Instructions (Signed)

## 2021-06-28 ENCOUNTER — Encounter: Payer: Self-pay | Admitting: Internal Medicine

## 2021-06-28 DIAGNOSIS — D500 Iron deficiency anemia secondary to blood loss (chronic): Secondary | ICD-10-CM | POA: Insufficient documentation

## 2021-06-28 DIAGNOSIS — K2100 Gastro-esophageal reflux disease with esophagitis, without bleeding: Secondary | ICD-10-CM | POA: Insufficient documentation

## 2021-06-28 MED ORDER — ACCRUFER 30 MG PO CAPS
1.0000 | ORAL_CAPSULE | Freq: Two times a day (BID) | ORAL | 1 refills | Status: DC
Start: 2021-06-28 — End: 2021-06-29

## 2021-06-28 MED ORDER — ESOMEPRAZOLE MAGNESIUM 40 MG PO CPDR: 40.0000 mg | DELAYED_RELEASE_CAPSULE | Freq: Every day | ORAL | 0 refills | Status: DC

## 2021-06-29 ENCOUNTER — Other Ambulatory Visit: Payer: Self-pay | Admitting: Internal Medicine

## 2021-06-29 ENCOUNTER — Encounter: Payer: Self-pay | Admitting: Internal Medicine

## 2021-06-29 DIAGNOSIS — K2100 Gastro-esophageal reflux disease with esophagitis, without bleeding: Secondary | ICD-10-CM

## 2021-06-29 MED ORDER — POLYSACCHARIDE IRON COMPLEX 150 MG PO CAPS: 150.0000 mg | ORAL_CAPSULE | Freq: Every day | ORAL | 1 refills | Status: DC

## 2021-06-29 MED ORDER — ESOMEPRAZOLE MAGNESIUM 40 MG PO CPDR: 40.0000 mg | DELAYED_RELEASE_CAPSULE | Freq: Every day | ORAL | 0 refills | Status: DC

## 2021-06-30 ENCOUNTER — Encounter: Payer: Self-pay | Admitting: Internal Medicine

## 2021-06-30 LAB — VITAMIN B1: Vitamin B1 (Thiamine): 9 nmol/L (ref 8–30)

## 2021-06-30 LAB — RETICULOCYTES
ABS Retic: 104580 cells/uL — ABNORMAL HIGH (ref 20000–80000)
Retic Ct Pct: 2.1 %

## 2021-06-30 LAB — ZINC: Zinc: 63 ug/dL (ref 60–130)

## 2021-07-04 LAB — ALDOSTERONE + RENIN ACTIVITY W/ RATIO
ALDO / PRA Ratio: 11.5 Ratio (ref 0.9–28.9)
Aldosterone: 6 ng/dL
Renin Activity: 0.52 ng/mL/h (ref 0.25–5.82)

## 2021-07-04 LAB — C-PEPTIDE: C-Peptide: 1.34 ng/mL (ref 0.80–3.85)

## 2021-07-14 ENCOUNTER — Encounter: Payer: Self-pay | Admitting: Internal Medicine

## 2021-08-21 ENCOUNTER — Ambulatory Visit: Payer: Managed Care, Other (non HMO) | Admitting: Obstetrics and Gynecology

## 2021-08-24 ENCOUNTER — Ambulatory Visit
Admission: RE | Admit: 2021-08-24 | Discharge: 2021-08-24 | Disposition: A | Discharge status: Home / Self Care | Payer: BC Managed Care – PPO | Source: Ambulatory Visit | Attending: Internal Medicine | Admitting: Internal Medicine

## 2021-08-24 DIAGNOSIS — Z1231 Encounter for screening mammogram for malignant neoplasm of breast: Secondary | ICD-10-CM | POA: Diagnosis not present

## 2021-09-13 ENCOUNTER — Ambulatory Visit (INDEPENDENT_AMBULATORY_CARE_PROVIDER_SITE_OTHER): Payer: BC Managed Care – PPO | Admitting: Internal Medicine

## 2021-09-13 ENCOUNTER — Encounter: Payer: Self-pay | Admitting: Internal Medicine

## 2021-09-13 VITALS — BP 136/88 | HR 85 | Temp 97.9°F | Resp 16 | Ht 67.0 in | Wt 277.0 lb

## 2021-09-13 DIAGNOSIS — J4550 Severe persistent asthma, uncomplicated: Secondary | ICD-10-CM | POA: Diagnosis not present

## 2021-09-13 DIAGNOSIS — I10 Essential (primary) hypertension: Secondary | ICD-10-CM | POA: Diagnosis not present

## 2021-09-13 DIAGNOSIS — F411 Generalized anxiety disorder: Secondary | ICD-10-CM | POA: Diagnosis not present

## 2021-09-13 DIAGNOSIS — D500 Iron deficiency anemia secondary to blood loss (chronic): Secondary | ICD-10-CM | POA: Diagnosis not present

## 2021-09-13 DIAGNOSIS — F331 Major depressive disorder, recurrent, moderate: Secondary | ICD-10-CM

## 2021-09-13 LAB — IBC + FERRITIN
Ferritin: 2.9 ng/mL — ABNORMAL LOW (ref 10.0–291.0)
Iron: 21 ug/dL — ABNORMAL LOW (ref 42–145)
Saturation Ratios: 3.9 % — ABNORMAL LOW (ref 20.0–50.0)
TIBC: 543.2 ug/dL — ABNORMAL HIGH (ref 250.0–450.0)
Transferrin: 388 mg/dL — ABNORMAL HIGH (ref 212.0–360.0)

## 2021-09-13 LAB — CBC WITH DIFFERENTIAL/PLATELET
Basophils Absolute: 0.1 10*3/uL (ref 0.0–0.1)
Basophils Relative: 0.7 % (ref 0.0–3.0)
Eosinophils Absolute: 0.2 10*3/uL (ref 0.0–0.7)
Eosinophils Relative: 1.9 % (ref 0.0–5.0)
HCT: 34.7 % — ABNORMAL LOW (ref 36.0–46.0)
Hemoglobin: 10.5 g/dL — ABNORMAL LOW (ref 12.0–15.0)
Lymphocytes Relative: 20.2 % (ref 12.0–46.0)
Lymphs Abs: 2.2 10*3/uL (ref 0.7–4.0)
MCHC: 30.3 g/dL (ref 30.0–36.0)
MCV: 68.6 fl — ABNORMAL LOW (ref 78.0–100.0)
Monocytes Absolute: 0.7 10*3/uL (ref 0.1–1.0)
Monocytes Relative: 6.5 % (ref 3.0–12.0)
Neutro Abs: 7.9 10*3/uL — ABNORMAL HIGH (ref 1.4–7.7)
Neutrophils Relative %: 70.7 % (ref 43.0–77.0)
Platelets: 350 10*3/uL (ref 150.0–400.0)
RBC: 5.05 Mil/uL (ref 3.87–5.11)
RDW: 18.2 % — ABNORMAL HIGH (ref 11.5–15.5)
WBC: 11.1 10*3/uL — ABNORMAL HIGH (ref 4.0–10.5)

## 2021-09-13 MED ORDER — NEBIVOLOL HCL 10 MG PO TABS: 10.0000 mg | ORAL_TABLET | Freq: Every day | ORAL | 1 refills | Status: DC

## 2021-09-13 MED ORDER — QUETIAPINE FUMARATE 25 MG PO TABS: 25.0000 mg | ORAL_TABLET | Freq: Every day | ORAL | 1 refills | Status: DC

## 2021-09-13 MED ORDER — CLONAZEPAM 1 MG PO TABS: 1.0000 mg | ORAL_TABLET | Freq: Three times a day (TID) | ORAL | 0 refills | Status: DC

## 2021-09-13 MED ORDER — DULOXETINE HCL 60 MG PO CPEP: 60.0000 mg | ORAL_CAPSULE | Freq: Every day | ORAL | 1 refills | Status: DC

## 2021-09-13 MED ORDER — TRIAMTERENE-HCTZ 37.5-25 MG PO CAPS: 1.0000 | ORAL_CAPSULE | Freq: Every day | ORAL | 1 refills | Status: DC

## 2021-09-13 MED ORDER — TRELEGY ELLIPTA 100-62.5-25 MCG/ACT IN AEPB: 1.0000 | INHALATION_SPRAY | Freq: Every day | RESPIRATORY_TRACT | 1 refills | Status: DC

## 2021-09-13 NOTE — Progress Notes (Signed)
Subjective:  Patient ID: Jenna Stephens, female    DOB: Sep 06, 1975  Age: 46 y.o. MRN: [id number]  CC: Anemia   HPI Jenna Stephens presents for f/up -   She complains of worsening panic and anxiety.  She has been doubling up the dose of clonazepam.  The anxiety and panic have been so severe that her husband has had to stop driving the car so that she could get out and take a deep breath.  She complains of insomnia and weight loss.  She denies suicidality or homicidality.  She has apathy but does not feel worthless, hopeless, or helpless.  Outpatient Medications Prior to Visit  Medication Sig Dispense Refill   albuterol (VENTOLIN HFA) 108 (90 Base) MCG/ACT inhaler Inhale 2 puffs into the lungs every 6 (six) hours as needed for wheezing or shortness of breath. 8 g 0   albuterol (VENTOLIN HFA) 108 (90 Base) MCG/ACT inhaler Inhale 1-2 puffs into the lungs every 6 (six) hours as needed for wheezing or shortness of breath. 8 g 0   ipratropium-albuterol (DUONEB) 0.5-2.5 (3) MG/3ML SOLN Take 3 mLs by nebulization every 4 (four) hours as needed. 360 mL 1   iron polysaccharides (NIFEREX) 150 MG capsule Take 1 capsule (150 mg total) by mouth daily. 180 capsule 1   montelukast (SINGULAIR) 10 MG tablet Take 1 tablet (10 mg total) by mouth at bedtime. 30 tablet 5   Multiple Vitamin (MULTIVITAMIN) tablet Take by mouth daily.     omeprazole (PRILOSEC) 40 MG capsule Take 1 capsule (40 mg total) by mouth daily. 90 capsule 1   clonazePAM (KLONOPIN) 0.5 MG tablet Take 1 tablet (0.5 mg total) by mouth 3 (three) times daily as needed for anxiety. 90 tablet 1   DULoxetine (CYMBALTA) 30 MG capsule Take 1 capsule (30 mg total) by mouth daily. 30 capsule 0   Fluticasone-Umeclidin-Vilant (TRELEGY ELLIPTA) 100-62.5-25 MCG/ACT AEPB Inhale 1 puff into the lungs daily. 120 each 0   nebivolol (BYSTOLIC) 10 MG tablet Take 1 tablet (10 mg total) by mouth daily. 90 tablet 0   triamterene-hydrochlorothiazide (DYAZIDE) 37.5-25  MG capsule Take 1 each (1 capsule total) by mouth daily. 90 capsule 0   No facility-administered medications prior to visit.    ROS Review of Systems  Constitutional:  Positive for unexpected weight change. Negative for chills, diaphoresis and fatigue.  HENT: Negative.    Eyes: Negative.   Respiratory:  Negative for cough, chest tightness, shortness of breath and wheezing.   Cardiovascular:  Negative for chest pain, palpitations and leg swelling.  Gastrointestinal:  Negative for abdominal pain, constipation, diarrhea, nausea and vomiting.  Endocrine: Negative.   Genitourinary: Negative.  Negative for difficulty urinating.  Musculoskeletal: Negative.   Skin: Negative.   Neurological:  Negative for dizziness, weakness, light-headedness and headaches.  Hematological:  Negative for adenopathy. Does not bruise/bleed easily.  Psychiatric/Behavioral:  Positive for dysphoric mood and sleep disturbance. Negative for confusion, decreased concentration, self-injury and suicidal ideas. The patient is nervous/anxious. The patient is not hyperactive.    Objective:  BP 136/88 (BP Location: Right Arm, Patient Position: Sitting, Cuff Size: Large)   Pulse 85   Temp 97.9 F (36.6 C) (Oral)   Resp 16   Ht '5\' 7"'$  (1.702 m)   Wt 277 lb (125.6 kg)   LMP 09/02/2021 (Exact Date)   SpO2 95%   BMI 43.38 kg/m   BP Readings from Last 3 Encounters:  09/13/21 136/88  06/27/21 (!) 166/116  04/11/21 (!) 177/114  Wt Readings from Last 3 Encounters:  09/13/21 277 lb (125.6 kg)  06/27/21 286 lb (129.7 kg)  07/04/20 287 lb 12.8 oz (130.5 kg)    Physical Exam Vitals reviewed.  Constitutional:      Appearance: She is not ill-appearing.  HENT:     Mouth/Throat:     Mouth: Mucous membranes are moist.  Eyes:     General: No scleral icterus.    Conjunctiva/sclera: Conjunctivae normal.  Cardiovascular:     Rate and Rhythm: Normal rate and regular rhythm.     Heart sounds: No murmur heard. Pulmonary:      Effort: Pulmonary effort is normal.     Breath sounds: No stridor. No wheezing, rhonchi or rales.  Abdominal:     General: Abdomen is flat.     Palpations: There is no mass.     Tenderness: There is no abdominal tenderness. There is no guarding.     Hernia: No hernia is present.  Musculoskeletal:        General: Normal range of motion.     Cervical back: Neck supple.     Right lower leg: No edema.     Left lower leg: No edema.  Lymphadenopathy:     Cervical: No cervical adenopathy.  Skin:    General: Skin is warm and dry.  Neurological:     General: No focal deficit present.     Mental Status: She is alert. Mental status is at baseline.  Psychiatric:        Attention and Perception: Attention and perception normal. She is attentive.        Mood and Affect: Mood is anxious and depressed. Affect is not flat or tearful.        Speech: Speech normal. She is communicative. Speech is not delayed or tangential.        Behavior: Behavior normal. Behavior is not agitated, slowed, aggressive or hyperactive.        Thought Content: Thought content normal.    Lab Results  Component Value Date   WBC 11.1 (H) 09/13/2021   HGB 10.5 (L) 09/13/2021   HCT 34.7 (L) 09/13/2021   PLT 350.0 09/13/2021   GLUCOSE 123 (H) 06/27/2021   CHOL 227 (H) 06/27/2021   TRIG 171.0 (H) 06/27/2021   HDL 56.60 06/27/2021   LDLCALC 136 (H) 06/27/2021   ALT 11 06/27/2021   AST 11 06/27/2021   NA 137 06/27/2021   K 3.5 06/27/2021   CL 103 06/27/2021   CREATININE 0.72 06/27/2021   BUN 10 06/27/2021   CO2 25 06/27/2021   TSH 1.57 06/27/2021   INR 0.99 11/28/2017   HGBA1C 5.8 06/27/2021   MICROALBUR <0.7 06/27/2021    MM 3D SCREEN BREAST BILATERAL  Result Date: 08/24/2021 CLINICAL DATA:  Screening. EXAM: DIGITAL SCREENING BILATERAL MAMMOGRAM WITH TOMOSYNTHESIS AND CAD TECHNIQUE: Bilateral screening digital craniocaudal and mediolateral oblique mammograms were obtained. Bilateral screening digital  breast tomosynthesis was performed. The images were evaluated with computer-aided detection. COMPARISON:  Previous exam(s). ACR Breast Density Category b: There are scattered areas of fibroglandular density. FINDINGS: There are no findings suspicious for malignancy. IMPRESSION: No mammographic evidence of malignancy. A result letter of this screening mammogram will be mailed directly to the patient. RECOMMENDATION: Screening mammogram in one year. (Code:SM-B-01Y) BI-RADS CATEGORY  1: Negative. Electronically Signed   By: Dorise Bullion III M.D.   On: 08/24/2021 18:36    Assessment & Plan:   Jenna Stephens was seen today for anemia.  Diagnoses  and all orders for this visit:  Malignant hypertension- Her blood pressure is adequately well controlled. -     nebivolol (BYSTOLIC) 10 MG tablet; Take 1 tablet (10 mg total) by mouth daily. -     triamterene-hydrochlorothiazide (DYAZIDE) 37.5-25 MG capsule; Take 1 each (1 capsule total) by mouth daily.  Iron deficiency anemia due to chronic blood loss- She is not responding to oral supplementation so I recommended that she consider an iron infusion. -     IBC + Ferritin; Future -     CBC with Differential/Platelet; Future -     CBC with Differential/Platelet -     IBC + Ferritin -     Ambulatory referral to Hematology / Oncology  Severe persistent asthma without complication -     Fluticasone-Umeclidin-Vilant (TRELEGY ELLIPTA) 100-62.5-25 MCG/ACT AEPB; Inhale 1 puff into the lungs daily.  GAD (generalized anxiety disorder)- Will add quetiapine, increase the dose of duloxetine and clonazepam. -     DULoxetine (CYMBALTA) 60 MG capsule; Take 1 capsule (60 mg total) by mouth daily. -     QUEtiapine (SEROQUEL) 25 MG tablet; Take 1 tablet (25 mg total) by mouth at bedtime. -     clonazePAM (KLONOPIN) 1 MG tablet; Take 1 tablet (1 mg total) by mouth 3 (three) times daily.  Moderate episode of recurrent major depressive disorder ([facility]) -     DULoxetine (CYMBALTA)  60 MG capsule; Take 1 capsule (60 mg total) by mouth daily. -     QUEtiapine (SEROQUEL) 25 MG tablet; Take 1 tablet (25 mg total) by mouth at bedtime.   I have discontinued Macee S. Rome's clonazePAM and DULoxetine. I am also having her start on DULoxetine, QUEtiapine, and clonazePAM. Additionally, I am having her maintain her ipratropium-albuterol, albuterol, multivitamin, montelukast, albuterol, iron polysaccharides, omeprazole, Trelegy Ellipta, nebivolol, and triamterene-hydrochlorothiazide.  Meds ordered this encounter  Medications   Fluticasone-Umeclidin-Vilant (TRELEGY ELLIPTA) 100-62.5-25 MCG/ACT AEPB    Sig: Inhale 1 puff into the lungs daily.    Dispense:  120 each    Refill:  1   nebivolol (BYSTOLIC) 10 MG tablet    Sig: Take 1 tablet (10 mg total) by mouth daily.    Dispense:  90 tablet    Refill:  1   triamterene-hydrochlorothiazide (DYAZIDE) 37.5-25 MG capsule    Sig: Take 1 each (1 capsule total) by mouth daily.    Dispense:  90 capsule    Refill:  1   DULoxetine (CYMBALTA) 60 MG capsule    Sig: Take 1 capsule (60 mg total) by mouth daily.    Dispense:  90 capsule    Refill:  1   QUEtiapine (SEROQUEL) 25 MG tablet    Sig: Take 1 tablet (25 mg total) by mouth at bedtime.    Dispense:  90 tablet    Refill:  1   clonazePAM (KLONOPIN) 1 MG tablet    Sig: Take 1 tablet (1 mg total) by mouth 3 (three) times daily.    Dispense:  270 tablet    Refill:  0     Follow-up: Return in about 3 months (around 12/14/2021).  Scarlette Calico, MD

## 2021-09-13 NOTE — Patient Instructions (Signed)
Major Depressive Disorder, Adult Major depressive disorder (MDD) is a mental health condition. It may also be called clinical depression or unipolar depression. MDD causes symptoms of sadness, hopelessness, and loss of interest in things. These symptoms last most of the day, almost every day, for 2 weeks. MDD can also cause physical symptoms. It can interfere with relationships and with everyday activities, such as work, school, and activities that are usually pleasant. MDD may be mild, moderate, or severe. It may be single-episode MDD, which happens once, or recurrent MDD, which may occur multiple times. What are the causes? The exact cause of this condition is not known. MDD is most likely caused by a combination of things, which may include: Your personality traits. Learned or conditioned behaviors or thoughts or feelings that reinforce negativity. Any alcohol or substance misuse. Long-term (chronic) physical or mental health illness. Going through a traumatic experience or major life changes. What increases the risk? The following factors may make someone more likely to develop MDD: A family history of depression. Being a woman. Troubled family relationships. Abnormally low levels of certain brain chemicals. Traumatic or painful events in childhood, especially abuse or loss of a parent. A lot of stress from life experiences, such as poor living conditions or discrimination. Chronic physical illness or other mental health disorders. What are the signs or symptoms? The main symptoms of MDD usually include: Constant depressed or irritable mood. A loss of interest in things and activities. Other symptoms include: Sleeping or eating too much or too little. Unexplained weight gain or weight loss. Tiredness or low energy. Being agitated, restless, or weak. Feeling hopeless, worthless, or guilty. Trouble thinking clearly or making decisions. Thoughts of suicide or thoughts of harming  others. Isolating oneself or avoiding other people or activities. Trouble completing tasks, work, or any normal obligations. Severe symptoms of this condition may include: Psychotic depression.This may include false beliefs, or delusions. It may also include seeing, hearing, tasting, smelling, or feeling things that are not real (hallucinations). Chronic depression or persistent depressive disorder. This is low-level depression that lasts for at least 2 years. Melancholic depression, or feeling extremely sad and hopeless. Catatonic depression, which includes trouble speaking and trouble moving. How is this diagnosed? This condition may be diagnosed based on: Your symptoms. Your medical and mental health history. You may be asked questions about your lifestyle, including any drug and alcohol use. A physical exam. Blood tests to rule out other conditions. MDD is confirmed if you have the following symptoms most of the day, nearly every day, in a 2-week period: Either a depressed mood or loss of interest. At least four other MDD symptoms. How is this treated? This condition is usually treated by mental health professionals, such as psychologists, psychiatrists, and clinical social workers. You may need more than one type of treatment. Treatment may include: Psychotherapy, also called talk therapy or counseling. Types of psychotherapy include: Cognitive behavioral therapy (CBT). This teaches you to recognize unhealthy feelings, thoughts, and behaviors, and replace them with positive thoughts and actions. Interpersonal therapy (IPT). This helps you to improve the way you communicate with others or relate to them. Family therapy. This treatment includes members of your family. Medicines to treat anxiety and depression. These medicines help to balance the brain chemicals that affect your emotions. Lifestyle changes. You may be asked to: Limit alcohol use and avoid drug use. Get regular  exercise. Get plenty of sleep. Make healthy eating choices. Spend more time outdoors. Brain stimulation. This may   be done if symptoms are very severe and other treatments have not worked. Examples of this treatment are electroconvulsive therapy and transcranial magnetic stimulation. Follow these instructions at home: Activity Exercise regularly and spend time outdoors. Find activities that you enjoy doing, and make time to do them. Find healthy ways to manage stress, such as: Meditation or deep breathing. Spending time in nature. Journaling. Return to your normal activities as told by your health care provider. Ask your health care provider what activities are safe for you. Alcohol and drug use If you drink alcohol: Limit how much you use to: 0-1 drink a day for women who are not pregnant. 0-2 drinks a day for men. Be aware of how much alcohol is in your drink. In the [place] one drink equals one 12 oz bottle of beer (355 mL), one 5 oz glass of wine (148 mL), or one 1 oz glass of hard liquor (44 mL). Discuss your alcohol use with your health care provider. Alcohol can affect any antidepressant medicines you are taking. Discuss any drug use with your health care provider. General instructions  Take over-the-counter and prescription medicines only as told by your health care provider. Eat a healthy diet and get plenty of sleep. Consider joining a support group. Your health care provider may be able to recommend one. Keep all follow-up visits as told by your health care provider. This is important. Where to find more information National Alliance on Mental Illness: www.nami.org [place] National Institute of Mental Health: www.nimh.nih.gov Contact a health care provider if: Your symptoms get worse. You develop new symptoms. Get help right away if: You self-harm. You have serious thoughts about hurting yourself or others. You hallucinate. If you ever feel like you may hurt yourself or  others, or have thoughts about taking your own life, get help right away. Go to your nearest emergency department or: Call your local emergency services (911 in the [place] Call a suicide crisis helpline, such as the National Suicide Prevention Lifeline at 1-800-273-8255 or 988 in the [place] This is open 24 hours a day in the [place] Text the Crisis Text Line at 741741 (in the [place] Summary Major depressive disorder (MDD) is a mental health condition. MDD causes symptoms of sadness, hopelessness, and loss of interest in things. These symptoms last most of the day, almost every day, for 2 weeks. The symptoms of MDD can interfere with relationships and with everyday activities. Treatments and support are available for people who develop MDD. You may need more than one type of treatment. Get help right away if you have serious thoughts about hurting yourself or others. This information is not intended to replace advice given to you by your health care provider. Make sure you discuss any questions you have with your health care provider. Document Revised: 11/02/2020 Document Reviewed: 03/21/2019 Elsevier Patient Education  2023 Elsevier Inc.  

## 2021-09-14 ENCOUNTER — Telehealth: Payer: Self-pay | Admitting: Hematology and Oncology

## 2021-09-14 NOTE — Telephone Encounter (Signed)
Scheduled appt per 5/24 referral. Pt is aware of appt date and time. Pt is aware to arrive 15 mins prior to appt time and to bring and updated insurance card. Pt is aware of appt location.   

## 2021-09-18 ENCOUNTER — Encounter: Payer: Self-pay | Admitting: Internal Medicine

## 2021-10-02 ENCOUNTER — Inpatient Hospital Stay: Payer: BC Managed Care – PPO

## 2021-10-02 ENCOUNTER — Inpatient Hospital Stay: Payer: BC Managed Care – PPO | Attending: Hematology and Oncology | Admitting: Hematology and Oncology

## 2021-10-02 ENCOUNTER — Other Ambulatory Visit: Payer: Self-pay

## 2021-10-02 ENCOUNTER — Telehealth: Payer: Self-pay | Admitting: Hematology and Oncology

## 2021-10-02 ENCOUNTER — Telehealth: Payer: Self-pay | Admitting: Pharmacy Technician

## 2021-10-02 VITALS — BP 167/102 | HR 89 | Temp 97.9°F | Resp 16 | Wt 276.7 lb

## 2021-10-02 DIAGNOSIS — Z833 Family history of diabetes mellitus: Secondary | ICD-10-CM | POA: Diagnosis not present

## 2021-10-02 DIAGNOSIS — N920 Excessive and frequent menstruation with regular cycle: Secondary | ICD-10-CM | POA: Diagnosis not present

## 2021-10-02 DIAGNOSIS — E669 Obesity, unspecified: Secondary | ICD-10-CM | POA: Diagnosis not present

## 2021-10-02 DIAGNOSIS — F419 Anxiety disorder, unspecified: Secondary | ICD-10-CM | POA: Diagnosis not present

## 2021-10-02 DIAGNOSIS — Z79899 Other long term (current) drug therapy: Secondary | ICD-10-CM | POA: Insufficient documentation

## 2021-10-02 DIAGNOSIS — D72829 Elevated white blood cell count, unspecified: Secondary | ICD-10-CM | POA: Insufficient documentation

## 2021-10-02 DIAGNOSIS — Z836 Family history of other diseases of the respiratory system: Secondary | ICD-10-CM | POA: Diagnosis not present

## 2021-10-02 DIAGNOSIS — D500 Iron deficiency anemia secondary to blood loss (chronic): Secondary | ICD-10-CM | POA: Insufficient documentation

## 2021-10-02 DIAGNOSIS — I10 Essential (primary) hypertension: Secondary | ICD-10-CM | POA: Diagnosis not present

## 2021-10-02 DIAGNOSIS — Z803 Family history of malignant neoplasm of breast: Secondary | ICD-10-CM | POA: Diagnosis not present

## 2021-10-02 DIAGNOSIS — Z8249 Family history of ischemic heart disease and other diseases of the circulatory system: Secondary | ICD-10-CM | POA: Diagnosis not present

## 2021-10-02 DIAGNOSIS — Z87891 Personal history of nicotine dependence: Secondary | ICD-10-CM | POA: Diagnosis not present

## 2021-10-02 DIAGNOSIS — J45909 Unspecified asthma, uncomplicated: Secondary | ICD-10-CM | POA: Insufficient documentation

## 2021-10-02 NOTE — Telephone Encounter (Signed)
Per 6/12 los called and spoke to pt about appointment.  Pt confirmed appointment  

## 2021-10-02 NOTE — Progress Notes (Signed)
[facility] [phone]   Fax:([phone]) [place] NOTE  Patient Care Team: Janith Lima, MD as PCP - General (Internal Medicine) Wallene Huh, MD  Hematological/Oncological History # Iron Deficiency Anemia 2/2 to GYN Bleeding 09/13/2021: WBC 11.1, Hgb 10.5, Plt 350, MCV 68.6, TIBC 543, Ferritin 2.9, Sat ratio 3.9%.   10/02/2021: establish care with Dr. Lorenso Courier   CHIEF COMPLAINTS/PURPOSE OF CONSULTATION:  "Iron Deficiency Anemia  "  HISTORY OF PRESENTING ILLNESS:  Jenna Stephens 46 y.o. female with medical history significant for asthma, anxiety, hypertension, and obesity who presents for evaluation of iron deficiency anemia.  On review of the previous records Jenna Stephens has a longstanding history of leukocytosis and microcytic anemia dating back to at least 05/22/2015.  At that time she was noted to have white blood cell count 13.9, hemoglobin 11.9, MCV 76.4, and platelets of 363.  Her white blood cells appear to be of neutrophilic predominance.  On exam today Jenna Stephens reports she has had iron deficiency for "a long time".  She notes that dates back to the time when she was a teenager.  She notes that she does take iron pills and that do not upset her stomach.  She does not have any issues with constipation or diarrhea.  She reports that she has been on her current round of iron therapy for about 2 months with no improvement in her blood counts.  She has never received IV iron therapy or transfusions before.  On further discussion she notes that she does have heavy menstrual cycles and that they occur every 28 days and last for approximately 6 to 7 days.  She reports that they are heavy for about 2 to 3 days and she goes to about 1 pad per hour on her heaviest days.  She uses the super pads.  They are soaked when she changes them.  She notes that she eats a regular diet and does enjoy eating red meat approximate twice per week.  She normally  prefers chicken but does eat spinach, cabbage, and broccoli.  She does not have any bleeding from any other sources.  She denies any dark stools.  In regards to her family history she has breast cancer in her maternal grandmother and paternal grandmother.  She underwent her last mammogram a couple weeks ago.  Her mother is healthy though she has asthma.  Her father passed away of COPD.  She has 2 children a son and a daughter.  She reports that she smokes about 2 cigarettes/day and has been doing this for many years.  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  She drinks alcohol socially.  She does endorse feeling tired, having shortness of breath, and craving ice.  She also craves starch.  She otherwise denies any fevers, chills, sweats, nausea, vomiting or diarrhea.  A full 10 point ROS is listed below.  MEDICAL HISTORY:  Past Medical History:  Diagnosis Date   Acute respiratory failure ([place]) 12/29/2017   Anxiety    Asthma    Gestational diabetes    Hypertension    Iron deficiency anemia    Obesity     SURGICAL HISTORY: Past Surgical History:  Procedure Laterality Date   TONSILLECTOMY     TUBAL LIGATION N/A 10/07/2018   Procedure: POST PARTUM TUBAL LIGATION;  Surgeon: Truett Mainland, DO;  Location: [facility] LD ORS;  Service: Gynecology;  Laterality: N/A;    SOCIAL HISTORY: Social History  Socioeconomic History   Marital status: Married    Spouse name: Not on file   Number of children: Not on file   Years of education: Not on file   Highest education level: Not on file  Occupational History   Not on file  Tobacco Use   Smoking status: Former    Packs/day: 0.25    Types: Cigarettes    Quit date: 01/06/2018    Years since quitting: 3.7    Passive exposure: Past   Smokeless tobacco: Never   Tobacco comments:    stopped smoking after this hospital admission  Vaping Use   Vaping Use: Never used  Substance and Sexual Activity   Alcohol use: Not Currently    Drug use: Not Currently    Types: Marijuana    Comment: last smoked 09/26/2018   Sexual activity: Yes    Partners: Male    Birth control/protection: Surgical  Other Topics Concern   Not on file  Social History Narrative   ** Merged History Encounter **       Social Determinants of Health   Financial Resource Strain: Not on file  Food Insecurity: Not on file  Transportation Needs: Not on file  Physical Activity: Not on file  Stress: Not on file  Social Connections: Not on file  Intimate Partner Violence: Unknown (10/03/2018)   Humiliation, Afraid, Rape, and Kick questionnaire    Fear of Current or Ex-Partner: Patient refused    Emotionally Abused: Patient refused    Physically Abused: Patient refused    Sexually Abused: Patient refused    FAMILY HISTORY: Family History  Problem Relation Age of Onset   Diabetes Mother    Hypertension Mother    Hypertension Father    COPD Father     ALLERGIES:  has No Known Allergies.  MEDICATIONS:  Current Outpatient Medications  Medication Sig Dispense Refill   albuterol (VENTOLIN HFA) 108 (90 Base) MCG/ACT inhaler Inhale 1-2 puffs into the lungs every 6 (six) hours as needed for wheezing or shortness of breath. 8 g 0   clonazePAM (KLONOPIN) 1 MG tablet Take 1 tablet (1 mg total) by mouth 3 (three) times daily. 270 tablet 0   DULoxetine (CYMBALTA) 60 MG capsule Take 1 capsule (60 mg total) by mouth daily. 90 capsule 1   Fluticasone-Umeclidin-Vilant (TRELEGY ELLIPTA) 100-62.5-25 MCG/ACT AEPB Inhale 1 puff into the lungs daily. 120 each 1   ipratropium-albuterol (DUONEB) 0.5-2.5 (3) MG/3ML SOLN Take 3 mLs by nebulization every 4 (four) hours as needed. 360 mL 1   iron polysaccharides (NIFEREX) 150 MG capsule Take 1 capsule (150 mg total) by mouth daily. 180 capsule 1   Multiple Vitamin (MULTIVITAMIN) tablet Take by mouth daily.     nebivolol (BYSTOLIC) 10 MG tablet Take 1 tablet (10 mg total) by mouth daily. 90 tablet 1   omeprazole  (PRILOSEC) 40 MG capsule Take 1 capsule (40 mg total) by mouth daily. 90 capsule 1   QUEtiapine (SEROQUEL) 25 MG tablet Take 1 tablet (25 mg total) by mouth at bedtime. 90 tablet 1   triamterene-hydrochlorothiazide (DYAZIDE) 37.5-25 MG capsule Take 1 each (1 capsule total) by mouth daily. 90 capsule 1   No current facility-administered medications for this visit.    REVIEW OF SYSTEMS:   Constitutional: ( - ) fevers, ( - )  chills , ( - ) night sweats Eyes: ( - ) blurriness of vision, ( - ) double vision, ( - ) watery eyes Ears, nose, mouth, throat, and face: ( - )  mucositis, ( - ) sore throat Respiratory: ( - ) cough, ( - ) dyspnea, ( - ) wheezes Cardiovascular: ( - ) palpitation, ( - ) chest discomfort, ( - ) lower extremity swelling Gastrointestinal:  ( - ) nausea, ( - ) heartburn, ( - ) change in bowel habits Skin: ( - ) abnormal skin rashes Lymphatics: ( - ) new lymphadenopathy, ( - ) easy bruising Neurological: ( - ) numbness, ( - ) tingling, ( - ) new weaknesses Behavioral/Psych: ( - ) mood change, ( - ) new changes  All other systems were reviewed with the patient and are negative.  PHYSICAL EXAMINATION:  Vitals:   10/02/21 0901  BP: (!) 167/102  Pulse: 89  Resp: 16  Temp: 97.9 F (36.6 C)  SpO2: 98%   Filed Weights   10/02/21 0901  Weight: 276 lb 11.2 oz (125.5 kg)    GENERAL: well appearing middle-aged African-American female in NAD  SKIN: skin color, texture, turgor are normal, no rashes or significant lesions EYES: conjunctiva are pink and non-injected, sclera clear LUNGS: clear to auscultation and percussion with normal breathing effort HEART: regular rate & rhythm and no murmurs and no lower extremity edema Musculoskeletal: no cyanosis of digits and no clubbing  PSYCH: alert & oriented x 3, fluent speech NEURO: no focal motor/sensory deficits  LABORATORY DATA:  I have reviewed the data as listed    Latest Ref Rng & Units 09/13/2021    9:18 AM 06/27/2021     9:59 AM 10/17/2019    5:08 AM  CBC  WBC 4.0 - 10.5 K/uL 11.1  11.3  23.4   Hemoglobin 12.0 - 15.0 g/dL 10.5  10.6  11.1   Hematocrit 36.0 - 46.0 % 34.7  34.9  37.4   Platelets 150.0 - 400.0 K/uL 350.0  275.0  399        Latest Ref Rng & Units 06/27/2021    9:59 AM 10/17/2019    5:08 AM 10/16/2019    2:13 AM  CMP  Glucose 70 - 99 mg/dL 123  134  143   BUN 6 - 23 mg/dL '10  13  11   '$ Creatinine 0.40 - 1.20 mg/dL 0.72  0.72  0.67   Sodium 135 - 145 mEq/L 137  132  133   Potassium 3.5 - 5.1 mEq/L 3.5  3.8  3.8   Chloride 96 - 112 mEq/L 103  101  105   CO2 19 - 32 mEq/L '25  22  19   '$ Calcium 8.4 - 10.5 mg/dL 9.2  8.3  8.7   Total Protein 6.0 - 8.3 g/dL 7.3     Total Bilirubin 0.2 - 1.2 mg/dL 0.4     Alkaline Phos 39 - 117 U/L 64     AST 0 - 37 U/L 11     ALT 0 - 35 U/L 11        ASSESSMENT & PLAN Jenna Stephens 46 y.o. female with medical history significant for asthma, anxiety, hypertension, and obesity who presents for evaluation of iron deficiency anemia.  After review of the labs, review of the records, and discussion with the patient the patients findings are most consistent with iron deficiency anemia secondary to heavy menstrual bleeding.  The patient has establish care with OB/GYN and had a recent appointment that she had to reschedule due to a family emergency.  She notes that she will be rescheduling with OB/GYN.  At this time we will plan to bolster her  iron levels with IV iron therapy given her failure of p.o. therapy to improve her counts.  The patient voiced understanding of the plan moving forward.  # Iron Deficiency Anemia 2/2 to GYN Bleeding -- Findings are consistent with iron deficiency anemia secondary to patient's menorrhagia --Encouraged her to follow-up with OB/GYN for better control of her menstrual cycles -- Labs from 09/13/2021 confirm diagnosis of iron deficiency.  At next visit we will repeat iron panel and ferritin as well as reticulocytes, CBC, and  CMP --Continue iron polysaccharide mg daily with a source of vitamin C --We will plan to proceed with IV iron therapy in order to help bolster the patient's blood counts --Plan for return to clinic in 4 to 6 weeks time after last dose of IV iron  #Leukocytosis --Etiology is unclear, though the patient is an active smoker --We will review inflammatory labs with next clinic visit --Stably present for at least 5 years.   No orders of the defined types were placed in this encounter.   All questions were answered. The patient knows to call the clinic with any problems, questions or concerns.  A total of more than 60 minutes were spent on this encounter with face-to-face time and non-face-to-face time, including preparing to see the patient, ordering tests and/or medications, counseling the patient and coordination of care as outlined above.   Ledell Peoples, MD Department of Hematology/Oncology [place] [facility] Phone: [phone] Pager: [phone] Email: [email or web address]  10/02/2021 9:46 AM

## 2021-10-02 NOTE — Telephone Encounter (Signed)
Dr. Lorenso Courier, Juluis Rainier note:  Auth Submission: no auth needed Payer: [employer] Medication & CPT/J Code(s) submitted: MONOFERRIC L5726 Route of submission (phone, fax, portal): phone: [phone] Auth type: Buy/Bill Units/visits requested:  Reference number: [id number] - Aunyae-O 10/02/21 3:12.  & Chynna-W ref# [id number] Approval from:  to    Patient will be scheduled as soon as possible.

## 2021-10-26 ENCOUNTER — Ambulatory Visit: Payer: BC Managed Care – PPO

## 2021-10-30 ENCOUNTER — Ambulatory Visit (INDEPENDENT_AMBULATORY_CARE_PROVIDER_SITE_OTHER): Payer: BC Managed Care – PPO

## 2021-10-30 VITALS — BP 149/102 | HR 80 | Temp 98.2°F | Resp 16 | Ht 67.0 in | Wt 281.6 lb

## 2021-10-30 DIAGNOSIS — D500 Iron deficiency anemia secondary to blood loss (chronic): Secondary | ICD-10-CM | POA: Diagnosis not present

## 2021-10-30 MED ORDER — SODIUM CHLORIDE 0.9 % IV SOLN
1000.0000 mg | Freq: Once | INTRAVENOUS | Status: AC
  Administered 2021-10-30: 1000 mg via INTRAVENOUS
  Filled 2021-10-30: qty 10

## 2021-10-30 NOTE — Progress Notes (Signed)
Diagnosis: Iron Deficiency Anemia  Provider:  Marshell Garfinkel, MD  Procedure: Infusion  IV Type: Peripheral, IV Location: L Antecubital  Monoferric (Ferric Derisomaltose), Dose: 1000 mg  Infusion Start Time: 2508  Infusion Stop Time: 7199  Post Infusion IV Care: Observation period completed and Peripheral IV Discontinued  Discharge: Condition: Good, Destination: Home . AVS provided to patient.   Performed by:  Koren Shiver, RN

## 2021-11-26 ENCOUNTER — Other Ambulatory Visit: Payer: Self-pay | Admitting: Hematology and Oncology

## 2021-11-26 DIAGNOSIS — D500 Iron deficiency anemia secondary to blood loss (chronic): Secondary | ICD-10-CM

## 2021-11-27 ENCOUNTER — Telehealth: Payer: Self-pay | Admitting: Hematology and Oncology

## 2021-11-27 ENCOUNTER — Inpatient Hospital Stay (HOSPITAL_BASED_OUTPATIENT_CLINIC_OR_DEPARTMENT_OTHER): Payer: BC Managed Care – PPO | Admitting: Hematology and Oncology

## 2021-11-27 ENCOUNTER — Other Ambulatory Visit: Payer: Self-pay

## 2021-11-27 ENCOUNTER — Inpatient Hospital Stay: Payer: BC Managed Care – PPO | Attending: Hematology and Oncology

## 2021-11-27 VITALS — BP 180/113 | HR 72 | Temp 98.2°F | Resp 16 | Wt 282.7 lb

## 2021-11-27 DIAGNOSIS — I10 Essential (primary) hypertension: Secondary | ICD-10-CM | POA: Insufficient documentation

## 2021-11-27 DIAGNOSIS — Z836 Family history of other diseases of the respiratory system: Secondary | ICD-10-CM | POA: Diagnosis not present

## 2021-11-27 DIAGNOSIS — Z833 Family history of diabetes mellitus: Secondary | ICD-10-CM | POA: Diagnosis not present

## 2021-11-27 DIAGNOSIS — J45909 Unspecified asthma, uncomplicated: Secondary | ICD-10-CM | POA: Diagnosis not present

## 2021-11-27 DIAGNOSIS — D72829 Elevated white blood cell count, unspecified: Secondary | ICD-10-CM | POA: Insufficient documentation

## 2021-11-27 DIAGNOSIS — N920 Excessive and frequent menstruation with regular cycle: Secondary | ICD-10-CM | POA: Diagnosis not present

## 2021-11-27 DIAGNOSIS — Z8249 Family history of ischemic heart disease and other diseases of the circulatory system: Secondary | ICD-10-CM | POA: Insufficient documentation

## 2021-11-27 DIAGNOSIS — D500 Iron deficiency anemia secondary to blood loss (chronic): Secondary | ICD-10-CM | POA: Insufficient documentation

## 2021-11-27 DIAGNOSIS — Z79899 Other long term (current) drug therapy: Secondary | ICD-10-CM | POA: Insufficient documentation

## 2021-11-27 DIAGNOSIS — Z87891 Personal history of nicotine dependence: Secondary | ICD-10-CM | POA: Diagnosis not present

## 2021-11-27 LAB — RETIC PANEL
Immature Retic Fract: 24.4 % — ABNORMAL HIGH (ref 2.3–15.9)
RBC.: 4.96 MIL/uL (ref 3.87–5.11)
Retic Count, Absolute: 110.6 10*3/uL (ref 19.0–186.0)
Retic Ct Pct: 2.2 % (ref 0.4–3.1)
Reticulocyte Hemoglobin: 25.4 pg — ABNORMAL LOW (ref >27.9)

## 2021-11-27 LAB — CBC WITH DIFFERENTIAL (CANCER CENTER ONLY)
Abs Immature Granulocytes: 0.04 10*3/uL (ref 0.00–0.07)
Basophils Absolute: 0 10*3/uL (ref 0.0–0.1)
Basophils Relative: 1 %
Eosinophils Absolute: 0 10*3/uL (ref 0.0–0.5)
Eosinophils Relative: 0 %
HCT: 35.6 % — ABNORMAL LOW (ref 36.0–46.0)
Hemoglobin: 11.1 g/dL — ABNORMAL LOW (ref 12.0–15.0)
Immature Granulocytes: 1 %
Lymphocytes Relative: 24 %
Lymphs Abs: 2.1 10*3/uL (ref 0.7–4.0)
MCH: 22.5 pg — ABNORMAL LOW (ref 26.0–34.0)
MCHC: 31.2 g/dL (ref 30.0–36.0)
MCV: 72.2 fL — ABNORMAL LOW (ref 80.0–100.0)
Monocytes Absolute: 0.6 10*3/uL (ref 0.1–1.0)
Monocytes Relative: 7 %
Neutro Abs: 6 10*3/uL (ref 1.7–7.7)
Neutrophils Relative %: 67 %
Platelet Count: 312 10*3/uL (ref 150–400)
RBC: 4.93 MIL/uL (ref 3.87–5.11)
RDW: 21.8 % — ABNORMAL HIGH (ref 11.5–15.5)
WBC Count: 8.9 10*3/uL (ref 4.0–10.5)
nRBC: 0 % (ref 0.0–0.2)

## 2021-11-27 LAB — IRON AND IRON BINDING CAPACITY (CC-WL,HP ONLY)
Iron: 98 ug/dL (ref 28–170)
Saturation Ratios: 27 % (ref 10.4–31.8)
TIBC: 358 ug/dL (ref 250–450)
UIBC: 260 ug/dL (ref 148–442)

## 2021-11-27 LAB — CMP (CANCER CENTER ONLY)
ALT: 12 U/L (ref 0–44)
AST: 14 U/L — ABNORMAL LOW (ref 15–41)
Albumin: 4 g/dL (ref 3.5–5.0)
Alkaline Phosphatase: 51 U/L (ref 38–126)
Anion gap: 5 (ref 5–15)
BUN: 11 mg/dL (ref 6–20)
CO2: 26 mmol/L (ref 22–32)
Calcium: 8.7 mg/dL — ABNORMAL LOW (ref 8.9–10.3)
Chloride: 107 mmol/L (ref 98–111)
Creatinine: 0.73 mg/dL (ref 0.44–1.00)
GFR, Estimated: >60 mL/min (ref >60)
Glucose, Bld: 149 mg/dL — ABNORMAL HIGH (ref 70–99)
Potassium: 3.6 mmol/L (ref 3.5–5.1)
Sodium: 138 mmol/L (ref 135–145)
Total Bilirubin: 0.4 mg/dL (ref 0.3–1.2)
Total Protein: 7 g/dL (ref 6.5–8.1)

## 2021-11-27 LAB — FERRITIN: Ferritin: 106 ng/mL (ref 11–307)

## 2021-11-27 NOTE — Progress Notes (Addendum)
[place] [phone]   [phone]  PROGRESS NOTE  Patient Care Team: Janith Lima, MD as PCP - General (Internal Medicine) Wallene Huh, MD  Hematological/Oncological History # Iron Deficiency Anemia 2/2 to GYN Bleeding 09/13/2021: WBC 11.1, Hgb 10.5, Plt 350, MCV 68.6, TIBC 543, Ferritin 2.9, Sat ratio 3.9%.   10/02/2021: establish care with Dr. Lorenso Courier  10/30/2021: IV monoferric 1000 mg x 1 dose  11/27/2021: WBC 8.9, Hgb 11.1, MCV 72.2, and Plt 312  Interval History:  Jenna Stephens 46 y.o. female with medical history significant for iron deficiency anemia secondary to GYN bleeding who presents for a follow up visit. The patient's last visit was on 10/02/2021 at which time she established care. In the interim since the last visit she received 1 dose of IV Monoferric 1000 mg x 1 dose.  On exam today Jenna Stephens reports that she tolerated her 1 big dose of Monoferric quite well.  She did not have any infusion reactions.  She denies any headaches or muscle aches.  She reports unfortunately she did not notice any difference in her energy levels.  She endorses that her energy is currently a 3 or 4 out of 10.  She reports that her blood pressure is elevated today though she did take her blood pressure medications.  Is currently being managed by her primary doctor Dr. Ronnald Ramp.  She reports that her menstrual cycles remain regular "like clockwork".  They are still quite heavy.  On her heaviest day she goes through pads every 45 minutes and they are completely soaked.  She notes that she continues to take p.o. iron therapy 1 pill/day.  Is not causing any stomach upset or constipation.  She has not noticed any dark stools.  She reports that she has been trying to eat more red meat but it does not "agree with her".  She otherwise denies any fevers, chills, sweats, nausea, vomiting or diarrhea.  Full 10 point ROS is listed below.  MEDICAL HISTORY:  Past Medical  History:  Diagnosis Date   Acute respiratory failure ([place]) 12/29/2017   Anxiety    Asthma    Gestational diabetes    Hypertension    Iron deficiency anemia    Obesity     SURGICAL HISTORY: Past Surgical History:  Procedure Laterality Date   TONSILLECTOMY     TUBAL LIGATION N/A 10/07/2018   Procedure: POST PARTUM TUBAL LIGATION;  Surgeon: Truett Mainland, DO;  Location: [facility] LD ORS;  Service: Gynecology;  Laterality: N/A;    SOCIAL HISTORY: Social History   Socioeconomic History   Marital status: Married    Spouse name: Not on file   Number of children: Not on file   Years of education: Not on file   Highest education level: Not on file  Occupational History   Not on file  Tobacco Use   Smoking status: Former    Packs/day: 0.25    Types: Cigarettes    Quit date: 01/06/2018    Years since quitting: 3.9    Passive exposure: Past   Smokeless tobacco: Never   Tobacco comments:    stopped smoking after this hospital admission  Vaping Use   Vaping Use: Never used  Substance and Sexual Activity   Alcohol use: Not Currently   Drug use: Not Currently    Types: Marijuana    Comment: last smoked 09/26/2018   Sexual activity: Yes    Partners: Male    Birth control/protection: Surgical  Other Topics Concern   Not on file  Social History Narrative   ** Merged History Encounter **       Social Determinants of Health   Financial Resource Strain: Not on file  Food Insecurity: Not on file  Transportation Needs: Not on file  Physical Activity: Not on file  Stress: Not on file  Social Connections: Not on file  Intimate Partner Violence: Unknown (10/03/2018)   Humiliation, Afraid, Rape, and Kick questionnaire    Fear of Current or Ex-Partner: Patient refused    Emotionally Abused: Patient refused    Physically Abused: Patient refused    Sexually Abused: Patient refused    FAMILY HISTORY: Family History  Problem Relation Age of Onset   Diabetes Mother    Hypertension  Mother    Hypertension Father    COPD Father     ALLERGIES:  has No Known Allergies.  MEDICATIONS:  Current Outpatient Medications  Medication Sig Dispense Refill   albuterol (VENTOLIN HFA) 108 (90 Base) MCG/ACT inhaler Inhale 1-2 puffs into the lungs every 6 (six) hours as needed for wheezing or shortness of breath. 8 g 0   clonazePAM (KLONOPIN) 1 MG tablet Take 1 tablet (1 mg total) by mouth 3 (three) times daily. 270 tablet 0   DULoxetine (CYMBALTA) 60 MG capsule Take 1 capsule (60 mg total) by mouth daily. 90 capsule 1   Fluticasone-Umeclidin-Vilant (TRELEGY ELLIPTA) 100-62.5-25 MCG/ACT AEPB Inhale 1 puff into the lungs daily. 120 each 1   ipratropium-albuterol (DUONEB) 0.5-2.5 (3) MG/3ML SOLN Take 3 mLs by nebulization every 4 (four) hours as needed. 360 mL 1   iron polysaccharides (NIFEREX) 150 MG capsule Take 1 capsule (150 mg total) by mouth daily. 180 capsule 1   Multiple Vitamin (MULTIVITAMIN) tablet Take by mouth daily.     nebivolol (BYSTOLIC) 10 MG tablet Take 1 tablet (10 mg total) by mouth daily. 90 tablet 1   omeprazole (PRILOSEC) 40 MG capsule Take 1 capsule (40 mg total) by mouth daily. 90 capsule 1   QUEtiapine (SEROQUEL) 25 MG tablet Take 1 tablet (25 mg total) by mouth at bedtime. 90 tablet 1   triamterene-hydrochlorothiazide (DYAZIDE) 37.5-25 MG capsule Take 1 each (1 capsule total) by mouth daily. 90 capsule 1   No current facility-administered medications for this visit.    REVIEW OF SYSTEMS:   Constitutional: ( - ) fevers, ( - )  chills , ( - ) night sweats Eyes: ( - ) blurriness of vision, ( - ) double vision, ( - ) watery eyes Ears, nose, mouth, throat, and face: ( - ) mucositis, ( - ) sore throat Respiratory: ( - ) cough, ( - ) dyspnea, ( - ) wheezes Cardiovascular: ( - ) palpitation, ( - ) chest discomfort, ( - ) lower extremity swelling Gastrointestinal:  ( - ) nausea, ( - ) heartburn, ( - ) change in bowel habits Skin: ( - ) abnormal skin  rashes Lymphatics: ( - ) new lymphadenopathy, ( - ) easy bruising Neurological: ( - ) numbness, ( - ) tingling, ( - ) new weaknesses Behavioral/Psych: ( - ) mood change, ( - ) new changes  All other systems were reviewed with the patient and are negative.  PHYSICAL EXAMINATION:  Vitals:   11/27/21 1046  BP: (!) 180/113  Pulse: 72  Resp: 16  Temp: 98.2 F (36.8 C)   Filed Weights   11/27/21 1046  Weight: 282 lb 11.2 oz (128.2 kg)    GENERAL: Well-appearing middle-aged  African-American female, alert, no distress and comfortable SKIN: skin color, texture, turgor are normal, no rashes or significant lesions EYES: conjunctiva are pink and non-injected, sclera clear OROPHARYNX: no exudate, no erythema; lips, buccal mucosa, and tongue normal  NECK: supple, non-tender LYMPH:  no palpable lymphadenopathy in the cervical, axillary or inguinal LUNGS: clear to auscultation and percussion with normal breathing effort HEART: regular rate & rhythm and no murmurs and no lower extremity edema ABDOMEN: soft, non-tender, non-distended, normal bowel sounds Musculoskeletal: no cyanosis of digits and no clubbing  PSYCH: alert & oriented x 3, fluent speech NEURO: no focal motor/sensory deficits  LABORATORY DATA:  I have reviewed the data as listed    Latest Ref Rng & Units 11/27/2021   10:25 AM 09/13/2021    9:18 AM 06/27/2021    9:59 AM  CBC  WBC 4.0 - 10.5 K/uL 8.9  11.1  11.3   Hemoglobin 12.0 - 15.0 g/dL 11.1  10.5  10.6   Hematocrit 36.0 - 46.0 % 35.6  34.7  34.9   Platelets 150 - 400 K/uL 312  350.0  275.0        Latest Ref Rng & Units 11/27/2021   10:25 AM 06/27/2021    9:59 AM 10/17/2019    5:08 AM  CMP  Glucose 70 - 99 mg/dL 149  123  134   BUN 6 - 20 mg/dL '11  10  13   '$ Creatinine 0.44 - 1.00 mg/dL 0.73  0.72  0.72   Sodium 135 - 145 mmol/L 138  137  132   Potassium 3.5 - 5.1 mmol/L 3.6  3.5  3.8   Chloride 98 - 111 mmol/L 107  103  101   CO2 22 - 32 mmol/L '26  25  22   '$ Calcium  8.9 - 10.3 mg/dL 8.7  9.2  8.3   Total Protein 6.5 - 8.1 g/dL 7.0  7.3    Total Bilirubin 0.3 - 1.2 mg/dL 0.4  0.4    Alkaline Phos 38 - 126 U/L 51  64    AST 15 - 41 U/L 14  11    ALT 0 - 44 U/L 12  11      RADIOGRAPHIC STUDIES: No results found.  ASSESSMENT & PLAN Jenna Stephens 46 y.o. female with medical history significant for iron deficiency anemia secondary to GYN bleeding who presents for a follow up visit.   After review of the labs, review of the records, and discussion with the patient the patients findings are most consistent with iron deficiency anemia secondary to heavy menstrual bleeding.  The patient has establish care with OB/GYN and had a recent appointment that she had to reschedule due to a family emergency.  She notes that she will be rescheduling with OB/GYN.  At this time we will plan to bolster her iron levels with IV iron therapy given her failure of p.o. therapy to improve her counts.  The patient voiced understanding of the plan moving forward.  # Iron Deficiency Anemia 2/2 to GYN Bleeding -- Findings are consistent with iron deficiency anemia secondary to patient's menorrhagia --Encouraged her to follow-up with OB/GYN for better control of her menstrual cycles -- Labs from 09/13/2021 confirm diagnosis of iron deficiency.  At next visit we will repeat iron panel and ferritin as well as reticulocytes, CBC, and CMP --patient received IV monoferric on 10/30/2021 Plan:  --Continue iron polysaccharide mg daily with a source of vitamin C --Labs today show white blood cell count  8.9, hemoglobin 11.1, MCV 72.2, and platelets of 312 --Pending results of serum iron studies will determine if an additional dose of IV Monoferric is required. --Return to clinic pending results of above studies.   #Leukocytosis --Not present on labs today.  White blood cell count 8.9 --Etiology is unclear, though the patient is an active smoker.  It is of neutrophilic predominance. --Stably  present for at least 5 years.  No orders of the defined types were placed in this encounter.   All questions were answered. The patient knows to call the clinic with any problems, questions or concerns.  A total of more than 30 minutes were spent on this encounter with face-to-face time and non-face-to-face time, including preparing to see the patient, ordering tests and/or medications, counseling the patient and coordination of care as outlined above.   Ledell Peoples, MD Department of Hematology/Oncology [facility] Phone: [phone] Pager: [phone] Email: [email or web address]  12/01/2021 3:51 PM

## 2021-11-27 NOTE — Telephone Encounter (Signed)
Per 8/7 los called and left message for pt about appointment details and call back number were left

## 2021-12-01 ENCOUNTER — Encounter: Payer: Self-pay | Admitting: Internal Medicine

## 2021-12-01 DIAGNOSIS — H25813 Combined forms of age-related cataract, bilateral: Secondary | ICD-10-CM | POA: Diagnosis not present

## 2021-12-01 DIAGNOSIS — H04123 Dry eye syndrome of bilateral lacrimal glands: Secondary | ICD-10-CM | POA: Diagnosis not present

## 2021-12-01 DIAGNOSIS — H524 Presbyopia: Secondary | ICD-10-CM | POA: Diagnosis not present

## 2021-12-01 DIAGNOSIS — R7309 Other abnormal glucose: Secondary | ICD-10-CM | POA: Diagnosis not present

## 2021-12-01 LAB — HM DIABETES EYE EXAM

## 2021-12-14 ENCOUNTER — Ambulatory Visit: Payer: BC Managed Care – PPO | Admitting: Internal Medicine

## 2021-12-18 ENCOUNTER — Other Ambulatory Visit: Payer: Self-pay | Admitting: Internal Medicine

## 2021-12-18 DIAGNOSIS — F411 Generalized anxiety disorder: Secondary | ICD-10-CM

## 2022-01-26 ENCOUNTER — Inpatient Hospital Stay: Payer: BC Managed Care – PPO | Admitting: Hematology and Oncology

## 2022-01-26 ENCOUNTER — Inpatient Hospital Stay: Payer: BC Managed Care – PPO | Attending: Hematology and Oncology

## 2022-01-26 ENCOUNTER — Other Ambulatory Visit: Payer: Self-pay | Admitting: Hematology and Oncology

## 2022-01-26 DIAGNOSIS — D500 Iron deficiency anemia secondary to blood loss (chronic): Secondary | ICD-10-CM

## 2022-03-10 DIAGNOSIS — F4312 Post-traumatic stress disorder, chronic: Secondary | ICD-10-CM | POA: Diagnosis not present

## 2022-03-10 DIAGNOSIS — F4381 Prolonged grief disorder: Secondary | ICD-10-CM | POA: Diagnosis not present

## 2022-03-10 DIAGNOSIS — F411 Generalized anxiety disorder: Secondary | ICD-10-CM | POA: Diagnosis not present

## 2022-03-10 DIAGNOSIS — F332 Major depressive disorder, recurrent severe without psychotic features: Secondary | ICD-10-CM | POA: Diagnosis not present

## 2022-06-28 ENCOUNTER — Ambulatory Visit
Admission: EM | Admit: 2022-06-28 | Discharge: 2022-06-28 | Disposition: A | Discharge status: Home / Self Care | Payer: BC Managed Care – PPO

## 2022-06-28 DIAGNOSIS — H5713 Ocular pain, bilateral: Secondary | ICD-10-CM

## 2022-06-28 DIAGNOSIS — H538 Other visual disturbances: Secondary | ICD-10-CM | POA: Diagnosis not present

## 2022-06-28 DIAGNOSIS — T2691XA Corrosion of right eye and adnexa, part unspecified, initial encounter: Secondary | ICD-10-CM | POA: Diagnosis not present

## 2022-06-28 NOTE — Discharge Instructions (Signed)
Go to [facility] today for further evaluation.

## 2022-06-28 NOTE — ED Triage Notes (Signed)
Pt presents with blurry vision and pain with both eyes after using OTC eye drops that she has used before.

## 2022-06-28 NOTE — ED Provider Notes (Signed)
[facility]    CSN: [id number] Arrival date & time: 06/28/22  1326      History   Chief Complaint Chief Complaint  Patient presents with   Eye Problem    HPI SIEANNA FATTA is a 47 y.o. female.   Patient presents with blurry vision and bilateral eye pain that started about 2 hours prior to arrival to urgent care. Patient states that she used 2 different eyedrops.  Patient reports that she used Systane and family care eye drops that contain dextran, Polyethylene glycol, povidone, and tetrahydrozoline.  States that she has used these eyedrops previously and tolerated well.  Neither one of them are new bottles.  She uses them for dry eye given that she stares at a computer screen daily.  She reports that she used them back to back and it started directly after.  Patient states that she is having difficulty opening her eyes as she has intense pain.  Does not wear contacts or glasses.  Blood pressure is elevated.  Patient denies that she has taken her blood pressure medication today.  Patient is not reporting chest pain, shortness of breath, headache, nausea, vomiting.   Eye Problem   Past Medical History:  Diagnosis Date   Acute respiratory failure ([place]) 12/29/2017   Anxiety    Asthma    Gestational diabetes    Hypertension    Iron deficiency anemia    Obesity     Patient Active Problem List   Diagnosis Date Noted   Iron deficiency anemia due to chronic blood loss 06/28/2021   Gastroesophageal reflux disease with esophagitis without hemorrhage 06/28/2021   Encounter for general adult medical examination with abnormal findings 06/27/2021   Malignant hypertension 06/27/2021   Type 2 diabetes mellitus without complication, without long-term current use of insulin ([place]) 06/27/2021   Hyperlipidemia LDL goal <100 06/27/2021   GAD (generalized anxiety disorder) 06/27/2021   Visit for screening mammogram 06/27/2021   Screening for cervical cancer 06/27/2021   Loud  snoring 06/27/2021   Need for vaccination 06/27/2021   GERD (gastroesophageal reflux disease) 06/09/2018   Severe persistent asthma without complication [id number]   Obesity 12/29/2017    Past Surgical History:  Procedure Laterality Date   TONSILLECTOMY     TUBAL LIGATION N/A 10/07/2018   Procedure: POST PARTUM TUBAL LIGATION;  Surgeon: Truett Mainland, DO;  Location: [facility] LD ORS;  Service: Gynecology;  Laterality: N/A;    OB History     Gravida  6   Para  2   Term  2   Preterm  0   AB  3   Living  2      SAB  2   IAB  1   Ectopic  0   Multiple      Live Births  2            Home Medications    Prior to Admission medications   Medication Sig Start Date End Date Taking? Authorizing Provider  albuterol (VENTOLIN HFA) 108 (90 Base) MCG/ACT inhaler Inhale 1-2 puffs into the lungs every 6 (six) hours as needed for wheezing or shortness of breath. 04/11/21   Francene Finders, PA-C  clonazePAM (KLONOPIN) 1 MG tablet TAKE 1 TABLET BY MOUTH 3 TIMES DAILY. 12/18/21   Biagio Borg, MD  DULoxetine (CYMBALTA) 60 MG capsule Take 1 capsule (60 mg total) by mouth daily. 09/13/21   Janith Lima, MD  Fluticasone-Umeclidin-Vilant (TRELEGY ELLIPTA) 100-62.5-25 MCG/ACT AEPB Inhale  1 puff into the lungs daily. 09/13/21   Janith Lima, MD  ipratropium-albuterol (DUONEB) 0.5-2.5 (3) MG/3ML SOLN Take 3 mLs by nebulization every 4 (four) hours as needed. 02/06/19   Martyn Ehrich, NP  iron polysaccharides (NIFEREX) 150 MG capsule Take 1 capsule (150 mg total) by mouth daily. 06/29/21   Janith Lima, MD  Multiple Vitamin (MULTIVITAMIN) tablet Take by mouth daily.    [provider]  nebivolol (BYSTOLIC) 10 MG tablet Take 1 tablet (10 mg total) by mouth daily. 09/13/21   Janith Lima, MD  omeprazole (PRILOSEC) 40 MG capsule Take 1 capsule (40 mg total) by mouth daily. 06/29/21   Janith Lima, MD  QUEtiapine (SEROQUEL) 25 MG tablet Take 1 tablet (25 mg total) by mouth  at bedtime. 09/13/21   Janith Lima, MD  triamterene-hydrochlorothiazide (DYAZIDE) 37.5-25 MG capsule Take 1 each (1 capsule total) by mouth daily. 09/13/21   Janith Lima, MD    Family History Family History  Problem Relation Age of Onset   Diabetes Mother    Hypertension Mother    Hypertension Father    COPD Father     Social History Social History   Tobacco Use   Smoking status: Former    Packs/day: 0.25    Types: Cigarettes    Quit date: 01/06/2018    Years since quitting: 4.4    Passive exposure: Past   Smokeless tobacco: Never   Tobacco comments:    stopped smoking after this hospital admission  Vaping Use   Vaping Use: Never used  Substance Use Topics   Alcohol use: Not Currently   Drug use: Not Currently    Types: Marijuana    Comment: last smoked 09/26/2018     Allergies   Patient has no known allergies.   Review of Systems Review of Systems Per HPI  Physical Exam Triage Vital Signs ED Triage Vitals  Enc Vitals Group     BP 06/28/22 1421 (!) 167/104     Pulse Rate 06/28/22 1420 84     Resp 06/28/22 1420 18     Temp 06/28/22 1420 97.9 F (36.6 C)     Temp Source 06/28/22 1420 Oral     SpO2 06/28/22 1420 97 %     Weight --      Height --      Head Circumference --      Peak Flow --      Pain Score 06/28/22 1419 7     Pain Loc --      Pain Edu? --      Excl. in Normal? --    No data found.  Updated Vital Signs BP (!) 167/104   Pulse 84   Temp 97.9 F (36.6 C) (Oral)   Resp 18   SpO2 97%   Visual Acuity Right Eye Distance:   Left Eye Distance:   Bilateral Distance:    Right Eye Near:   Left Eye Near:    Bilateral Near:     Physical Exam Constitutional:      General: She is not in acute distress.    Appearance: Normal appearance. She is not toxic-appearing or diaphoretic.  HENT:     Head: Normocephalic and atraumatic.  Eyes:     General: Lids are normal. Lids are everted, no foreign bodies appreciated. Gaze aligned  appropriately.     Extraocular Movements: Extraocular movements intact.     Conjunctiva/sclera: Conjunctivae normal.     Pupils:  Pupils are equal, round, and reactive to light.     Comments: Patient having difficulty opening eyes.  Limited evaluation due to patient's pain and cooperation.  Eyeball appears normal with no scleral redness.  Pupils are normal.  No swelling or discoloration of eyelids.  Eyes are profusely watering.  Pulmonary:     Effort: Pulmonary effort is normal.  Neurological:     General: No focal deficit present.     Mental Status: She is alert and oriented to person, place, and time. Mental status is at baseline.  Psychiatric:        Mood and Affect: Mood normal.        Behavior: Behavior normal.        Thought Content: Thought content normal.        Judgment: Judgment normal.      UC Treatments / Results  Labs (all labs ordered are listed, but only abnormal results are displayed) Labs Reviewed - No data to display  EKG   Radiology No results found.  Procedures Procedures (including critical care time)  Medications Ordered in UC Medications - No data to display  Initial Impression / Assessment and Plan / UC Course  I have reviewed the triage vital signs and the nursing notes.  Pertinent labs & imaging results that were available during my care of the patient were reviewed by me and considered in my medical decision making (see chart for details).     Called Dr. Manuella Ghazi with on-call ophthalmology to discuss patient's symptoms.  He advised that he would like to see her today at his office.  Therefore, patient was advised to go straight to [facility] as soon as possible for further evaluation and management.  She was agreeable with this plan and left via her friend transporting her.  Patient's blood pressure is elevated.  She reports that she has not taken her blood pressure medication today.  Advised patient to ensure that she gets her blood  pressure medication in her as soon as possible.  Advised monitoring blood pressure at home and following up with PCP or urgent care if it remains elevated.  Blood pressure does appear baseline after further review of chart.  Asymptomatic regarding blood pressure at this time.  Therefore, do not think that any emergent evaluation is necessary. Final Clinical Impressions(s) / UC Diagnoses   Final diagnoses:  Blurry vision  Pain of both eyes     Discharge Instructions      Go to [facility] today for further evaluation.     ED Prescriptions   None    PDMP not reviewed this encounter.   Teodora Medici, [place] 06/28/22 1452

## 2022-07-02 DIAGNOSIS — T2691XA Corrosion of right eye and adnexa, part unspecified, initial encounter: Secondary | ICD-10-CM | POA: Diagnosis not present

## 2022-09-29 ENCOUNTER — Encounter: Payer: Self-pay | Admitting: Internal Medicine

## 2022-09-29 ENCOUNTER — Encounter: Payer: Self-pay | Admitting: Emergency Medicine

## 2022-09-29 ENCOUNTER — Other Ambulatory Visit: Payer: Self-pay

## 2022-09-29 ENCOUNTER — Ambulatory Visit
Admission: EM | Admit: 2022-09-29 | Discharge: 2022-09-29 | Disposition: A | Discharge status: Home / Self Care | Payer: BC Managed Care – PPO | Attending: Family Medicine | Admitting: Family Medicine

## 2022-09-29 ENCOUNTER — Encounter: Payer: Self-pay | Admitting: Hematology and Oncology

## 2022-09-29 DIAGNOSIS — R519 Headache, unspecified: Secondary | ICD-10-CM | POA: Diagnosis not present

## 2022-09-29 DIAGNOSIS — I10 Essential (primary) hypertension: Secondary | ICD-10-CM | POA: Diagnosis not present

## 2022-09-29 MED ORDER — NEBIVOLOL HCL 20 MG PO TABS: 20.0000 mg | ORAL_TABLET | Freq: Every day | ORAL | 0 refills | Status: DC

## 2022-09-29 NOTE — ED Triage Notes (Signed)
Pt here for HA and fatigue x 1 week; pt sts noted BP was elevated at home and is today; pt sts taking meds

## 2022-09-29 NOTE — Discharge Instructions (Addendum)
Schedule a follow-up with your primary care doctor to  have your blood pressure medications adjusted. I have increased your Bystolic blood pressure from 10 mg to 20 mg.  Start taking the 20 mg dose immediately.  Schedule follow-up with your primary care doctor on Monday to address your blood pressure. If you continue to have low readings greater than>200/100, and they are not improving with taking the increased dose of your blood pressure medication you need to go immediately to the emergency department as this could be a medical emergency can lead to stroke.

## 2022-09-29 NOTE — ED Provider Notes (Signed)
[facility]    CSN: [id number] Arrival date & time: 09/29/22  0847      History   Chief Complaint Chief Complaint  Patient presents with   Headache    HPI Jenna Stephens is a 47 y.o. female.   HPI Patient with a history of morbid obesity, malignant hypertension presents today with headache and blood pressure of 187/112.  She reports she has had some blood pressure readings at home which have been in excess of 200 systolic greater than 100 diastolic. She reports recent stress which she attributes to headaches and increased blood pressure. Endorses that she is taking both of her blood pressure medications as prescribed. She reports that she has not scheduled a blood pressure follow-up since her last BP medications were prescribed. Denies chest pain, shortness of breath, or new swelling. She is established with Dr. Sanda Linger at [facility].    Past Medical History:  Diagnosis Date   Acute respiratory failure (HCC) 12/29/2017   Anxiety    Asthma    Gestational diabetes    Hypertension    Iron deficiency anemia    Obesity     Patient Active Problem List   Diagnosis Date Noted   Iron deficiency anemia due to chronic blood loss 06/28/2021   Gastroesophageal reflux disease with esophagitis without hemorrhage 06/28/2021   Encounter for general adult medical examination with abnormal findings 06/27/2021   Malignant hypertension 06/27/2021   Type 2 diabetes mellitus without complication, without long-term current use of insulin (HCC) 06/27/2021   Hyperlipidemia LDL goal <100 06/27/2021   GAD (generalized anxiety disorder) 06/27/2021   Visit for screening mammogram 06/27/2021   Screening for cervical cancer 06/27/2021   Loud snoring 06/27/2021   Need for vaccination 06/27/2021   GERD (gastroesophageal reflux disease) 06/09/2018   Severe persistent asthma without complication 02/03/2018   Obesity 12/29/2017    Past Surgical History:  Procedure Laterality  Date   TONSILLECTOMY     TUBAL LIGATION N/A 10/07/2018   Procedure: POST PARTUM TUBAL LIGATION;  Surgeon: Levie Heritage, DO;  Location: [facility];  Service: Gynecology;  Laterality: N/A;    OB History     Gravida  6   Para  2   Term  2   Preterm  0   AB  3   Living  2      SAB  2   IAB  1   Ectopic  0   Multiple      Live Births  2            Home Medications    Prior to Admission medications   Medication Sig Start Date End Date Taking? Authorizing Provider  Nebivolol HCl 20 MG TABS Take 1 tablet (20 mg total) by mouth daily. 09/29/22  Yes Bing Neighbors, NP  albuterol (VENTOLIN HFA) 108 (90 Base) MCG/ACT inhaler Inhale 1-2 puffs into the lungs every 6 (six) hours as needed for wheezing or shortness of breath. 04/11/21   Tomi Bamberger, PA-C  clonazePAM (KLONOPIN) 1 MG tablet TAKE 1 TABLET BY MOUTH 3 TIMES DAILY. 12/18/21   Corwin Levins, MD  DULoxetine (CYMBALTA) 60 MG capsule Take 1 capsule (60 mg total) by mouth daily. 09/13/21   Etta Grandchild, MD  Fluticasone-Umeclidin-Vilant (TRELEGY ELLIPTA) 100-62.5-25 MCG/ACT AEPB Inhale 1 puff into the lungs daily. 09/13/21   Etta Grandchild, MD  ipratropium-albuterol (DUONEB) 0.5-2.5 (3) MG/3ML SOLN Take 3 mLs by nebulization every 4 (  four) hours as needed. 02/06/19   Glenford Bayley, NP  iron polysaccharides (NIFEREX) 150 MG capsule Take 1 capsule (150 mg total) by mouth daily. 06/29/21   Etta Grandchild, MD  Multiple Vitamin (MULTIVITAMIN) tablet Take by mouth daily.    [provider]  omeprazole (PRILOSEC) 40 MG capsule Take 1 capsule (40 mg total) by mouth daily. 06/29/21   Etta Grandchild, MD  QUEtiapine (SEROQUEL) 25 MG tablet Take 1 tablet (25 mg total) by mouth at bedtime. 09/13/21   Etta Grandchild, MD  triamterene-hydrochlorothiazide (DYAZIDE) 37.5-25 MG capsule Take 1 each (1 capsule total) by mouth daily. 09/13/21   Etta Grandchild, MD    Family History Family History  Problem Relation Age of  Onset   Diabetes Mother    Hypertension Mother    Hypertension Father    COPD Father     Social History Social History   Tobacco Use   Smoking status: Former    Packs/day: .25    Types: Cigarettes    Quit date: 01/06/2018    Years since quitting: 4.7    Passive exposure: Past   Smokeless tobacco: Never   Tobacco comments:    stopped smoking after this hospital admission  Vaping Use   Vaping Use: Never used  Substance Use Topics   Alcohol use: Not Currently   Drug use: Not Currently    Types: Marijuana    Comment: last smoked 09/26/2018     Allergies   Patient has no known allergies.   Review of Systems Review of Systems Pertinent negatives listed in HPI  Physical Exam Triage Vital Signs ED Triage Vitals [09/29/22 0916]  Enc Vitals Group     BP (!) 187/112     Pulse Rate 95     Resp 18     Temp 98 F (36.7 C)     Temp Source Oral     SpO2 99 %     Weight      Height      Head Circumference      Peak Flow      Pain Score 5     Pain Loc      Pain Edu?      Excl. in GC?    No data found.  Updated Vital Signs BP (!) 187/112 (BP Location: Left Arm)   Pulse 95   Temp 98 F (36.7 C) (Oral)   Resp 18   SpO2 99%   Visual Acuity Right Eye Distance:   Left Eye Distance:   Bilateral Distance:    Right Eye Near:   Left Eye Near:    Bilateral Near:     Physical Exam Vitals reviewed.  Constitutional:      General: She is not in acute distress.    Appearance: She is obese. She is not toxic-appearing.  HENT:     Head: Normocephalic and atraumatic.  Eyes:     Extraocular Movements: Extraocular movements intact.     Pupils: Pupils are equal, round, and reactive to light.  Cardiovascular:     Rate and Rhythm: Normal rate and regular rhythm.  Pulmonary:     Effort: Pulmonary effort is normal.     Breath sounds: Normal breath sounds.  Musculoskeletal:     Cervical back: Normal range of motion. No rigidity.  Lymphadenopathy:     Cervical: No cervical  adenopathy.  Skin:    General: Skin is warm and dry.  Neurological:     Mental  Status: She is alert and oriented to person, place, and time.      UC Treatments / Results  Labs (all labs ordered are listed, but only abnormal results are displayed) Labs Reviewed - No data to display  EKG   Radiology No results found.  Procedures Procedures (including critical care time)  Medications Ordered in UC Medications - No data to display  Initial Impression / Assessment and Plan / UC Course  I have reviewed the triage vital signs and the nursing notes.  Pertinent labs & imaging results that were available during my care of the patient were reviewed by me and considered in my medical decision making (see chart for details).    Malignant hypertension, patient with known history of poorly controlled BP. Increased Bystolic 10 mg to 20 mg.  Encouraged patient to contact primary care provider to schedule  blood pressure follow-up. ER if BP at home continued to be >200/100.  Final Clinical Impressions(s) / UC Diagnoses   Final diagnoses:  Malignant hypertension  Intractable episodic headache, unspecified headache type     Discharge Instructions      Schedule a follow-up with your primary care doctor to  have your blood pressure medications adjusted. I have increased your Bystolic blood pressure from 10 mg to 20 mg.  Start taking the 20 mg dose immediately.  Schedule follow-up with your primary care doctor on Monday to address your blood pressure. If you continue to have low readings greater than>200/100, and they are not improving with taking the increased dose of your blood pressure medication you need to go immediately to the emergency department as this could be a medical emergency can lead to stroke.     ED Prescriptions     Medication Sig Dispense Auth. Provider   Nebivolol HCl 20 MG TABS Take 1 tablet (20 mg total) by mouth daily. 30 tablet Bing Neighbors, NP       PDMP not reviewed this encounter.   Bing Neighbors, NP 10/01/22 2352

## 2022-10-03 ENCOUNTER — Ambulatory Visit (INDEPENDENT_AMBULATORY_CARE_PROVIDER_SITE_OTHER): Payer: BC Managed Care – PPO | Admitting: Internal Medicine

## 2022-10-03 ENCOUNTER — Encounter: Payer: Self-pay | Admitting: Internal Medicine

## 2022-10-03 VITALS — BP 152/110 | HR 61 | Temp 98.3°F | Resp 16 | Ht 67.0 in | Wt 284.0 lb

## 2022-10-03 DIAGNOSIS — F411 Generalized anxiety disorder: Secondary | ICD-10-CM

## 2022-10-03 DIAGNOSIS — Z6838 Body mass index (BMI) 38.0-38.9, adult: Secondary | ICD-10-CM | POA: Diagnosis not present

## 2022-10-03 DIAGNOSIS — I10 Essential (primary) hypertension: Secondary | ICD-10-CM | POA: Diagnosis not present

## 2022-10-03 DIAGNOSIS — E119 Type 2 diabetes mellitus without complications: Secondary | ICD-10-CM | POA: Diagnosis not present

## 2022-10-03 DIAGNOSIS — E669 Obesity, unspecified: Secondary | ICD-10-CM

## 2022-10-03 DIAGNOSIS — D500 Iron deficiency anemia secondary to blood loss (chronic): Secondary | ICD-10-CM

## 2022-10-03 DIAGNOSIS — J4550 Severe persistent asthma, uncomplicated: Secondary | ICD-10-CM

## 2022-10-03 DIAGNOSIS — Z794 Long term (current) use of insulin: Secondary | ICD-10-CM

## 2022-10-03 DIAGNOSIS — E785 Hyperlipidemia, unspecified: Secondary | ICD-10-CM

## 2022-10-03 DIAGNOSIS — F332 Major depressive disorder, recurrent severe without psychotic features: Secondary | ICD-10-CM

## 2022-10-03 LAB — URINALYSIS, ROUTINE W REFLEX MICROSCOPIC
Bilirubin Urine: NEGATIVE
Hgb urine dipstick: NEGATIVE
Ketones, ur: NEGATIVE
Leukocytes,Ua: NEGATIVE
Nitrite: NEGATIVE
RBC / HPF: NONE SEEN (ref 0–?)
Specific Gravity, Urine: 1.025 (ref 1.000–1.030)
Total Protein, Urine: NEGATIVE
Urine Glucose: NEGATIVE
Urobilinogen, UA: 0.2 (ref 0.0–1.0)
pH: 6 (ref 5.0–8.0)

## 2022-10-03 LAB — LIPID PANEL
Cholesterol: 230 mg/dL — ABNORMAL HIGH (ref 0–200)
HDL: 46.9 mg/dL (ref >39.00)
LDL Cholesterol: 144 mg/dL — ABNORMAL HIGH (ref 0–99)
NonHDL: 183.47
Total CHOL/HDL Ratio: 5
Triglycerides: 197 mg/dL — ABNORMAL HIGH (ref 0.0–149.0)
VLDL: 39.4 mg/dL (ref 0.0–40.0)

## 2022-10-03 LAB — BASIC METABOLIC PANEL
BUN: 14 mg/dL (ref 6–23)
CO2: 25 mEq/L (ref 19–32)
Calcium: 9 mg/dL (ref 8.4–10.5)
Chloride: 104 mEq/L (ref 96–112)
Creatinine, Ser: 0.82 mg/dL (ref 0.40–1.20)
GFR: 86.75 mL/min (ref >60.00)
Glucose, Bld: 106 mg/dL — ABNORMAL HIGH (ref 70–99)
Potassium: 3.4 mEq/L — ABNORMAL LOW (ref 3.5–5.1)
Sodium: 137 mEq/L (ref 135–145)

## 2022-10-03 LAB — CBC WITH DIFFERENTIAL/PLATELET
Basophils Absolute: 0.1 10*3/uL (ref 0.0–0.1)
Basophils Relative: 1 % (ref 0.0–3.0)
Eosinophils Absolute: 0.1 10*3/uL (ref 0.0–0.7)
Eosinophils Relative: 1.1 % (ref 0.0–5.0)
HCT: 37.4 % (ref 36.0–46.0)
Hemoglobin: 11.3 g/dL — ABNORMAL LOW (ref 12.0–15.0)
Lymphocytes Relative: 27.6 % (ref 12.0–46.0)
Lymphs Abs: 3.2 10*3/uL (ref 0.7–4.0)
MCHC: 30.2 g/dL (ref 30.0–36.0)
MCV: 74.4 fl — ABNORMAL LOW (ref 78.0–100.0)
Monocytes Absolute: 0.7 10*3/uL (ref 0.1–1.0)
Monocytes Relative: 6.1 % (ref 3.0–12.0)
Neutro Abs: 7.5 10*3/uL (ref 1.4–7.7)
Neutrophils Relative %: 64.2 % (ref 43.0–77.0)
Platelets: 282 10*3/uL (ref 150.0–400.0)
RBC: 5.03 Mil/uL (ref 3.87–5.11)
RDW: 15.5 % (ref 11.5–15.5)
WBC: 11.6 10*3/uL — ABNORMAL HIGH (ref 4.0–10.5)

## 2022-10-03 LAB — TSH: TSH: 0.97 u[IU]/mL (ref 0.35–5.50)

## 2022-10-03 LAB — IBC + FERRITIN
Ferritin: 5.9 ng/mL — ABNORMAL LOW (ref 10.0–291.0)
Iron: 45 ug/dL (ref 42–145)
Saturation Ratios: 8.6 % — ABNORMAL LOW (ref 20.0–50.0)
TIBC: 523.6 ug/dL — ABNORMAL HIGH (ref 250.0–450.0)
Transferrin: 374 mg/dL — ABNORMAL HIGH (ref 212.0–360.0)

## 2022-10-03 LAB — HEPATIC FUNCTION PANEL
ALT: 14 U/L (ref 0–35)
AST: 16 U/L (ref 0–37)
Albumin: 4.1 g/dL (ref 3.5–5.2)
Alkaline Phosphatase: 55 U/L (ref 39–117)
Bilirubin, Direct: 0.1 mg/dL (ref 0.0–0.3)
Total Bilirubin: 0.5 mg/dL (ref 0.2–1.2)
Total Protein: 7.3 g/dL (ref 6.0–8.3)

## 2022-10-03 LAB — HEMOGLOBIN A1C: Hgb A1c MFr Bld: 6 % (ref 4.6–6.5)

## 2022-10-03 MED ORDER — VILAZODONE HCL 10 MG PO TABS
10.0000 mg | ORAL_TABLET | Freq: Every day | ORAL | 0 refills | Status: DC
Start: 2022-10-03 — End: 2022-10-03

## 2022-10-03 MED ORDER — CARIPRAZINE HCL 1.5 MG PO CAPS
1.5000 mg | ORAL_CAPSULE | Freq: Every day | ORAL | 0 refills | Status: DC
Start: 2022-10-03 — End: 2023-12-26

## 2022-10-03 MED ORDER — ACCRUFER 30 MG PO CAPS
1.0000 | ORAL_CAPSULE | Freq: Two times a day (BID) | ORAL | 1 refills | Status: DC
Start: 2022-10-03 — End: 2022-11-06

## 2022-10-03 MED ORDER — TRIAMTERENE-HCTZ 37.5-25 MG PO CAPS
1.0000 | ORAL_CAPSULE | Freq: Every day | ORAL | 0 refills | Status: DC
Start: 2022-10-03 — End: 2022-12-25

## 2022-10-03 MED ORDER — VILAZODONE HCL 10 MG PO TABS: 10.0000 mg | ORAL_TABLET | Freq: Every day | ORAL | 0 refills | Status: DC

## 2022-10-03 MED ORDER — CLONAZEPAM 1 MG PO TABS: 1.0000 mg | ORAL_TABLET | Freq: Three times a day (TID) | ORAL | 1 refills | Status: DC

## 2022-10-03 MED ORDER — ROSUVASTATIN CALCIUM 20 MG PO TABS
20.0000 mg | ORAL_TABLET | Freq: Every day | ORAL | 0 refills | Status: DC
Start: 2022-10-03 — End: 2022-10-12

## 2022-10-03 MED ORDER — ASPIRIN 81 MG PO TBEC
81.0000 mg | DELAYED_RELEASE_TABLET | Freq: Every day | ORAL | 1 refills | Status: DC
Start: 2022-10-03 — End: 2023-03-30

## 2022-10-03 NOTE — Patient Instructions (Signed)
Hypertension, Adult High blood pressure (hypertension) is when the force of blood pumping through the arteries is too strong. The arteries are the blood vessels that carry blood from the heart throughout the body. Hypertension forces the heart to work harder to pump blood and may cause arteries to become narrow or stiff. Untreated or uncontrolled hypertension can lead to a heart attack, heart failure, a stroke, kidney disease, and other problems. A blood pressure reading consists of a higher number over a lower number. Ideally, your blood pressure should be below 120/80. The first ("top") number is called the systolic pressure. It is a measure of the pressure in your arteries as your heart beats. The second ("bottom") number is called the diastolic pressure. It is a measure of the pressure in your arteries as the heart relaxes. What are the causes? The exact cause of this condition is not known. There are some conditions that result in high blood pressure. What increases the risk? Certain factors may make you more likely to develop high blood pressure. Some of these risk factors are under your control, including: Smoking. Not getting enough exercise or physical activity. Being overweight. Having too much fat, sugar, calories, or salt (sodium) in your diet. Drinking too much alcohol. Other risk factors include: Having a personal history of heart disease, diabetes, high cholesterol, or kidney disease. Stress. Having a family history of high blood pressure and high cholesterol. Having obstructive sleep apnea. Age. The risk increases with age. What are the signs or symptoms? High blood pressure may not cause symptoms. Very high blood pressure (hypertensive crisis) may cause: Headache. Fast or irregular heartbeats (palpitations). Shortness of breath. Nosebleed. Nausea and vomiting. Vision changes. Severe chest pain, dizziness, and seizures. How is this diagnosed? This condition is diagnosed by  measuring your blood pressure while you are seated, with your arm resting on a flat surface, your legs uncrossed, and your feet flat on the floor. The cuff of the blood pressure monitor will be placed directly against the skin of your upper arm at the level of your heart. Blood pressure should be measured at least twice using the same arm. Certain conditions can cause a difference in blood pressure between your right and left arms. If you have a high blood pressure reading during one visit or you have normal blood pressure with other risk factors, you may be asked to: Return on a different day to have your blood pressure checked again. Monitor your blood pressure at home for 1 week or longer. If you are diagnosed with hypertension, you may have other blood or imaging tests to help your health care provider understand your overall risk for other conditions. How is this treated? This condition is treated by making healthy lifestyle changes, such as eating healthy foods, exercising more, and reducing your alcohol intake. You may be referred for counseling on a healthy diet and physical activity. Your health care provider may prescribe medicine if lifestyle changes are not enough to get your blood pressure under control and if: Your systolic blood pressure is above 130. Your diastolic blood pressure is above 80. Your personal target blood pressure may vary depending on your medical conditions, your age, and other factors. Follow these instructions at home: Eating and drinking  Eat a diet that is high in fiber and potassium, and low in sodium, added sugar, and fat. An example of this eating plan is called the DASH diet. DASH stands for Dietary Approaches to Stop Hypertension. To eat this way: Eat   plenty of fresh fruits and vegetables. Try to fill one half of your plate at each meal with fruits and vegetables. Eat whole grains, such as whole-wheat pasta, brown rice, or whole-grain bread. Fill about one  fourth of your plate with whole grains. Eat or drink low-fat dairy products, such as skim milk or low-fat yogurt. Avoid fatty cuts of meat, processed or cured meats, and poultry with skin. Fill about one fourth of your plate with lean proteins, such as fish, chicken without skin, beans, eggs, or tofu. Avoid pre-made and processed foods. These tend to be higher in sodium, added sugar, and fat. Reduce your daily sodium intake. Many people with hypertension should eat less than 1,500 mg of sodium a day. Do not drink alcohol if: Your health care provider tells you not to drink. You are pregnant, may be pregnant, or are planning to become pregnant. If you drink alcohol: Limit how much you have to: 0-1 drink a day for women. 0-2 drinks a day for men. Know how much alcohol is in your drink. In the [place] one drink equals one 12 oz bottle of beer (355 mL), one 5 oz glass of wine (148 mL), or one 1 oz glass of hard liquor (44 mL). Lifestyle  Work with your health care provider to maintain a healthy body weight or to lose weight. Ask what an ideal weight is for you. Get at least 30 minutes of exercise that causes your heart to beat faster (aerobic exercise) most days of the week. Activities may include walking, swimming, or biking. Include exercise to strengthen your muscles (resistance exercise), such as Pilates or lifting weights, as part of your weekly exercise routine. Try to do these types of exercises for 30 minutes at least 3 days a week. Do not use any products that contain nicotine or tobacco. These products include cigarettes, chewing tobacco, and vaping devices, such as e-cigarettes. If you need help quitting, ask your health care provider. Monitor your blood pressure at home as told by your health care provider. Keep all follow-up visits. This is important. Medicines Take over-the-counter and prescription medicines only as told by your health care provider. Follow directions carefully. Blood  pressure medicines must be taken as prescribed. Do not skip doses of blood pressure medicine. Doing this puts you at risk for problems and can make the medicine less effective. Ask your health care provider about side effects or reactions to medicines that you should watch for. Contact a health care provider if you: Think you are having a reaction to a medicine you are taking. Have headaches that keep coming back (recurring). Feel dizzy. Have swelling in your ankles. Have trouble with your vision. Get help right away if you: Develop a severe headache or confusion. Have unusual weakness or numbness. Feel faint. Have severe pain in your chest or abdomen. Vomit repeatedly. Have trouble breathing. These symptoms may be an emergency. Get help right away. Call 911. Do not wait to see if the symptoms will go away. Do not drive yourself to the hospital. Summary Hypertension is when the force of blood pumping through your arteries is too strong. If this condition is not controlled, it may put you at risk for serious complications. Your personal target blood pressure may vary depending on your medical conditions, your age, and other factors. For most people, a normal blood pressure is less than 120/80. Hypertension is treated with lifestyle changes, medicines, or a combination of both. Lifestyle changes include losing weight, eating a healthy,   low-sodium diet, exercising more, and limiting alcohol. This information is not intended to replace advice given to you by your health care provider. Make sure you discuss any questions you have with your health care provider. Document Revised: 02/14/2021 Document Reviewed: 02/14/2021 Elsevier Patient Education  2024 [employer]vier Inc.  

## 2022-10-03 NOTE — Progress Notes (Signed)
Subjective:  Patient ID: Jenna Stephens, female    DOB: October 17, 1975  Age: 47 y.o. MRN: [id number]  CC: Anemia   HPI Jenna Stephens presents for f/up ---  She complains of a several week history of headache, blurred vision, palpitations, sweating palms, dyspnea on exertion, weakness, diaphoresis, and hair loss.  She feels stressed because her daughter has been acting out by cutting herself.  She recently ran out of Klonopin and has been taking CBD edibles.  She has not been taking Dyazide.  Outpatient Medications Prior to Visit  Medication Sig Dispense Refill   albuterol (VENTOLIN HFA) 108 (90 Base) MCG/ACT inhaler Inhale 1-2 puffs into the lungs every 6 (six) hours as needed for wheezing or shortness of breath. 8 g 0   Fluticasone-Umeclidin-Vilant (TRELEGY ELLIPTA) 100-62.5-25 MCG/ACT AEPB Inhale 1 puff into the lungs daily. 120 each 1   ipratropium-albuterol (DUONEB) 0.5-2.5 (3) MG/3ML SOLN Take 3 mLs by nebulization every 4 (four) hours as needed. 360 mL 1   Multiple Vitamin (MULTIVITAMIN) tablet Take by mouth daily.     Nebivolol HCl 20 MG TABS Take 1 tablet (20 mg total) by mouth daily. 30 tablet 0   omeprazole (PRILOSEC) 40 MG capsule Take 1 capsule (40 mg total) by mouth daily. 90 capsule 1   clonazePAM (KLONOPIN) 1 MG tablet TAKE 1 TABLET BY MOUTH 3 TIMES DAILY. 90 tablet 0   DULoxetine (CYMBALTA) 60 MG capsule Take 1 capsule (60 mg total) by mouth daily. 90 capsule 1   iron polysaccharides (NIFEREX) 150 MG capsule Take 1 capsule (150 mg total) by mouth daily. 180 capsule 1   QUEtiapine (SEROQUEL) 25 MG tablet Take 1 tablet (25 mg total) by mouth at bedtime. 90 tablet 1   triamterene-hydrochlorothiazide (DYAZIDE) 37.5-25 MG capsule Take 1 each (1 capsule total) by mouth daily. 90 capsule 1   No facility-administered medications prior to visit.    ROS Review of Systems  Constitutional:  Positive for diaphoresis, fatigue and unexpected weight change (wt gain). Negative for  appetite change and chills.  Eyes:  Positive for visual disturbance. Negative for photophobia and pain.  Respiratory:  Positive for shortness of breath. Negative for cough, choking, chest tightness and wheezing.   Cardiovascular:  Positive for palpitations. Negative for leg swelling.  Gastrointestinal:  Negative for abdominal pain, constipation, diarrhea, nausea and vomiting.  Endocrine: Negative.   Genitourinary: Negative.  Negative for difficulty urinating.  Musculoskeletal: Negative.  Negative for back pain and myalgias.  Skin: Negative.  Negative for rash.  Neurological:  Positive for dizziness, weakness and headaches.  Psychiatric/Behavioral:  Positive for dysphoric mood and sleep disturbance. Negative for behavioral problems, confusion, decreased concentration, hallucinations, self-injury and suicidal ideas. The patient is nervous/anxious. The patient is not hyperactive.     Objective:  BP (!) 152/110 (BP Location: Left Arm, Patient Position: Sitting, Cuff Size: Large)   Pulse 61   Temp 98.3 F (36.8 C) (Oral)   Resp 16   Ht 5\' 7"  (1.702 m)   Wt 284 lb (128.8 kg)   LMP 09/19/2022 (Approximate)   SpO2 98%   BMI 44.48 kg/m   BP Readings from Last 3 Encounters:  10/03/22 (!) 152/110  09/29/22 (!) 187/112  06/28/22 (!) 167/104    Wt Readings from Last 3 Encounters:  10/03/22 284 lb (128.8 kg)  11/27/21 282 lb 11.2 oz (128.2 kg)  10/30/21 281 lb 9.6 oz (127.7 kg)    Physical Exam Vitals reviewed.  Constitutional:  General: She is not in acute distress.    Appearance: She is obese. She is not ill-appearing, toxic-appearing or diaphoretic.  HENT:     Nose: Nose normal.     Mouth/Throat:     Mouth: Mucous membranes are moist.  Eyes:     General: No scleral icterus.    Conjunctiva/sclera: Conjunctivae normal.  Cardiovascular:     Rate and Rhythm: Normal rate and regular rhythm.     Heart sounds: Normal heart sounds, S1 normal and S2 normal. No murmur heard.     Comments: EKG- NSR, 68 bpm No LVH, Q waves, or ST/T wave changes Normal EKG Pulmonary:     Effort: Pulmonary effort is normal.     Breath sounds: No stridor. No wheezing, rhonchi or rales.  Abdominal:     General: Abdomen is protuberant. Bowel sounds are normal. There is no distension.     Palpations: Abdomen is soft. There is no hepatomegaly, splenomegaly or mass.     Tenderness: There is no abdominal tenderness.  Musculoskeletal:        General: No swelling or deformity.     Cervical back: Neck supple.     Right lower leg: No edema.     Left lower leg: No edema.  Lymphadenopathy:     Cervical: No cervical adenopathy.  Skin:    General: Skin is warm and dry.  Neurological:     General: No focal deficit present.     Mental Status: Mental status is at baseline.  Psychiatric:        Attention and Perception: She is inattentive.        Mood and Affect: Mood is anxious and depressed. Affect is tearful.        Speech: Speech is tangential. Speech is not delayed.        Behavior: Behavior normal.        Thought Content: Thought content normal. Thought content is not paranoid or delusional. Thought content does not include homicidal or suicidal ideation.        Cognition and Memory: Cognition normal.     Lab Results  Component Value Date   WBC 11.6 (H) 10/03/2022   HGB 11.3 (L) 10/03/2022   HCT 37.4 10/03/2022   PLT 282.0 10/03/2022   GLUCOSE 106 (H) 10/03/2022   CHOL 230 (H) 10/03/2022   TRIG 197.0 (H) 10/03/2022   HDL 46.90 10/03/2022   LDLCALC 144 (H) 10/03/2022   ALT 14 10/03/2022   AST 16 10/03/2022   NA 137 10/03/2022   K 3.4 (L) 10/03/2022   CL 104 10/03/2022   CREATININE 0.82 10/03/2022   BUN 14 10/03/2022   CO2 25 10/03/2022   TSH 0.97 10/03/2022   INR 0.99 11/28/2017   HGBA1C 6.0 10/03/2022   MICROALBUR <0.7 06/27/2021    No results found.  Assessment & Plan:   Malignant hypertension- Her blood pressure is not adequately well-controlled.  Will restart  Dyazide. -     TSH; Future -     Urinalysis, Routine w reflex microscopic; Future -     Hepatic function panel; Future -     Basic metabolic panel; Future -     EKG 12-Lead -     Aldosterone + renin activity w/ ratio; Future -     US RENAL; Future -     Triamterene-HCTZ; Take 1 each (1 capsule total) by mouth daily.  Dispense: 90 capsule; Refill: 0 -     CT CARDIAC SCORING (SELF PAY ONLY);  Future  Iron deficiency anemia due to chronic blood loss -     IBC + Ferritin; Future -     CBC with Differential/Platelet; Future -     ACCRUFeR; Take 1 capsule (30 mg total) by mouth in the morning and at bedtime.  Dispense: 180 capsule; Refill: 1  Hyperlipidemia LDL goal <100- Will start a statin for CV risk reduction. -     Lipid panel; Future -     Rosuvastatin Calcium; Take 1 tablet (20 mg total) by mouth daily.  Dispense: 90 tablet; Refill: 0 -     CT CARDIAC SCORING (SELF PAY ONLY); Future -     Aspirin; Take 1 tablet (81 mg total) by mouth daily. Swallow whole.  Dispense: 90 tablet; Refill: 1  Class 2 obesity with body mass index (BMI) of 38.0 to 38.9 in adult, unspecified obesity type, unspecified whether serious comorbidity present -     TSH; Future -     Hepatic function panel; Future  Type 2 diabetes mellitus without complication, without long-term current use of insulin (HCC) -     Hemoglobin A1c; Future -     Basic metabolic panel; Future  Severe persistent asthma without complication  Severe episode of recurrent major depressive disorder, without psychotic features (HCC) -     Vilazodone HCl; Take 1 tablet (10 mg total) by mouth daily.  Dispense: 7 tablet; Refill: 0 -     Cariprazine HCl; Take 1 capsule (1.5 mg total) by mouth daily.  Dispense: 90 capsule; Refill: 0  GAD (generalized anxiety disorder) -     clonazePAM; Take 1 tablet (1 mg total) by mouth 3 (three) times daily.  Dispense: 90 tablet; Refill: 1     Follow-up: Return in about 6 weeks (around  11/14/2022).  Sanda Linger, MD

## 2022-10-07 ENCOUNTER — Encounter: Payer: Self-pay | Admitting: Internal Medicine

## 2022-10-09 ENCOUNTER — Encounter: Payer: Self-pay | Admitting: Internal Medicine

## 2022-10-10 ENCOUNTER — Other Ambulatory Visit: Payer: Self-pay | Admitting: Internal Medicine

## 2022-10-10 DIAGNOSIS — Z0279 Encounter for issue of other medical certificate: Secondary | ICD-10-CM

## 2022-10-10 DIAGNOSIS — F411 Generalized anxiety disorder: Secondary | ICD-10-CM

## 2022-10-10 DIAGNOSIS — F331 Major depressive disorder, recurrent, moderate: Secondary | ICD-10-CM

## 2022-10-10 NOTE — Telephone Encounter (Signed)
FMLA has been completed and given to PCP to review and sign.  

## 2022-10-11 ENCOUNTER — Ambulatory Visit
Admission: RE | Admit: 2022-10-11 | Discharge: 2022-10-11 | Disposition: A | Discharge status: Home / Self Care | Payer: BC Managed Care – PPO | Source: Ambulatory Visit | Attending: Internal Medicine | Admitting: Internal Medicine

## 2022-10-11 ENCOUNTER — Encounter: Payer: Self-pay | Admitting: Internal Medicine

## 2022-10-11 DIAGNOSIS — I10 Essential (primary) hypertension: Secondary | ICD-10-CM

## 2022-10-11 LAB — ALDOSTERONE + RENIN ACTIVITY W/ RATIO
ALDO / PRA Ratio: 10.3 Ratio (ref 0.9–28.9)
Aldosterone: 3 ng/dL
Renin Activity: 0.29 ng/mL/h (ref 0.25–5.82)

## 2022-10-12 ENCOUNTER — Other Ambulatory Visit: Payer: Self-pay | Admitting: Internal Medicine

## 2022-10-12 DIAGNOSIS — F332 Major depressive disorder, recurrent severe without psychotic features: Secondary | ICD-10-CM

## 2022-10-12 DIAGNOSIS — E785 Hyperlipidemia, unspecified: Secondary | ICD-10-CM

## 2022-10-12 DIAGNOSIS — Z0279 Encounter for issue of other medical certificate: Secondary | ICD-10-CM

## 2022-10-12 DIAGNOSIS — F411 Generalized anxiety disorder: Secondary | ICD-10-CM

## 2022-10-12 MED ORDER — ROSUVASTATIN CALCIUM 20 MG PO TABS: 20.0000 mg | ORAL_TABLET | Freq: Every day | ORAL | 0 refills | Status: DC

## 2022-10-12 MED ORDER — TRAZODONE HCL 50 MG PO TABS
50.0000 mg | ORAL_TABLET | Freq: Every day | ORAL | 0 refills | Status: DC
Start: 2022-10-12 — End: 2022-12-25

## 2022-10-13 ENCOUNTER — Other Ambulatory Visit: Payer: Self-pay | Admitting: Internal Medicine

## 2022-10-13 DIAGNOSIS — F332 Major depressive disorder, recurrent severe without psychotic features: Secondary | ICD-10-CM

## 2022-10-14 ENCOUNTER — Other Ambulatory Visit: Payer: Self-pay | Admitting: Internal Medicine

## 2022-10-14 DIAGNOSIS — F332 Major depressive disorder, recurrent severe without psychotic features: Secondary | ICD-10-CM

## 2022-10-14 MED ORDER — VILAZODONE HCL 20 MG PO TABS
1.0000 | ORAL_TABLET | Freq: Every day | ORAL | 0 refills | Status: DC
Start: 2022-10-14 — End: 2022-11-12

## 2022-10-19 ENCOUNTER — Encounter: Payer: Self-pay | Admitting: Internal Medicine

## 2022-10-23 ENCOUNTER — Encounter: Payer: Self-pay | Admitting: Internal Medicine

## 2022-10-30 ENCOUNTER — Encounter: Payer: Self-pay | Admitting: Internal Medicine

## 2022-11-05 ENCOUNTER — Other Ambulatory Visit: Payer: BC Managed Care – PPO

## 2022-11-06 ENCOUNTER — Other Ambulatory Visit: Payer: Self-pay

## 2022-11-06 ENCOUNTER — Encounter (HOSPITAL_COMMUNITY): Payer: Self-pay | Admitting: Internal Medicine

## 2022-11-06 ENCOUNTER — Observation Stay (HOSPITAL_COMMUNITY)
Admission: EM | Admit: 2022-11-06 | Discharge: 2022-11-07 | Disposition: A | Discharge status: Home / Self Care | Payer: BC Managed Care – PPO | Attending: Family Medicine | Admitting: Family Medicine

## 2022-11-06 ENCOUNTER — Emergency Department (HOSPITAL_COMMUNITY): Payer: BC Managed Care – PPO

## 2022-11-06 DIAGNOSIS — K219 Gastro-esophageal reflux disease without esophagitis: Secondary | ICD-10-CM | POA: Insufficient documentation

## 2022-11-06 DIAGNOSIS — E66813 Obesity, class 3: Secondary | ICD-10-CM | POA: Diagnosis present

## 2022-11-06 DIAGNOSIS — E785 Hyperlipidemia, unspecified: Secondary | ICD-10-CM | POA: Diagnosis present

## 2022-11-06 DIAGNOSIS — R0602 Shortness of breath: Secondary | ICD-10-CM | POA: Diagnosis not present

## 2022-11-06 DIAGNOSIS — J45901 Unspecified asthma with (acute) exacerbation: Secondary | ICD-10-CM | POA: Diagnosis not present

## 2022-11-06 DIAGNOSIS — Z1152 Encounter for screening for COVID-19: Secondary | ICD-10-CM | POA: Insufficient documentation

## 2022-11-06 DIAGNOSIS — K2100 Gastro-esophageal reflux disease with esophagitis, without bleeding: Secondary | ICD-10-CM | POA: Diagnosis present

## 2022-11-06 DIAGNOSIS — I160 Hypertensive urgency: Secondary | ICD-10-CM | POA: Diagnosis present

## 2022-11-06 DIAGNOSIS — J4541 Moderate persistent asthma with (acute) exacerbation: Principal | ICD-10-CM | POA: Insufficient documentation

## 2022-11-06 DIAGNOSIS — E663 Overweight: Secondary | ICD-10-CM | POA: Insufficient documentation

## 2022-11-06 DIAGNOSIS — E876 Hypokalemia: Secondary | ICD-10-CM | POA: Diagnosis present

## 2022-11-06 DIAGNOSIS — Z79899 Other long term (current) drug therapy: Secondary | ICD-10-CM | POA: Diagnosis not present

## 2022-11-06 DIAGNOSIS — F17210 Nicotine dependence, cigarettes, uncomplicated: Secondary | ICD-10-CM | POA: Insufficient documentation

## 2022-11-06 DIAGNOSIS — F32A Depression, unspecified: Secondary | ICD-10-CM | POA: Diagnosis present

## 2022-11-06 DIAGNOSIS — D500 Iron deficiency anemia secondary to blood loss (chronic): Secondary | ICD-10-CM | POA: Diagnosis present

## 2022-11-06 DIAGNOSIS — I10 Essential (primary) hypertension: Secondary | ICD-10-CM | POA: Diagnosis not present

## 2022-11-06 DIAGNOSIS — E119 Type 2 diabetes mellitus without complications: Secondary | ICD-10-CM

## 2022-11-06 DIAGNOSIS — F411 Generalized anxiety disorder: Secondary | ICD-10-CM | POA: Diagnosis present

## 2022-11-06 DIAGNOSIS — Z7982 Long term (current) use of aspirin: Secondary | ICD-10-CM | POA: Insufficient documentation

## 2022-11-06 LAB — BASIC METABOLIC PANEL
Anion gap: 10 (ref 5–15)
BUN: 7 mg/dL (ref 6–20)
CO2: 24 mmol/L (ref 22–32)
Calcium: 8.5 mg/dL — ABNORMAL LOW (ref 8.9–10.3)
Chloride: 102 mmol/L (ref 98–111)
Creatinine, Ser: 0.67 mg/dL (ref 0.44–1.00)
GFR, Estimated: >60 mL/min (ref >60)
Glucose, Bld: 149 mg/dL — ABNORMAL HIGH (ref 70–99)
Potassium: 3.1 mmol/L — ABNORMAL LOW (ref 3.5–5.1)
Sodium: 136 mmol/L (ref 135–145)

## 2022-11-06 LAB — CBC WITH DIFFERENTIAL/PLATELET
Abs Immature Granulocytes: 0.06 10*3/uL (ref 0.00–0.07)
Basophils Absolute: 0 10*3/uL (ref 0.0–0.1)
Basophils Relative: 0 %
Eosinophils Absolute: 0 10*3/uL (ref 0.0–0.5)
Eosinophils Relative: 0 %
HCT: 35.2 % — ABNORMAL LOW (ref 36.0–46.0)
Hemoglobin: 10.3 g/dL — ABNORMAL LOW (ref 12.0–15.0)
Immature Granulocytes: 0 %
Lymphocytes Relative: 16 %
Lymphs Abs: 2.4 10*3/uL (ref 0.7–4.0)
MCH: 22.1 pg — ABNORMAL LOW (ref 26.0–34.0)
MCHC: 29.3 g/dL — ABNORMAL LOW (ref 30.0–36.0)
MCV: 75.4 fL — ABNORMAL LOW (ref 80.0–100.0)
Monocytes Absolute: 0.9 10*3/uL (ref 0.1–1.0)
Monocytes Relative: 6 %
Neutro Abs: 11.3 10*3/uL — ABNORMAL HIGH (ref 1.7–7.7)
Neutrophils Relative %: 78 %
Platelets: 290 10*3/uL (ref 150–400)
RBC: 4.67 MIL/uL (ref 3.87–5.11)
RDW: 15.2 % (ref 11.5–15.5)
WBC: 14.7 10*3/uL — ABNORMAL HIGH (ref 4.0–10.5)
nRBC: 0 % (ref 0.0–0.2)

## 2022-11-06 LAB — D-DIMER, QUANTITATIVE: D-Dimer, Quant: 0.39 ug/mL-FEU (ref 0.00–0.50)

## 2022-11-06 LAB — SARS CORONAVIRUS 2 BY RT PCR: SARS Coronavirus 2 by RT PCR: NEGATIVE

## 2022-11-06 LAB — GLUCOSE, CAPILLARY
Glucose-Capillary: 247 mg/dL — ABNORMAL HIGH (ref 70–99)
Glucose-Capillary: 192 mg/dL — ABNORMAL HIGH (ref 70–99)

## 2022-11-06 LAB — CBG MONITORING, ED: Glucose-Capillary: 216 mg/dL — ABNORMAL HIGH (ref 70–99)

## 2022-11-06 MED ORDER — PANTOPRAZOLE SODIUM 40 MG PO TBEC: 40.0000 mg | DELAYED_RELEASE_TABLET | Freq: Every day | ORAL | Status: DC

## 2022-11-06 MED ORDER — HYDRALAZINE HCL 20 MG/ML IJ SOLN
10.0000 mg | INTRAMUSCULAR | Status: DC | PRN
  Administered 2022-11-06: 10 mg via INTRAVENOUS
  Filled 2022-11-06: qty 1

## 2022-11-06 MED ORDER — POTASSIUM CHLORIDE CRYS ER 20 MEQ PO TBCR
40.0000 meq | EXTENDED_RELEASE_TABLET | Freq: Once | ORAL | Status: AC
  Administered 2022-11-06: 40 meq via ORAL
  Filled 2022-11-06: qty 2

## 2022-11-06 MED ORDER — ACETAMINOPHEN 650 MG RE SUPP: 650.0000 mg | Freq: Four times a day (QID) | RECTAL | Status: DC | PRN

## 2022-11-06 MED ORDER — ONDANSETRON HCL 4 MG PO TABS: 4.0000 mg | ORAL_TABLET | Freq: Four times a day (QID) | ORAL | Status: DC | PRN

## 2022-11-06 MED ORDER — ALBUTEROL SULFATE (2.5 MG/3ML) 0.083% IN NEBU: 2.5000 mg | INHALATION_SOLUTION | RESPIRATORY_TRACT | Status: DC | PRN

## 2022-11-06 MED ORDER — CLONAZEPAM 1 MG PO TABS
1.0000 mg | ORAL_TABLET | Freq: Three times a day (TID) | ORAL | Status: DC | PRN
  Administered 2022-11-06 – 2022-11-07 (×2): 1 mg via ORAL
  Filled 2022-11-06 (×2): qty 1

## 2022-11-06 MED ORDER — ALBUTEROL SULFATE (2.5 MG/3ML) 0.083% IN NEBU
INHALATION_SOLUTION | RESPIRATORY_TRACT | Status: AC
  Administered 2022-11-06: 7.5 mg
  Filled 2022-11-06: qty 9

## 2022-11-06 MED ORDER — IPRATROPIUM-ALBUTEROL 0.5-2.5 (3) MG/3ML IN SOLN
3.0000 mL | Freq: Four times a day (QID) | RESPIRATORY_TRACT | Status: DC
  Administered 2022-11-06 – 2022-11-07 (×4): 3 mL via RESPIRATORY_TRACT
  Filled 2022-11-06 (×4): qty 3

## 2022-11-06 MED ORDER — ENOXAPARIN SODIUM 60 MG/0.6ML IJ SOSY
60.0000 mg | PREFILLED_SYRINGE | INTRAMUSCULAR | Status: DC
  Administered 2022-11-06: 60 mg via SUBCUTANEOUS
  Filled 2022-11-06: qty 0.6

## 2022-11-06 MED ORDER — NEBIVOLOL HCL 10 MG PO TABS
20.0000 mg | ORAL_TABLET | Freq: Every day | ORAL | Status: DC
  Administered 2022-11-06 – 2022-11-07 (×2): 20 mg via ORAL
  Filled 2022-11-06 (×2): qty 2

## 2022-11-06 MED ORDER — ONDANSETRON HCL 4 MG/2ML IJ SOLN: 4.0000 mg | Freq: Four times a day (QID) | INTRAMUSCULAR | Status: DC | PRN

## 2022-11-06 MED ORDER — IPRATROPIUM BROMIDE 0.02 % IN SOLN
0.5000 mg | Freq: Once | RESPIRATORY_TRACT | Status: AC
  Administered 2022-11-06: 0.5 mg via RESPIRATORY_TRACT
  Filled 2022-11-06: qty 2.5

## 2022-11-06 MED ORDER — TRAZODONE HCL 100 MG PO TABS
50.0000 mg | ORAL_TABLET | Freq: Every day | ORAL | Status: DC
  Administered 2022-11-06: 50 mg via ORAL
  Filled 2022-11-06: qty 1

## 2022-11-06 MED ORDER — HYDRALAZINE HCL 20 MG/ML IJ SOLN: 20.0000 mg | INTRAMUSCULAR | Status: DC | PRN

## 2022-11-06 MED ORDER — MAGNESIUM SULFATE 2 GM/50ML IV SOLN
2.0000 g | Freq: Once | INTRAVENOUS | Status: AC
  Administered 2022-11-06: 2 g via INTRAVENOUS
  Filled 2022-11-06: qty 50

## 2022-11-06 MED ORDER — LACTATED RINGERS IV SOLN: INTRAVENOUS | Status: DC

## 2022-11-06 MED ORDER — AMLODIPINE BESYLATE 5 MG PO TABS
10.0000 mg | ORAL_TABLET | Freq: Every day | ORAL | Status: DC
  Administered 2022-11-06 – 2022-11-07 (×2): 10 mg via ORAL
  Filled 2022-11-06 (×2): qty 2

## 2022-11-06 MED ORDER — HYDRALAZINE HCL 20 MG/ML IJ SOLN
5.0000 mg | Freq: Once | INTRAMUSCULAR | Status: DC
  Filled 2022-11-06: qty 1

## 2022-11-06 MED ORDER — HYDRALAZINE HCL 20 MG/ML IJ SOLN
20.0000 mg | Freq: Once | INTRAMUSCULAR | Status: AC
  Administered 2022-11-06: 20 mg via INTRAVENOUS

## 2022-11-06 MED ORDER — ROSUVASTATIN CALCIUM 20 MG PO TABS
20.0000 mg | ORAL_TABLET | Freq: Every day | ORAL | Status: DC
  Administered 2022-11-06: 20 mg via ORAL
  Filled 2022-11-06: qty 1

## 2022-11-06 MED ORDER — INSULIN ASPART 100 UNIT/ML IJ SOLN
0.0000 [IU] | Freq: Three times a day (TID) | INTRAMUSCULAR | Status: DC
  Administered 2022-11-06: 7 [IU] via SUBCUTANEOUS
  Administered 2022-11-07: 3 [IU] via SUBCUTANEOUS

## 2022-11-06 MED ORDER — VILAZODONE HCL 20 MG PO TABS
1.0000 | ORAL_TABLET | Freq: Every day | ORAL | Status: DC
  Administered 2022-11-07: 20 mg via ORAL
  Filled 2022-11-06: qty 1

## 2022-11-06 MED ORDER — METHYLPREDNISOLONE SODIUM SUCC 125 MG IJ SOLR
125.0000 mg | INTRAMUSCULAR | Status: AC
  Administered 2022-11-06: 125 mg via INTRAVENOUS
  Filled 2022-11-06: qty 2

## 2022-11-06 MED ORDER — ACETAMINOPHEN 325 MG PO TABS
650.0000 mg | ORAL_TABLET | Freq: Four times a day (QID) | ORAL | Status: DC | PRN
  Administered 2022-11-06: 650 mg via ORAL
  Filled 2022-11-06: qty 2

## 2022-11-06 MED ORDER — PREDNISONE 20 MG PO TABS
40.0000 mg | ORAL_TABLET | Freq: Every day | ORAL | Status: DC
  Administered 2022-11-07: 40 mg via ORAL
  Filled 2022-11-06: qty 2

## 2022-11-06 MED ORDER — ALBUTEROL SULFATE (2.5 MG/3ML) 0.083% IN NEBU
10.0000 mg/h | INHALATION_SOLUTION | Freq: Once | RESPIRATORY_TRACT | Status: AC
  Administered 2022-11-06: 2.5 mg/h via RESPIRATORY_TRACT
  Filled 2022-11-06: qty 3

## 2022-11-06 MED ORDER — TRIAMTERENE-HCTZ 37.5-25 MG PO TABS
1.0000 | ORAL_TABLET | Freq: Every day | ORAL | Status: DC
  Administered 2022-11-06 – 2022-11-07 (×2): 1 via ORAL
  Filled 2022-11-06 (×2): qty 1

## 2022-11-06 MED ORDER — CARIPRAZINE HCL 1.5 MG PO CAPS
1.5000 mg | ORAL_CAPSULE | Freq: Every day | ORAL | Status: DC
  Administered 2022-11-06 – 2022-11-07 (×2): 1.5 mg via ORAL
  Filled 2022-11-06 (×2): qty 1

## 2022-11-06 NOTE — ED Provider Notes (Addendum)
[place] EMERGENCY DEPARTMENT AT [facility] Provider Note   CSN: [id number] Arrival date & time: 11/06/22  4098     History  Chief Complaint  Patient presents with   Wheezing    Jenna Stephens is a 47 y.o. female.  47 year old female with a history of asthma presents with several days of shortness of breath with productive cough.  Patient states she felt she was getting a URI.  Denies any fever.  Cough has been nonproductive.  Using her home albuterol without relief.  History of prior hospitalization including ICU admission with intubation.  States that when she gets this bad normally has to be admitted.  Does have a primary care doctor as well as pulmonologist.       Home Medications Prior to Admission medications   Medication Sig Start Date End Date Taking? Authorizing Provider  albuterol (VENTOLIN HFA) 108 (90 Base) MCG/ACT inhaler Inhale 1-2 puffs into the lungs every 6 (six) hours as needed for wheezing or shortness of breath. 04/11/21   Tomi Bamberger, PA-C  aspirin EC 81 MG tablet Take 1 tablet (81 mg total) by mouth daily. Swallow whole. 10/03/22   Etta Grandchild, MD  cariprazine (VRAYLAR) 1.5 MG capsule Take 1 capsule (1.5 mg total) by mouth daily. 10/03/22   Etta Grandchild, MD  clonazePAM (KLONOPIN) 1 MG tablet Take 1 tablet (1 mg total) by mouth 3 (three) times daily. 10/03/22   Etta Grandchild, MD  Ferric Maltol (ACCRUFER) 30 MG CAPS Take 1 capsule (30 mg total) by mouth in the morning and at bedtime. 10/03/22   Etta Grandchild, MD  Fluticasone-Umeclidin-Vilant (TRELEGY ELLIPTA) 100-62.5-25 MCG/ACT AEPB Inhale 1 puff into the lungs daily. 09/13/21   Etta Grandchild, MD  ipratropium-albuterol (DUONEB) 0.5-2.5 (3) MG/3ML SOLN Take 3 mLs by nebulization every 4 (four) hours as needed. 02/06/19   Glenford Bayley, NP  Multiple Vitamin (MULTIVITAMIN) tablet Take by mouth daily.    [provider]  Nebivolol HCl 20 MG TABS Take 1 tablet (20 mg total) by  mouth daily. 09/29/22   Bing Neighbors, NP  omeprazole (PRILOSEC) 40 MG capsule Take 1 capsule (40 mg total) by mouth daily. 06/29/21   Etta Grandchild, MD  rosuvastatin (CRESTOR) 20 MG tablet Take 1 tablet (20 mg total) by mouth daily. 10/12/22   Etta Grandchild, MD  traZODone (DESYREL) 50 MG tablet Take 1 tablet (50 mg total) by mouth at bedtime. 10/12/22   Etta Grandchild, MD  triamterene-hydrochlorothiazide (DYAZIDE) 37.5-25 MG capsule Take 1 each (1 capsule total) by mouth daily. 10/03/22   Etta Grandchild, MD  Vilazodone HCl 20 MG TABS Take 1 tablet (20 mg total) by mouth daily. 10/14/22   Etta Grandchild, MD      Allergies    Patient has no known allergies.    Review of Systems   Review of Systems  All other systems reviewed and are negative.   Physical Exam Updated Vital Signs BP (!) 237/125   Pulse (!) 101   Temp 98 F (36.7 C) (Oral)   Resp 20   Ht 1.727 m (5\' 8" )   Wt 127.9 kg   SpO2 100%   BMI 42.88 kg/m  Physical Exam Vitals and nursing note reviewed.  Constitutional:      General: She is not in acute distress.    Appearance: Normal appearance. She is well-developed. She is not toxic-appearing.  HENT:     Head:  Normocephalic and atraumatic.  Eyes:     General: Lids are normal.     Conjunctiva/sclera: Conjunctivae normal.     Pupils: Pupils are equal, round, and reactive to light.  Neck:     Thyroid: No thyroid mass.     Trachea: No tracheal deviation.  Cardiovascular:     Rate and Rhythm: Normal rate and regular rhythm.     Heart sounds: Normal heart sounds. No murmur heard.    No gallop.  Pulmonary:     Effort: Tachypnea and respiratory distress present.     Breath sounds: No stridor. Examination of the right-upper field reveals decreased breath sounds and wheezing. Examination of the left-upper field reveals decreased breath sounds and wheezing. Decreased breath sounds and wheezing present. No rhonchi or rales.  Abdominal:     General: There is no  distension.     Palpations: Abdomen is soft.     Tenderness: There is no abdominal tenderness. There is no rebound.  Musculoskeletal:        General: No tenderness. Normal range of motion.     Cervical back: Normal range of motion and neck supple.  Skin:    General: Skin is warm and dry.     Findings: No abrasion or rash.  Neurological:     Mental Status: She is alert and oriented to person, place, and time. Mental status is at baseline.     GCS: GCS eye subscore is 4. GCS verbal subscore is 5. GCS motor subscore is 6.     Cranial Nerves: Cranial nerves are intact. No cranial nerve deficit.     Sensory: No sensory deficit.     Motor: Motor function is intact.  Psychiatric:        Attention and Perception: Attention normal.        Speech: Speech normal.        Behavior: Behavior normal.     ED Results / Procedures / Treatments   Labs (all labs ordered are listed, but only abnormal results are displayed) Labs Reviewed  SARS CORONAVIRUS 2 BY RT PCR  CBC WITH DIFFERENTIAL/PLATELET  BASIC METABOLIC PANEL    EKG EKG Interpretation Date/Time:  Tuesday November 06 2022 07:41:01 EDT Ventricular Rate:  93 PR Interval:  141 QRS Duration:  93 QT Interval:  378 QTC Calculation: 471 R Axis:   21  Text Interpretation: Sinus rhythm Probable left atrial enlargement Low voltage, precordial leads Confirmed by Lorre Nick ([id number]) on 11/06/2022 7:56:14 AM  Radiology No results found.  Procedures Procedures    Medications Ordered in ED Medications  lactated ringers infusion (has no administration in time range)  albuterol (PROVENTIL,VENTOLIN) solution continuous neb (has no administration in time range)  magnesium sulfate IVPB 2 g 50 mL (has no administration in time range)  methylPREDNISolone sodium succinate (SOLU-MEDROL) 125 mg/2 mL injection 125 mg (has no administration in time range)  ipratropium (ATROVENT) nebulizer solution 0.5 mg (has no administration in time range)    ED  Course/ Medical Decision Making/ A&P                             Medical Decision Making Amount and/or Complexity of Data Reviewed Labs: ordered. Radiology: ordered.  Risk Prescription drug management.   Patient presented with wheezing consistent with her prior asthma exacerbations.  Was medicated with magnesium, albuterol, prednisone, Atrovent.  Reassessed and remains still short of breath.  Chest x-ray per interpretation showed no acute  findings.  Labs are reassuring here with exception of less likely leukocytosis.  Patient also has mild hypokalemia and will be given oral potassium here.  Considered possible infection and COVID test negative.  Patient mildly hypertensive patient will be given 5 mg of hydralazine.  Suspect this is an exacerbation of her asthma.  Will add a D-dimer although I do have a low suspicion for PE.  Patient will require hospitalization.  Will consult hospitalist team  CRITICAL CARE Performed by: Toy Baker Total critical care time: 50 minutes Critical care time was exclusive of separately billable procedures and treating other patients. Critical care was necessary to treat or prevent imminent or life-threatening deterioration. Critical care was time spent personally by me on the following activities: development of treatment plan with patient and/or surrogate as well as nursing, discussions with consultants, evaluation of patient's response to treatment, examination of patient, obtaining history from patient or surrogate, ordering and performing treatments and interventions, ordering and review of laboratory studies, ordering and review of radiographic studies, pulse oximetry and re-evaluation of patient's condition.         Final Clinical Impression(s) / ED Diagnoses Final diagnoses:  None    Rx / DC Orders ED Discharge Orders     None         Lorre Nick, MD 11/06/22 1026    Lorre Nick, MD 11/06/22 1027

## 2022-11-06 NOTE — H&P (Signed)
History and Physical    Patient: Jenna Stephens ZDG:[id number] DOB: 29-Aug-1975 DOA: 11/06/2022 DOS: the patient was seen and examined on 11/06/2022 PCP: Etta Grandchild, MD  Patient coming from: Home  Chief Complaint:  Chief Complaint  Patient presents with   Wheezing   HPI: Jenna Stephens is a 47 y.o. female with medical history significant of asthma, history of acute respiratory failure, intubated once, gestational diabetes, hypertension, iron deficiency anemia, class III obesity who is coming to the emergency department with complaints of progressively worse dyspnea associated with wheezing, nonproductive cough, rhinorrhea and fatigue.  She also had a frontal headache recently. He denied fever, chills,  sore throat or hemoptysis.  No chest pain, palpitations, diaphoresis, PND, orthopnea or recent pitting edema of the lower extremities.  No abdominal pain, nausea, emesis, diarrhea, melena or hematochezia.  She was constipated 2 days ago, but this has resolved since then.  No flank pain, dysuria, frequency or hematuria.  No polyuria, polydipsia, polyphagia or blurred vision.   Lab work: CBC 0 white count 14.7 with 78% neutrophils, hemoglobin 10.3 g/dL with an MCV of 95.1 fL and platelets 290.  Coronavirus PCR was negative.  D-dimer was normal.  BMP showed a potassium of 3.1 mmol/L, glucose 149 and calcium 8.5 mg/dL.  Renal function and the rest of the electrolytes were normal.  Imaging: Portable 1 view chest radiograph with no active disease.  ED course: Initial vital signs were temperature 98 F, pulse 71, respiration 20, BP 237/125 mmHg O2 sat 100% on room air.  The patient received a continuous 10 mg albuterol neb, a DuoNeb, K-Lor 40 mEq p.o. x 1, magnesium sulfate 2 g IVPB and methylprednisolone 125 mg p.o.  She also was ordered hydralazine 5 mg IVP, but I increased it to 20 mg IVP x 1.   Review of Systems: As mentioned in the history of present illness. All other systems reviewed and are  negative. Past Medical History:  Diagnosis Date   Acute respiratory failure (HCC) 12/29/2017   Anxiety    Asthma    Gestational diabetes    Hypertension    Iron deficiency anemia    Obesity    Past Surgical History:  Procedure Laterality Date   TONSILLECTOMY     TUBAL LIGATION N/A 10/07/2018   Procedure: POST PARTUM TUBAL LIGATION;  Surgeon: Levie Heritage, DO;  Location: [facility] LD ORS;  Service: Gynecology;  Laterality: N/A;   Social History:  reports that she has been smoking cigarettes. She has been exposed to tobacco smoke. She has never used smokeless tobacco. She reports current alcohol use of about 2.0 standard drinks of alcohol per week. She reports that she does not currently use drugs after having used the following drugs: Marijuana.  No Known Allergies  Family History  Problem Relation Age of Onset   Diabetes Mother    Hypertension Mother    Hypertension Father    COPD Father     Prior to Admission medications   Medication Sig Start Date End Date Taking? Authorizing Provider  albuterol (VENTOLIN HFA) 108 (90 Base) MCG/ACT inhaler Inhale 1-2 puffs into the lungs every 6 (six) hours as needed for wheezing or shortness of breath. 04/11/21   Tomi Bamberger, PA-C  aspirin EC 81 MG tablet Take 1 tablet (81 mg total) by mouth daily. Swallow whole. 10/03/22   Etta Grandchild, MD  cariprazine (VRAYLAR) 1.5 MG capsule Take 1 capsule (1.5 mg total) by mouth daily. 10/03/22   Yetta Barre,  Bernadene Bell, MD  clonazePAM (KLONOPIN) 1 MG tablet Take 1 tablet (1 mg total) by mouth 3 (three) times daily. 10/03/22   Etta Grandchild, MD  Ferric Maltol (ACCRUFER) 30 MG CAPS Take 1 capsule (30 mg total) by mouth in the morning and at bedtime. 10/03/22   Etta Grandchild, MD  Fluticasone-Umeclidin-Vilant (TRELEGY ELLIPTA) 100-62.5-25 MCG/ACT AEPB Inhale 1 puff into the lungs daily. 09/13/21   Etta Grandchild, MD  ipratropium-albuterol (DUONEB) 0.5-2.5 (3) MG/3ML SOLN Take 3 mLs by nebulization every 4 (four)  hours as needed. 02/06/19   Glenford Bayley, NP  Multiple Vitamin (MULTIVITAMIN) tablet Take by mouth daily.    [provider]  Nebivolol HCl 20 MG TABS Take 1 tablet (20 mg total) by mouth daily. 09/29/22   Bing Neighbors, NP  omeprazole (PRILOSEC) 40 MG capsule Take 1 capsule (40 mg total) by mouth daily. 06/29/21   Etta Grandchild, MD  rosuvastatin (CRESTOR) 20 MG tablet Take 1 tablet (20 mg total) by mouth daily. 10/12/22   Etta Grandchild, MD  traZODone (DESYREL) 50 MG tablet Take 1 tablet (50 mg total) by mouth at bedtime. 10/12/22   Etta Grandchild, MD  triamterene-hydrochlorothiazide (DYAZIDE) 37.5-25 MG capsule Take 1 each (1 capsule total) by mouth daily. 10/03/22   Etta Grandchild, MD  Vilazodone HCl 20 MG TABS Take 1 tablet (20 mg total) by mouth daily. 10/14/22   Etta Grandchild, MD    Physical Exam: Vitals:   11/06/22 0738 11/06/22 0739 11/06/22 1015  BP:  (!) 237/125 (!) 213/106  Pulse: (!) 101  100  Resp: 20  17  Temp: 98 F (36.7 C)    TempSrc: Oral    SpO2: 100%  100%  Weight: 127.9 kg    Height: 5\' 8"  (1.727 m)     Physical Exam Vitals and nursing note reviewed.  Constitutional:      General: She is awake. She is not in acute distress.    Appearance: Normal appearance. She is morbidly obese.     Interventions: Nasal cannula in place.  HENT:     Head: Normocephalic.     Nose: No rhinorrhea.     Mouth/Throat:     Mouth: Mucous membranes are moist.  Eyes:     General: No scleral icterus.    Pupils: Pupils are equal, round, and reactive to light.  Cardiovascular:     Rate and Rhythm: Normal rate and regular rhythm.     Heart sounds: S1 normal and S2 normal.  Pulmonary:     Effort: Pulmonary effort is normal. Tachypnea present. No accessory muscle usage.     Breath sounds: Decreased breath sounds, wheezing and rhonchi present. No rales.  Abdominal:     General: Bowel sounds are normal. There is no distension.     Palpations: Abdomen is soft.      Tenderness: There is no abdominal tenderness.  Musculoskeletal:     Cervical back: Neck supple.     Right lower leg: No edema.     Left lower leg: No edema.  Skin:    General: Skin is warm and dry.  Neurological:     General: No focal deficit present.     Mental Status: She is alert and oriented to person, place, and time.  Psychiatric:        Mood and Affect: Mood normal.        Behavior: Behavior normal. Behavior is cooperative.    Data Reviewed:  Results are pending, will review when available.  Assessment and Plan: Principal Problem:   Asthma exacerbation Observation/telemetry Continue supplemental oxygen. Methylprednisolone 125 mg IVP x1. Followed by prednisone 40 mg p.o. daily in a.m. Scheduled and as needed bronchodilators. Patient to avoid occasional smoking. Does not need a nicotine patch today. Follow-up CBC and chemistry in the morning.   Active Problems:   Hypertensive urgency Has not taken BP meds today. Continue nebivolol 20 mg p.o. daily. Continue Dyazide 37.5/25 mg p.o. daily. Hydralazine 10 mg IVP as needed.    Hypokalemia Replenishing. Received magnesium supplementation earlier. Follow-up potassium level in AM.    Type 2 diabetes mellitus without complication,  without long-term current use of insulin (HCC) Last hemoglobin A1c at 6.0% last month. Carbohydrate modified diet CBG monitoring with [facility] SS while in the hospital.    Hyperlipidemia LDL goal <100 Continue rosuvastatin 20 mg p.o. daily.    GAD (generalized anxiety disorder)   Depression  Continue vilazodone on 10 mg p.o. daily. Continue cariprazine 1.5 mg p.o. daily. Continue trazodone 50 mg p.o. at bedtime.    Iron deficiency anemia due to chronic blood loss Monitor hematocrit and hemoglobin.    Gastroesophageal reflux disease with esophagitis without hemorrhage Continue omeprazole 40 mg or formulary equivalent.    Class 3 obesity (HCC) Current BMI 42.88 kg/m. Lifestyle  modifications.    Hypocalcemia Repeat calcium with albumin level in AM.      Advance Care Planning:   Code Status: Full Code   Consults:   Family Communication: Her mother was at bedside.  Severity of Illness: The appropriate patient status for this patient is OBSERVATION. Observation status is judged to be reasonable and necessary in order to provide the required intensity of service to ensure the patient's safety. The patient's presenting symptoms, physical exam findings, and initial radiographic and laboratory data in the context of their medical condition is felt to place them at decreased risk for further clinical deterioration. Furthermore, it is anticipated that the patient will be medically stable for discharge from the hospital within 2 midnights of admission.   Author: Bobette Mo, MD 11/06/2022 10:40 AM  For on call review [email or web address].   This document was prepared using Dragon voice recognition software and may contain some unintended transcription errors.

## 2022-11-06 NOTE — ED Triage Notes (Signed)
Pt reports wheezing, chest tightness and "trouble breathing" x 2 days. Hx Asthma

## 2022-11-07 DIAGNOSIS — J45901 Unspecified asthma with (acute) exacerbation: Secondary | ICD-10-CM | POA: Diagnosis not present

## 2022-11-07 DIAGNOSIS — K2100 Gastro-esophageal reflux disease with esophagitis, without bleeding: Secondary | ICD-10-CM

## 2022-11-07 DIAGNOSIS — I160 Hypertensive urgency: Secondary | ICD-10-CM

## 2022-11-07 DIAGNOSIS — J4541 Moderate persistent asthma with (acute) exacerbation: Secondary | ICD-10-CM | POA: Diagnosis not present

## 2022-11-07 DIAGNOSIS — F411 Generalized anxiety disorder: Secondary | ICD-10-CM

## 2022-11-07 LAB — COMPREHENSIVE METABOLIC PANEL
ALT: 19 U/L (ref 0–44)
AST: 13 U/L — ABNORMAL LOW (ref 15–41)
Albumin: 3.9 g/dL (ref 3.5–5.0)
Alkaline Phosphatase: 53 U/L (ref 38–126)
Anion gap: 10 (ref 5–15)
BUN: 10 mg/dL (ref 6–20)
CO2: 22 mmol/L (ref 22–32)
Calcium: 9.2 mg/dL (ref 8.9–10.3)
Chloride: 101 mmol/L (ref 98–111)
Creatinine, Ser: 0.7 mg/dL (ref 0.44–1.00)
GFR, Estimated: >60 mL/min (ref >60)
Glucose, Bld: 137 mg/dL — ABNORMAL HIGH (ref 70–99)
Potassium: 3.8 mmol/L (ref 3.5–5.1)
Sodium: 133 mmol/L — ABNORMAL LOW (ref 135–145)
Total Bilirubin: 0.8 mg/dL (ref 0.3–1.2)
Total Protein: 7.4 g/dL (ref 6.5–8.1)

## 2022-11-07 LAB — CBC
HCT: 35.6 % — ABNORMAL LOW (ref 36.0–46.0)
Hemoglobin: 10.8 g/dL — ABNORMAL LOW (ref 12.0–15.0)
MCH: 22.6 pg — ABNORMAL LOW (ref 26.0–34.0)
MCHC: 30.3 g/dL (ref 30.0–36.0)
MCV: 74.6 fL — ABNORMAL LOW (ref 80.0–100.0)
Platelets: 337 10*3/uL (ref 150–400)
RBC: 4.77 MIL/uL (ref 3.87–5.11)
RDW: 15.3 % (ref 11.5–15.5)
WBC: 23.7 10*3/uL — ABNORMAL HIGH (ref 4.0–10.5)
nRBC: 0.1 % (ref 0.0–0.2)

## 2022-11-07 LAB — GLUCOSE, CAPILLARY
Glucose-Capillary: 137 mg/dL — ABNORMAL HIGH (ref 70–99)
Glucose-Capillary: 176 mg/dL — ABNORMAL HIGH (ref 70–99)

## 2022-11-07 LAB — HIV ANTIBODY (ROUTINE TESTING W REFLEX): HIV Screen 4th Generation wRfx: NONREACTIVE

## 2022-11-07 MED ORDER — PREDNISONE 10 MG PO TABS: ORAL_TABLET | ORAL | 0 refills | Status: DC

## 2022-11-07 MED ORDER — IPRATROPIUM-ALBUTEROL 0.5-2.5 (3) MG/3ML IN SOLN: 3.0000 mL | Freq: Two times a day (BID) | RESPIRATORY_TRACT | Status: DC

## 2022-11-07 MED ORDER — DOXYCYCLINE HYCLATE 100 MG PO TABS: 100.0000 mg | ORAL_TABLET | Freq: Two times a day (BID) | ORAL | 0 refills | Status: DC

## 2022-11-07 MED ORDER — AMLODIPINE BESYLATE 10 MG PO TABS: 10.0000 mg | ORAL_TABLET | Freq: Every day | ORAL | 2 refills | Status: DC

## 2022-11-07 NOTE — TOC CM/SW Note (Signed)
Transition of Care [facility]) - Inpatient Brief Assessment   Patient Details  Name: QUINCEY NORED MRN: [id number] Date of Birth: 1975/05/19  [facility]) CM/SW Contact:    Howell Rucks, RN Phone Number: 11/07/2022, 10:34 AM   Clinical Narrative: Met with pt at bedside to introduce role of TOC/NCM and review for dc planning. Pt confirms she has a PCP and pharmacy in place, no home DME except Nebulizer, no home care services, pt confirms she has transportation available at discharge. TOC Brief Assessment completed. No TOC needs identified.     Transition of Care Asessment: Insurance and Status: Insurance coverage has been reviewed Patient has primary care physician: Yes Home environment has been reviewed: private residence with spouse Prior level of function:: Independent Prior/Current Home Services: No current home services Social Determinants of Health Reivew: SDOH reviewed no interventions necessary Readmission risk has been reviewed: Yes Transition of care needs: no transition of care needs at this time

## 2022-11-07 NOTE — Discharge Summary (Signed)
Physician Discharge Summary   Patient: Jenna Stephens MRN: [id number] DOB: 1975-07-15  Admit date:     11/06/2022  Discharge date: 11/07/22  Discharge Physician: Meredeth Ide   PCP: Etta Grandchild, MD   Recommendations at discharge:   Follow-up PCP in 1 week  Discharge Diagnoses: Principal Problem:   Asthma exacerbation Active Problems:   Hypokalemia   Hypertensive urgency   Type 2 diabetes mellitus without complication, without long-term current use of insulin (HCC)   Hyperlipidemia LDL goal <100   GAD (generalized anxiety disorder)   Iron deficiency anemia due to chronic blood loss   Gastroesophageal reflux disease with esophagitis without hemorrhage   Class 3 obesity (HCC)   Hypocalcemia   Depression  Resolved Problems:   * No resolved hospital problems. [facility]: a 47 y.o. female with medical history significant of asthma, history of acute respiratory failure, intubated once, gestational diabetes, hypertension, iron deficiency anemia, class III obesity who is coming to the emergency department with complaints of progressively worse dyspnea associated with wheezing, nonproductive cough, rhinorrhea and fatigue.  She also had a frontal headache recently. He denied fever, chills,  sore throat or hemoptysis.  No chest pain, palpitations, diaphoresis, PND, orthopnea or recent pitting edema of the lower extremities.  No abdominal pain, nausea, emesis, diarrhea, melena or hematochezia.  She was constipated 2 days ago, but this has resolved since then.  No flank pain, dysuria, frequency or hematuria.  No polyuria, polydipsia, polyphagia or blurred vision.    Lab work: CBC 0 white count 14.7 with 78% neutrophils, hemoglobin 10.3 g/dL with an MCV of 95.6 fL and platelets 290.  Coronavirus PCR was negative.  D-dimer was normal.  BMP showed a potassium of 3.1 mmol/L, glucose 149 and calcium 8.5 mg/dL.  Renal function and the rest of the electrolytes were normal.   Assessment and  Plan:  Asthma exacerbation -Resolved -Will discharge on prednisone taper, doxycycline for 5 days -Continue albuterol as needed -Not requiring oxygen, 96% O2 sats on ambulation  Hypertensive urgency -Patient did not take her medications -Blood pressure improved after restarting home medications and adding amlodipine 10 mg daily  Diabetes mellitus type 2 -Does not use insulin  Hyperlipidemia -Continue rosuvastatin  Generalized anxiety disorder -Continue home medications  GERD -Continue PPI  Class 3 obesity (HCC) Current BMI 42.88 kg/m. Lifestyle modifications.   Hypocalcemia -Resolved       Consultants:  Procedures performed:  Disposition: Home Diet recommendation:  Discharge Diet Orders (From admission, onward)     Start     Ordered   11/07/22 0000  Diet - low sodium heart healthy        11/07/22 1446           Regular diet DISCHARGE MEDICATION: Allergies as of 11/07/2022   No Known Allergies      Medication List     TAKE these medications    albuterol 108 (90 Base) MCG/ACT inhaler Commonly known as: VENTOLIN HFA Inhale 1-2 puffs into the lungs every 6 (six) hours as needed for wheezing or shortness of breath. What changed:  how much to take when to take this   amLODipine 10 MG tablet Commonly known as: NORVASC Take 1 tablet (10 mg total) by mouth daily. Start taking on: November 08, 2022   aspirin EC 81 MG tablet Take 1 tablet (81 mg total) by mouth daily. Swallow whole.   cariprazine 1.5 MG capsule Commonly known as: Vraylar Take 1 capsule (1.5 mg  total) by mouth daily.   clonazePAM 1 MG tablet Commonly known as: KLONOPIN Take 1 tablet (1 mg total) by mouth 3 (three) times daily.   doxycycline 100 MG tablet Commonly known as: VIBRA-TABS Take 1 tablet (100 mg total) by mouth 2 (two) times daily.   ipratropium-albuterol 0.5-2.5 (3) MG/3ML Soln Commonly known as: DUONEB Take 3 mLs by nebulization every 4 (four) hours as  needed. What changed:  when to take this reasons to take this   naproxen sodium 220 MG tablet Commonly known as: ALEVE Take 880 mg by mouth as needed (headache).   Nebivolol HCl 20 MG Tabs Take 1 tablet (20 mg total) by mouth daily.   omeprazole 40 MG capsule Commonly known as: PRILOSEC Take 1 capsule (40 mg total) by mouth daily.   predniSONE 10 MG tablet Commonly known as: DELTASONE Prednisone 40 mg po daily x 1 day then Prednisone 30 mg po daily x 1 day then Prednisone 20 mg po daily x 1 day then Prednisone 10 mg daily x 1 day then stop...   rosuvastatin 20 MG tablet Commonly known as: Crestor Take 1 tablet (20 mg total) by mouth daily.   traZODone 50 MG tablet Commonly known as: DESYREL Take 1 tablet (50 mg total) by mouth at bedtime. What changed: how much to take   triamterene-hydrochlorothiazide 37.5-25 MG capsule Commonly known as: Dyazide Take 1 each (1 capsule total) by mouth daily.   Vilazodone HCl 20 MG Tabs Take 1 tablet (20 mg total) by mouth daily.        Discharge Exam: Filed Weights   11/06/22 0738  Weight: 127.9 kg   General-appears in no acute distress Heart-S1-S2, regular, no murmur auscultated Lungs-clear to auscultation bilaterally, no wheezing or crackles auscultated Abdomen-soft, nontender, no organomegaly Extremities-no edema in the lower extremities Neuro-alert, oriented x3, no focal deficit noted  Condition at discharge: good  The results of significant diagnostics from this hospitalization (including imaging, microbiology, ancillary and laboratory) are listed below for reference.   Imaging Studies: DG Chest Port 1 View  Result Date: 11/06/2022 CLINICAL DATA:  Shortness of breath. EXAM: PORTABLE CHEST 1 VIEW COMPARISON:  October 15, 2019. FINDINGS: The heart size and mediastinal contours are within normal limits. Both lungs are clear. The visualized skeletal structures are unremarkable. IMPRESSION: No active disease. Electronically  Signed   By: Lupita Raider M.D.   On: 11/06/2022 08:52   US Renal  Result Date: 10/11/2022 CLINICAL DATA:  Hypertension. EXAM: RENAL / URINARY TRACT ULTRASOUND COMPLETE COMPARISON:  None Available. FINDINGS: Right Kidney: Renal measurements: 12.6 x 5.8 x 6.6 cm = volume: 250.7 mL. Echogenicity within normal limits. No mass or hydronephrosis visualized. Left Kidney: Renal measurements: 12.9 x 5.5 x 6.8 cm = volume: 253.9 mL. Echogenicity within normal limits. No mass or hydronephrosis visualized. Bladder: Appears normal for degree of bladder distention. Other: Prevoid: 73.9 cc IMPRESSION: No hydronephrosis. Electronically Signed   By: Annia Belt M.D.   On: 10/11/2022 11:34    Microbiology: Results for orders placed or performed during the hospital encounter of 11/06/22  SARS Coronavirus 2 by RT PCR (hospital order, performed in [facility] hospital lab) *cepheid single result test* Anterior Nasal Swab     Status: None   Collection Time: 11/06/22  8:10 AM   Specimen: Anterior Nasal Swab  Result Value Ref Range Status   SARS Coronavirus 2 by RT PCR NEGATIVE NEGATIVE Final    Comment: (NOTE) SARS-CoV-2 target nucleic acids are NOT DETECTED.  The SARS-CoV-2 RNA is generally detectable in upper and lower respiratory specimens during the acute phase of infection. The lowest concentration of SARS-CoV-2 viral copies this assay can detect is 250 copies / mL. A negative result does not preclude SARS-CoV-2 infection and should not be used as the sole basis for treatment or other patient management decisions.  A negative result may occur with improper specimen collection / handling, submission of specimen other than nasopharyngeal swab, presence of viral mutation(s) within the areas targeted by this assay, and inadequate number of viral copies (<250 copies / mL). A negative result must be combined with clinical observations, patient history, and epidemiological information.  Fact Sheet for  Patients:   RoadLapTop.co.za  Fact Sheet for Healthcare Providers: http://kim-miller.com/  This test is not yet approved or  cleared by the [place] FDA and has been authorized for detection and/or diagnosis of SARS-CoV-2 by FDA under an Emergency Use Authorization (EUA).  This EUA will remain in effect (meaning this test can be used) for the duration of the COVID-19 declaration under Section 564(b)(1) of the Act, 21 U.S.C. section 360bbb-3(b)(1), unless the authorization is terminated or revoked sooner.  Performed at [facility], [address], [place], [place] [address]     Labs: CBC: Recent Labs  Lab 11/06/22 0810 11/07/22 0448  WBC 14.7* 23.7*  NEUTROABS 11.3*  --   HGB 10.3* 10.8*  HCT 35.2* 35.6*  MCV 75.4* 74.6*  PLT 290 337   Basic Metabolic Panel: Recent Labs  Lab 11/06/22 0810 11/07/22 0448  NA 136 133*  K 3.1* 3.8  CL 102 101  CO2 24 22  GLUCOSE 149* 137*  BUN 7 10  CREATININE 0.67 0.70  CALCIUM 8.5* 9.2   Liver Function Tests: Recent Labs  Lab 11/07/22 0448  AST 13*  ALT 19  ALKPHOS 53  BILITOT 0.8  PROT 7.4  ALBUMIN 3.9   CBG: Recent Labs  Lab 11/06/22 1143 11/06/22 1720 11/06/22 2120 11/07/22 0743 11/07/22 1205  GLUCAP 216* 247* 192* 137* 176*    Discharge time spent: greater than 30 minutes.  Signed: Meredeth Ide, MD [facility] 11/07/2022

## 2022-11-07 NOTE — Final Progress Note (Signed)
Patient discharge instructions explained to patient. Medication regimen explained for prednisone taper. Patient confirmed competence for taper. Pharmacy verified with patient. Patient pharmacy verified. Patient's PIV removed. Patient safely escorted by CNA to main entrance for discharge.

## 2022-11-07 NOTE — Plan of Care (Signed)
  Problem: Education: Goal: Knowledge of disease or condition will improve Outcome: Progressing   Problem: Activity: Goal: Ability to tolerate increased activity will improve Outcome: Progressing   Problem: Respiratory: Goal: Ability to maintain a clear airway will improve Outcome: Progressing   Problem: Coping: Goal: Ability to adjust to condition or change in health will improve Outcome: Progressing   Problem: Skin Integrity: Goal: Risk for impaired skin integrity will decrease Outcome: Progressing

## 2022-11-07 NOTE — Progress Notes (Signed)
SATURATION QUALIFICATIONS: (This note is used to comply with regulatory documentation for home oxygen)  Patient Saturations on Room Air at Rest = 96%  Patient Saturations on Room Air while Ambulating = 96%  Patient Saturations on 0 Liters of oxygen while Ambulating = 96%  Please briefly explain why patient needs home oxygen: Patient does not qualify for oxygen

## 2022-11-12 ENCOUNTER — Other Ambulatory Visit: Payer: Self-pay | Admitting: Internal Medicine

## 2022-11-12 ENCOUNTER — Encounter: Payer: Self-pay | Admitting: Internal Medicine

## 2022-11-12 DIAGNOSIS — F332 Major depressive disorder, recurrent severe without psychotic features: Secondary | ICD-10-CM

## 2022-11-13 ENCOUNTER — Encounter: Payer: Self-pay | Admitting: Internal Medicine

## 2022-11-13 ENCOUNTER — Ambulatory Visit: Payer: BC Managed Care – PPO | Admitting: Internal Medicine

## 2022-11-13 VITALS — BP 140/90 | HR 84 | Temp 98.1°F | Resp 16 | Ht 68.0 in | Wt 275.0 lb

## 2022-11-13 DIAGNOSIS — E119 Type 2 diabetes mellitus without complications: Secondary | ICD-10-CM

## 2022-11-13 DIAGNOSIS — J4551 Severe persistent asthma with (acute) exacerbation: Secondary | ICD-10-CM | POA: Diagnosis not present

## 2022-11-13 DIAGNOSIS — T502X5A Adverse effect of carbonic-anhydrase inhibitors, benzothiadiazides and other diuretics, initial encounter: Secondary | ICD-10-CM

## 2022-11-13 DIAGNOSIS — D500 Iron deficiency anemia secondary to blood loss (chronic): Secondary | ICD-10-CM | POA: Diagnosis not present

## 2022-11-13 DIAGNOSIS — E876 Hypokalemia: Secondary | ICD-10-CM

## 2022-11-13 DIAGNOSIS — I10 Essential (primary) hypertension: Secondary | ICD-10-CM

## 2022-11-13 DIAGNOSIS — Z1159 Encounter for screening for other viral diseases: Secondary | ICD-10-CM

## 2022-11-13 LAB — BASIC METABOLIC PANEL WITH GFR
BUN: 14 mg/dL (ref 6–23)
CO2: 31 meq/L (ref 19–32)
Calcium: 9 mg/dL (ref 8.4–10.5)
Chloride: 101 meq/L (ref 96–112)
Creatinine, Ser: 0.77 mg/dL (ref 0.40–1.20)
GFR: 93.48 mL/min
Glucose, Bld: 113 mg/dL — ABNORMAL HIGH (ref 70–99)
Potassium: 3.1 meq/L — ABNORMAL LOW (ref 3.5–5.1)
Sodium: 138 meq/L (ref 135–145)

## 2022-11-13 LAB — CBC WITH DIFFERENTIAL/PLATELET
Basophils Absolute: 0 10*3/uL (ref 0.0–0.1)
Basophils Relative: 0.3 % (ref 0.0–3.0)
Eosinophils Absolute: 0.1 10*3/uL (ref 0.0–0.7)
Eosinophils Relative: 1.1 % (ref 0.0–5.0)
HCT: 35.5 % — ABNORMAL LOW (ref 36.0–46.0)
Hemoglobin: 10.4 g/dL — ABNORMAL LOW (ref 12.0–15.0)
Lymphocytes Relative: 33.6 % (ref 12.0–46.0)
Lymphs Abs: 3.9 10*3/uL (ref 0.7–4.0)
MCHC: 29.4 g/dL — ABNORMAL LOW (ref 30.0–36.0)
MCV: 74.1 fl — ABNORMAL LOW (ref 78.0–100.0)
Monocytes Absolute: 0.9 10*3/uL (ref 0.1–1.0)
Monocytes Relative: 7.6 % (ref 3.0–12.0)
Neutro Abs: 6.7 10*3/uL (ref 1.4–7.7)
Neutrophils Relative %: 57.4 % (ref 43.0–77.0)
Platelets: 307 10*3/uL (ref 150.0–400.0)
RBC: 4.79 Mil/uL (ref 3.87–5.11)
RDW: 16.6 % — ABNORMAL HIGH (ref 11.5–15.5)
WBC: 11.7 10*3/uL — ABNORMAL HIGH (ref 4.0–10.5)

## 2022-11-13 LAB — MICROALBUMIN / CREATININE URINE RATIO
Creatinine,U: 346 mg/dL
Microalb Creat Ratio: 0.5 mg/g (ref 0.0–30.0)
Microalb, Ur: 1.7 mg/dL (ref 0.0–1.9)

## 2022-11-13 MED ORDER — POTASSIUM CHLORIDE ER 10 MEQ PO TBCR
10.0000 meq | EXTENDED_RELEASE_TABLET | Freq: Two times a day (BID) | ORAL | 0 refills | Status: DC
Start: 2022-11-13 — End: 2023-02-10

## 2022-11-13 MED ORDER — TRELEGY ELLIPTA 100-62.5-25 MCG/ACT IN AEPB
1.0000 | INHALATION_SPRAY | Freq: Every day | RESPIRATORY_TRACT | 1 refills | Status: DC
Start: 2022-11-13 — End: 2024-03-11

## 2022-11-13 NOTE — Progress Notes (Signed)
Subjective:  Patient ID: Jenna Stephens, female    DOB: 1975/08/13  Age: 47 y.o. MRN: [id number]  CC: Hypertension, Diabetes, Asthma, and Anemia   HPI DANITZA SCHOENFELDT presents for f/up ---  Discussed the use of AI scribe software for clinical note transcription with the patient, who gave verbal consent to proceed.  History of Present Illness   The patient, recently hospitalized for an asthma exacerbation, reports improvement in symptoms. They completed a course of doxycycline and prednisone, and are currently using albuterol and Trelegy inhalers. The patient required forms to be completed due to the recent hospitalization.       Outpatient Medications Prior to Visit  Medication Sig Dispense Refill   albuterol (VENTOLIN HFA) 108 (90 Base) MCG/ACT inhaler Inhale 1-2 puffs into the lungs every 6 (six) hours as needed for wheezing or shortness of breath. (Patient taking differently: Inhale 2 puffs into the lungs as needed for wheezing or shortness of breath.) 8 g 0   amLODipine (NORVASC) 10 MG tablet Take 1 tablet (10 mg total) by mouth daily. 30 tablet 2   aspirin EC 81 MG tablet Take 1 tablet (81 mg total) by mouth daily. Swallow whole. 90 tablet 1   cariprazine (VRAYLAR) 1.5 MG capsule Take 1 capsule (1.5 mg total) by mouth daily. 90 capsule 0   clonazePAM (KLONOPIN) 1 MG tablet Take 1 tablet (1 mg total) by mouth 3 (three) times daily. 90 tablet 1   ipratropium-albuterol (DUONEB) 0.5-2.5 (3) MG/3ML SOLN Take 3 mLs by nebulization every 4 (four) hours as needed. (Patient taking differently: Take 3 mLs by nebulization as needed (wheezing/SOB).) 360 mL 1   naproxen sodium (ALEVE) 220 MG tablet Take 880 mg by mouth as needed (headache).     Nebivolol HCl 20 MG TABS Take 1 tablet (20 mg total) by mouth daily. 30 tablet 0   omeprazole (PRILOSEC) 40 MG capsule Take 1 capsule (40 mg total) by mouth daily. 90 capsule 1   rosuvastatin (CRESTOR) 20 MG tablet Take 1 tablet (20 mg total) by mouth  daily. 90 tablet 0   traZODone (DESYREL) 50 MG tablet Take 1 tablet (50 mg total) by mouth at bedtime. (Patient taking differently: Take 50-100 mg by mouth at bedtime.) 90 tablet 0   triamterene-hydrochlorothiazide (DYAZIDE) 37.5-25 MG capsule Take 1 each (1 capsule total) by mouth daily. 90 capsule 0   Vilazodone HCl 20 MG TABS TAKE 1 TABLET BY MOUTH EVERY DAY 90 tablet 0   doxycycline (VIBRA-TABS) 100 MG tablet Take 1 tablet (100 mg total) by mouth 2 (two) times daily. 10 tablet 0   predniSONE (DELTASONE) 10 MG tablet Prednisone 40 mg po daily x 1 day then Prednisone 30 mg po daily x 1 day then Prednisone 20 mg po daily x 1 day then Prednisone 10 mg daily x 1 day then stop... 10 tablet 0   No facility-administered medications prior to visit.    ROS Review of Systems  Constitutional:  Positive for fatigue.  HENT:  Negative for trouble swallowing.   Eyes: Negative.   Respiratory:  Positive for wheezing. Negative for cough, chest tightness and shortness of breath.   Cardiovascular:  Negative for chest pain and leg swelling.  Gastrointestinal: Negative.  Negative for abdominal pain, constipation, diarrhea and nausea.  Endocrine: Negative.   Genitourinary: Negative.   Musculoskeletal: Negative.   Skin: Negative.   Neurological: Negative.   Hematological:  Negative for adenopathy. Does not bruise/bleed easily.  Psychiatric/Behavioral:  Positive for decreased  concentration and dysphoric mood. Negative for agitation, behavioral problems, confusion, self-injury and suicidal ideas. The patient is nervous/anxious. The patient is not hyperactive.     Objective:  BP (!) 140/90 (BP Location: Left Arm, Patient Position: Sitting, Cuff Size: Large)   Pulse 84   Temp 98.1 F (36.7 C) (Oral)   Resp 16   Ht 5\' 8"  (1.727 m)   Wt 275 lb (124.7 kg)   LMP 11/12/2022 (Approximate)   SpO2 97%   BMI 41.81 kg/m   BP Readings from Last 3 Encounters:  11/13/22 (!) 140/90  11/07/22 (!) 147/93  10/03/22  (!) 152/110    Wt Readings from Last 3 Encounters:  11/13/22 275 lb (124.7 kg)  11/06/22 282 lb (127.9 kg)  10/03/22 284 lb (128.8 kg)    Physical Exam Vitals reviewed.  Constitutional:      General: She is not in acute distress.    Appearance: She is obese. She is not ill-appearing, toxic-appearing or diaphoretic.  HENT:     Nose: Nose normal.     Mouth/Throat:     Mouth: Mucous membranes are moist.  Eyes:     General: No scleral icterus.    Conjunctiva/sclera: Conjunctivae normal.  Cardiovascular:     Rate and Rhythm: Normal rate and regular rhythm.     Heart sounds: No murmur heard. Pulmonary:     Effort: Pulmonary effort is normal.     Breath sounds: No stridor. No wheezing, rhonchi or rales.  Abdominal:     General: Abdomen is protuberant. Bowel sounds are normal. There is no distension.     Palpations: Abdomen is soft. There is no fluid wave, hepatomegaly, splenomegaly or mass.     Tenderness: There is no abdominal tenderness.     Hernia: No hernia is present.  Musculoskeletal:     Cervical back: Neck supple.     Right lower leg: No edema.     Left lower leg: No edema.  Lymphadenopathy:     Cervical: No cervical adenopathy.  Skin:    General: Skin is warm and dry.  Neurological:     General: No focal deficit present.     Mental Status: Mental status is at baseline.  Psychiatric:        Attention and Perception: She is inattentive.        Mood and Affect: Mood is anxious and depressed. Affect is flat. Affect is not tearful.        Speech: Speech normal. Speech is not delayed.        Behavior: Behavior is slowed. Behavior is cooperative.        Thought Content: Thought content normal. Thought content is not paranoid or delusional. Thought content does not include homicidal or suicidal ideation.        Cognition and Memory: Cognition normal.        Judgment: Judgment normal.     Lab Results  Component Value Date   WBC 11.7 (H) 11/13/2022   HGB 10.4 (L)  11/13/2022   HCT 35.5 (L) 11/13/2022   PLT 307.0 11/13/2022   GLUCOSE 113 (H) 11/13/2022   CHOL 230 (H) 10/03/2022   TRIG 197.0 (H) 10/03/2022   HDL 46.90 10/03/2022   LDLCALC 144 (H) 10/03/2022   ALT 19 11/07/2022   AST 13 (L) 11/07/2022   NA 138 11/13/2022   K 3.1 (L) 11/13/2022   CL 101 11/13/2022   CREATININE 0.77 11/13/2022   BUN 14 11/13/2022   CO2 31 11/13/2022  TSH 0.97 10/03/2022   INR 0.99 11/28/2017   HGBA1C 6.0 10/03/2022   MICROALBUR 1.7 11/13/2022    DG Chest Port 1 View  Result Date: 11/06/2022 CLINICAL DATA:  Shortness of breath. EXAM: PORTABLE CHEST 1 VIEW COMPARISON:  October 15, 2019. FINDINGS: The heart size and mediastinal contours are within normal limits. Both lungs are clear. The visualized skeletal structures are unremarkable. IMPRESSION: No active disease. Electronically Signed   By: Lupita Raider M.D.   On: 11/06/2022 08:52    Assessment & Plan:   Malignant hypertension- Her blood pressure has improved.  I will treat the hypokalemia. -     Basic metabolic panel; Future -     Potassium Chloride ER; Take 1 tablet (10 mEq total) by mouth 2 (two) times daily.  Dispense: 180 tablet; Refill: 0  Severe persistent asthma with acute exacerbation- Improvement noted. Will continue Trelegy. -     Trelegy Ellipta; Inhale 1 puff into the lungs daily.  Dispense: 120 each; Refill: 1  Type 2 diabetes mellitus without complication, without long-term current use of insulin (HCC)- Her blood sugar is well-controlled. -     Microalbumin / creatinine urine ratio; Future  Need for hepatitis C screening test -     Hepatitis C antibody; Future  Iron deficiency anemia due to chronic blood loss -     CBC with Differential/Platelet; Future  Diuretic-induced hypokalemia -     Potassium Chloride ER; Take 1 tablet (10 mEq total) by mouth 2 (two) times daily.  Dispense: 180 tablet; Refill: 0     Follow-up: Return in about 3 months (around 02/13/2023).  Sanda Linger, MD

## 2022-11-13 NOTE — Patient Instructions (Signed)
Asthma, Adult  Asthma is a long-term (chronic) condition that causes recurrent episodes in which the lower airways in the lungs become tight and narrow. The narrowing is caused by inflammation and tightening of the smooth muscle around the lower airways. Asthma episodes, also called asthma attacks or asthma flares, may cause coughing, making high-pitched whistling sounds when you breathe, most often when you breathe out (wheezing), shortness of breath, and chest pain. The airways may produce extra mucus caused by the inflammation and irritation. During an attack, it can be difficult to breathe. Asthma attacks can range from minor to life-threatening. Asthma cannot be cured, but medicines and lifestyle changes can help control it and treat acute attacks. It is important to keep your asthma well controlled so the condition does not interfere with your daily life. What are the causes? This condition is believed to be caused by inherited (genetic) and environmental factors, but its exact cause is not known. What can trigger an asthma attack? Many things can bring on an asthma attack or make symptoms worse. These triggers are different for every person. Common triggers include: Allergens and irritants like mold, dust, pet dander, cockroaches, pollen, air pollution, and chemical odors. Cigarette smoke. Weather changes and cold air. Stress and strong emotional responses such as crying or laughing hard. Certain medications such as aspirin or beta blockers. Infections and inflammatory conditions, such as the flu, a cold, pneumonia, or inflammation of the nasal membranes (rhinitis). Gastroesophageal reflux disease (GERD). What are the signs or symptoms? Symptoms may occur right after exposure to an asthma trigger or hours later and can vary by person. Common signs and symptoms include: Wheezing. Trouble breathing (shortness of breath). Excessive nighttime or early morning coughing. Chest  tightness. Tiredness (fatigue) with minimal activity. Difficulty talking in complete sentences. Poor exercise tolerance. How is this diagnosed? This condition is diagnosed based on: A physical exam and your medical history. Tests, which may include: Lung function studies to evaluate the flow of air in your lungs. Allergy tests. Imaging tests, such as X-rays. How is this treated? There is no cure, but symptoms can be controlled with proper treatment. Treatment usually involves: Identifying and avoiding your asthma triggers. Inhaled medicines. Two types are commonly used to treat asthma, depending on severity: Controller medicines. These help prevent asthma symptoms from occurring. They are taken every day. Fast-acting reliever or rescue medicines. These quickly relieve asthma symptoms. They are used as needed and provide short-term relief. Using other medicines, such as: Allergy medicines, such as antihistamines, if your asthma attacks are triggered by allergens. Immune medicines (immunomodulators). These are medicines that help control the immune system. Using supplemental oxygen. This is only needed during a severe episode. Creating an asthma action plan. An asthma action plan is a written plan for managing and treating your asthma attacks. This plan includes: A list of your asthma triggers and how to avoid them. Information about when medicines should be taken and when their dosage should be changed. Instructions about using a device called a peak flow meter. A peak flow meter measures how well the lungs are working and the severity of your asthma. It helps you monitor your condition. Follow these instructions at home: Take over-the-counter and prescription medicines only as told by your health care provider. Stay up to date on all vaccinations as recommended by your healthcare provider, including vaccines for the flu and pneumonia. Use a peak flow meter and keep track of your peak flow  readings. Understand and use your asthma   action plan to address any asthma flares. Do not smoke or allow anyone to smoke in your home. Contact a health care provider if: You have wheezing, shortness of breath, or a cough that is not responding to medicines. Your medicines are causing side effects, such as a rash, itching, swelling, or trouble breathing. You need to use a reliever medicine more than 2-3 times a week. Your peak flow reading is still at 50-79% of your personal best after following your action plan for 1 hour. You have a fever and shortness of breath. Get help right away if: You are getting worse and do not respond to treatment during an asthma attack. You are short of breath when at rest or when doing very little physical activity. You have difficulty eating, drinking, or talking. You have chest pain or tightness. You develop a fast heartbeat or palpitations. You have a bluish color to your lips or fingernails. You are light-headed or dizzy, or you faint. Your peak flow reading is less than 50% of your personal best. You feel too tired to breathe normally. These symptoms may be an emergency. Get help right away. Call 911. Do not wait to see if the symptoms will go away. Do not drive yourself to the hospital. Summary Asthma is a long-term (chronic) condition that causes recurrent episodes in which the airways become tight and narrow. Asthma episodes, also called asthma attacks or asthma flares, can cause coughing, wheezing, shortness of breath, and chest pain. Asthma cannot be cured, but medicines and lifestyle changes can help keep it well controlled and prevent asthma flares. Make sure you understand how to avoid triggers and how and when to use your medicines. Asthma attacks can range from minor to life-threatening. Get help right away if you have an asthma attack and do not respond to treatment with your usual rescue medicines. This information is not intended to replace  advice given to you by your health care provider. Make sure you discuss any questions you have with your health care provider. Document Revised: 01/25/2021 Document Reviewed: 01/16/2021 Elsevier Patient Education  2024 [employer]  

## 2022-11-14 LAB — HEPATITIS C ANTIBODY: Hepatitis C Ab: NONREACTIVE

## 2022-11-20 ENCOUNTER — Other Ambulatory Visit: Payer: BC Managed Care – PPO

## 2022-11-23 ENCOUNTER — Encounter: Payer: Self-pay | Admitting: Internal Medicine

## 2022-11-26 ENCOUNTER — Other Ambulatory Visit: Payer: Self-pay | Admitting: Internal Medicine

## 2022-12-04 ENCOUNTER — Other Ambulatory Visit: Payer: Self-pay | Admitting: Internal Medicine

## 2022-12-04 ENCOUNTER — Ambulatory Visit: Payer: BC Managed Care – PPO | Admitting: Internal Medicine

## 2022-12-04 DIAGNOSIS — E785 Hyperlipidemia, unspecified: Secondary | ICD-10-CM

## 2022-12-19 ENCOUNTER — Encounter: Payer: Self-pay | Admitting: Internal Medicine

## 2022-12-19 MED ORDER — HYDROXYZINE HCL 10 MG PO TABS: 10.0000 mg | ORAL_TABLET | Freq: Three times a day (TID) | ORAL | 0 refills | Status: DC | PRN

## 2022-12-19 NOTE — Telephone Encounter (Signed)
Done erx to [facility] on pharmacy list

## 2022-12-25 ENCOUNTER — Other Ambulatory Visit: Payer: Self-pay | Admitting: Internal Medicine

## 2022-12-25 DIAGNOSIS — F411 Generalized anxiety disorder: Secondary | ICD-10-CM

## 2022-12-25 DIAGNOSIS — I10 Essential (primary) hypertension: Secondary | ICD-10-CM

## 2022-12-25 DIAGNOSIS — F332 Major depressive disorder, recurrent severe without psychotic features: Secondary | ICD-10-CM

## 2023-01-17 ENCOUNTER — Other Ambulatory Visit: Payer: Self-pay | Admitting: Internal Medicine

## 2023-01-18 ENCOUNTER — Other Ambulatory Visit: Payer: Self-pay

## 2023-02-10 ENCOUNTER — Other Ambulatory Visit: Payer: Self-pay | Admitting: Internal Medicine

## 2023-02-10 DIAGNOSIS — E876 Hypokalemia: Secondary | ICD-10-CM

## 2023-02-10 DIAGNOSIS — I10 Essential (primary) hypertension: Secondary | ICD-10-CM

## 2023-03-04 DIAGNOSIS — M7542 Impingement syndrome of left shoulder: Secondary | ICD-10-CM | POA: Diagnosis not present

## 2023-03-04 DIAGNOSIS — M25512 Pain in left shoulder: Secondary | ICD-10-CM | POA: Diagnosis not present

## 2023-03-23 ENCOUNTER — Other Ambulatory Visit: Payer: Self-pay | Admitting: Internal Medicine

## 2023-03-23 DIAGNOSIS — F332 Major depressive disorder, recurrent severe without psychotic features: Secondary | ICD-10-CM

## 2023-03-23 DIAGNOSIS — I10 Essential (primary) hypertension: Secondary | ICD-10-CM

## 2023-03-30 ENCOUNTER — Other Ambulatory Visit: Payer: Self-pay | Admitting: Internal Medicine

## 2023-03-30 DIAGNOSIS — F411 Generalized anxiety disorder: Secondary | ICD-10-CM

## 2023-03-30 DIAGNOSIS — E785 Hyperlipidemia, unspecified: Secondary | ICD-10-CM

## 2023-03-30 DIAGNOSIS — F332 Major depressive disorder, recurrent severe without psychotic features: Secondary | ICD-10-CM

## 2023-04-15 ENCOUNTER — Other Ambulatory Visit: Payer: Self-pay | Admitting: Internal Medicine

## 2023-04-15 DIAGNOSIS — E785 Hyperlipidemia, unspecified: Secondary | ICD-10-CM

## 2023-12-25 ENCOUNTER — Encounter: Payer: Self-pay | Admitting: Internal Medicine

## 2023-12-26 ENCOUNTER — Ambulatory Visit (INDEPENDENT_AMBULATORY_CARE_PROVIDER_SITE_OTHER): Admitting: Internal Medicine

## 2023-12-26 ENCOUNTER — Encounter: Payer: Self-pay | Admitting: Internal Medicine

## 2023-12-26 VITALS — BP 160/98

## 2023-12-26 DIAGNOSIS — F410 Panic disorder [episodic paroxysmal anxiety] without agoraphobia: Secondary | ICD-10-CM | POA: Diagnosis not present

## 2023-12-26 DIAGNOSIS — F5104 Psychophysiologic insomnia: Secondary | ICD-10-CM

## 2023-12-26 DIAGNOSIS — F329 Major depressive disorder, single episode, unspecified: Secondary | ICD-10-CM | POA: Diagnosis not present

## 2023-12-26 DIAGNOSIS — G4700 Insomnia, unspecified: Secondary | ICD-10-CM | POA: Insufficient documentation

## 2023-12-26 MED ORDER — OLANZAPINE-FLUOXETINE HCL 6-50 MG PO CAPS: 1.0000 | ORAL_CAPSULE | Freq: Every day | ORAL | 3 refills | Status: DC

## 2023-12-26 MED ORDER — ALPRAZOLAM 1 MG PO TABS: 1.0000 mg | ORAL_TABLET | Freq: Two times a day (BID) | ORAL | 1 refills | Status: DC | PRN

## 2023-12-26 NOTE — Assessment & Plan Note (Signed)
  She is currently in a lot of emotional distress.  Discussed.  She started to see a psychologist yesterday.  She was encouraged to continue.  She will try to establish with her daughters psychiatrist. Due to desperate circumstances I prescribed Symbyax  6-50 at at bedtime and alprazolam  1 mg twice daily as needed. She understands potential risks of weight gain, diabetes etc. good diet and lifestyle were discussed.  Potential benefits of a long term benzodiazepines  use as well as potential risks  and complications were explained to the patient and were aknowledged. She will follow-up with Dr. Joshua in 10 days.

## 2023-12-26 NOTE — Assessment & Plan Note (Addendum)
 Jenna Stephens presents for severe anxiety, severe depression depression and insomnia over the past several weeks.  She has been under a lot of stress at home with her mother-in-law being sick, her husband being sick with pancreatitis, her teenage daughter being depressed and cutting her wrists recently.  She gets no more than 1 hour of sleep per night.  Her mind is constantly racing.  She gets no rest.  She got to talk to her daughter psychologist yesterday and making arrangements for regular visits.  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  She is using clonazepam .  Is not working.  She does not like the idea of taking too many medicines. She denies using drugs.  Very little alcohol.  Smokes 2 cigarettes a day. She wishes she would go to sleep and never wake up.  No suicidal plans Go to ER or call 911 if worse She is currently in a lot of emotional distress.  Discussed.  She started to see a psychologist yesterday.  She was encouraged to continue.  She will try to establish with her daughters psychiatrist. Due to desperate circumstances I prescribed Symbyax  6-50 at at bedtime and alprazolam  1 mg twice daily as needed. She understands potential risks of weight gain, diabetes etc. good diet and lifestyle were discussed.  Potential benefits of a long term benzodiazepines  use as well as potential risks  and complications were explained to the patient and were aknowledged. She will follow-up with Dr. Joshua in 10 days.

## 2023-12-26 NOTE — Progress Notes (Signed)
 Subjective:  Patient ID: Jenna Stephens, female    DOB: 01-04-1976  Age: 48 y.o. MRN: [id number]  CC: Panic Attack (Last one was yesterday; haven't been sleeping; been feeling bad for about a month; lost my mother, daughter is suicidal, overwhelmed with school and work) and Medication Problem (May need refills on all, havn't taken any since beginning of the year)   HPI Jenna Stephens presents for severe anxiety, severe depression depression and insomnia over the past several weeks.  She has been under a lot of stress at home with her mother-in-law being sick, her husband being sick with pancreatitis, her teenage daughter being depressed and cutting her wrists recently.  She gets no more than 1 hour of sleep per night.  Her mind is constantly racing.  She gets no rest.  She got to talk to her daughter psychologist yesterday and making arrangements for regular visits.  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  She is using clonazepam .  Is not working.  She does not like the idea of taking too many medicines. She denies using drugs.  Very little alcohol.  Smokes 2 cigarettes a day. She wishes she would go to sleep and never wake up.  No suicidal plans   Outpatient Medications Prior to Visit  Medication Sig Dispense Refill   albuterol  (VENTOLIN  HFA) 108 (90 Base) MCG/ACT inhaler Inhale 1-2 puffs into the lungs every 6 (six) hours as needed for wheezing or shortness of breath. (Patient taking differently: Inhale 2 puffs into the lungs as needed for wheezing or shortness of breath.) 8 g 0   amLODipine  (NORVASC ) 10 MG tablet Take 1 tablet (10 mg total) by mouth daily. 30 tablet 2   [facility] ASPIRIN  LOW DOSE 81 MG tablet TAKE 1 TABLET (81 MG TOTAL) BY MOUTH DAILY. SWALLOW WHOLE. 90 tablet 1   Fluticasone -Umeclidin-Vilant (TRELEGY ELLIPTA ) 100-62.5-25 MCG/ACT AEPB Inhale 1 puff into  the lungs daily. 120 each 1   ipratropium-albuterol  (DUONEB) 0.5-2.5 (3) MG/3ML SOLN Take 3 mLs by nebulization every 4 (four) hours as needed. (Patient taking differently: Take 3 mLs by nebulization as needed (wheezing/SOB).) 360 mL 1   naproxen  sodium (ALEVE ) 220 MG tablet Take 880 mg by mouth as needed (headache).     Nebivolol  HCl 20 MG TABS Take 1 tablet (20 mg total) by mouth daily. 30 tablet 0   omeprazole (PRILOSEC) 40 MG capsule Take 1 capsule (40 mg total) by mouth daily. 90 capsule 1   potassium chloride  (KLOR-CON ) 10 MEQ tablet TAKE 1 TABLET BY MOUTH 2 TIMES DAILY. 180 tablet 0   rosuvastatin  (CRESTOR ) 20 MG tablet TAKE 1 TABLET BY MOUTH EVERY DAY 90 tablet 0   triamterene -hydrochlorothiazide  (DYAZIDE ) 37.5-25 MG capsule TAKE 1 CAPSULE BY MOUTH DAILY. 90 capsule 0   cariprazine  (VRAYLAR ) 1.5 MG capsule Take 1 capsule (1.5 mg total) by mouth daily. 90 capsule 0   clonazePAM  (KLONOPIN ) 1 MG tablet Take 1 tablet (1 mg total) by mouth 3 (three) times daily. 90 tablet 1   hydrOXYzine  (ATARAX ) 10 MG tablet TAKE 1 TABLET(10 MG) BY MOUTH THREE TIMES DAILY AS NEEDED 90 tablet 0   traZODone  (DESYREL ) 50 MG tablet TAKE 1 TABLET BY MOUTH EVERYDAY AT BEDTIME 90 tablet 0   Vilazodone  HCl 20 MG TABS TAKE 1 TABLET BY MOUTH EVERY DAY 90 tablet 0  No facility-administered medications prior to visit.    ROS: Review of Systems  Constitutional:  Positive for fatigue. Negative for activity change, appetite change, chills, fever and unexpected weight change.  HENT:  Positive for postnasal drip and rhinorrhea. Negative for congestion, mouth sores and sinus pressure.   Eyes:  Negative for visual disturbance.  Respiratory:  Negative for cough and chest tightness.   Gastrointestinal:  Negative for abdominal pain, diarrhea and nausea.  Genitourinary:  Negative for difficulty urinating, frequency and vaginal pain.  Musculoskeletal:  Negative for arthralgias, back pain and gait problem.  Skin:  Negative for  pallor and rash.  Neurological:  Positive for dizziness and headaches. Negative for tremors, weakness and numbness.  Psychiatric/Behavioral:  Positive for agitation, decreased concentration, dysphoric mood and sleep disturbance. Negative for behavioral problems, confusion, hallucinations, self-injury and suicidal ideas. The patient is nervous/anxious. The patient is not hyperactive.     Objective:  BP (!) 160/98   BP Readings from Last 3 Encounters:  12/26/23 (!) 160/98  11/13/22 (!) 140/90  11/07/22 (!) 147/93    Wt Readings from Last 3 Encounters:  11/13/22 275 lb (124.7 kg)  11/06/22 282 lb (127.9 kg)  10/03/22 284 lb (128.8 kg)    Physical Exam Constitutional:      General: She is in acute distress.     Appearance: She is well-developed. She is obese.  HENT:     Head: Normocephalic.     Right Ear: External ear normal.     Left Ear: External ear normal.     Nose: Nose normal.     Mouth/Throat:     Pharynx: No posterior oropharyngeal erythema.  Eyes:     General:        Right eye: No discharge.        Left eye: No discharge.     Pupils: Pupils are equal, round, and reactive to light.  Neck:     Thyroid : No thyromegaly.     Vascular: No JVD.     Trachea: No tracheal deviation.  Cardiovascular:     Rate and Rhythm: Normal rate and regular rhythm.     Heart sounds: Normal heart sounds.  Pulmonary:     Effort: No respiratory distress.     Breath sounds: No stridor. No wheezing.  Abdominal:     General: Bowel sounds are normal. There is no distension.     Palpations: Abdomen is soft. There is no mass.     Tenderness: There is no abdominal tenderness. There is no guarding or rebound.  Musculoskeletal:        General: No tenderness.     Cervical back: Normal range of motion and neck supple. No rigidity.  Lymphadenopathy:     Cervical: No cervical adenopathy.  Skin:    Findings: No erythema or rash.  Neurological:     Mental Status: She is oriented to person,  place, and time.     Cranial Nerves: No cranial nerve deficit.     Motor: No abnormal muscle tone.     Coordination: Coordination normal.     Gait: Gait normal.     Deep Tendon Reflexes: Reflexes normal.  Psychiatric:        Behavior: Behavior normal.        Thought Content: Thought content normal.        Judgment: Judgment normal.   The patient is in moderate to severe emotional distress.  She is crying and sobbing.  She is very upset.  Lab  Results  Component Value Date   WBC 11.7 (H) 11/13/2022   HGB 10.4 (L) 11/13/2022   HCT 35.5 (L) 11/13/2022   PLT 307.0 11/13/2022   GLUCOSE 113 (H) 11/13/2022   CHOL 230 (H) 10/03/2022   TRIG 197.0 (H) 10/03/2022   HDL 46.90 10/03/2022   LDLCALC 144 (H) 10/03/2022   ALT 19 11/07/2022   AST 13 (L) 11/07/2022   NA 138 11/13/2022   K 3.1 (L) 11/13/2022   CL 101 11/13/2022   CREATININE 0.77 11/13/2022   BUN 14 11/13/2022   CO2 31 11/13/2022   TSH 0.97 10/03/2022   INR 0.99 11/28/2017   HGBA1C 6.0 10/03/2022    DG Chest Port 1 View Result Date: 11/06/2022 CLINICAL DATA:  Shortness of breath. EXAM: PORTABLE CHEST 1 VIEW COMPARISON:  October 15, 2019. FINDINGS: The heart size and mediastinal contours are within normal limits. Both lungs are clear. The visualized skeletal structures are unremarkable. IMPRESSION: No active disease. Electronically Signed   By: Lynwood Landy Raddle M.D.   On: 11/06/2022 08:52    Assessment & Plan:   Problem List Items Addressed This Visit     Depression - Primary   Jenna Stephens presents for severe anxiety, severe depression depression and insomnia over the past several weeks.  She has been under a lot of stress at home with her mother-in-law being sick, her husband being sick with pancreatitis, her teenage daughter being depressed and cutting her wrists recently.  She gets no more than 1 hour of sleep per night.  Her mind is constantly racing.  She gets no rest.  She got to talk to her daughter psychologist  yesterday and making arrangements for regular visits.  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  She is using clonazepam .  Is not working.  She does not like the idea of taking too many medicines. She denies using drugs.  Very little alcohol.  Smokes 2 cigarettes a day. She wishes she would go to sleep and never wake up.  No suicidal plans Go to ER or call 911 if worse She is currently in a lot of emotional distress.  Discussed.  She started to see a psychologist yesterday.  She was encouraged to continue.  She will try to establish with her daughters psychiatrist. Due to desperate circumstances I prescribed Symbyax  6-50 at at bedtime and alprazolam  1 mg twice daily as needed. She understands potential risks of weight gain, diabetes etc. good diet and lifestyle were discussed.  Potential benefits of a long term benzodiazepines  use as well as potential risks  and complications were explained to the patient and were aknowledged. She will follow-up with Dr. Joshua in 10 days.       Relevant Medications   ALPRAZolam  (XANAX ) 1 MG tablet   Insomnia    She is currently in a lot of emotional distress.  Discussed.  She started to see a psychologist yesterday.  She was encouraged to continue.  She will try to establish with her daughters psychiatrist. Due to desperate circumstances I prescribed Symbyax  6-50 at at bedtime and alprazolam  1 mg twice daily as needed. She understands potential risks of weight gain, diabetes etc. good diet and lifestyle were discussed.  Potential benefits of a long term benzodiazepines  use as well as potential risks  and complications were explained to the  patient and were aknowledged. She will follow-up with Dr. Joshua in 10 days.      Panic attacks    She is currently in a lot of emotional distress.  Discussed.  She started to see a  psychologist yesterday.  She was encouraged to continue.  She will try to establish with her daughters psychiatrist. Due to desperate circumstances I prescribed Symbyax  6-50 at at bedtime and alprazolam  1 mg twice daily as needed. She understands potential risks of weight gain, diabetes etc. good diet and lifestyle were discussed.  Potential benefits of a long term benzodiazepines  use as well as potential risks  and complications were explained to the patient and were aknowledged. She will follow-up with Dr. Joshua in 10 days.      Relevant Medications   ALPRAZolam  (XANAX ) 1 MG tablet      Meds ordered this encounter  Medications   OLANZapine -FLUoxetine  (SYMBYAX ) 6-50 MG capsule    Sig: Take 1 capsule by mouth at bedtime.    Dispense:  30 capsule    Refill:  3   ALPRAZolam  (XANAX ) 1 MG tablet    Sig: Take 1 tablet (1 mg total) by mouth 2 (two) times daily as needed for anxiety.    Dispense:  60 tablet    Refill:  1      Follow-up: Return in about 10 days (around 01/05/2024) for f/u with PCP.  Marolyn Noel, MD

## 2024-01-10 ENCOUNTER — Encounter: Payer: Self-pay | Admitting: Internal Medicine

## 2024-01-14 NOTE — Telephone Encounter (Signed)
 Called patient, patient had it extended and talked to Dr. Joshua MA and they said they will take care of the letter. Per patient.

## 2024-01-21 ENCOUNTER — Ambulatory Visit (INDEPENDENT_AMBULATORY_CARE_PROVIDER_SITE_OTHER): Admitting: Internal Medicine

## 2024-01-21 ENCOUNTER — Encounter: Payer: Self-pay | Admitting: Internal Medicine

## 2024-01-21 VITALS — BP 166/104 | HR 99 | Temp 98.3°F | Resp 16 | Ht 68.0 in | Wt 280.2 lb

## 2024-01-21 DIAGNOSIS — T502X5A Adverse effect of carbonic-anhydrase inhibitors, benzothiadiazides and other diuretics, initial encounter: Secondary | ICD-10-CM

## 2024-01-21 DIAGNOSIS — E119 Type 2 diabetes mellitus without complications: Secondary | ICD-10-CM | POA: Diagnosis not present

## 2024-01-21 DIAGNOSIS — Z124 Encounter for screening for malignant neoplasm of cervix: Secondary | ICD-10-CM

## 2024-01-21 DIAGNOSIS — E66813 Obesity, class 3: Secondary | ICD-10-CM

## 2024-01-21 DIAGNOSIS — Z1211 Encounter for screening for malignant neoplasm of colon: Secondary | ICD-10-CM

## 2024-01-21 DIAGNOSIS — E785 Hyperlipidemia, unspecified: Secondary | ICD-10-CM

## 2024-01-21 DIAGNOSIS — I10 Essential (primary) hypertension: Secondary | ICD-10-CM | POA: Diagnosis not present

## 2024-01-21 DIAGNOSIS — R9431 Abnormal electrocardiogram [ECG] [EKG]: Secondary | ICD-10-CM

## 2024-01-21 DIAGNOSIS — Z0000 Encounter for general adult medical examination without abnormal findings: Secondary | ICD-10-CM

## 2024-01-21 DIAGNOSIS — E876 Hypokalemia: Secondary | ICD-10-CM | POA: Diagnosis not present

## 2024-01-21 DIAGNOSIS — F411 Generalized anxiety disorder: Secondary | ICD-10-CM

## 2024-01-21 DIAGNOSIS — Z0001 Encounter for general adult medical examination with abnormal findings: Secondary | ICD-10-CM

## 2024-01-21 DIAGNOSIS — Z23 Encounter for immunization: Secondary | ICD-10-CM | POA: Diagnosis not present

## 2024-01-21 DIAGNOSIS — D72829 Elevated white blood cell count, unspecified: Secondary | ICD-10-CM

## 2024-01-21 DIAGNOSIS — D500 Iron deficiency anemia secondary to blood loss (chronic): Secondary | ICD-10-CM

## 2024-01-21 DIAGNOSIS — Z1231 Encounter for screening mammogram for malignant neoplasm of breast: Secondary | ICD-10-CM

## 2024-01-21 LAB — URINALYSIS, ROUTINE W REFLEX MICROSCOPIC
Bilirubin Urine: NEGATIVE
Hgb urine dipstick: NEGATIVE
Ketones, ur: NEGATIVE
Leukocytes,Ua: NEGATIVE
Nitrite: NEGATIVE
RBC / HPF: NONE SEEN (ref 0–?)
Specific Gravity, Urine: 1.02 (ref 1.000–1.030)
Total Protein, Urine: NEGATIVE
Urine Glucose: NEGATIVE
Urobilinogen, UA: 0.2 (ref 0.0–1.0)
pH: 6.5 (ref 5.0–8.0)

## 2024-01-21 LAB — CBC WITH DIFFERENTIAL/PLATELET
Basophils Absolute: 0 K/uL (ref 0.0–0.1)
Basophils Relative: 0.3 % (ref 0.0–3.0)
Eosinophils Absolute: 0.1 K/uL (ref 0.0–0.7)
Eosinophils Relative: 0.6 % (ref 0.0–5.0)
HCT: 33 % — ABNORMAL LOW (ref 36.0–46.0)
Hemoglobin: 9.7 g/dL — ABNORMAL LOW (ref 12.0–15.0)
Lymphocytes Relative: 19.1 % (ref 12.0–46.0)
Lymphs Abs: 2.5 K/uL (ref 0.7–4.0)
MCHC: 29.5 g/dL — ABNORMAL LOW (ref 30.0–36.0)
MCV: 64.2 fl — ABNORMAL LOW (ref 78.0–100.0)
Monocytes Absolute: 0.8 K/uL (ref 0.1–1.0)
Monocytes Relative: 6.1 % (ref 3.0–12.0)
Neutro Abs: 9.6 K/uL — ABNORMAL HIGH (ref 1.4–7.7)
Neutrophils Relative %: 73.9 % (ref 43.0–77.0)
Platelets: 315 K/uL (ref 150.0–400.0)
RBC: 5.14 Mil/uL — ABNORMAL HIGH (ref 3.87–5.11)
RDW: 19 % — ABNORMAL HIGH (ref 11.5–15.5)
WBC: 13 K/uL — ABNORMAL HIGH (ref 4.0–10.5)

## 2024-01-21 LAB — HEPATIC FUNCTION PANEL
ALT: 7 U/L (ref 0–35)
AST: 13 U/L (ref 0–37)
Albumin: 4 g/dL (ref 3.5–5.2)
Alkaline Phosphatase: 69 U/L (ref 39–117)
Bilirubin, Direct: 0 mg/dL (ref 0.0–0.3)
Total Bilirubin: 0.4 mg/dL (ref 0.2–1.2)
Total Protein: 7.1 g/dL (ref 6.0–8.3)

## 2024-01-21 LAB — BASIC METABOLIC PANEL WITH GFR
BUN: 11 mg/dL (ref 6–23)
CO2: 29 meq/L (ref 19–32)
Calcium: 8.9 mg/dL (ref 8.4–10.5)
Chloride: 104 meq/L (ref 96–112)
Creatinine, Ser: 0.68 mg/dL (ref 0.40–1.20)
GFR: 104.67 mL/min (ref >60.00)
Glucose, Bld: 164 mg/dL — ABNORMAL HIGH (ref 70–99)
Potassium: 3.3 meq/L — ABNORMAL LOW (ref 3.5–5.1)
Sodium: 141 meq/L (ref 135–145)

## 2024-01-21 LAB — TSH: TSH: 0.5 u[IU]/mL (ref 0.35–5.50)

## 2024-01-21 LAB — IBC + FERRITIN
Ferritin: 3.8 ng/mL — ABNORMAL LOW (ref 10.0–291.0)
Iron: 21 ug/dL — ABNORMAL LOW (ref 42–145)
Saturation Ratios: 3.9 % — ABNORMAL LOW (ref 20.0–50.0)
TIBC: 533.4 ug/dL — ABNORMAL HIGH (ref 250.0–450.0)
Transferrin: 381 mg/dL — ABNORMAL HIGH (ref 212.0–360.0)

## 2024-01-21 LAB — LIPID PANEL
Cholesterol: 266 mg/dL — ABNORMAL HIGH (ref 0–200)
HDL: 50.5 mg/dL (ref >39.00)
LDL Cholesterol: 185 mg/dL — ABNORMAL HIGH (ref 0–99)
NonHDL: 215.25
Total CHOL/HDL Ratio: 5
Triglycerides: 150 mg/dL — ABNORMAL HIGH (ref 0.0–149.0)
VLDL: 30 mg/dL (ref 0.0–40.0)

## 2024-01-21 LAB — MICROALBUMIN / CREATININE URINE RATIO
Creatinine,U: 211.3 mg/dL
Microalb Creat Ratio: 5.5 mg/g (ref 0.0–30.0)
Microalb, Ur: 1.2 mg/dL (ref 0.0–1.9)

## 2024-01-21 LAB — TROPONIN I (HIGH SENSITIVITY): High Sens Troponin I: 5 ng/L (ref 2–17)

## 2024-01-21 LAB — HEMOGLOBIN A1C: Hgb A1c MFr Bld: 7.2 % — ABNORMAL HIGH (ref 4.6–6.5)

## 2024-01-21 MED ORDER — ROSUVASTATIN CALCIUM 20 MG PO TABS: 20.0000 mg | ORAL_TABLET | Freq: Every day | ORAL | 0 refills | Status: DC

## 2024-01-21 MED ORDER — NADOLOL 20 MG PO TABS: 20.0000 mg | ORAL_TABLET | Freq: Every day | ORAL | 0 refills | Status: DC

## 2024-01-21 MED ORDER — TRIAMTERENE-HCTZ 37.5-25 MG PO CAPS
1.0000 | ORAL_CAPSULE | Freq: Every day | ORAL | 0 refills | Status: DC
Start: 2024-01-21 — End: 2024-01-21

## 2024-01-21 MED ORDER — ACCRUFER 30 MG PO CAPS: 1.0000 | ORAL_CAPSULE | Freq: Two times a day (BID) | ORAL | 0 refills | Status: AC

## 2024-01-21 MED ORDER — TRIAMTERENE-HCTZ 37.5-25 MG PO CAPS: 1.0000 | ORAL_CAPSULE | Freq: Every day | ORAL | 0 refills | Status: DC

## 2024-01-21 NOTE — Patient Instructions (Addendum)

## 2024-01-21 NOTE — Progress Notes (Unsigned)
 Subjective:  Patient ID: Jenna Stephens, female    DOB: 1976/02/24  Age: 48 y.o. MRN: [id number]  CC: Mental Health Issues  (Patient states that she was doing fine until the ending of June when the anniversary of her aunts passing came around her mother couldn't handle it. Then her mother in law had a seizure and she she passed away. Then come to find out her husband is sick. Her daughter has mental health issues and she had a episode where she starter cutting herself. Then her sister child passed away. ), Hypertension, Anemia, Hyperlipidemia, and Annual Exam   HPI Jenna Stephens presents for a CPX and f/up ---  Discussed the use of AI scribe software for clinical note transcription with the patient, who gave verbal consent to proceed.  History of Present Illness Jenna Stephens is a 48 year old female who presents with fatigue and anxiety.  She experiences persistent fatigue and describes her sleep as irregular, often dozing off for a couple of hours and then being unable to return to sleep. Her mind is constantly active, contributing to her anxiety, which she rates as 'on ten'.  She has been experiencing chest pain and shortness of breath for about a month, which she attributes to severe anxiety. The symptoms are exacerbated when she becomes emotionally worked up, leading to wheezing. Her anxiety is heightened by personal stressors.  She is currently taking Xanax  and symbyax , which helps her relax and she finds it helps her sleep, but it does not consistently aid her sleep.  No thoughts of self-harm or harm to others, although she admits to feeling 'down in the dark' and sometimes wishing she could 'go to sleep and not get up'.  Her last menstrual cycle was around September 6th, following a gap since May. She is not on any form of contraception and denies any possibility of pregnancy.  She reports headaches, occasional chest pain, and shortness of breath. No current trouble breathing  unless emotionally distressed. She has not received a flu shot yet this season.   Outpatient Medications Prior to Visit  Medication Sig Dispense Refill   albuterol  (VENTOLIN  HFA) 108 (90 Base) MCG/ACT inhaler Inhale 1-2 puffs into the lungs every 6 (six) hours as needed for wheezing or shortness of breath. (Patient taking differently: Inhale 2 puffs into the lungs as needed for wheezing or shortness of breath.) 8 g 0   ALPRAZolam  (XANAX ) 1 MG tablet Take 1 tablet (1 mg total) by mouth 2 (two) times daily as needed for anxiety. 60 tablet 1   [facility] ASPIRIN  LOW DOSE 81 MG tablet TAKE 1 TABLET (81 MG TOTAL) BY MOUTH DAILY. SWALLOW WHOLE. 90 tablet 1   Fluticasone -Umeclidin-Vilant (TRELEGY ELLIPTA ) 100-62.5-25 MCG/ACT AEPB Inhale 1 puff into the lungs daily. 120 each 1   ipratropium-albuterol  (DUONEB) 0.5-2.5 (3) MG/3ML SOLN Take 3 mLs by nebulization every 4 (four) hours as needed. (Patient taking differently: Take 3 mLs by nebulization as needed (wheezing/SOB).) 360 mL 1   omeprazole (PRILOSEC) 40 MG capsule Take 1 capsule (40 mg total) by mouth daily. 90 capsule 1   potassium chloride  (KLOR-CON ) 10 MEQ tablet TAKE 1 TABLET BY MOUTH 2 TIMES DAILY. 180 tablet 0   amLODipine  (NORVASC ) 10 MG tablet Take 1 tablet (10 mg total) by mouth daily. 30 tablet 2   naproxen  sodium (ALEVE ) 220 MG tablet Take 880 mg by mouth as needed (headache).     Nebivolol  HCl 20 MG TABS Take 1 tablet (20  mg total) by mouth daily. 30 tablet 0   OLANZapine -FLUoxetine  (SYMBYAX ) 6-50 MG capsule Take 1 capsule by mouth at bedtime. 30 capsule 3   rosuvastatin  (CRESTOR ) 20 MG tablet TAKE 1 TABLET BY MOUTH EVERY DAY 90 tablet 0   triamterene -hydrochlorothiazide  (DYAZIDE ) 37.5-25 MG capsule TAKE 1 CAPSULE BY MOUTH DAILY. 90 capsule 0   No facility-administered medications prior to visit.    ROS Review of Systems  Constitutional:  Positive for fatigue and unexpected weight change (wt gain). Negative for appetite change, chills and  diaphoresis.  HENT: Negative.    Eyes: Negative.   Respiratory:  Positive for chest tightness and shortness of breath. Negative for wheezing.   Cardiovascular:  Positive for chest pain. Negative for palpitations and leg swelling.  Gastrointestinal:  Negative for abdominal pain, constipation, diarrhea, nausea and vomiting.  Endocrine: Negative.   Genitourinary: Negative.  Negative for difficulty urinating and dysuria.  Musculoskeletal: Negative.  Negative for arthralgias and myalgias.  Skin: Negative.   Neurological:  Positive for headaches. Negative for dizziness and weakness.  Hematological:  Negative for adenopathy. Does not bruise/bleed easily.  Psychiatric/Behavioral:  Positive for decreased concentration, dysphoric mood and sleep disturbance. Negative for behavioral problems, confusion, self-injury and suicidal ideas. The patient is nervous/anxious.     Objective:  BP (!) 166/104 (BP Location: Left Arm, Patient Position: Sitting, Cuff Size: Large)   Pulse 99   Temp 98.3 F (36.8 C) (Oral)   Resp 16   Ht 5' 8 (1.727 m)   Wt 280 lb 3.2 oz (127.1 kg)   LMP 12/28/2023 (Within Weeks)   SpO2 97%   BMI 42.60 kg/m   BP Readings from Last 3 Encounters:  01/21/24 (!) 166/104  12/26/23 (!) 160/98  11/13/22 (!) 140/90    Wt Readings from Last 3 Encounters:  01/21/24 280 lb 3.2 oz (127.1 kg)  11/13/22 275 lb (124.7 kg)  11/06/22 282 lb (127.9 kg)    Physical Exam Constitutional:      General: She is not in acute distress.    Appearance: She is ill-appearing. She is not toxic-appearing or diaphoretic.  HENT:     Mouth/Throat:     Mouth: Mucous membranes are moist.  Eyes:     General: No scleral icterus.    Conjunctiva/sclera: Conjunctivae normal.  Cardiovascular:     Rate and Rhythm: Normal rate and regular rhythm.     Heart sounds: No murmur heard.    No friction rub. No gallop.     Comments: EKG-- NSR, 88 bpm NS T wave changes Prolonged QT - new No LVH or Q  waves Pulmonary:     Effort: Pulmonary effort is normal.     Breath sounds: No stridor. No wheezing, rhonchi or rales.  Abdominal:     General: Abdomen is flat.     Palpations: There is no mass.     Tenderness: There is no abdominal tenderness. There is no guarding.     Hernia: No hernia is present.  Musculoskeletal:        General: Normal range of motion.     Cervical back: Neck supple.     Right lower leg: No edema.     Left lower leg: No edema.  Lymphadenopathy:     Cervical: No cervical adenopathy.  Skin:    General: Skin is warm and dry.     Findings: No rash.  Neurological:     General: No focal deficit present.     Mental Status: She is alert.  Mental status is at baseline.  Psychiatric:        Attention and Perception: She is inattentive. She does not perceive auditory or visual hallucinations.        Mood and Affect: Mood is anxious and depressed. Affect is flat and tearful.        Speech: Speech is tangential. Speech is not delayed.        Behavior: Behavior normal. Behavior is cooperative.        Thought Content: Thought content normal. Thought content is not paranoid or delusional. Thought content does not include homicidal or suicidal ideation.        Cognition and Memory: Cognition normal.        Judgment: Judgment normal.     Lab Results  Component Value Date   WBC 13.0 (H) 01/21/2024   HGB 9.7 (L) 01/21/2024   HCT 33.0 (L) 01/21/2024   PLT 315.0 01/21/2024   GLUCOSE 164 (H) 01/21/2024   CHOL 266 (H) 01/21/2024   TRIG 150.0 (H) 01/21/2024   HDL 50.50 01/21/2024   LDLCALC 185 (H) 01/21/2024   ALT 7 01/21/2024   AST 13 01/21/2024   NA 141 01/21/2024   K 3.3 (L) 01/21/2024   CL 104 01/21/2024   CREATININE 0.68 01/21/2024   BUN 11 01/21/2024   CO2 29 01/21/2024   TSH 0.50 01/21/2024   INR 0.99 11/28/2017   HGBA1C 7.2 (H) 01/21/2024   MICROALBUR 1.2 01/21/2024   DG Chest Port 1 View Result Date: 11/06/2022 CLINICAL DATA:  Shortness of breath. EXAM:  PORTABLE CHEST 1 VIEW COMPARISON:  October 15, 2019. FINDINGS: The heart size and mediastinal contours are within normal limits. Both lungs are clear. The visualized skeletal structures are unremarkable. IMPRESSION: No active disease. Electronically Signed   By: Lynwood Landy Raddle M.D.   On: 11/06/2022 08:52    Assessment & Plan:  Type 2 diabetes mellitus without complication, without long-term current use of insulin  (HCC) -     Urinalysis, Routine w reflex microscopic; Future -     Hemoglobin A1c; Future -     Microalbumin / creatinine urine ratio; Future -     Basic metabolic panel with GFR; Future -     AMB Referral VBCI Care Management  Hypokalemia -     Basic metabolic panel with GFR; Future -     Aldosterone + renin activity w/ ratio; Future -     Triamterene -HCTZ; Take 1 each (1 capsule total) by mouth daily.  Dispense: 90 capsule; Refill: 0  Iron  deficiency anemia due to chronic blood loss -     CBC with Differential/Platelet; Future -     IBC + Ferritin; Future -     ACCRUFeR ; Take 1 capsule (30 mg total) by mouth in the morning and at bedtime.  Dispense: 180 capsule; Refill: 0 -     AMB Referral VBCI Care Management -     Ambulatory referral to Hematology / Oncology  Screening for colon cancer -     Ambulatory referral to Gastroenterology  Screening mammogram for breast cancer -     3D Screening Mammogram, Left and Right; Future  Encounter for general adult medical examination with abnormal findings- Exam completed, labs reviewed, vaccines reviewed and updated, cancer screenings addressed, pt ed material was given.   Malignant hypertension- She has not achieved her BP goal and is symptomatic. -     Urinalysis, Routine w reflex microscopic; Future -     Hepatic function panel; Future -  EKG 12-Lead -     Nadolol; Take 1 tablet (20 mg total) by mouth daily.  Dispense: 90 tablet; Refill: 0 -     Aldosterone + renin activity w/ ratio; Future -     Triamterene -HCTZ; Take 1 each  (1 capsule total) by mouth daily.  Dispense: 90 capsule; Refill: 0 -     AMB Referral VBCI Care Management  Hyperlipidemia LDL goal <100 -     Lipid panel; Future -     Hepatic function panel; Future -     Rosuvastatin  Calcium ; Take 1 tablet (20 mg total) by mouth daily.  Dispense: 90 tablet; Refill: 0  Class 3 obesity -     TSH; Future  Need for immunization against influenza -     Flu vaccine trivalent PF, 6mos and older(Flulaval,Afluria,Fluarix,Fluzone)  Prolonged QT interval- Will discontinue symbyax  and start nadolol. -     Ambulatory referral to Cardiology -     Troponin I (High Sensitivity); Future -     D-dimer, quantitative; Future -     Nadolol; Take 1 tablet (20 mg total) by mouth daily.  Dispense: 90 tablet; Refill: 0  Cervical cancer screening -     Ambulatory referral to Gynecology  Abnormal EKG -     Troponin I (High Sensitivity); Future -     D-dimer, quantitative; Future  Diuretic-induced hypokalemia  GAD (generalized anxiety disorder) -     AMB Referral VBCI Care Management -     Sertraline  HCl; Take 1 tablet (25 mg total) by mouth daily.  Dispense: 30 tablet; Refill: 0  Leukocytosis, unspecified type -     Ambulatory referral to Hematology / Oncology     Follow-up: Return in about 3 months (around 04/21/2024).  Debby Molt, MD

## 2024-01-22 ENCOUNTER — Encounter: Payer: Self-pay | Admitting: Internal Medicine

## 2024-01-22 ENCOUNTER — Ambulatory Visit: Payer: Self-pay | Admitting: Internal Medicine

## 2024-01-22 DIAGNOSIS — Z1231 Encounter for screening mammogram for malignant neoplasm of breast: Secondary | ICD-10-CM | POA: Insufficient documentation

## 2024-01-22 DIAGNOSIS — Z1211 Encounter for screening for malignant neoplasm of colon: Secondary | ICD-10-CM | POA: Insufficient documentation

## 2024-01-22 DIAGNOSIS — R9431 Abnormal electrocardiogram [ECG] [EKG]: Secondary | ICD-10-CM | POA: Insufficient documentation

## 2024-01-22 MED ORDER — SERTRALINE HCL 25 MG PO TABS: 25.0000 mg | ORAL_TABLET | Freq: Every day | ORAL | 0 refills | Status: DC

## 2024-01-24 ENCOUNTER — Telehealth: Payer: Self-pay | Admitting: *Deleted

## 2024-01-24 NOTE — Progress Notes (Signed)
 Complex Care Management Note  Care Guide Note 01/24/2024 Name: [name] MRN: [id number] DOB: Aug 25, 1975  Jenna Stephens is a 48 y.o. year old female who sees Joshua Debby CROME, MD for primary care. I reached out to Jenna Stephens by phone today to offer complex care management services.  Ms. Sherbert was given information about Complex Care Management services today including:   The Complex Care Management services include support from the care team which includes your Nurse Care Manager, Clinical Social Worker, or Pharmacist.  The Complex Care Management team is here to help remove barriers to the health concerns and goals most important to you. Complex Care Management services are voluntary, and the patient may decline or stop services at any time by request to their care team member.   Complex Care Management Consent Status: Patient agreed to services and verbal consent obtained.   Follow up plan:  Telephone appointment with complex care management team member scheduled for:  02/04/2024 and 02/11/2024  Encounter Outcome:  Patient Scheduled  Thedford Franks, CMA [place]  [facility], [facility] Guide Direct Dial: [phone]  Fax: [phone] Website: [email or web address]

## 2024-01-27 ENCOUNTER — Other Ambulatory Visit: Payer: Self-pay | Admitting: Cardiology

## 2024-01-27 ENCOUNTER — Ambulatory Visit

## 2024-01-27 ENCOUNTER — Ambulatory Visit: Attending: Cardiology | Admitting: Cardiology

## 2024-01-27 VITALS — BP 158/96 | HR 80 | Ht 68.0 in | Wt 279.0 lb

## 2024-01-27 DIAGNOSIS — R0789 Other chest pain: Secondary | ICD-10-CM | POA: Diagnosis not present

## 2024-01-27 DIAGNOSIS — Z79899 Other long term (current) drug therapy: Secondary | ICD-10-CM

## 2024-01-27 DIAGNOSIS — I10 Essential (primary) hypertension: Secondary | ICD-10-CM | POA: Diagnosis not present

## 2024-01-27 DIAGNOSIS — R9431 Abnormal electrocardiogram [ECG] [EKG]: Secondary | ICD-10-CM

## 2024-01-27 DIAGNOSIS — E7800 Pure hypercholesterolemia, unspecified: Secondary | ICD-10-CM

## 2024-01-27 DIAGNOSIS — E876 Hypokalemia: Secondary | ICD-10-CM

## 2024-01-27 NOTE — Patient Instructions (Signed)
 Medication Instructions:  The current medical regimen is effective;  continue present plan and medications.  *If you need a refill on your cardiac medications before your next appointment, please call your pharmacy*  Lab Work: Please have blood work today (BMP, Mg)  If you have labs (blood work) drawn today and your tests are completely normal, you will receive your results only by: MyChart Message (if you have MyChart) OR A paper copy in the mail If you have any lab test that is abnormal or we need to change your treatment, we will call you to review the results.  Testing/Procedures: Your physician has requested that you have an echocardiogram. Echocardiography is a painless test that uses sound waves to create images of your heart. It provides your doctor with information about the size and shape of your heart and how well your heart's chambers and valves are working. This procedure takes approximately one hour. There are no restrictions for this procedure. Please do NOT wear cologne, perfume, aftershave, or lotions (deodorant is allowed). Please arrive 15 minutes prior to your appointment time.  Please note: We ask at that you not bring children with you during ultrasound (echo/ vascular) testing. Due to room size and safety concerns, children are not allowed in the ultrasound rooms during exams. Our front office staff cannot provide observation of children in our lobby area while testing is being conducted. An adult accompanying a patient to their appointment will only be allowed in the ultrasound room at the discretion of the ultrasound technician under special circumstances. We apologize for any inconvenience.  Your physician has requested that you have an exercise tolerance test.  This testing is completed here at [address] [facility].  Instructions are nothing to eat/drink 3 hours before except water .  OK to take medications.  Please wear 2 piece comfortable clothing and walking shoes.  ZIO  XT- Long Term Monitor Instructions  Your physician has requested you wear a ZIO patch monitor for 14 days.  This is a single patch monitor. [employer] supplies one patch monitor per enrollment. Additional stickers are not available. Please do not apply patch if you will be having a Nuclear Stress Test,  Echocardiogram, Cardiac CT, MRI, or Chest Xray during the period you would be wearing the  monitor. The patch cannot be worn during these tests. You cannot remove and re-apply the  ZIO XT patch monitor.  Your ZIO patch monitor will be mailed 3 day USPS to your address on file. It may take 3-5 days  to receive your monitor after you have been enrolled.  Once you have received your monitor, please review the enclosed instructions. Your monitor  has already been registered assigning a specific monitor serial # to you.  Billing and Patient Assistance Program Information  We have supplied [employer] with any of your insurance information on file for billing purposes. [employer] offers a sliding scale Patient Assistance Program for patients that do not have  insurance, or whose insurance does not completely cover the cost of the ZIO monitor.  You must apply for the Patient Assistance Program to qualify for this discounted rate.  To apply, please call [employer] at [phone], select option 4, select option 2, ask to apply for  Patient Assistance Program. Meredeth will ask your household income, and how many people  are in your household. They will quote your out-of-pocket cost based on that information.  [employer] will also be able to set up a 37-month, interest-free payment plan if needed.  Applying  the monitor   Shave hair from upper left chest.  Hold abrader disc by orange tab. Rub abrader in 40 strokes over the upper left chest as  indicated in your monitor instructions.  Clean area with 4 enclosed alcohol pads. Let dry.  Apply patch as indicated in monitor instructions. Patch will be placed under  collarbone on left  side of chest with arrow pointing upward.  Rub patch adhesive wings for 2 minutes. Remove white label marked 1. Remove the white  label marked 2. Rub patch adhesive wings for 2 additional minutes.  While looking in a mirror, press and release button in center of patch. A small green light will  flash 3-4 times. This will be your only indicator that the monitor has been turned on.  Do not shower for the first 24 hours. You may shower after the first 24 hours.  Press the button if you feel a symptom. You will hear a small click. Record Date, Time and  Symptom in the Patient Logbook.  When you are ready to remove the patch, follow instructions on the last 2 pages of Patient  Logbook. Stick patch monitor onto the last page of Patient Logbook.  Place Patient Logbook in the blue and white box. Use locking tab on box and tape box closed  securely. The blue and white box has prepaid postage on it. Please place it in the mailbox as  soon as possible. Your physician should have your test results approximately 7 days after the  monitor has been mailed back to [facility].  Call [facility] Customer Care at [phone] if you have questions regarding  your ZIO XT patch monitor. Call them immediately if you see an orange light blinking on your  monitor.  If your monitor falls off in less than 4 days, contact our Monitor department at [phone].  If your monitor becomes loose or falls off after 4 days call [employer] at [phone] for  suggestions on securing your monitor   Follow-Up: At [facility], you and your health needs are our priority.  As part of our continuing mission to provide you with exceptional heart care, our providers are all part of one team.  This team includes your primary Cardiologist (physician) and Advanced Practice Providers or APPs (Physician Assistants and Nurse Practitioners) who all work together to provide you with the care you  need, when you need it.  Your next appointment:   3 month(s)  Provider:   Madonna Large, DO    We recommend signing up for the patient portal called MyChart.  Sign up information is provided on this After Visit Summary.  MyChart is used to connect with patients for Virtual Visits (Telemedicine).  Patients are able to view lab/test results, encounter notes, upcoming appointments, etc.  Non-urgent messages can be sent to your provider as well.   To learn more about what you can do with MyChart, go to [email or web address].

## 2024-01-27 NOTE — Progress Notes (Signed)
 Cardiology Office Note:    NAME:  Jenna Stephens    MRN: [id number] DOB:  07/26/1975   PCP:  Joshua Debby CROME, MD  Former Cardiology Providers: None  Primary Cardiologist:  Madonna Large, DO, [facility] (established care 01/27/2024) Electrophysiologist:  None   Referring MD: Joshua Debby CROME, MD  Reason of Consult: Prolonged QT  Chief Complaint  Patient presents with   New Patient (Initial Visit)    Prolong QT     History of Present Illness:    Jenna Stephens is a 48 y.o. African-American female whose past medical history and cardiovascular risk factors includes: Hypertension, hyperlipidemia, hypokalemia, asthma, anemia, smoking, marijuana. She is being seen today for the evaluation of prolonged QT at the request of Joshua Debby CROME, MD.  Chest tightness:  Since May 2025 Substernal  Stress related  5/10 Intermittent  Duration: Minutes  Worse with stressors  No improving factors  Not with walking or exertion.   Two weeks ago was walking on treadmill - 30 minutes and no issues.   No first degree relatives with premature coronary disease or sudden cardiac death.  Current Medications: Current Meds  Medication Sig   albuterol  (VENTOLIN  HFA) 108 (90 Base) MCG/ACT inhaler Inhale 1-2 puffs into the lungs every 6 (six) hours as needed for wheezing or shortness of breath. (Patient taking differently: Inhale 2 puffs into the lungs as needed for wheezing or shortness of breath.)   ALPRAZolam  (XANAX ) 1 MG tablet Take 1 tablet (1 mg total) by mouth 2 (two) times daily as needed for anxiety.   [employer] ASPIRIN  LOW DOSE 81 MG tablet TAKE 1 TABLET (81 MG TOTAL) BY MOUTH DAILY. SWALLOW WHOLE.   Ferric Maltol  (ACCRUFER ) 30 MG CAPS Take 1 capsule (30 mg total) by mouth in the morning and at bedtime.   Fluticasone -Umeclidin-Vilant (TRELEGY ELLIPTA ) 100-62.5-25 MCG/ACT AEPB Inhale 1 puff into the lungs daily.   ipratropium-albuterol  (DUONEB) 0.5-2.5 (3) MG/3ML SOLN Take 3 mLs by nebulization every 4  (four) hours as needed. (Patient taking differently: Take 3 mLs by nebulization as needed (wheezing/SOB).)   nadolol (CORGARD) 20 MG tablet Take 1 tablet (20 mg total) by mouth daily.   omeprazole (PRILOSEC) 40 MG capsule Take 1 capsule (40 mg total) by mouth daily.   potassium chloride  (KLOR-CON ) 10 MEQ tablet TAKE 1 TABLET BY MOUTH 2 TIMES DAILY.   rosuvastatin  (CRESTOR ) 20 MG tablet Take 1 tablet (20 mg total) by mouth daily.   sertraline  (ZOLOFT ) 25 MG tablet Take 1 tablet (25 mg total) by mouth daily.   triamterene -hydrochlorothiazide  (DYAZIDE ) 37.5-25 MG capsule Take 1 each (1 capsule total) by mouth daily.     Allergies:    Patient has no known allergies.   Past Medical History: Past Medical History:  Diagnosis Date   Acute respiratory failure (HCC) 12/29/2017   Anxiety    Asthma    Gestational diabetes    Hypertension    Iron  deficiency anemia    Obesity     Past Surgical History: Past Surgical History:  Procedure Laterality Date   TONSILLECTOMY     TUBAL LIGATION N/A 10/07/2018   Procedure: POST PARTUM TUBAL LIGATION;  Surgeon: Barbra Lang PARAS, DO;  Location: [facility] LD ORS;  Service: Gynecology;  Laterality: N/A;    Social History: Social History   Tobacco Use   Smoking status: Some Days    Current packs/day: 0.00    Types: Cigarettes    Last attempt to quit: 01/06/2018    Years  since quitting: 6.0    Passive exposure: Past   Smokeless tobacco: Never  Vaping Use   Vaping status: Never Used  Substance Use Topics   Alcohol use: Yes    Alcohol/week: 2.0 standard drinks of alcohol    Types: 2 Standard drinks or equivalent per week   Drug use: Not Currently    Types: Marijuana    Family History: Family History  Problem Relation Age of Onset   Diabetes Mother    Hypertension Mother    Hypertension Father    COPD Father     ROS:   Review of Systems  Cardiovascular:  Positive for chest pain (see HPI). Negative for claudication, irregular heartbeat, leg  swelling, near-syncope, orthopnea, palpitations, paroxysmal nocturnal dyspnea and syncope.  Respiratory:  Negative for shortness of breath.   Hematologic/Lymphatic: Negative for bleeding problem.    EKGs/Labs/Other Studies Reviewed:   EKG: 11/06/2022: Sinus rhythm, 93 bpm, QTc 471 ms, without underlying ischemia or injury pattern  January 21, 2024: Sinus rhythm, 88 bpm, QTc 476 ms, PR 148 ms, without underlying ischemia or injury pattern  Echocardiogram: 2019: LVEF 55 to 60%, basal septal hypertrophy, grade 1 diastolic dysfunction   Labs:    Latest Ref Rng & Units 01/21/2024    3:09 PM 11/13/2022    3:36 PM 11/07/2022    4:48 AM  CBC  WBC 4.0 - 10.5 K/uL 13.0  11.7  23.7   Hemoglobin 12.0 - 15.0 g/dL 9.7  89.5  89.1   Hematocrit 36.0 - 46.0 % 33.0  35.5  35.6   Platelets 150.0 - 400.0 K/uL 315.0  307.0  337        Latest Ref Rng & Units 01/27/2024    4:28 PM 01/21/2024    3:09 PM 11/13/2022    3:36 PM  BMP  Glucose 70 - 99 mg/dL 881  835  886   BUN 6 - 24 mg/dL 14  11  14    Creatinine 0.57 - 1.00 mg/dL 9.29  9.31  9.22   BUN/Creat Ratio 9 - 23 20     Sodium 134 - 144 mmol/L 139  141  138   Potassium 3.5 - 5.2 mmol/L 3.7  3.3  3.1   Chloride 96 - 106 mmol/L 101  104  101   CO2 20 - 29 mmol/L 24  29  31    Calcium  8.7 - 10.2 mg/dL 9.1  8.9  9.0       Latest Ref Rng & Units 01/27/2024    4:28 PM 01/21/2024    3:09 PM 11/13/2022    3:36 PM  CMP  Glucose 70 - 99 mg/dL 881  835  886   BUN 6 - 24 mg/dL 14  11  14    Creatinine 0.57 - 1.00 mg/dL 9.29  9.31  9.22   Sodium 134 - 144 mmol/L 139  141  138   Potassium 3.5 - 5.2 mmol/L 3.7  3.3  3.1   Chloride 96 - 106 mmol/L 101  104  101   CO2 20 - 29 mmol/L 24  29  31    Calcium  8.7 - 10.2 mg/dL 9.1  8.9  9.0   Total Protein 6.0 - 8.3 g/dL  7.1    Total Bilirubin 0.2 - 1.2 mg/dL  0.4    Alkaline Phos 39 - 117 U/L  69    AST 0 - 37 U/L  13    ALT 0 - 35 U/L  7  Lab Results  Component Value Date   CHOL 266 (H)  01/21/2024   HDL 50.50 01/21/2024   LDLCALC 185 (H) 01/21/2024   TRIG 150.0 (H) 01/21/2024   CHOLHDL 5 01/21/2024   No results for input(s): LIPOA in the last 8760 hours. No components found for: NTPROBNP No results for input(s): PROBNP in the last 8760 hours. Recent Labs    01/21/24 1509  TSH 0.50    Physical Exam:    Today's Vitals   01/27/24 1424  BP: (!) 158/96  Pulse: 80  SpO2: 96%  Weight: 279 lb (126.6 kg)  Height: 5' 8 (1.727 m)   Body mass index is 42.42 kg/m. Wt Readings from Last 3 Encounters:  01/27/24 279 lb (126.6 kg)  01/21/24 280 lb 3.2 oz (127.1 kg)  11/13/22 275 lb (124.7 kg)    Physical Exam  Constitutional: No distress.  hemodynamically stable  Neck: No JVD present.  Cardiovascular: Normal rate, regular rhythm, S1 normal and S2 normal. Exam reveals no gallop, no S3 and no S4.  No murmur heard. Pulmonary/Chest: Effort normal and breath sounds normal. No stridor. She has no wheezes. She has no rales.  Musculoskeletal:        General: No edema.     Cervical back: Neck supple.  Skin: Skin is warm.     Impression & Recommendation(s):  Impression:   ICD-10-CM   1. Prolonged QT interval  R94.31 Magnesium     Basic metabolic panel with GFR    EXERCISE TOLERANCE TEST (ETT)    Cardiac Stress Test: Informed Consent Details: Physician/Practitioner Attestation; Transcribe to consent form and obtain patient signature    CANCELED: LONG TERM MONITOR (3-14 DAYS)    2. Hypokalemia  E87.6 Basic metabolic panel with GFR    3. Chest tightness  R07.89 ECHOCARDIOGRAM COMPLETE    EXERCISE TOLERANCE TEST (ETT)    Cardiac Stress Test: Informed Consent Details: Physician/Practitioner Attestation; Transcribe to consent form and obtain patient signature    4. Benign hypertension  I10 Basic metabolic panel with GFR    5. Pure hypercholesterolemia  E78.00     6. Medication management  Z79.899 Magnesium     Basic metabolic panel with GFR        Recommendation(s):  Prolonged QT interval EKGs from July and September 2025 reviewed-  rhythm sinus with borderline prolongation of the QT segment. Electrolyte abnormalities:Hypokalemia - on supplement  Family history: None Medication review: Zoloft  (new - sept 2025), Xanax  (12/2023), Dyazide  (over a year) Congenital heart disease history:None History of syncope:None Already on nadolol 20 mg p.o. daily. Started by PCP on 01/21/2024 Echo will be ordered to evaluate for structural heart disease and left ventricular systolic function. Exercise treadmill stress test  Hypokalemia Currently on potassium supplementation. May be exacerbated by triamterene /hydrochlorothiazide , asked the patient to discuss this further with PCP Will recheck electrolytes  Chest tightness Symptoms suggestive of both cardiac and noncardiac features. Echo will be ordered to evaluate for structural heart disease and left ventricular systolic function. Exercise treadmill stress test  Benign hypertension Office blood pressures were not well-controlled. Current medical therapy reviewed. Patient is recommended to start checking blood pressures at home and to review with PCP to see if additional medication titration is warranted. Reemphasized importance of low-salt diet.  Pure hypercholesterolemia Continue Crestor  20 mg p.o. nightly  Orders Placed:  Orders Placed This Encounter  Procedures   Magnesium    Basic metabolic panel with GFR   Cardiac Stress Test: Informed Consent Details: Physician/Practitioner Attestation; Transcribe to  consent form and obtain patient signature    Physician/Practitioner attestation of informed consent for procedure/surgical case:   I, the physician/practitioner, attest that I have discussed with the patient the benefits, risks, side effects, alternatives, likelihood of achieving goals and potential problems during recovery for the procedure that I have provided informed consent.     Procedure:   GXT    Indication/Reason:   Precordial pain   EXERCISE TOLERANCE TEST (ETT)    Standing Status:   Future    Expected Date:   02/03/2024    Expiration Date:   07/27/2024    Where should this test be performed:   [facility] [facility]    Stress with pharmacologic or treadmill ?:   Treadmill w/ exercise    Is patient able to ambulate on a treadmill?:   Yes   ECHOCARDIOGRAM COMPLETE    Standing Status:   Future    Expected Date:   02/03/2024    Expiration Date:   01/26/2025    [facility]    Does the patient weigh less than or greater than 250 lbs?:   Patient weighs less than 250 lbs    Perflutren DEFINITY (image enhancing agent) should be administered unless hypersensitivity or allergy exist:   Administer Perflutren    Reason for exam-Echo:   Chest Pain  R07.9     Final Medication List:   No orders of the defined types were placed in this encounter.   There are no discontinued medications.   Current Outpatient Medications:    albuterol  (VENTOLIN  HFA) 108 (90 Base) MCG/ACT inhaler, Inhale 1-2 puffs into the lungs every 6 (six) hours as needed for wheezing or shortness of breath. (Patient taking differently: Inhale 2 puffs into the lungs as needed for wheezing or shortness of breath.), Disp: 8 g, Rfl: 0   ALPRAZolam  (XANAX ) 1 MG tablet, Take 1 tablet (1 mg total) by mouth 2 (two) times daily as needed for anxiety., Disp: 60 tablet, Rfl: 1   [employer] ASPIRIN  LOW DOSE 81 MG tablet, TAKE 1 TABLET (81 MG TOTAL) BY MOUTH DAILY. SWALLOW WHOLE., Disp: 90 tablet, Rfl: 1   Ferric Maltol  (ACCRUFER ) 30 MG CAPS, Take 1 capsule (30 mg total) by mouth in the morning and at bedtime., Disp: 180 capsule, Rfl: 0   Fluticasone -Umeclidin-Vilant (TRELEGY ELLIPTA ) 100-62.5-25 MCG/ACT AEPB, Inhale 1 puff into the lungs daily., Disp: 120 each, Rfl: 1   ipratropium-albuterol  (DUONEB) 0.5-2.5 (3) MG/3ML SOLN, Take 3 mLs by nebulization every 4 (four) hours as  needed. (Patient taking differently: Take 3 mLs by nebulization as needed (wheezing/SOB).), Disp: 360 mL, Rfl: 1   nadolol (CORGARD) 20 MG tablet, Take 1 tablet (20 mg total) by mouth daily., Disp: 90 tablet, Rfl: 0   omeprazole (PRILOSEC) 40 MG capsule, Take 1 capsule (40 mg total) by mouth daily., Disp: 90 capsule, Rfl: 1   potassium chloride  (KLOR-CON ) 10 MEQ tablet, TAKE 1 TABLET BY MOUTH 2 TIMES DAILY., Disp: 180 tablet, Rfl: 0   rosuvastatin  (CRESTOR ) 20 MG tablet, Take 1 tablet (20 mg total) by mouth daily., Disp: 90 tablet, Rfl: 0   sertraline  (ZOLOFT ) 25 MG tablet, Take 1 tablet (25 mg total) by mouth daily., Disp: 30 tablet, Rfl: 0   triamterene -hydrochlorothiazide  (DYAZIDE ) 37.5-25 MG capsule, Take 1 each (1 capsule total) by mouth daily., Disp: 90 capsule, Rfl: 0  Consent:   Informed Consent   Shared Decision Making/Informed Consent The risks [chest pain,  shortness of breath, cardiac arrhythmias, dizziness, blood pressure fluctuations, myocardial infarction, stroke/transient ischemic attack, and life-threatening complications (estimated to be 1 in 10,000)], benefits (risk stratification, diagnosing coronary artery disease, treatment guidance) and alternatives of an exercise tolerance test were discussed in detail with Ms. Halbur and she agrees to proceed.     Disposition:   49-month follow-up sooner if needed Patient may be asked to follow-up sooner based on the results of the above-mentioned testing.  Her questions and concerns were addressed to her satisfaction. She voices understanding of the recommendations provided during this encounter.    Signed, Madonna Michele HAS, [facility]  A Division of [facility] [address], [place],  [address]

## 2024-01-27 NOTE — Progress Notes (Unsigned)
 Enrolled for [employer] to mail a ZIO XT long term holter monitor to the patients address on file.

## 2024-01-28 ENCOUNTER — Ambulatory Visit: Payer: Self-pay

## 2024-01-28 LAB — BASIC METABOLIC PANEL WITH GFR
BUN/Creatinine Ratio: 20 (ref 9–23)
BUN: 14 mg/dL (ref 6–24)
CO2: 24 mmol/L (ref 20–29)
Calcium: 9.1 mg/dL (ref 8.7–10.2)
Chloride: 101 mmol/L (ref 96–106)
Creatinine, Ser: 0.7 mg/dL (ref 0.57–1.00)
Glucose: 118 mg/dL — AB (ref 70–99)
Potassium: 3.7 mmol/L (ref 3.5–5.2)
Sodium: 139 mmol/L (ref 134–144)
eGFR: 108 mL/min/1.73 (ref >59)

## 2024-01-28 LAB — MAGNESIUM: Magnesium: 2 mg/dL (ref 1.6–2.3)

## 2024-02-01 ENCOUNTER — Other Ambulatory Visit: Payer: Self-pay | Admitting: Internal Medicine

## 2024-02-01 DIAGNOSIS — I160 Hypertensive urgency: Secondary | ICD-10-CM

## 2024-02-02 ENCOUNTER — Encounter: Payer: Self-pay | Admitting: Cardiology

## 2024-02-04 ENCOUNTER — Other Ambulatory Visit

## 2024-02-05 NOTE — Telephone Encounter (Signed)
 Copied from CRM [id number]. Topic: General - Call Back - No Documentation >> Feb 05, 2024  2:41 PM Harlene ORN wrote: Reason for CRM: Patient got a call from the nurse about questions from Nurse [place]. Reached out to the Practice, was unable to get in touch with the nurse, so will send a message.

## 2024-02-06 ENCOUNTER — Telehealth: Payer: Self-pay | Admitting: *Deleted

## 2024-02-06 NOTE — Progress Notes (Signed)
 Complex Care Management Care Guide Note  02/06/2024 Name: Jenna Stephens MRN: [id number] DOB: [birth year]  Jenna Stephens is a 48 y.o. year old female who is a primary care patient of Joshua Debby CROME, MD and is actively engaged with the care management team. I reached out to Jenna Stephens by phone today to assist with re-scheduling  with the Pharmacist.  Follow up plan: Unsuccessful telephone outreach attempt made. A HIPAA compliant phone message was left for the patient providing contact information and requesting a return call.  Thedford Franks, CMA [place]  [facility], [facility] Guide Direct Dial: [phone]  Fax: [phone] Website: [email or web address]

## 2024-02-07 NOTE — Progress Notes (Signed)
 Complex Care Management Care Guide Note  02/07/2024 Name: Jenna Stephens MRN: [id number] DOB: [birth year]  Jenna Stephens is a 48 y.o. year old female who is a primary care patient of Joshua Debby CROME, MD and is actively engaged with the care management team. I reached out to Jenna Stephens by phone today to assist with re-scheduling  with the Pharmacist.  Follow up plan: Unsuccessful telephone outreach attempt made. A HIPAA compliant phone message was left for the patient providing contact information and requesting a return call. No additional outreach will be made due to inability to maintain patient contact.  [facility]  [facility], [facility] Guide  Direct Dial: [phone]  Fax [phone]

## 2024-02-11 ENCOUNTER — Telehealth: Payer: Self-pay | Admitting: *Deleted

## 2024-02-11 ENCOUNTER — Encounter: Payer: Self-pay | Admitting: *Deleted

## 2024-02-11 NOTE — Patient Outreach (Signed)
 Phone call from patient, initial assessment appointment re-scheduled for 03/03/24 at 11:15am.   Lenn Mean, LCSW [place]  [facility], [facility] Licensed Clinical Social Worker  Direct Dial: [phone]

## 2024-02-14 ENCOUNTER — Other Ambulatory Visit: Payer: Self-pay | Admitting: Internal Medicine

## 2024-02-14 DIAGNOSIS — F411 Generalized anxiety disorder: Secondary | ICD-10-CM

## 2024-02-15 LAB — ALDOSTERONE + RENIN ACTIVITY W/ RATIO
ALDO / PRA Ratio: 2.9 ratio (ref 0.9–28.9)
Aldosterone: 2 ng/dL
Renin Activity: 0.69 ng/mL/h (ref 0.25–5.82)

## 2024-02-15 LAB — D-DIMER, QUANTITATIVE: D-Dimer, Quant: <0.19 ug{FEU}/mL (ref <0.50)

## 2024-02-22 ENCOUNTER — Other Ambulatory Visit: Payer: Self-pay | Admitting: Internal Medicine

## 2024-02-22 DIAGNOSIS — F411 Generalized anxiety disorder: Secondary | ICD-10-CM

## 2024-02-22 MED ORDER — SERTRALINE HCL 50 MG PO TABS: 50.0000 mg | ORAL_TABLET | Freq: Every day | ORAL | 0 refills | Status: DC

## 2024-02-24 ENCOUNTER — Other Ambulatory Visit: Payer: Self-pay | Admitting: Hematology and Oncology

## 2024-02-24 DIAGNOSIS — D500 Iron deficiency anemia secondary to blood loss (chronic): Secondary | ICD-10-CM

## 2024-02-24 NOTE — Progress Notes (Signed)
 [facility] [phone]   [phone]  PROGRESS NOTE  Patient Care Team: Joshua Debby CROME, MD as PCP - General (Internal Medicine) Michele Richardson, DO as PCP - Cardiology (Cardiology) Andria Pipe, MD  Hematological/Oncological History # Iron  Deficiency Anemia 2/2 to GYN Bleeding 09/13/2021: WBC 11.1, Hgb 10.5, Plt 350, MCV 68.6, TIBC 543, Ferritin 2.9, Sat ratio 3.9%.   10/02/2021: establish care with Dr. Federico  10/30/2021: IV monoferric  1000 mg x 1 dose  11/27/2021: WBC 8.9, Hgb 11.1, MCV 72.2, and Plt 312  Interval History:  Jenna Stephens 48 y.o. female with medical history significant for iron  deficiency anemia secondary to GYN bleeding who presents for a follow up visit. The patient's last visit was on 11/27/2021. In the interim since the last visit she was lost to follow up   On exam today Jenna Stephens reports she has been well overall since her last visit over 2 years ago.  She reports that she does have blood pressure medications and did take them this morning as prescribed.  She reports that she has been having some issues with headache, particular on the sides of her head.  She reports that she has not had any issues with numbness or tingling of her fingers and toes.  She reports she is also not having any lightheadedness, dizziness, shortness of breath.  She reports her energy today is about a 0 out of 10.  She does feel like her chest is tight.  She reports that she continues to have menstrual cycles which are sporadic.  Her last heavy cycle was in September 2025.  She reports that she has had no bleeding elsewhere such as nosebleeds, gum bleeding, or blood in the urine/stool.  She reports he does her best to try to eat iron  rich food including red meat.  She notes that she is not taking iron  pills.  Overall she feels poorly today and denies any fevers, chills, sweats.  Full 10 point ROS is otherwise negative.  Due to her elevated blood pressure we sent  her to the emergency department due to concern for hypertensive urgency.  She was transported directly to emergency department after our visit.  MEDICAL HISTORY:  Past Medical History:  Diagnosis Date   Acute respiratory failure (HCC) 12/29/2017   Anxiety    Asthma    Gestational diabetes    Hypertension    Iron  deficiency anemia    Obesity     SURGICAL HISTORY: Past Surgical History:  Procedure Laterality Date   TONSILLECTOMY     TUBAL LIGATION N/A 10/07/2018   Procedure: POST PARTUM TUBAL LIGATION;  Surgeon: Barbra Lang PARAS, DO;  Location: [facility] LD ORS;  Service: Gynecology;  Laterality: N/A;    SOCIAL HISTORY: Social History   Socioeconomic History   Marital status: Married    Spouse name: Not on file   Number of children: Not on file   Years of education: Not on file   Highest education level: Not on file  Occupational History   Not on file  Tobacco Use   Smoking status: Some Days    Current packs/day: 0.00    Types: Cigarettes    Last attempt to quit: 01/06/2018    Years since quitting: 6.1    Passive exposure: Past   Smokeless tobacco: Never  Vaping Use   Vaping status: Never Used  Substance and Sexual Activity   Alcohol use: Yes    Alcohol/week: 2.0 standard drinks of alcohol  Types: 2 Standard drinks or equivalent per week   Drug use: Not Currently    Types: Marijuana   Sexual activity: Yes    Partners: Male    Birth control/protection: Surgical  Other Topics Concern   Not on file  Social History Narrative   ** Merged History Encounter **       Social Drivers of Health   Financial Resource Strain: Not on file  Food Insecurity: No Food Insecurity (11/07/2022)   Hunger Vital Sign    Worried About Running Out of Food in the Last Year: Never true    Ran Out of Food in the Last Year: Never true  Transportation Needs: No Transportation Needs (11/07/2022)   PRAPARE - Administrator, Civil Service (Medical): No    Lack of Transportation  (Non-Medical): No  Physical Activity: Not on file  Stress: Not on file  Social Connections: Not on file  Intimate Partner Violence: Not At Risk (11/07/2022)   Humiliation, Afraid, Rape, and Kick questionnaire    Fear of Current or Ex-Partner: No    Emotionally Abused: No    Physically Abused: No    Sexually Abused: No    FAMILY HISTORY: Family History  Problem Relation Age of Onset   Diabetes Mother    Hypertension Mother    Hypertension Father    COPD Father     ALLERGIES:  has no known allergies.  MEDICATIONS:  Current Outpatient Medications  Medication Sig Dispense Refill   albuterol  (VENTOLIN  HFA) 108 (90 Base) MCG/ACT inhaler Inhale 1-2 puffs into the lungs every 6 (six) hours as needed for wheezing or shortness of breath. (Patient taking differently: Inhale 2 puffs into the lungs as needed for wheezing or shortness of breath.) 8 g 0   ALPRAZolam  (XANAX ) 1 MG tablet Take 1 tablet (1 mg total) by mouth 2 (two) times daily as needed for anxiety. 60 tablet 1   [facility] ASPIRIN  LOW DOSE 81 MG tablet TAKE 1 TABLET (81 MG TOTAL) BY MOUTH DAILY. SWALLOW WHOLE. 90 tablet 1   Ferric Maltol  (ACCRUFER ) 30 MG CAPS Take 1 capsule (30 mg total) by mouth in the morning and at bedtime. 180 capsule 0   Fluticasone -Umeclidin-Vilant (TRELEGY ELLIPTA ) 100-62.5-25 MCG/ACT AEPB Inhale 1 puff into the lungs daily. 120 each 1   ipratropium-albuterol  (DUONEB) 0.5-2.5 (3) MG/3ML SOLN Take 3 mLs by nebulization every 4 (four) hours as needed. (Patient taking differently: Take 3 mLs by nebulization as needed (wheezing/SOB).) 360 mL 1   metoCLOPramide  (REGLAN ) 5 MG tablet Take 1 tablet (5 mg total) by mouth 3 (three) times daily as needed for up to 8 doses (headache). 8 tablet 0   nadolol (CORGARD) 20 MG tablet Take 1 tablet (20 mg total) by mouth daily. 90 tablet 0   omeprazole (PRILOSEC) 40 MG capsule Take 1 capsule (40 mg total) by mouth daily. 90 capsule 1   potassium chloride  (KLOR-CON ) 10 MEQ tablet TAKE  1 TABLET BY MOUTH 2 TIMES DAILY. 180 tablet 0   rosuvastatin  (CRESTOR ) 20 MG tablet Take 1 tablet (20 mg total) by mouth daily. 90 tablet 0   sertraline  (ZOLOFT ) 50 MG tablet Take 1 tablet (50 mg total) by mouth daily. 90 tablet 0   triamterene -hydrochlorothiazide  (DYAZIDE ) 37.5-25 MG capsule Take 1 each (1 capsule total) by mouth daily. 90 capsule 0   No current facility-administered medications for this visit.    REVIEW OF SYSTEMS:   Constitutional: ( - ) fevers, ( - )  chills , ( - )  night sweats Eyes: ( - ) blurriness of vision, ( - ) double vision, ( - ) watery eyes Ears, nose, mouth, throat, and face: ( - ) mucositis, ( - ) sore throat Respiratory: ( - ) cough, ( - ) dyspnea, ( - ) wheezes Cardiovascular: ( - ) palpitation, ( - ) chest discomfort, ( - ) lower extremity swelling Gastrointestinal:  ( - ) nausea, ( - ) heartburn, ( - ) change in bowel habits Skin: ( - ) abnormal skin rashes Lymphatics: ( - ) new lymphadenopathy, ( - ) easy bruising Neurological: ( - ) numbness, ( - ) tingling, ( - ) new weaknesses Behavioral/Psych: ( - ) mood change, ( - ) new changes  All other systems were reviewed with the patient and are negative.  PHYSICAL EXAMINATION:  Vitals:   02/25/24 1043 02/25/24 1044  BP: (!) 184/105 (!) 184/122  Pulse: 72   Resp: 14   Temp: 97.9 F (36.6 C)   SpO2: 100%     Filed Weights   02/25/24 1043  Weight: 282 lb 1.9 oz (128 kg)     GENERAL: Well-appearing middle-aged African-American female, alert, no distress and comfortable SKIN: skin color, texture, turgor are normal, no rashes or significant lesions EYES: conjunctiva are pink and non-injected, sclera clear OROPHARYNX: no exudate, no erythema; lips, buccal mucosa, and tongue normal  NECK: supple, non-tender LYMPH:  no palpable lymphadenopathy in the cervical, axillary or inguinal LUNGS: clear to auscultation and percussion with normal breathing effort HEART: regular rate & rhythm and no murmurs  and no lower extremity edema ABDOMEN: soft, non-tender, non-distended, normal bowel sounds Musculoskeletal: no cyanosis of digits and no clubbing  PSYCH: alert & oriented x 3, fluent speech NEURO: no focal motor/sensory deficits  LABORATORY DATA:  I have reviewed the data as listed    Latest Ref Rng & Units 02/25/2024   12:06 PM 02/25/2024    9:37 AM 01/21/2024    3:09 PM  CBC  WBC 4.0 - 10.5 K/uL 10.4  10.0  13.0   Hemoglobin 12.0 - 15.0 g/dL 89.9  89.7  9.7   Hematocrit 36.0 - 46.0 % 36.3  35.3  33.0   Platelets 150 - 400 K/uL 341  344  315.0        Latest Ref Rng & Units 02/25/2024   12:06 PM 02/25/2024    9:37 AM 01/27/2024    4:28 PM  CMP  Glucose 70 - 99 mg/dL 860  844  881   BUN 6 - 20 mg/dL 9  10  14    Creatinine 0.44 - 1.00 mg/dL 9.36  9.25  9.29   Sodium 135 - 145 mmol/L 140  141  139   Potassium 3.5 - 5.1 mmol/L 3.4  3.5  3.7   Chloride 98 - 111 mmol/L 103  105  101   CO2 22 - 32 mmol/L 25  30  24    Calcium  8.9 - 10.3 mg/dL 9.1  9.0  9.1   Total Protein 6.5 - 8.1 g/dL 7.8  7.3    Total Bilirubin 0.0 - 1.2 mg/dL 0.4  0.4    Alkaline Phos 38 - 126 U/L 86  77    AST 15 - 41 U/L 15  11    ALT 0 - 44 U/L 9  9      RADIOGRAPHIC STUDIES: MR BRAIN WO CONTRAST Result Date: 02/25/2024 CLINICAL DATA:  Headache, left-sided tingling and facial droop EXAM: MRI HEAD WITHOUT CONTRAST TECHNIQUE: Multiplanar, multiecho  pulse sequences of the brain and surrounding structures were obtained without intravenous contrast. COMPARISON:  None Available. FINDINGS: MRI brain: The brain volume is normal. There are a few small foci of T2 hyperintensity in the cerebral white matter. These do not have restricted diffusion. There is no acute or chronic infarct. The ventricles are normal. No mass lesion. There are normal flow signals in the carotid arteries and basilar artery. No significant bone marrow signal abnormality. No significant abnormality in the paranasal sinuses or soft tissues. IMPRESSION:  No acute infarct or other significant abnormality Electronically Signed   By: Nancyann Burns M.D.   On: 02/25/2024 14:39   CT Head Wo Contrast Result Date: 02/25/2024 EXAM: CT HEAD WITHOUT CONTRAST 02/25/2024 12:49:11 PM TECHNIQUE: CT of the head was performed without the administration of intravenous contrast. Automated exposure control, iterative reconstruction, and/or weight based adjustment of the mA/kV was utilized to reduce the radiation dose to as low as reasonably achievable. COMPARISON: Head CT 11/29/2007. CLINICAL HISTORY: 48 year old female. Headache, increasing frequency or severity. FINDINGS: BRAIN AND VENTRICLES: No acute hemorrhage. No evidence of acute infarct. No hydrocephalus. No extra-axial collection. No mass effect or midline shift. ORBITS: No acute abnormality. SINUSES: No acute abnormality. SOFT TISSUES AND SKULL: No acute soft tissue abnormality. No skull fracture. IMPRESSION: 1. Normal for age non-contrast head CT. Electronically signed by: Helayne Hurst MD 02/25/2024 01:17 PM EST RP Workstation: [id number]   DG Chest Port 1 View Result Date: 02/25/2024 EXAM: 1 VIEW(S) XRAY OF THE CHEST 02/25/2024 11:45:00 AM COMPARISON: None available. CLINICAL HISTORY: Chest pain FINDINGS: LUNGS AND PLEURA: No focal pulmonary opacity. No pulmonary edema. No pleural effusion. No pneumothorax. HEART AND MEDIASTINUM: No acute abnormality of the cardiac and mediastinal silhouettes. BONES AND SOFT TISSUES: No acute osseous abnormality. IMPRESSION: 1. No acute cardiopulmonary process. Electronically signed by: Ryan Salvage MD 02/25/2024 12:48 PM EST RP Workstation: [id number]    ASSESSMENT & PLAN Jenna Stephens 48 y.o. female with medical history significant for iron  deficiency anemia secondary to GYN bleeding who presents for a follow up visit.   After review of the labs, review of the records, and discussion with the patient the patients findings are most consistent with iron  deficiency anemia  secondary to heavy menstrual bleeding.  The patient has establish care with OB/GYN and had a recent appointment that she had to reschedule due to a family emergency.  She notes that she will be rescheduling with OB/GYN.  At this time we will plan to bolster her iron  levels with IV iron  therapy given her failure of p.o. therapy to improve her counts.  The patient voiced understanding of the plan moving forward.  # Iron  Deficiency Anemia 2/2 to GYN Bleeding -- Findings are consistent with iron  deficiency anemia secondary to patient's menorrhagia --Encouraged her to follow-up with OB/GYN for better control of her menstrual cycles -- Labs from 09/13/2021 confirm diagnosis of iron  deficiency.  At next visit we will repeat iron  panel and ferritin as well as reticulocytes, CBC, and CMP --patient received IV monoferric  on 10/30/2021 Plan:  --Recommend she restart iron  pills ferrous sulfate  325 mg p.o. daily. --Labs today show white blood cell count 10.0, Hgb 10.2 ,MCV 66.4, Plt 344  --Pending results of serum iron  studies will determine if an additional dose of IV Monoferric  is required. --Return to clinic pending results of above studies.  # Hypertension # Headache  --BP 184/122 with headache, pain measures 9/10. --sent to ED for concern of hypertensive urgency     #  Leukocytosis--resolved  --Not present on labs today.  White blood cell count 10 --Etiology is unclear, though the patient is an active smoker.  It is of neutrophilic predominance. --Stably present for at least 5 years.  No orders of the defined types were placed in this encounter.   All questions were answered. The patient knows to call the clinic with any problems, questions or concerns.  A total of more than 30 minutes were spent on this encounter with face-to-face time and non-face-to-face time, including preparing to see the patient, ordering tests and/or medications, counseling the patient and coordination of care as outlined  above.   Norleen IVAR Kidney, MD Department of Hematology/Oncology [facility] Phone: [phone] Pager: [phone] Email: [email or web address]  03/01/2024 4:32 PM

## 2024-02-25 ENCOUNTER — Inpatient Hospital Stay: Attending: Hematology and Oncology

## 2024-02-25 ENCOUNTER — Emergency Department (HOSPITAL_COMMUNITY)

## 2024-02-25 ENCOUNTER — Other Ambulatory Visit: Payer: Self-pay

## 2024-02-25 ENCOUNTER — Inpatient Hospital Stay: Admitting: Hematology and Oncology

## 2024-02-25 ENCOUNTER — Emergency Department (HOSPITAL_COMMUNITY)
Admission: EM | Admit: 2024-02-25 | Discharge: 2024-02-25 | Disposition: A | Discharge status: Home / Self Care | Source: Ambulatory Visit | Attending: Emergency Medicine | Admitting: Emergency Medicine

## 2024-02-25 ENCOUNTER — Encounter (HOSPITAL_COMMUNITY): Payer: Self-pay

## 2024-02-25 VITALS — BP 184/122 | HR 72 | Temp 97.9°F | Resp 14 | Wt 282.1 lb

## 2024-02-25 DIAGNOSIS — N920 Excessive and frequent menstruation with regular cycle: Secondary | ICD-10-CM | POA: Insufficient documentation

## 2024-02-25 DIAGNOSIS — Z8249 Family history of ischemic heart disease and other diseases of the circulatory system: Secondary | ICD-10-CM | POA: Insufficient documentation

## 2024-02-25 DIAGNOSIS — R202 Paresthesia of skin: Secondary | ICD-10-CM | POA: Diagnosis not present

## 2024-02-25 DIAGNOSIS — Z7982 Long term (current) use of aspirin: Secondary | ICD-10-CM | POA: Insufficient documentation

## 2024-02-25 DIAGNOSIS — D72829 Elevated white blood cell count, unspecified: Secondary | ICD-10-CM | POA: Insufficient documentation

## 2024-02-25 DIAGNOSIS — R29810 Facial weakness: Secondary | ICD-10-CM | POA: Diagnosis not present

## 2024-02-25 DIAGNOSIS — D500 Iron deficiency anemia secondary to blood loss (chronic): Secondary | ICD-10-CM

## 2024-02-25 DIAGNOSIS — G43909 Migraine, unspecified, not intractable, without status migrainosus: Secondary | ICD-10-CM | POA: Diagnosis not present

## 2024-02-25 DIAGNOSIS — E669 Obesity, unspecified: Secondary | ICD-10-CM | POA: Insufficient documentation

## 2024-02-25 DIAGNOSIS — R519 Headache, unspecified: Secondary | ICD-10-CM

## 2024-02-25 DIAGNOSIS — E876 Hypokalemia: Secondary | ICD-10-CM | POA: Diagnosis not present

## 2024-02-25 DIAGNOSIS — I10 Essential (primary) hypertension: Secondary | ICD-10-CM | POA: Insufficient documentation

## 2024-02-25 DIAGNOSIS — J45909 Unspecified asthma, uncomplicated: Secondary | ICD-10-CM | POA: Insufficient documentation

## 2024-02-25 DIAGNOSIS — F419 Anxiety disorder, unspecified: Secondary | ICD-10-CM | POA: Insufficient documentation

## 2024-02-25 DIAGNOSIS — Z9089 Acquired absence of other organs: Secondary | ICD-10-CM | POA: Insufficient documentation

## 2024-02-25 DIAGNOSIS — F17210 Nicotine dependence, cigarettes, uncomplicated: Secondary | ICD-10-CM | POA: Insufficient documentation

## 2024-02-25 DIAGNOSIS — Z8632 Personal history of gestational diabetes: Secondary | ICD-10-CM | POA: Insufficient documentation

## 2024-02-25 DIAGNOSIS — G43109 Migraine with aura, not intractable, without status migrainosus: Secondary | ICD-10-CM | POA: Insufficient documentation

## 2024-02-25 DIAGNOSIS — Z825 Family history of asthma and other chronic lower respiratory diseases: Secondary | ICD-10-CM | POA: Insufficient documentation

## 2024-02-25 DIAGNOSIS — Z833 Family history of diabetes mellitus: Secondary | ICD-10-CM | POA: Insufficient documentation

## 2024-02-25 DIAGNOSIS — Z79899 Other long term (current) drug therapy: Secondary | ICD-10-CM | POA: Insufficient documentation

## 2024-02-25 DIAGNOSIS — R0789 Other chest pain: Secondary | ICD-10-CM | POA: Diagnosis not present

## 2024-02-25 DIAGNOSIS — R079 Chest pain, unspecified: Secondary | ICD-10-CM | POA: Diagnosis not present

## 2024-02-25 LAB — CBC WITH DIFFERENTIAL (CANCER CENTER ONLY)
Abs Immature Granulocytes: 0.04 K/uL (ref 0.00–0.07)
Basophils Absolute: 0 K/uL (ref 0.0–0.1)
Basophils Relative: 0 %
Eosinophils Absolute: 0.2 K/uL (ref 0.0–0.5)
Eosinophils Relative: 2 %
HCT: 35.3 % — ABNORMAL LOW (ref 36.0–46.0)
Hemoglobin: 10.2 g/dL — ABNORMAL LOW (ref 12.0–15.0)
Immature Granulocytes: 0 %
Lymphocytes Relative: 25 %
Lymphs Abs: 2.5 K/uL (ref 0.7–4.0)
MCH: 19.2 pg — ABNORMAL LOW (ref 26.0–34.0)
MCHC: 28.9 g/dL — ABNORMAL LOW (ref 30.0–36.0)
MCV: 66.4 fL — ABNORMAL LOW (ref 80.0–100.0)
Monocytes Absolute: 0.7 K/uL (ref 0.1–1.0)
Monocytes Relative: 7 %
Neutro Abs: 6.5 K/uL (ref 1.7–7.7)
Neutrophils Relative %: 66 %
Platelet Count: 344 K/uL (ref 150–400)
RBC: 5.32 MIL/uL — ABNORMAL HIGH (ref 3.87–5.11)
RDW: 17.5 % — ABNORMAL HIGH (ref 11.5–15.5)
WBC Count: 10 K/uL (ref 4.0–10.5)
nRBC: 0 % (ref 0.0–0.2)

## 2024-02-25 LAB — COMPREHENSIVE METABOLIC PANEL WITH GFR
ALT: 9 U/L (ref 0–44)
AST: 15 U/L (ref 15–41)
Albumin: 4.2 g/dL (ref 3.5–5.0)
Alkaline Phosphatase: 86 U/L (ref 38–126)
Anion gap: 12 (ref 5–15)
BUN: 9 mg/dL (ref 6–20)
CO2: 25 mmol/L (ref 22–32)
Calcium: 9.1 mg/dL (ref 8.9–10.3)
Chloride: 103 mmol/L (ref 98–111)
Creatinine, Ser: 0.63 mg/dL (ref 0.44–1.00)
GFR, Estimated: >60 mL/min (ref >60)
Glucose, Bld: 139 mg/dL — ABNORMAL HIGH (ref 70–99)
Potassium: 3.4 mmol/L — ABNORMAL LOW (ref 3.5–5.1)
Sodium: 140 mmol/L (ref 135–145)
Total Bilirubin: 0.4 mg/dL (ref 0.0–1.2)
Total Protein: 7.8 g/dL (ref 6.5–8.1)

## 2024-02-25 LAB — MAGNESIUM: Magnesium: 2.3 mg/dL (ref 1.7–2.4)

## 2024-02-25 LAB — CBC WITH DIFFERENTIAL/PLATELET
Abs Immature Granulocytes: 0.03 K/uL (ref 0.00–0.07)
Basophils Absolute: 0 K/uL (ref 0.0–0.1)
Basophils Relative: 0 %
Eosinophils Absolute: 0.2 K/uL (ref 0.0–0.5)
Eosinophils Relative: 2 %
HCT: 36.3 % (ref 36.0–46.0)
Hemoglobin: 10 g/dL — ABNORMAL LOW (ref 12.0–15.0)
Immature Granulocytes: 0 %
Lymphocytes Relative: 30 %
Lymphs Abs: 3.2 K/uL (ref 0.7–4.0)
MCH: 18.8 pg — ABNORMAL LOW (ref 26.0–34.0)
MCHC: 27.5 g/dL — ABNORMAL LOW (ref 30.0–36.0)
MCV: 68.4 fL — ABNORMAL LOW (ref 80.0–100.0)
Monocytes Absolute: 0.7 K/uL (ref 0.1–1.0)
Monocytes Relative: 7 %
Neutro Abs: 6.3 K/uL (ref 1.7–7.7)
Neutrophils Relative %: 61 %
Platelets: 341 K/uL (ref 150–400)
RBC: 5.31 MIL/uL — ABNORMAL HIGH (ref 3.87–5.11)
RDW: 17.5 % — ABNORMAL HIGH (ref 11.5–15.5)
Smear Review: NORMAL
WBC: 10.4 K/uL (ref 4.0–10.5)
nRBC: 0 % (ref 0.0–0.2)

## 2024-02-25 LAB — CMP (CANCER CENTER ONLY)
ALT: 9 U/L (ref 0–44)
AST: 11 U/L — ABNORMAL LOW (ref 15–41)
Albumin: 4.1 g/dL (ref 3.5–5.0)
Alkaline Phosphatase: 77 U/L (ref 38–126)
Anion gap: 6 (ref 5–15)
BUN: 10 mg/dL (ref 6–20)
CO2: 30 mmol/L (ref 22–32)
Calcium: 9 mg/dL (ref 8.9–10.3)
Chloride: 105 mmol/L (ref 98–111)
Creatinine: 0.74 mg/dL (ref 0.44–1.00)
GFR, Estimated: >60 mL/min (ref >60)
Glucose, Bld: 155 mg/dL — ABNORMAL HIGH (ref 70–99)
Potassium: 3.5 mmol/L (ref 3.5–5.1)
Sodium: 141 mmol/L (ref 135–145)
Total Bilirubin: 0.4 mg/dL (ref 0.0–1.2)
Total Protein: 7.3 g/dL (ref 6.5–8.1)

## 2024-02-25 LAB — RETIC PANEL
Immature Retic Fract: 32.5 % — ABNORMAL HIGH (ref 2.3–15.9)
RBC.: 5.26 MIL/uL — ABNORMAL HIGH (ref 3.87–5.11)
Retic Count, Absolute: 127.8 K/uL (ref 19.0–186.0)
Retic Ct Pct: 2.4 % (ref 0.4–3.1)
Reticulocyte Hemoglobin: 17.7 pg — ABNORMAL LOW (ref >27.9)

## 2024-02-25 LAB — HCG, SERUM, QUALITATIVE: Preg, Serum: NEGATIVE

## 2024-02-25 LAB — IRON AND IRON BINDING CAPACITY (CC-WL,HP ONLY)
Iron: 20 ug/dL — ABNORMAL LOW (ref 28–170)
Saturation Ratios: 4 % — ABNORMAL LOW (ref 10.4–31.8)
TIBC: 476 ug/dL — ABNORMAL HIGH (ref 250–450)
UIBC: 456 ug/dL — ABNORMAL HIGH (ref 148–442)

## 2024-02-25 LAB — TROPONIN T, HIGH SENSITIVITY
Troponin T High Sensitivity: <15 ng/L (ref 0–19)
Troponin T High Sensitivity: <15 ng/L (ref 0–19)

## 2024-02-25 LAB — FERRITIN: Ferritin: 19 ng/mL (ref 11–307)

## 2024-02-25 MED ORDER — HYDRALAZINE HCL 20 MG/ML IJ SOLN
10.0000 mg | Freq: Once | INTRAMUSCULAR | Status: AC
  Administered 2024-02-25: 10 mg via INTRAVENOUS
  Filled 2024-02-25: qty 1

## 2024-02-25 MED ORDER — DIPHENHYDRAMINE HCL 50 MG/ML IJ SOLN
25.0000 mg | Freq: Once | INTRAMUSCULAR | Status: AC
  Administered 2024-02-25: 25 mg via INTRAVENOUS
  Filled 2024-02-25: qty 1

## 2024-02-25 MED ORDER — METOCLOPRAMIDE HCL 5 MG/ML IJ SOLN
5.0000 mg | Freq: Once | INTRAMUSCULAR | Status: AC
  Administered 2024-02-25: 5 mg via INTRAVENOUS
  Filled 2024-02-25: qty 2

## 2024-02-25 MED ORDER — POTASSIUM CHLORIDE CRYS ER 20 MEQ PO TBCR
40.0000 meq | EXTENDED_RELEASE_TABLET | Freq: Once | ORAL | Status: AC
  Administered 2024-02-25: 40 meq via ORAL
  Filled 2024-02-25: qty 2

## 2024-02-25 MED ORDER — METOCLOPRAMIDE HCL 5 MG PO TABS: 5.0000 mg | ORAL_TABLET | Freq: Three times a day (TID) | ORAL | 0 refills | Status: DC | PRN

## 2024-02-25 NOTE — ED Triage Notes (Addendum)
 Pt to er, pt states that she was sent over from the cancer center because she had elevated blood pressure, and a headache, states that she has some tingling in the fingers on her L hand. Pt has a slight facial droop on the L side. MD at bedside for stroke eval.  Pt states that she has had the headache since she woke up.  Per MD the last known normal was last night around 11pm, pt is not a code stroke at this time.

## 2024-02-25 NOTE — Discharge Instructions (Addendum)
 It was a pleasure caring for you today in the emergency department.  Please monitor your blood pressure closely.  Please follow Dash diet as outlined on handout.  Please follow-up with your primary care provider regarding blood pressure medication management. Avoid stimulants such as caffeine . Drink plenty of clear fluids.   Please return to the emergency department for any worsening or worrisome symptoms.

## 2024-02-25 NOTE — ED Provider Triage Note (Signed)
 Emergency Medicine Provider Triage Evaluation Note  Jenna Stephens , a 48 y.o. female  was evaluated in triage.  Pt complains of elev bp, ha, numb/tingling.  Review of Systems  Positive: ha Negative: fever  Physical Exam  BP (!) 216/121 (BP Location: Left Arm)   Pulse 78   Temp 98.4 F (36.9 C) (Oral)   Resp 18   Ht 5' 7 (1.702 m)   Wt 127.9 kg   SpO2 100%   BMI 44.17 kg/m  Gen:   Awake, no distress   Resp:  Normal effort  MSK:   Moves extremities without difficulty  Other:  Reduced 2pt discrimination to left side of face and LUE, strength 5/5, no vision change, no aphasia, no weakness  Medical Decision Making  Medically screening exam initiated at 11:29 AM.  Appropriate orders placed.  TROI BECHTOLD was informed that the remainder of the evaluation will be completed by another provider, this initial triage assessment does not replace that evaluation, and the importance of remaining in the ED until their evaluation is complete.  Pt w/ elev bp, ha, arm tingling/numbness, LKN was 2300 last pm. Sent from cancer center for +BP   Elnor Jayson LABOR, DO 02/25/24 1130

## 2024-02-25 NOTE — ED Provider Notes (Signed)
 [place] EMERGENCY DEPARTMENT AT [facility] Provider Note  CSN: [id number] Arrival date & time: 02/25/24 1106  Chief Complaint(s) Hypertension  HPI Jenna Stephens is a 48 y.o. female with past medical history as below, significant for , IDA, obesity, malignant hypertension, oncological bleeding, GAD who presents to the ED with complaint of headache, elevated blood pressure, tingling  Patient was seen at the cancer center this morning regarding her iron  deficiency anemia/gyn bleeding.  Patient was noted to have elevated blood pressure and headache and was sent to the ER for evaluation.  On my assessment patient reports frontal, temporal headache, throbbing, pulsing in nature.  Also having associated left-sided facial tingling and left arm tingling.  Symptoms noticed upon waking this morning.  Went to bed last night around 11 PM, last known normal.  Denies any vision changes, no speech disturbance, no weakness to her extremities.  Reports compliance with her typical medications.  No recent stimulant or illicit substance use.  Past Medical History Past Medical History:  Diagnosis Date   Acute respiratory failure (HCC) 12/29/2017   Anxiety    Asthma    Gestational diabetes    Hypertension    Iron  deficiency anemia    Obesity    Patient Active Problem List   Diagnosis Date Noted   Screening mammogram for breast cancer 01/22/2024   Abnormal EKG 01/22/2024   Screening for colon cancer 01/22/2024   Need for immunization against influenza 01/21/2024   Prolonged QT interval 01/21/2024   Class 3 obesity (HCC) 11/06/2022   Iron  deficiency anemia due to chronic blood loss 06/28/2021   Gastroesophageal reflux disease with esophagitis without hemorrhage 06/28/2021   Encounter for general adult medical examination with abnormal findings 06/27/2021   Malignant hypertension 06/27/2021   Type 2 diabetes mellitus without complication, without long-term current use of insulin  (HCC)  06/27/2021   Hyperlipidemia LDL goal <100 06/27/2021   GAD (generalized anxiety disorder) 06/27/2021   Visit for screening mammogram 06/27/2021   Screening for cervical cancer 06/27/2021   Loud snoring 06/27/2021   Need for vaccination 06/27/2021   Hypertensive urgency 10/15/2019   Hypokalemia 06/09/2018   Severe persistent asthma without complication (HCC) 02/03/2018   Obesity 12/29/2017   Home Medication(s) Prior to Admission medications   Medication Sig Start Date End Date Taking? Authorizing Provider  metoCLOPramide  (REGLAN ) 5 MG tablet Take 1 tablet (5 mg total) by mouth 3 (three) times daily as needed for up to 8 doses (headache). 02/25/24  Yes Elnor Savant A, DO  albuterol  (VENTOLIN  HFA) 108 (90 Base) MCG/ACT inhaler Inhale 1-2 puffs into the lungs every 6 (six) hours as needed for wheezing or shortness of breath. Patient taking differently: Inhale 2 puffs into the lungs as needed for wheezing or shortness of breath. 04/11/21   Billy Asberry FALCON, PA-C  ALPRAZolam  (XANAX ) 1 MG tablet Take 1 tablet (1 mg total) by mouth 2 (two) times daily as needed for anxiety. 12/26/23   Plotnikov, Karlynn GAILS, MD  [employer] ASPIRIN  LOW DOSE 81 MG tablet TAKE 1 TABLET (81 MG TOTAL) BY MOUTH DAILY. SWALLOW WHOLE. 03/30/23   Joshua Debby CROME, MD  Ferric Maltol  (ACCRUFER ) 30 MG CAPS Take 1 capsule (30 mg total) by mouth in the morning and at bedtime. 01/21/24   Joshua Debby CROME, MD  Fluticasone -Umeclidin-Vilant (TRELEGY ELLIPTA ) 100-62.5-25 MCG/ACT AEPB Inhale 1 puff into the lungs daily. 11/13/22   Joshua Debby CROME, MD  ipratropium-albuterol  (DUONEB) 0.5-2.5 (3) MG/3ML SOLN Take 3 mLs by nebulization every  4 (four) hours as needed. Patient taking differently: Take 3 mLs by nebulization as needed (wheezing/SOB). 02/06/19   Hope Almarie ORN, NP  nadolol (CORGARD) 20 MG tablet Take 1 tablet (20 mg total) by mouth daily. 01/21/24   Joshua Debby CROME, MD  omeprazole (PRILOSEC) 40 MG capsule Take 1 capsule (40 mg total) by  mouth daily. 06/29/21   Joshua Debby CROME, MD  potassium chloride  (KLOR-CON ) 10 MEQ tablet TAKE 1 TABLET BY MOUTH 2 TIMES DAILY. 02/10/23   Joshua Debby CROME, MD  rosuvastatin  (CRESTOR ) 20 MG tablet Take 1 tablet (20 mg total) by mouth daily. 01/21/24   Joshua Debby CROME, MD  sertraline  (ZOLOFT ) 50 MG tablet Take 1 tablet (50 mg total) by mouth daily. 02/22/24   Joshua Debby CROME, MD  triamterene -hydrochlorothiazide  (DYAZIDE ) 37.5-25 MG capsule Take 1 each (1 capsule total) by mouth daily. 01/21/24   Joshua Debby CROME, MD                                                                                                                                    Past Surgical History Past Surgical History:  Procedure Laterality Date   TONSILLECTOMY     TUBAL LIGATION N/A 10/07/2018   Procedure: POST PARTUM TUBAL LIGATION;  Surgeon: Barbra Lang PARAS, DO;  Location: [facility] LD ORS;  Service: Gynecology;  Laterality: N/A;   Family History Family History  Problem Relation Age of Onset   Diabetes Mother    Hypertension Mother    Hypertension Father    COPD Father     Social History Social History   Tobacco Use   Smoking status: Some Days    Current packs/day: 0.00    Types: Cigarettes    Last attempt to quit: 01/06/2018    Years since quitting: 6.1    Passive exposure: Past   Smokeless tobacco: Never  Vaping Use   Vaping status: Never Used  Substance Use Topics   Alcohol use: Yes    Alcohol/week: 2.0 standard drinks of alcohol    Types: 2 Standard drinks or equivalent per week   Drug use: Not Currently    Types: Marijuana   Allergies Patient has no known allergies.  Review of Systems A thorough review of systems was obtained and all systems are negative except as noted in the HPI and PMH.   Physical Exam Vital Signs  I have reviewed the triage vital signs BP (!) 160/101   Pulse 67   Temp 98.4 F (36.9 C) (Oral)   Resp 17   Ht 5' 7 (1.702 m)   Wt 127.9 kg   LMP 02/16/2024 (Exact Date)   SpO2 99%    BMI 44.17 kg/m  Physical Exam Vitals and nursing note reviewed.  Constitutional:      General: She is not in acute distress.    Appearance: Normal appearance. She is well-developed. She is obese. She is not ill-appearing.  HENT:  Head: Normocephalic and atraumatic.     Right Ear: External ear normal.     Left Ear: External ear normal.     Nose: Nose normal.     Mouth/Throat:     Mouth: Mucous membranes are moist.  Eyes:     General: No scleral icterus.       Right eye: No discharge.        Left eye: No discharge.     Extraocular Movements: Extraocular movements intact.     Pupils: Pupils are equal, round, and reactive to light.  Cardiovascular:     Rate and Rhythm: Normal rate and regular rhythm.  Pulmonary:     Effort: Pulmonary effort is normal. No respiratory distress.     Breath sounds: No stridor.  Abdominal:     General: Abdomen is flat. There is no distension.     Tenderness: There is no guarding.  Musculoskeletal:        General: No deformity.     Cervical back: No rigidity.  Skin:    General: Skin is warm and dry.     Coloration: Skin is not cyanotic, jaundiced or pale.  Neurological:     Mental Status: She is alert and oriented to person, place, and time.     GCS: GCS eye subscore is 4. GCS verbal subscore is 5. GCS motor subscore is 6.     Cranial Nerves: Cranial nerves 2-12 are intact. No dysarthria or facial asymmetry.     Sensory: Sensory deficit present.     Motor: Motor function is intact. No weakness or tremor.     Coordination: Coordination is intact. Coordination normal. Finger-Nose-Finger Test normal.     Gait: Gait is intact.     Comments: Strength 5/5 to BLUE/BLLE, equal and symmetric   Reduced 2 point discrimination to entire left side of face and left upper extremity. O/w sensation wnl    Psychiatric:        Speech: Speech normal.        Behavior: Behavior normal. Behavior is cooperative.     ED Results and Treatments Labs (all labs  ordered are listed, but only abnormal results are displayed) Labs Reviewed  COMPREHENSIVE METABOLIC PANEL WITH GFR - Abnormal; Notable for the following components:      Result Value   Potassium 3.4 (*)    Glucose, Bld 139 (*)    All other components within normal limits  CBC WITH DIFFERENTIAL/PLATELET - Abnormal; Notable for the following components:   RBC 5.31 (*)    Hemoglobin 10.0 (*)    MCV 68.4 (*)    MCH 18.8 (*)    MCHC 27.5 (*)    RDW 17.5 (*)    All other components within normal limits  HCG, SERUM, QUALITATIVE  MAGNESIUM   TROPONIN T, HIGH SENSITIVITY  TROPONIN T, HIGH SENSITIVITY  Radiology No results found.   Pertinent labs & imaging results that were available during my care of the patient were reviewed by me and considered in my medical decision making (see MDM for details).  Medications Ordered in ED Medications  metoCLOPramide  (REGLAN ) injection 5 mg (5 mg Intravenous Given 02/25/24 1156)  diphenhydrAMINE  (BENADRYL ) injection 25 mg (25 mg Intravenous Given 02/25/24 1156)  hydrALAZINE  (APRESOLINE ) injection 10 mg (10 mg Intravenous Given 02/25/24 1507)  potassium chloride  SA (KLOR-CON  M) CR tablet 40 mEq (40 mEq Oral Given 02/25/24 1624)                                                                                                                                     Procedures Procedures  (including critical care time)  Medical Decision Making / ED Course    Medical Decision Making:    MARTAVIA TYE is a 48 y.o. female with past medical history as below, significant for , IDA, obesity, malignant hypertension, GAD who presents to the ED with complaint of headache, elevated blood pressure, tingling. The complaint involves an extensive differential diagnosis and also carries with it a high risk of complications and morbidity.  Serious etiology  was considered. Ddx includes but is not limited to: Differential diagnosis includes but is not exclusive to subarachnoid hemorrhage, meningitis, encephalitis, previous head trauma, cavernous venous thrombosis, muscle tension headache, glaucoma, temporal arteritis, migraine or migraine equivalent, etc.   Complete initial physical exam performed, notably the patient was in no acute distress, blood pressure is acutely elevated.    Reviewed and confirmed nursing documentation for past medical history, family history, social history.  Vital signs reviewed.    Headache Uncontrolled hypertension> - Patient with migraine type headache, sensation change to left side of her face and left upper extremity.  Complex migraine seems most likely at this time.  She is not a candidate for TNK given last known normal yesterday around 11 PM.  She is also Fleeta negative.  Does not meet criteria for stroke activation. -Blood pressure is also acutely elevated. - Patient reports precipitous improvement to her symptoms with migraine cocktail, headache improved, blood pressure improving - CTH wnl, MRI brain wnl - Likely complicated migraine, doubt CVA, TIA.  Doubt hypertensive emergency - Encourage patient follow-up with her PCP regarding blood pressure mgmt   Clinical Course as of 02/27/24 0736  Tue Feb 25, 2024  1248 Symptoms improving, BP improving [SG]  1527 BP improving, symptoms continue to improve  [SG]    Clinical Course User Index [SG] Elnor Jayson LABOR, DO      I have discussed the diagnosis/risks/treatment options with the patient.  Evaluation and diagnostic testing in the emergency department does not suggest an emergent condition requiring admission or immediate intervention beyond what has been performed at this time.  They will follow up with pcp. We also discussed returning to the ED immediately if new or worsening  sx occur. We discussed the sx which are most concerning (e.g., sudden worsening pain,  fever, inability to tolerate by mouth , weakness/numbness) that necessitate immediate return.    The patient appears reasonably screened and/or stabilized for discharge and I doubt any other medical condition or other [facility] requiring further screening, evaluation, or treatment in the ED at this time prior to discharge.                 Additional history obtained: -Additional history obtained from family -External records from outside source obtained and reviewed including: Chart review including previous notes, labs, imaging, consultation notes including  Home meds, prior bp values   Lab Tests: -I ordered, reviewed, and interpreted labs.   The pertinent results include:   Labs Reviewed  COMPREHENSIVE METABOLIC PANEL WITH GFR - Abnormal; Notable for the following components:      Result Value   Potassium 3.4 (*)    Glucose, Bld 139 (*)    All other components within normal limits  CBC WITH DIFFERENTIAL/PLATELET - Abnormal; Notable for the following components:   RBC 5.31 (*)    Hemoglobin 10.0 (*)    MCV 68.4 (*)    MCH 18.8 (*)    MCHC 27.5 (*)    RDW 17.5 (*)    All other components within normal limits  HCG, SERUM, QUALITATIVE  MAGNESIUM   TROPONIN T, HIGH SENSITIVITY  TROPONIN T, HIGH SENSITIVITY    Notable for labs stable  EKG   EKG Interpretation Date/Time:  Tuesday February 25 2024 11:36:00 EST Ventricular Rate:  72 PR Interval:  152 QRS Duration:  94 QT Interval:  410 QTC Calculation: 449 R Axis:   28  Text Interpretation: Sinus rhythm Ventricular premature complex Borderline T wave abnormality lateral leads Confirmed by Geroldine Berg ([id number]) on 02/27/2024 12:33:58 AM         Imaging Studies ordered: I ordered imaging studies including CTH MRI brain wo, CXR I independently visualized the following imaging with scope of interpretation limited to determining acute life threatening conditions related to emergency care; findings noted above I agree  with the radiologist interpretation If any imaging was obtained with contrast I closely monitored patient for any possible adverse reaction a/w contrast administration in the emergency department   Medicines ordered and prescription drug management: Meds ordered this encounter  Medications   metoCLOPramide  (REGLAN ) injection 5 mg   diphenhydrAMINE  (BENADRYL ) injection 25 mg   hydrALAZINE  (APRESOLINE ) injection 10 mg   metoCLOPramide  (REGLAN ) 5 MG tablet    Sig: Take 1 tablet (5 mg total) by mouth 3 (three) times daily as needed for up to 8 doses (headache).    Dispense:  8 tablet    Refill:  0   potassium chloride  SA (KLOR-CON  M) CR tablet 40 mEq    -I have reviewed the patients home medicines and have made adjustments as needed   Consultations Obtained: na   Cardiac Monitoring: The patient was maintained on a cardiac monitor.  I personally viewed and interpreted the cardiac monitored which showed an underlying rhythm of: nsr Continuous pulse oximetry interpreted by myself, 99% on RA.    Social Determinants of Health:  Diagnosis or treatment significantly limited by social determinants of health: obesity   Reevaluation: After the interventions noted above, I reevaluated the patient and found that they have improved  Co morbidities that complicate the patient evaluation  Past Medical History:  Diagnosis Date   Acute respiratory failure (HCC) 12/29/2017   Anxiety  Asthma    Gestational diabetes    Hypertension    Iron  deficiency anemia    Obesity       Dispostion: Disposition decision including need for hospitalization was considered, and patient discharged from emergency department.    Final Clinical Impression(s) / ED Diagnoses Final diagnoses:  Complicated migraine  Poorly-controlled hypertension  Atypical chest pain  Hypokalemia        Elnor Jayson LABOR, DO 02/27/24 [phone]

## 2024-02-26 ENCOUNTER — Telehealth: Payer: Self-pay

## 2024-02-26 NOTE — Transitions of Care (Post Inpatient/ED Visit) (Signed)
   02/26/2024  Name: Jenna Stephens MRN: [id number] DOB: 09/02/1975  Today's TOC FU Call Status: Today's TOC FU Call Status::  (upcoming visit) Patient's Name and Date of Birth confirmed.  Transition Care Management Follow-up Telephone Call Date of Discharge: 02/25/24 Discharge Facility: [facility]) Type of Discharge: Emergency Department Reason for ED Visit: Other: (migraine) Any questions or concerns?: No  Items Reviewed: Did you receive and understand the discharge instructions provided?: Yes Medications obtained,verified, and reconciled?: Yes (Medications Reviewed) Any new allergies since your discharge?: No Dietary orders reviewed?: NA Do you have support at home?: Yes  Medications Reviewed Today: Medications Reviewed Today   Medications were not reviewed in this encounter     Home Care and Equipment/Supplies: Were Home Health Services Ordered?: NA Any new equipment or medical supplies ordered?: NA  Functional Questionnaire: Do you need assistance with bathing/showering or dressing?: No Do you need assistance with meal preparation?: No Do you need assistance with eating?: No Do you have difficulty maintaining continence: No Do you need assistance with getting out of bed/getting out of a chair/moving?: No Do you have difficulty managing or taking your medications?: No  Follow up appointments reviewed: PCP Follow-up appointment confirmed?: Yes Date of PCP follow-up appointment?: 03/17/24 [facility] Follow-up appointment confirmed?: NA Do you need transportation to your follow-up appointment?: No    SIGNATURE Ronnald Palms

## 2024-03-01 ENCOUNTER — Encounter: Payer: Self-pay | Admitting: Hematology and Oncology

## 2024-03-03 ENCOUNTER — Other Ambulatory Visit: Payer: Self-pay | Admitting: *Deleted

## 2024-03-03 NOTE — Patient Instructions (Signed)
 Visit Information  Jenna Stephens was given information about Medicaid Managed Care team care coordination services as a part of their [facility] Community Plan Medicaid benefit.   If you would like to schedule transportation through your [facility], please call the following number at least 2 days in advance of your appointment: [phone]   Rides for urgent appointments can also be made after hours by calling Member Services.  Call the Behavioral Health Crisis Line at [phone], at any time, 24 hours a day, 7 days a week. If you are in danger or need immediate medical attention call 911.  Care plan and visit instructions communicated with the patient verbally today. Patient agrees to receive a copy in MyChart. Active MyChart status and patient understanding of how to access instructions and care plan via MyChart confirmed with patient.     Licensed Clinical Social Worker will follow up with patient on 03/30/24  Jenna Wendland, LCSW [place]  [facility] Licensed Clinical Social Worker  Direct Dial: [phone]     Following is a copy of your plan of care:  There are no care plans that you recently modified to display for this patient.

## 2024-03-03 NOTE — Patient Outreach (Signed)
 Complex Care Management   Visit Note  03/03/2024  Name:  Jenna Stephens MRN: [id number] DOB: Oct 20, 1975  Situation: Referral received for Complex Care Management related to Mental/Behavioral Health diagnosis Anxiety I obtained verbal consent from Patient.  Visit completed with Patient  on the phone  Background:   Past Medical History:  Diagnosis Date   Acute respiratory failure (HCC) 12/29/2017   Anxiety    Asthma    Gestational diabetes    Hypertension    Iron  deficiency anemia    Obesity     Assessment: Patient Reported Symptoms:  Cognitive Cognitive Status: Able to follow simple commands, Alert and oriented to person, place, and time, Difficulties with attention and concentration, Insightful and able to interpret abstract concepts Cognitive/Intellectual Conditions Management [RPT]: None reported or documented in medical history or problem list   Health Maintenance Behaviors: None  Neurological Neurological Review of Symptoms: No symptoms reported    HEENT HEENT Symptoms Reported: No symptoms reported      Cardiovascular Cardiovascular Symptoms Reported: Fatigue Does patient have uncontrolled Hypertension?: Yes Is patient checking Blood Pressure at home?: No Cardiovascular Management Strategies: Adequate rest, Coping strategies, Medication therapy  Respiratory Respiratory Symptoms Reported: Chest tightness, Shortness of breath Other Respiratory Symptoms: mainly when stressed    Endocrine Endocrine Symptoms Reported: No symptoms reported Is patient diabetic?: No    Gastrointestinal Gastrointestinal Symptoms Reported: No symptoms reported      Genitourinary Genitourinary Symptoms Reported: No symptoms reported    Integumentary Integumentary Symptoms Reported: No symptoms reported    Musculoskeletal Musculoskelatal Symptoms Reviewed: Back pain Additional Musculoskeletal Details: lower back pain, sitting all day at work, looking for a stand up desk Musculoskeletal  Management Strategies: Adequate rest, Coping strategies      Psychosocial Psychosocial Symptoms Reported: Depression - if selected complete PHQ 2-9, Anxiety - if selected complete GAD   Techniques to Cope with Loss/Stress/Change: Diversional activities Quality of Family Relationships: supportive, involved Do you feel physically threatened by others?: No    03/03/2024    PHQ2-9 Depression Screening   Little interest or pleasure in doing things Nearly every day  Feeling down, depressed, or hopeless Nearly every day  PHQ-2 - Total Score 6  Trouble falling or staying asleep, or sleeping too much Nearly every day  Feeling tired or having little energy Nearly every day  Poor appetite or overeating  Nearly every day  Feeling bad about yourself - or that you are a failure or have let yourself or your family down    Trouble concentrating on things, such as reading the newspaper or watching television Nearly every day  Moving or speaking so slowly that other people could have noticed.  Or the opposite - being so fidgety or restless that you have been moving around a lot more than usual Not at all  Thoughts that you would be better off dead, or hurting yourself in some way Several days  PHQ2-9 Total Score 19  If you checked off any problems, how difficult have these problems made it for you to do your work, take care of things at home, or get along with other people    Depression Interventions/Treatment Referral to Psychiatry, Counseling, Medication    There were no vitals filed for this visit.    Medications Reviewed Today     Reviewed by Ermalinda Lenn HERO, LCSW (Social Worker) on 03/03/24 at 1133  Med List Status: <None>   Medication Order Taking? Sig Documenting Provider Last Dose Status Informant  albuterol  (  VENTOLIN  HFA) 108 (90 Base) MCG/ACT inhaler [id number] Yes Inhale 1-2 puffs into the lungs every 6 (six) hours as needed for wheezing or shortness of breath.  Patient taking  differently: Inhale 2 puffs into the lungs as needed for wheezing or shortness of breath.   Billy Asberry FALCON, PA-C  Active Self, Pharmacy Records  ALPRAZolam  (XANAX ) 1 MG tablet [id number] Yes Take 1 tablet (1 mg total) by mouth 2 (two) times daily as needed for anxiety. Plotnikov, Aleksei V, MD  Active   [facility] ASPIRIN  LOW DOSE 81 MG tablet [id number] Yes TAKE 1 TABLET (81 MG TOTAL) BY MOUTH DAILY. SWALLOW WHOLE. Joshua Debby CROME, MD  Active   Ferric Maltol  (ACCRUFER ) 30 MG CAPS [id number] Yes Take 1 capsule (30 mg total) by mouth in the morning and at bedtime. Joshua Debby CROME, MD  Active   Fluticasone -Umeclidin-Vilant (TRELEGY ELLIPTA ) 100-62.5-25 MCG/ACT AEPB [id number] Yes Inhale 1 puff into the lungs daily. Joshua Debby CROME, MD  Active   ipratropium-albuterol  (DUONEB) 0.5-2.5 (3) MG/3ML SOLN [id number] Yes Take 3 mLs by nebulization every 4 (four) hours as needed.  Patient taking differently: Take 3 mLs by nebulization as needed (wheezing/SOB).   Hope Almarie ORN, NP  Active Self, Pharmacy Records  metoCLOPramide  (REGLAN ) 5 MG tablet [id number] Yes Take 1 tablet (5 mg total) by mouth 3 (three) times daily as needed for up to 8 doses (headache). Elnor Jayson LABOR, DO  Active   nadolol (CORGARD) 20 MG tablet [id number] Yes Take 1 tablet (20 mg total) by mouth daily. Joshua Debby CROME, MD  Active   omeprazole (PRILOSEC) 40 MG capsule [id number] Yes Take 1 capsule (40 mg total) by mouth daily. Joshua Debby CROME, MD  Active Self, Pharmacy Records  potassium chloride  (KLOR-CON ) 10 MEQ tablet [id number] Yes TAKE 1 TABLET BY MOUTH 2 TIMES DAILY. Joshua Debby CROME, MD  Active   rosuvastatin  (CRESTOR ) 20 MG tablet [id number] Yes Take 1 tablet (20 mg total) by mouth daily. Joshua Debby CROME, MD  Active   sertraline  (ZOLOFT ) 50 MG tablet [id number] Yes Take 1 tablet (50 mg total) by mouth daily. Joshua Debby CROME, MD  Active   triamterene -hydrochlorothiazide  (DYAZIDE ) 37.5-25 MG capsule [id number] Yes Take 1 each (1 capsule total) by  mouth daily. Joshua Debby CROME, MD  Active             Recommendation:   PCP Follow-up Specialty provider follow-up as scheduled  Follow Up Plan:   Telephone follow up appointment date/time:  03/30/24 11:15am  Jameshia Hayashida Ermalinda HUGHS [place]  [facility] Licensed Clinical Social Worker  Direct Dial: [phone]

## 2024-03-04 ENCOUNTER — Telehealth: Payer: Self-pay

## 2024-03-04 ENCOUNTER — Other Ambulatory Visit (HOSPITAL_COMMUNITY): Payer: Self-pay | Admitting: Hematology and Oncology

## 2024-03-04 NOTE — Telephone Encounter (Signed)
 Dr. Federico, patient will be scheduled as soon as possible.  Auth Submission: APPROVED Site of care: Site of care: [facility] Payer: [employer] commercial Medication & CPT/J Code(s) submitted: Venofer (Iron  Sucrose) J1756 Diagnosis Code:  Route of submission (phone, fax, portal): phone Phone # [phone] [facility] Rx  Fax # [phone] clinicals and labs Auth type: Buy/Bill PB Units/visits requested: 200mg  x 5 doses Reference number: [id number] Approval from: 03/04/24 to 03/04/25   Approval letter in the media tab.

## 2024-03-06 ENCOUNTER — Telehealth (HOSPITAL_COMMUNITY): Payer: Self-pay | Admitting: *Deleted

## 2024-03-06 NOTE — Telephone Encounter (Signed)
 Reminder call with instructions for upcoming stress test on 03/13/24 at 11:30

## 2024-03-11 ENCOUNTER — Ambulatory Visit (INDEPENDENT_AMBULATORY_CARE_PROVIDER_SITE_OTHER): Admitting: Internal Medicine

## 2024-03-11 ENCOUNTER — Encounter: Payer: Self-pay | Admitting: Internal Medicine

## 2024-03-11 VITALS — BP 160/90 | HR 95 | Temp 98.1°F | Resp 16 | Ht 67.0 in | Wt 279.0 lb

## 2024-03-11 DIAGNOSIS — K2100 Gastro-esophageal reflux disease with esophagitis, without bleeding: Secondary | ICD-10-CM | POA: Diagnosis not present

## 2024-03-11 DIAGNOSIS — I10 Essential (primary) hypertension: Secondary | ICD-10-CM | POA: Diagnosis not present

## 2024-03-11 DIAGNOSIS — J4551 Severe persistent asthma with (acute) exacerbation: Secondary | ICD-10-CM

## 2024-03-11 DIAGNOSIS — E119 Type 2 diabetes mellitus without complications: Secondary | ICD-10-CM | POA: Diagnosis not present

## 2024-03-11 DIAGNOSIS — E876 Hypokalemia: Secondary | ICD-10-CM

## 2024-03-11 DIAGNOSIS — T502X5A Adverse effect of carbonic-anhydrase inhibitors, benzothiadiazides and other diuretics, initial encounter: Secondary | ICD-10-CM

## 2024-03-11 DIAGNOSIS — F411 Generalized anxiety disorder: Secondary | ICD-10-CM

## 2024-03-11 DIAGNOSIS — Z7985 Long-term (current) use of injectable non-insulin antidiabetic drugs: Secondary | ICD-10-CM

## 2024-03-11 MED ORDER — SPIRONOLACTONE 25 MG PO TABS: 25.0000 mg | ORAL_TABLET | Freq: Every day | ORAL | 0 refills | Status: DC

## 2024-03-11 MED ORDER — TRELEGY ELLIPTA 100-62.5-25 MCG/ACT IN AEPB: 1.0000 | INHALATION_SPRAY | Freq: Every day | RESPIRATORY_TRACT | 1 refills | Status: DC

## 2024-03-11 MED ORDER — OMEPRAZOLE 40 MG PO CPDR: 40.0000 mg | DELAYED_RELEASE_CAPSULE | Freq: Every day | ORAL | 1 refills | Status: DC

## 2024-03-11 MED ORDER — AMLODIPINE BESYLATE 5 MG PO TABS: 5.0000 mg | ORAL_TABLET | Freq: Every day | ORAL | 0 refills | Status: AC

## 2024-03-11 MED ORDER — MOUNJARO 2.5 MG/0.5ML ~~LOC~~ SOAJ: 2.5000 mg | SUBCUTANEOUS | 0 refills | Status: DC

## 2024-03-11 MED ORDER — SERTRALINE HCL 100 MG PO TABS: 100.0000 mg | ORAL_TABLET | Freq: Every day | ORAL | 0 refills | Status: AC

## 2024-03-11 NOTE — Patient Instructions (Signed)
 Hypertension, Adult High blood pressure (hypertension) is when the force of blood pumping through the arteries is too strong. The arteries are the blood vessels that carry blood from the heart throughout the body. Hypertension forces the heart to work harder to pump blood and may cause arteries to become narrow or stiff. Untreated or uncontrolled hypertension can lead to a heart attack, heart failure, a stroke, kidney disease, and other problems. A blood pressure reading consists of a higher number over a lower number. Ideally, your blood pressure should be below 120/80. The first ("top") number is called the systolic pressure. It is a measure of the pressure in your arteries as your heart beats. The second ("bottom") number is called the diastolic pressure. It is a measure of the pressure in your arteries as the heart relaxes. What are the causes? The exact cause of this condition is not known. There are some conditions that result in high blood pressure. What increases the risk? Certain factors may make you more likely to develop high blood pressure. Some of these risk factors are under your control, including: Smoking. Not getting enough exercise or physical activity. Being overweight. Having too much fat, sugar, calories, or salt (sodium) in your diet. Drinking too much alcohol. Other risk factors include: Having a personal history of heart disease, diabetes, high cholesterol, or kidney disease. Stress. Having a family history of high blood pressure and high cholesterol. Having obstructive sleep apnea. Age. The risk increases with age. What are the signs or symptoms? High blood pressure may not cause symptoms. Very high blood pressure (hypertensive crisis) may cause: Headache. Fast or irregular heartbeats (palpitations). Shortness of breath. Nosebleed. Nausea and vomiting. Vision changes. Severe chest pain, dizziness, and seizures. How is this diagnosed? This condition is diagnosed by  measuring your blood pressure while you are seated, with your arm resting on a flat surface, your legs uncrossed, and your feet flat on the floor. The cuff of the blood pressure monitor will be placed directly against the skin of your upper arm at the level of your heart. Blood pressure should be measured at least twice using the same arm. Certain conditions can cause a difference in blood pressure between your right and left arms. If you have a high blood pressure reading during one visit or you have normal blood pressure with other risk factors, you may be asked to: Return on a different day to have your blood pressure checked again. Monitor your blood pressure at home for 1 week or longer. If you are diagnosed with hypertension, you may have other blood or imaging tests to help your health care provider understand your overall risk for other conditions. How is this treated? This condition is treated by making healthy lifestyle changes, such as eating healthy foods, exercising more, and reducing your alcohol intake. You may be referred for counseling on a healthy diet and physical activity. Your health care provider may prescribe medicine if lifestyle changes are not enough to get your blood pressure under control and if: Your systolic blood pressure is above 130. Your diastolic blood pressure is above 80. Your personal target blood pressure may vary depending on your medical conditions, your age, and other factors. Follow these instructions at home: Eating and drinking  Eat a diet that is high in fiber and potassium, and low in sodium, added sugar, and fat. An example of this eating plan is called the DASH diet. DASH stands for Dietary Approaches to Stop Hypertension. To eat this way: Eat  plenty of fresh fruits and vegetables. Try to fill one half of your plate at each meal with fruits and vegetables. Eat whole grains, such as whole-wheat pasta, brown rice, or whole-grain bread. Fill about one  fourth of your plate with whole grains. Eat or drink low-fat dairy products, such as skim milk or low-fat yogurt. Avoid fatty cuts of meat, processed or cured meats, and poultry with skin. Fill about one fourth of your plate with lean proteins, such as fish, chicken without skin, beans, eggs, or tofu. Avoid pre-made and processed foods. These tend to be higher in sodium, added sugar, and fat. Reduce your daily sodium intake. Many people with hypertension should eat less than 1,500 mg of sodium a day. Do not drink alcohol if: Your health care provider tells you not to drink. You are pregnant, may be pregnant, or are planning to become pregnant. If you drink alcohol: Limit how much you have to: 0-1 drink a day for women. 0-2 drinks a day for men. Know how much alcohol is in your drink. In the [place] one drink equals one 12 oz bottle of beer (355 mL), one 5 oz glass of wine (148 mL), or one 1 oz glass of hard liquor (44 mL). Lifestyle  Work with your health care provider to maintain a healthy body weight or to lose weight. Ask what an ideal weight is for you. Get at least 30 minutes of exercise that causes your heart to beat faster (aerobic exercise) most days of the week. Activities may include walking, swimming, or biking. Include exercise to strengthen your muscles (resistance exercise), such as Pilates or lifting weights, as part of your weekly exercise routine. Try to do these types of exercises for 30 minutes at least 3 days a week. Do not use any products that contain nicotine or tobacco. These products include cigarettes, chewing tobacco, and vaping devices, such as e-cigarettes. If you need help quitting, ask your health care provider. Monitor your blood pressure at home as told by your health care provider. Keep all follow-up visits. This is important. Medicines Take over-the-counter and prescription medicines only as told by your health care provider. Follow directions carefully. Blood  pressure medicines must be taken as prescribed. Do not skip doses of blood pressure medicine. Doing this puts you at risk for problems and can make the medicine less effective. Ask your health care provider about side effects or reactions to medicines that you should watch for. Contact a health care provider if you: Think you are having a reaction to a medicine you are taking. Have headaches that keep coming back (recurring). Feel dizzy. Have swelling in your ankles. Have trouble with your vision. Get help right away if you: Develop a severe headache or confusion. Have unusual weakness or numbness. Feel faint. Have severe pain in your chest or abdomen. Vomit repeatedly. Have trouble breathing. These symptoms may be an emergency. Get help right away. Call 911. Do not wait to see if the symptoms will go away. Do not drive yourself to the hospital. Summary Hypertension is when the force of blood pumping through your arteries is too strong. If this condition is not controlled, it may put you at risk for serious complications. Your personal target blood pressure may vary depending on your medical conditions, your age, and other factors. For most people, a normal blood pressure is less than 120/80. Hypertension is treated with lifestyle changes, medicines, or a combination of both. Lifestyle changes include losing weight, eating a healthy,  low-sodium diet, exercising more, and limiting alcohol. This information is not intended to replace advice given to you by your health care provider. Make sure you discuss any questions you have with your health care provider. Document Revised: 02/14/2021 Document Reviewed: 02/14/2021 Elsevier Patient Education  2024 [employer].

## 2024-03-11 NOTE — Progress Notes (Addendum)
 Subjective:  Patient ID: Jenna Stephens, female    DOB: [birth year]  Age: 48 y.o. MRN: [id number]  CC: Hypertension (Patient was seen in the ED for a hypertension crisis her BP was 211/118. ), Gastroesophageal Reflux, Diabetes, and Hyperlipidemia   HPI Jenna Stephens presents for f/up ---  Discussed the use of AI scribe software for clinical note transcription with the patient, who gave verbal consent to proceed.  History of Present Illness Jenna Stephens is a 48 year old female with asthma and hypertension who presents with persistent headaches and breathing difficulties.  She experiences headaches daily or every other day, typically starting three to four hours into her workday, which she attributes to prolonged computer use. Previously prescribed migraine medication at the hospital was effective when taken with Benadryl , but she has since run out of the medication.  She continues to experience difficulty sleeping, which she attributes to her asthma symptoms. She describes episodes of chest tightness and coughing, with wheezing that worsens at night, impacting her ability to sleep. Her asthma symptoms are variable, with some days being symptom-free and others marked by significant wheezing.  She smokes about one cigarette per day and has significantly reduced her alcohol intake, having only two or three drinks since her husband stopped drinking.  She has not had a mammogram or eye exam in the past year due to work-related scheduling conflicts. She plans to schedule these appointments now that she has intermittent time off from work.  She is currently taking sertraline , nadolol , rosuvastatin , and alprazolam , and has stopped taking triamterene . She takes two alprazolam  tablets at a time and experiences leg tingling at night, as well as occasional muscle twitching or cramping. She is awaiting a prescription refill for her acid reflux medication.  No side effects from her current medications.  She reports occasional muscle twitching or cramping, particularly at night.     Outpatient Medications Prior to Visit  Medication Sig Dispense Refill   albuterol  (VENTOLIN  HFA) 108 (90 Base) MCG/ACT inhaler Inhale 1-2 puffs into the lungs every 6 (six) hours as needed for wheezing or shortness of breath. (Patient taking differently: Inhale 2 puffs into the lungs as needed for wheezing or shortness of breath.) 8 g 0   ALPRAZolam  (XANAX ) 1 MG tablet Take 1 tablet (1 mg total) by mouth 2 (two) times daily as needed for anxiety. 60 tablet 1   [facility] ASPIRIN  LOW DOSE 81 MG tablet TAKE 1 TABLET (81 MG TOTAL) BY MOUTH DAILY. SWALLOW WHOLE. (Patient taking differently: Take 81 mg by mouth daily in the afternoon. SWALLOW WHOLE.) 90 tablet 1   ipratropium-albuterol  (DUONEB) 0.5-2.5 (3) MG/3ML SOLN Take 3 mLs by nebulization every 4 (four) hours as needed. (Patient taking differently: Take 3 mLs by nebulization as needed (wheezing/SOB).) 360 mL 1   nadolol  (CORGARD ) 20 MG tablet Take 1 tablet (20 mg total) by mouth daily. 90 tablet 0   rosuvastatin  (CRESTOR ) 20 MG tablet Take 1 tablet (20 mg total) by mouth daily. 90 tablet 0   Fluticasone -Umeclidin-Vilant (TRELEGY ELLIPTA ) 100-62.5-25 MCG/ACT AEPB Inhale 1 puff into the lungs daily. 120 each 1   omeprazole  (PRILOSEC) 40 MG capsule Take 1 capsule (40 mg total) by mouth daily. 90 capsule 1   potassium chloride  (KLOR-CON ) 10 MEQ tablet TAKE 1 TABLET BY MOUTH 2 TIMES DAILY. 180 tablet 0   sertraline  (ZOLOFT ) 50 MG tablet Take 1 tablet (50 mg total) by mouth daily. 90 tablet 0   Ferric Maltol  (ACCRUFER ) 30  MG CAPS Take 1 capsule (30 mg total) by mouth in the morning and at bedtime. (Patient not taking: Reported on 03/11/2024) 180 capsule 0   metoCLOPramide  (REGLAN ) 5 MG tablet Take 1 tablet (5 mg total) by mouth 3 (three) times daily as needed for up to 8 doses (headache). (Patient not taking: Reported on 03/11/2024) 8 tablet 0   triamterene -hydrochlorothiazide   (DYAZIDE ) 37.5-25 MG capsule Take 1 each (1 capsule total) by mouth daily. (Patient not taking: Reported on 03/11/2024) 90 capsule 0   No facility-administered medications prior to visit.    ROS Review of Systems  Constitutional:  Positive for fatigue. Negative for appetite change, chills, diaphoresis and fever.  HENT: Negative.    Eyes: Negative.   Respiratory:  Positive for wheezing. Negative for cough, chest tightness and shortness of breath.   Cardiovascular:  Negative for chest pain, palpitations and leg swelling.  Gastrointestinal: Negative.  Negative for abdominal pain, constipation, diarrhea, nausea and vomiting.  Genitourinary:  Negative for difficulty urinating, dysuria and hematuria.  Musculoskeletal: Negative.  Negative for arthralgias and myalgias.  Skin: Negative.  Negative for rash.  Neurological:  Positive for headaches. Negative for dizziness, speech difficulty, weakness, light-headedness and numbness.  Hematological:  Negative for adenopathy. Does not bruise/bleed easily.  Psychiatric/Behavioral:  Positive for dysphoric mood and sleep disturbance. Negative for behavioral problems, confusion, decreased concentration and suicidal ideas. The patient is nervous/anxious. The patient is not hyperactive.     Objective:  BP (!) 160/90 (BP Location: Left Arm, Patient Position: Sitting, Cuff Size: Large)   Pulse 95   Temp 98.1 F (36.7 C) (Oral)   Resp 16   Ht 5' 7 (1.702 m)   Wt 279 lb (126.6 kg)   LMP 02/16/2024 (Exact Date)   SpO2 96%   BMI 43.70 kg/m   BP Readings from Last 3 Encounters:  03/11/24 (!) 160/90  02/25/24 (!) 160/101  02/25/24 (!) 184/122    Wt Readings from Last 3 Encounters:  03/11/24 279 lb (126.6 kg)  02/25/24 282 lb (127.9 kg)  02/25/24 282 lb 1.9 oz (128 kg)    Physical Exam Constitutional:      General: She is not in acute distress.    Appearance: She is obese. She is not toxic-appearing or diaphoretic.  HENT:     Mouth/Throat:      Mouth: Mucous membranes are moist.  Eyes:     General: No scleral icterus.    Conjunctiva/sclera: Conjunctivae normal.  Cardiovascular:     Rate and Rhythm: Normal rate and regular rhythm.     Heart sounds: No murmur heard.    No friction rub. No gallop.  Pulmonary:     Breath sounds: No stridor. No wheezing, rhonchi or rales.  Abdominal:     General: Abdomen is protuberant. Bowel sounds are normal. There is no distension.     Palpations: Abdomen is soft. There is no hepatomegaly, splenomegaly or mass.     Tenderness: There is no abdominal tenderness. There is no guarding.  Musculoskeletal:        General: Normal range of motion.     Cervical back: Neck supple.     Right lower leg: No edema.     Left lower leg: No edema.  Lymphadenopathy:     Cervical: No cervical adenopathy.  Skin:    General: Skin is warm and dry.  Neurological:     General: No focal deficit present.     Mental Status: She is alert.  Psychiatric:  Attention and Perception: She is inattentive.        Mood and Affect: Mood is anxious and depressed. Affect is tearful.        Speech: She is communicative. Speech is not rapid and pressured, delayed, slurred or tangential.        Behavior: Behavior normal. Behavior is cooperative.        Thought Content: Thought content normal. Thought content is not paranoid or delusional. Thought content does not include homicidal or suicidal ideation. Thought content does not include homicidal or suicidal plan.        Cognition and Memory: Cognition normal.     Lab Results  Component Value Date   WBC 10.4 02/25/2024   HGB 10.0 (L) 02/25/2024   HCT 36.3 02/25/2024   PLT 341 02/25/2024   GLUCOSE 139 (H) 02/25/2024   CHOL 266 (H) 01/21/2024   TRIG 150.0 (H) 01/21/2024   HDL 50.50 01/21/2024   LDLCALC 185 (H) 01/21/2024   ALT 9 02/25/2024   AST 15 02/25/2024   NA 140 02/25/2024   K 3.4 (L) 02/25/2024   CL 103 02/25/2024   CREATININE 0.63 02/25/2024   BUN 9  02/25/2024   CO2 25 02/25/2024   TSH 0.50 01/21/2024   INR 0.99 11/28/2017   HGBA1C 7.2 (H) 01/21/2024   MICROALBUR 1.2 01/21/2024    MR BRAIN WO CONTRAST Result Date: 02/25/2024 CLINICAL DATA:  Headache, left-sided tingling and facial droop EXAM: MRI HEAD WITHOUT CONTRAST TECHNIQUE: Multiplanar, multiecho pulse sequences of the brain and surrounding structures were obtained without intravenous contrast. COMPARISON:  None Available. FINDINGS: MRI brain: The brain volume is normal. There are a few small foci of T2 hyperintensity in the cerebral white matter. These do not have restricted diffusion. There is no acute or chronic infarct. The ventricles are normal. No mass lesion. There are normal flow signals in the carotid arteries and basilar artery. No significant bone marrow signal abnormality. No significant abnormality in the paranasal sinuses or soft tissues. IMPRESSION: No acute infarct or other significant abnormality Electronically Signed   By: Nancyann Burns M.D.   On: 02/25/2024 14:39   CT Head Wo Contrast Result Date: 02/25/2024 EXAM: CT HEAD WITHOUT CONTRAST 02/25/2024 12:49:11 PM TECHNIQUE: CT of the head was performed without the administration of intravenous contrast. Automated exposure control, iterative reconstruction, and/or weight based adjustment of the mA/kV was utilized to reduce the radiation dose to as low as reasonably achievable. COMPARISON: Head CT 11/29/2007. CLINICAL HISTORY: 48 year old female. Headache, increasing frequency or severity. FINDINGS: BRAIN AND VENTRICLES: No acute hemorrhage. No evidence of acute infarct. No hydrocephalus. No extra-axial collection. No mass effect or midline shift. ORBITS: No acute abnormality. SINUSES: No acute abnormality. SOFT TISSUES AND SKULL: No acute soft tissue abnormality. No skull fracture. IMPRESSION: 1. Normal for age non-contrast head CT. Electronically signed by: Helayne Hurst MD 02/25/2024 01:17 PM EST RP Workstation: [id number]   DG  Chest Port 1 View Result Date: 02/25/2024 EXAM: 1 VIEW(S) XRAY OF THE CHEST 02/25/2024 11:45:00 AM COMPARISON: None available. CLINICAL HISTORY: Chest pain FINDINGS: LUNGS AND PLEURA: No focal pulmonary opacity. No pulmonary edema. No pleural effusion. No pneumothorax. HEART AND MEDIASTINUM: No acute abnormality of the cardiac and mediastinal silhouettes. BONES AND SOFT TISSUES: No acute osseous abnormality. IMPRESSION: 1. No acute cardiopulmonary process. Electronically signed by: Ryan Salvage MD 02/25/2024 12:48 PM EST RP Workstation: [id number]    Assessment & Plan:  Type 2 diabetes mellitus without complication, without long-term current use  of insulin  (HCC) -     HM Diabetes Foot Exam -     Hepatitis B surface antibody,quantitative; Future -     Mounjaro ; Inject 2.5 mg into the skin once a week.  Dispense: 2 mL; Refill: 0 -     Ambulatory referral to Ophthalmology -     AMB Referral [facility] Care Management  Severe persistent asthma with acute exacerbation (HCC) -     Trelegy Ellipta ; Inhale 1 puff into the lungs daily.  Dispense: 120 each; Refill: 1  Gastroesophageal reflux disease with esophagitis without hemorrhage -     Omeprazole ; Take 1 capsule (40 mg total) by mouth daily.  Dispense: 90 capsule; Refill: 1  Malignant hypertension- Will try to achieve better BP control. -     Spironolactone ; Take 1 tablet (25 mg total) by mouth daily.  Dispense: 90 tablet; Refill: 0 -     AMB Referral [facility]BCI Care Management -     amLODIPine  Besylate; Take 1 tablet (5 mg total) by mouth daily.  Dispense: 90 tablet; Refill: 0  Diuretic-induced hypokalemia -     Spironolactone ; Take 1 tablet (25 mg total) by mouth daily.  Dispense: 90 tablet; Refill: 0  GAD (generalized anxiety disorder) -     Sertraline  HCl; Take 1 tablet (100 mg total) by mouth daily.  Dispense: 90 tablet; Refill: 0     Follow-up: Return in about 3 months (around 06/11/2024).  Debby Molt, MD

## 2024-03-13 ENCOUNTER — Encounter (HOSPITAL_COMMUNITY): Payer: Self-pay | Admitting: Internal Medicine

## 2024-03-13 ENCOUNTER — Other Ambulatory Visit: Payer: Self-pay | Admitting: Internal Medicine

## 2024-03-13 ENCOUNTER — Telehealth (HOSPITAL_COMMUNITY): Payer: Self-pay | Admitting: *Deleted

## 2024-03-13 ENCOUNTER — Encounter: Payer: Self-pay | Admitting: Internal Medicine

## 2024-03-13 ENCOUNTER — Ambulatory Visit (HOSPITAL_COMMUNITY)
Admission: RE | Admit: 2024-03-13 | Discharge: 2024-03-13 | Disposition: A | Discharge status: Home / Self Care | Source: Ambulatory Visit | Attending: Cardiology | Admitting: Cardiology

## 2024-03-13 DIAGNOSIS — R9431 Abnormal electrocardiogram [ECG] [EKG]: Secondary | ICD-10-CM

## 2024-03-13 DIAGNOSIS — R0789 Other chest pain: Secondary | ICD-10-CM

## 2024-03-13 LAB — ECHOCARDIOGRAM COMPLETE
Area-P 1/2: 3.88 cm2
S' Lateral: 3.3 cm

## 2024-03-13 NOTE — Telephone Encounter (Signed)
 Patient was here today for an echocardiogram and a POET.  During the echocardiogram the patient was noted to have an elevated blood pressure of 180/121 using an auto cuff.  Patient was brought for her POET but manual blood pressure reading was 200/120.  The patient reported having not picked her new medications up for her blood pressure issued.  We reschedule her appointment three weeks out to give her medication time to work.

## 2024-03-14 ENCOUNTER — Other Ambulatory Visit: Payer: Self-pay | Admitting: Internal Medicine

## 2024-03-14 DIAGNOSIS — E119 Type 2 diabetes mellitus without complications: Secondary | ICD-10-CM

## 2024-03-15 ENCOUNTER — Other Ambulatory Visit: Payer: Self-pay | Admitting: Internal Medicine

## 2024-03-15 DIAGNOSIS — E876 Hypokalemia: Secondary | ICD-10-CM | POA: Diagnosis not present

## 2024-03-15 DIAGNOSIS — R0789 Other chest pain: Secondary | ICD-10-CM

## 2024-03-15 DIAGNOSIS — K2100 Gastro-esophageal reflux disease with esophagitis, without bleeding: Secondary | ICD-10-CM

## 2024-03-15 DIAGNOSIS — R9431 Abnormal electrocardiogram [ECG] [EKG]: Secondary | ICD-10-CM

## 2024-03-15 DIAGNOSIS — E119 Type 2 diabetes mellitus without complications: Secondary | ICD-10-CM

## 2024-03-15 MED ORDER — METFORMIN HCL ER 750 MG PO TB24: 750.0000 mg | ORAL_TABLET | Freq: Every day | ORAL | 0 refills | Status: DC

## 2024-03-15 MED ORDER — VOQUEZNA 10 MG PO TABS: 1.0000 | ORAL_TABLET | Freq: Every day | ORAL | 1 refills | Status: DC

## 2024-03-16 ENCOUNTER — Telehealth: Payer: Self-pay | Admitting: *Deleted

## 2024-03-16 NOTE — Progress Notes (Signed)
 Care Guide Pharmacy Note  03/16/2024 Name: Jenna Stephens MRN: [id number] DOB: 1975/11/08  Referred By: Joshua Debby CROME, MD Reason for referral: Complex Care Management (Outreach to schedule referral with pharmacist )   Jenna Stephens is a 48 y.o. year old female who is a primary care patient of Joshua Debby CROME, MD.  Jenna Stephens Finder was referred to the pharmacist for assistance related to: HTN and DMII  Successful contact was made with the patient to discuss pharmacy services including being ready for the pharmacist to call at least 5 minutes before the scheduled appointment time and to have medication bottles and any blood pressure readings ready for review. The patient agreed to meet with the pharmacist via telephone visit on 03/26/2024  Jenna Stephens, CMA [place]  [facility] Guide Direct Dial: [phone]  Fax: [phone] Website: [email or web address]

## 2024-03-16 NOTE — Telephone Encounter (Signed)
 Please encourage the patient to start antihypertensive medications and follow-up with PCP for further titration. It is very important that her blood pressures are better controlled to prevent adverse effects of cardiomyopathy, chronic kidney disease, stroke. With such high numbers agree with scheduling her GXT in 3 weeks.  Dr. Sawyer Mentzer

## 2024-03-17 ENCOUNTER — Telehealth: Payer: Self-pay

## 2024-03-17 ENCOUNTER — Ambulatory Visit

## 2024-03-17 ENCOUNTER — Inpatient Hospital Stay: Admitting: Internal Medicine

## 2024-03-17 VITALS — BP 146/93 | HR 70 | Temp 97.7°F | Resp 16 | Ht 68.0 in | Wt 275.6 lb

## 2024-03-17 DIAGNOSIS — D500 Iron deficiency anemia secondary to blood loss (chronic): Secondary | ICD-10-CM | POA: Diagnosis not present

## 2024-03-17 MED ORDER — IRON SUCROSE 200 MG IVPB - SIMPLE MED
200.0000 mg | Freq: Once | Status: AC
  Administered 2024-03-17: 200 mg via INTRAVENOUS
  Filled 2024-03-17: qty 110

## 2024-03-17 NOTE — Progress Notes (Signed)
 Diagnosis: Iron  Deficiency Anemia  Provider:  Mannam, Praveen MD  Procedure: IV Infusion  IV Type: Peripheral, IV Location: R Antecubital  Venofer  (Iron  Sucrose), Dose: 200 mg  Infusion Start Time: 1115  Infusion Stop Time: 1130  Post Infusion IV Care: Observation period completed and Peripheral IV Discontinued  Discharge: Condition: Good, Destination: Home . AVS Declined  Performed by:  Maximiano JONELLE Pouch, LPN

## 2024-03-17 NOTE — Telephone Encounter (Signed)
 Copied from CRM #[id number]. Topic: Clinical - Medical Advice >> Mar 16, 2024  5:48 PM Taleah C wrote: Reason for CRM: [facility] called in because they could not reach the patient in regards to a rx that was sent for  VOQUEZNA . The pharmacist asked for someone to inform the patient that they tried to contact her if she calls the office. Please advise.

## 2024-03-24 ENCOUNTER — Ambulatory Visit (INDEPENDENT_AMBULATORY_CARE_PROVIDER_SITE_OTHER)

## 2024-03-24 VITALS — BP 157/94 | HR 59 | Temp 98.0°F | Resp 16 | Ht 68.0 in | Wt 273.8 lb

## 2024-03-24 DIAGNOSIS — D500 Iron deficiency anemia secondary to blood loss (chronic): Secondary | ICD-10-CM

## 2024-03-24 MED ORDER — IRON SUCROSE 200 MG IVPB - SIMPLE MED
200.0000 mg | Freq: Once | Status: AC
  Administered 2024-03-24: 200 mg via INTRAVENOUS
  Filled 2024-03-24: qty 110

## 2024-03-24 NOTE — Progress Notes (Signed)
 Diagnosis:  Iron  Deficiency Anemia  Provider:  Mannam, Praveen MD  Procedure: IV Infusion  IV Type: Peripheral, IV Location: L Antecubital   Venofer  (Iron  Sucrose), Dose: 200 mg  Infusion Start Time: 1118  Infusion Stop Time: 1134  Post Infusion IV Care: Patient declined observation and Peripheral IV Discontinued  Discharge: Condition: Good, Destination: Home . AVS Declined  Performed by:  Maximiano JONELLE Pouch, LPN

## 2024-03-25 ENCOUNTER — Ambulatory Visit: Admission: EM | Admit: 2024-03-25 | Discharge: 2024-03-25 | Disposition: A | Discharge status: Home / Self Care

## 2024-03-25 DIAGNOSIS — J069 Acute upper respiratory infection, unspecified: Secondary | ICD-10-CM | POA: Diagnosis not present

## 2024-03-25 LAB — POC COVID19/FLU A&B COMBO
Covid Antigen, POC: NEGATIVE
Influenza A Antigen, POC: NEGATIVE
Influenza B Antigen, POC: NEGATIVE

## 2024-03-25 NOTE — ED Provider Notes (Signed)
 [facility]    CSN: [id number] Arrival date & time: 03/25/24  9066      History   Chief Complaint Chief Complaint  Patient presents with   Headache    HPI Jenna GILLOTT is a 48 y.o. female.   Patient presents today due to 6 days of nasal congestion and cough, headache, body aches, and chills with no relief from over-the-counter cold medications.  Patient denies fever.  Patient states that she experience 2 episodes of vomiting yesterday.  Patient denies known sick contacts.  The history is provided by the patient.  Headache   Past Medical History:  Diagnosis Date   Acute respiratory failure (HCC) 12/29/2017   Anxiety    Asthma    Gestational diabetes    Hypertension    Iron  deficiency anemia    Obesity     Patient Active Problem List   Diagnosis Date Noted   Diuretic-induced hypokalemia 03/11/2024   Screening mammogram for breast cancer 01/22/2024   Abnormal EKG 01/22/2024   Screening for colon cancer 01/22/2024   Need for immunization against influenza 01/21/2024   Prolonged QT interval 01/21/2024   Class 3 obesity (HCC) 11/06/2022   Iron  deficiency anemia due to chronic blood loss 06/28/2021   Gastroesophageal reflux disease with esophagitis without hemorrhage 06/28/2021   Encounter for general adult medical examination with abnormal findings 06/27/2021   Malignant hypertension 06/27/2021   Type 2 diabetes mellitus without complication, without long-term current use of insulin  (HCC) 06/27/2021   Hyperlipidemia LDL goal <100 06/27/2021   GAD (generalized anxiety disorder) 06/27/2021   Visit for screening mammogram 06/27/2021   Screening for cervical cancer 06/27/2021   Loud snoring 06/27/2021   Need for vaccination 06/27/2021   Hypokalemia 06/09/2018   Severe persistent asthma without complication (HCC) 02/03/2018   Obesity 12/29/2017    Past Surgical History:  Procedure Laterality Date   TONSILLECTOMY     TUBAL LIGATION N/A 10/07/2018    Procedure: POST PARTUM TUBAL LIGATION;  Surgeon: Barbra Lang PARAS, DO;  Location: [facility];  Service: Gynecology;  Laterality: N/A;    OB History     Gravida  6   Para  2   Term  2   Preterm  0   AB  3   Living  2      SAB  2   IAB  1   Ectopic  0   Multiple      Live Births  2            Home Medications    Prior to Admission medications   Medication Sig Start Date End Date Taking? Authorizing Provider  albuterol  (VENTOLIN  HFA) 108 (90 Base) MCG/ACT inhaler Inhale 1-2 puffs into the lungs every 6 (six) hours as needed for wheezing or shortness of breath. Patient taking differently: Inhale 2 puffs into the lungs as needed for wheezing or shortness of breath. 04/11/21   Billy Asberry FALCON, PA-C  ALPRAZolam  (XANAX ) 1 MG tablet Take 1 tablet (1 mg total) by mouth 2 (two) times daily as needed for anxiety. 12/26/23   Plotnikov, Karlynn GAILS, MD  amLODipine  (NORVASC ) 5 MG tablet Take 1 tablet (5 mg total) by mouth daily. 03/11/24   Joshua Debby CROME, MD  [facility] ASPIRIN  LOW DOSE 81 MG tablet TAKE 1 TABLET (81 MG TOTAL) BY MOUTH DAILY. SWALLOW WHOLE. Patient taking differently: Take 81 mg by mouth daily in the afternoon. SWALLOW WHOLE. 03/30/23   Joshua Debby CROME, MD  Ferric Maltol  (ACCRUFER ) 30 MG CAPS Take 1 capsule (30 mg total) by mouth in the morning and at bedtime. Patient not taking: Reported on 03/11/2024 01/21/24   Joshua Debby CROME, MD  Fluticasone -Umeclidin-Vilant (TRELEGY ELLIPTA ) 100-62.5-25 MCG/ACT AEPB Inhale 1 puff into the lungs daily. 03/11/24   Joshua Debby CROME, MD  ipratropium-albuterol  (DUONEB) 0.5-2.5 (3) MG/3ML SOLN Take 3 mLs by nebulization every 4 (four) hours as needed. Patient taking differently: Take 3 mLs by nebulization as needed (wheezing/SOB). 02/06/19   Hope Almarie ORN, NP  metFORMIN  (GLUCOPHAGE -XR) 750 MG 24 hr tablet Take 1 tablet (750 mg total) by mouth daily with breakfast. 03/15/24   Joshua Debby CROME, MD  nadolol  (CORGARD ) 20 MG tablet Take 1 tablet  (20 mg total) by mouth daily. 01/21/24   Joshua Debby CROME, MD  rosuvastatin  (CRESTOR ) 20 MG tablet Take 1 tablet (20 mg total) by mouth daily. 01/21/24   Joshua Debby CROME, MD  sertraline  (ZOLOFT ) 100 MG tablet Take 1 tablet (100 mg total) by mouth daily. 03/11/24   Joshua Debby CROME, MD  spironolactone  (ALDACTONE ) 25 MG tablet Take 1 tablet (25 mg total) by mouth daily. 03/11/24   Joshua Debby CROME, MD  Vonoprazan Fumarate  (VOQUEZNA ) 10 MG TABS Take 1 tablet by mouth daily. 03/15/24   Joshua Debby CROME, MD    Family History Family History  Problem Relation Age of Onset   Diabetes Mother    Hypertension Mother    Hypertension Father    COPD Father     Social History Social History   Tobacco Use   Smoking status: Some Days    Current packs/day: 0.00    Types: Cigarettes    Last attempt to quit: 01/06/2018    Years since quitting: 6.2    Passive exposure: Past   Smokeless tobacco: Never  Vaping Use   Vaping status: Never Used  Substance Use Topics   Alcohol use: Yes    Alcohol/week: 2.0 standard drinks of alcohol    Types: 2 Standard drinks or equivalent per week   Drug use: Not Currently    Types: Marijuana     Allergies   Patient has no known allergies.   Review of Systems Review of Systems  Neurological:  Positive for headaches.     Physical Exam Triage Vital Signs ED Triage Vitals  Encounter Vitals Group     BP 03/25/24 1031 (!) 162/88     Girls Systolic BP Percentile --      Girls Diastolic BP Percentile --      Boys Systolic BP Percentile --      Boys Diastolic BP Percentile --      Pulse Rate 03/25/24 1031 77     Resp 03/25/24 1031 16     Temp 03/25/24 1031 98.3 F (36.8 C)     Temp Source 03/25/24 1031 Oral     SpO2 03/25/24 1031 97 %     Weight --      Height --      Head Circumference --      Peak Flow --      Pain Score 03/25/24 1030 7     Pain Loc --      Pain Education --      Exclude from Growth Chart --    No data found.  Updated Vital  Signs BP (!) 162/88 (BP Location: Left Arm)   Pulse 77   Temp 98.3 F (36.8 C) (Oral)   Resp 16   LMP 03/22/2024 (  Approximate)   SpO2 97%   Visual Acuity Right Eye Distance:   Left Eye Distance:   Bilateral Distance:    Right Eye Near:   Left Eye Near:    Bilateral Near:     Physical Exam Vitals and nursing note reviewed.  Constitutional:      General: She is not in acute distress.    Appearance: She is well-developed. She is ill-appearing. She is not toxic-appearing or diaphoretic.  HENT:     Nose: Congestion (moderately enlarged turbinates) present. No rhinorrhea.     Mouth/Throat:     Mouth: Mucous membranes are moist.     Pharynx: Oropharynx is clear. No oropharyngeal exudate or posterior oropharyngeal erythema.  Eyes:     General: No scleral icterus. Cardiovascular:     Rate and Rhythm: Normal rate and regular rhythm.     Heart sounds: Normal heart sounds.  Pulmonary:     Effort: Pulmonary effort is normal. No respiratory distress.     Breath sounds: Normal breath sounds. No wheezing or rhonchi.  Skin:    General: Skin is warm.  Neurological:     Mental Status: She is alert and oriented to person, place, and time.  Psychiatric:        Mood and Affect: Mood normal.        Behavior: Behavior normal.      UC Treatments / Results  Labs (all labs ordered are listed, but only abnormal results are displayed) Labs Reviewed  POC COVID19/FLU A&B COMBO - Normal    EKG   Radiology No results found.  Procedures Procedures (including critical care time)  Medications Ordered in UC Medications - No data to display  Initial Impression / Assessment and Plan / UC Course  I have reviewed the triage vital signs and the nursing notes.  Pertinent labs & imaging results that were available during my care of the patient were reviewed by me and considered in my medical decision making (see chart for details).    Final Clinical Impressions(s) / UC Diagnoses   Final  diagnoses:  Viral URI     Discharge Instructions      Pt is neg for covid and flu.  You been diagnosed with a viral illness today. -Viruses have to run their course and medicines that are prescribed are meant to help with symptoms. - With viruses usually feel poorly from 3 to 7 days with cough being the last symptoms to resolve.  -Cough can linger from days to weeks.  Antibiotics are not effective for viruses. -If your cough lasts more than 2 weeks and you are coughing so hard that you are vomiting or feel like you could pass out we need to follow-up with PCP for further testing and evaluation. -Rest, increase water  intake, may use pseudoephedrine for nasal congestion, Delsym (dextromethorphan) or honey as needed for cough, and ibuprofen  and/or Tylenol  as directed on packaging for pain and fever. -If you have hypertension you should take Coricidin or other OTC meds approved for people with high blood pressure. -You may use a spoonful of honey every 4-6 hours as needed for throat pain and cough. -Warm tea with honey and lemon are helpful for soothe throat as well.  Chloraseptic and Cepacol make a throat lozenge with numbing medication, can be purchased over-the-counter. -May also use Flonase or sinus rinse for sinus pressure or nasal congestion.  Be sure to use distilled bottled water  for sinus rinses. -May use coolmist humidifier to open up nasal passages -  May elevate head to assist with postnasal drainage. -If you feel poorly (fever, fatigue, shortness of breath, nausea, etc.) for more than 10 days to be sure to follow-up with PCP or in clinic for further evaluation and additional treatments. If you experience chest pain with shortness of breath or pulse oxygen  less than 95% you should report to the ER.     ED Prescriptions   None    PDMP not reviewed this encounter.   Andra Corean BROCKS, PA-C 03/25/24 1347

## 2024-03-25 NOTE — Discharge Instructions (Signed)
 Pt is neg for covid and flu  You been diagnosed with a viral illness today. -Viruses have to run their course and medicines that are prescribed are meant to help with symptoms. - With viruses usually feel poorly from 3 to 7 days with cough being the last symptoms to resolve.  -Cough can linger from days to weeks.  Antibiotics are not effective for viruses. -If your cough lasts more than 2 weeks and you are coughing so hard that you are vomiting or feel like you could pass out we need to follow-up with PCP for further testing and evaluation. -Rest, increase water intake, may use pseudoephedrine for nasal congestion, Delsym (dextromethorphan) or honey as needed for cough, and ibuprofen  and/or Tylenol as directed on packaging for pain and fever. -If you have hypertension you should take Coricidin or other OTC meds approved for people with high blood pressure. -You may use a spoonful of honey every 4-6 hours as needed for throat pain and cough. -Warm tea with honey and lemon are helpful for soothe throat as well.  Chloraseptic and Cepacol make a throat lozenge with numbing medication, can be purchased over-the-counter. -May also use Flonase or sinus rinse for sinus pressure or nasal congestion.  Be sure to use distilled bottled water for sinus rinses. -May use coolmist humidifier to open up nasal passages -May elevate head to assist with postnasal drainage. -If you feel poorly (fever, fatigue, shortness of breath, nausea, etc.) for more than 10 days to be sure to follow-up with PCP or in clinic for further evaluation and additional treatments. If you experience chest pain with shortness of breath or pulse oxygen less than 95% you should report to the ER.

## 2024-03-25 NOTE — Telephone Encounter (Signed)
 Patient has been made aware and advised me that she will call them when she gets paid.

## 2024-03-25 NOTE — ED Triage Notes (Signed)
 Pt states headache,body aches.vomiting for the past 6 days. States she has been taking Nyquil,tylenol  and a headache medication at home with no relief.

## 2024-03-26 ENCOUNTER — Other Ambulatory Visit

## 2024-03-29 ENCOUNTER — Other Ambulatory Visit: Payer: Self-pay | Admitting: Internal Medicine

## 2024-03-31 ENCOUNTER — Ambulatory Visit

## 2024-03-31 ENCOUNTER — Telehealth: Payer: Self-pay | Admitting: *Deleted

## 2024-03-31 ENCOUNTER — Other Ambulatory Visit: Payer: Self-pay | Admitting: Internal Medicine

## 2024-03-31 ENCOUNTER — Encounter: Payer: Self-pay | Admitting: *Deleted

## 2024-03-31 ENCOUNTER — Other Ambulatory Visit

## 2024-03-31 VITALS — BP 129/83 | HR 71 | Temp 98.0°F | Resp 16 | Ht 68.0 in | Wt 271.6 lb

## 2024-03-31 DIAGNOSIS — J4551 Severe persistent asthma with (acute) exacerbation: Secondary | ICD-10-CM

## 2024-03-31 DIAGNOSIS — D500 Iron deficiency anemia secondary to blood loss (chronic): Secondary | ICD-10-CM

## 2024-03-31 MED ORDER — SODIUM CHLORIDE 0.9 % IV SOLN
200.0000 mg | Freq: Once | INTRAVENOUS | Status: AC
  Administered 2024-03-31: 200 mg via INTRAVENOUS
  Filled 2024-03-31: qty 10

## 2024-03-31 MED ORDER — BUDESONIDE-FORMOTEROL FUMARATE 80-4.5 MCG/ACT IN AERO: INHALATION_SPRAY | RESPIRATORY_TRACT | 5 refills | Status: DC

## 2024-03-31 MED ORDER — IRON SUCROSE 200 MG IVPB - SIMPLE MED: 200.0000 mg | Freq: Once | Status: DC

## 2024-03-31 MED ORDER — PANTOPRAZOLE SODIUM 40 MG PO TBEC: 40.0000 mg | DELAYED_RELEASE_TABLET | Freq: Every day | ORAL | 3 refills | Status: AC

## 2024-03-31 MED ORDER — TIRZEPATIDE 5 MG/0.5ML ~~LOC~~ SOAJ: 5.0000 mg | SUBCUTANEOUS | 0 refills | Status: DC

## 2024-03-31 NOTE — Progress Notes (Unsigned)
 03/31/2024 Name: Jenna Stephens MRN: [id number] DOB: Sep 17, 1975  No chief complaint on file.   Jenna Stephens is a 48 y.o. year old female who presented for a telephone visit.   They were referred to the pharmacist by their PCP for assistance in managing diabetes, hypertension, medication access, and asthma.   Recheck after 1st of the January for Mounjaro  cost. Look into if pharmacy  - Mounjaro  5 mg weekly  Try pantoprazole  40 mg daily -   BP 136/88 -  Stopped spironolactone  - cramping and stomach pain, stopped once stopped medication  Look into airsupra  combo  Subjective:  Care Team: Primary Care Provider: Joshua Debby CROME, MD ; Next Scheduled Visit: *** {careteamprovider:27366}  Medication Access/Adherence  Current Pharmacy:  [facility] [place] - [id number] [phone] [place] [address] Phone: [phone] Fax: [phone]  [id number] [place] [place] - [address] [place] [address] Phone: [phone] Fax: [phone]  [id number] [place], [place] - [address] AT [address] [place] [address] Phone: [phone] Fax: [phone]  [id number] [place],  - [address] [place] [address] Phone: [phone] Fax: [phone]   Patient reports affordability concerns with their medications: {YES/NO:21197} Patient reports access/transportation concerns to their pharmacy: {YES/NO:21197} Patient reports adherence concerns with their medications:  {YES/NO:21197} ***   {Pharmacy S/O Choices:26420}   Objective:  Lab Results  Component Value Date   HGBA1C 7.2 (H) 01/21/2024    Lab Results  Component Value Date   CREATININE 0.63 02/25/2024   BUN 9 02/25/2024   NA 140 02/25/2024   K 3.4 (L) 02/25/2024   CL 103 02/25/2024   CO2 25 02/25/2024    Lab Results  Component Value Date   CHOL  266 (H) 01/21/2024   HDL 50.50 01/21/2024   LDLCALC 185 (H) 01/21/2024   TRIG 150.0 (H) 01/21/2024   CHOLHDL 5 01/21/2024    Medications Reviewed Today     Reviewed by Merceda Lela SAUNDERS, RPH (Pharmacist) on 03/31/24 at 1618  Med List Status: <None>   Medication Order Taking? Sig Documenting Provider Last Dose Status Informant  albuterol  (VENTOLIN  HFA) 108 (90 Base) MCG/ACT inhaler [id number]  Inhale 1-2 puffs into the lungs every 6 (six) hours as needed for wheezing or shortness of breath.  Patient taking differently: Inhale 2 puffs into the lungs as needed for wheezing or shortness of breath.   Billy Asberry FALCON, PA-C  Active Self, Pharmacy Records  ALPRAZolam  (XANAX ) 1 MG tablet [id number]  TAKE 1 TABLET BY MOUTH 2 TIMES DAILY AS NEEDED FOR ANXIETY. Joshua Debby CROME, MD  Active   amLODipine  (NORVASC ) 5 MG tablet [id number] Yes Take 1 tablet (5 mg total) by mouth daily. Joshua Debby CROME, MD  Active   [facility] ASPIRIN  LOW DOSE 81 MG tablet [id number]  TAKE 1 TABLET (81 MG TOTAL) BY MOUTH DAILY. SWALLOW WHOLE.  Patient taking differently: Take 81 mg by mouth daily in the afternoon. SWALLOW WHOLE.   Joshua Debby CROME, MD  Active   Ferric Maltol  (ACCRUFER ) 30 MG CAPS [id number]  Take 1 capsule (30 mg total) by mouth in the morning and at bedtime.  Patient not taking: Reported on 03/11/2024   Joshua Debby CROME, MD  Active   Fluticasone -Umeclidin-Vilant (TRELEGY ELLIPTA ) 100-62.5-25 MCG/ACT AEPB [id number]  Inhale 1 puff into the lungs daily.  Patient not taking: Reported  on 03/31/2024   Joshua Debby CROME, MD  Active   ipratropium-albuterol  (DUONEB) 0.5-2.5 (3) MG/3ML SOLN [id number]  Take 3 mLs by nebulization every 4 (four) hours as needed.  Patient taking differently: Take 3 mLs by nebulization as needed (wheezing/SOB).   Hope Almarie ORN, NP  Active Self, Pharmacy Records  metFORMIN  (GLUCOPHAGE -XR) 750 MG 24 hr tablet [id number]  Take 1 tablet (750 mg total) by mouth daily with breakfast.  Patient not  taking: Reported on 03/31/2024   Joshua Debby CROME, MD  Active   nadolol  (CORGARD ) 20 MG tablet [id number] Yes Take 1 tablet (20 mg total) by mouth daily. Joshua Debby CROME, MD  Active   rosuvastatin  (CRESTOR ) 20 MG tablet [id number] Yes Take 1 tablet (20 mg total) by mouth daily. Joshua Debby CROME, MD  Active   sertraline  (ZOLOFT ) 100 MG tablet [id number] Yes Take 1 tablet (100 mg total) by mouth daily. Joshua Debby CROME, MD  Active   spironolactone  (ALDACTONE ) 25 MG tablet [id number]  Take 1 tablet (25 mg total) by mouth daily.  Patient not taking: Reported on 03/31/2024   Joshua Debby CROME, MD  Active   tirzepatide  (MOUNJARO ) 2.5 MG/0.5ML Pen [id number] Yes Inject 2.5 mg into the skin once a week. [provider]  Active   Vonoprazan Fumarate  (VOQUEZNA ) 10 MG TABS [id number]  Take 1 tablet by mouth daily.  Patient not taking: Reported on 03/31/2024   Joshua Debby CROME, MD  Active               Assessment/Plan:   {Pharmacy A/P Choices:26421}  Follow Up Plan: ***  ***

## 2024-03-31 NOTE — Progress Notes (Signed)
 Diagnosis: Acute Anemia  Provider:  Lonna Coder MD  Procedure: IV Infusion  IV Type: Peripheral, IV Location: L Antecubital  Venofer  (Iron  Sucrose), Dose: 200 mg  Infusion Start Time: 1131  Infusion Stop Time: 1147  Post Infusion IV Care: Patient declined observation and Peripheral IV Discontinued  Discharge: Condition: Good, Destination: Home . AVS Declined  Performed by:  Donny Childes, RN

## 2024-03-31 NOTE — Patient Instructions (Signed)
 Hilton GORMAN Finder - I am sorry I was unable to reach you today for our scheduled appointment. I work with Joshua Debby CROME, MD and am calling to support your healthcare needs. Please contact me at [phone] at your earliest convenience. I look forward to speaking with you soon.   Thank you,    .Jolissa Kapral, LCSW [place]  [facility], [facility] Licensed Clinical Social Worker  Direct Dial: [phone]

## 2024-04-01 ENCOUNTER — Telehealth: Payer: Self-pay

## 2024-04-01 ENCOUNTER — Encounter: Payer: Self-pay | Admitting: Hematology and Oncology

## 2024-04-01 ENCOUNTER — Other Ambulatory Visit (HOSPITAL_COMMUNITY): Payer: Self-pay

## 2024-04-01 NOTE — Telephone Encounter (Signed)
 Pharmacy Patient Advocate Encounter   Received notification from Onbase that prior authorization for Mounjaro  5MG /0.5ML auto-injectors  is required/requested.   Insurance verification completed.   The patient is insured through [facility].   Per test claim: Refill too soon. PA is not needed at this time. Medication was filled 04/01/24. Next eligible fill date is 04/22/24.

## 2024-04-02 ENCOUNTER — Other Ambulatory Visit (HOSPITAL_COMMUNITY): Payer: Self-pay

## 2024-04-02 ENCOUNTER — Telehealth: Payer: Self-pay | Admitting: Pharmacy Technician

## 2024-04-02 ENCOUNTER — Encounter: Payer: Self-pay | Admitting: Hematology and Oncology

## 2024-04-02 MED ORDER — TIRZEPATIDE 5 MG/0.5ML ~~LOC~~ SOAJ
5.0000 mg | SUBCUTANEOUS | 0 refills | Status: DC
  Filled 2024-04-02: qty 2, 28d supply, fill #0

## 2024-04-02 MED FILL — Albuterol-Budesonide Inhalation Aerosol 90-80 MCG/ACT: 2.0000 | RESPIRATORY_TRACT | 10 days supply | Qty: 10.7 | Fill #0 | Status: AC

## 2024-04-02 NOTE — Progress Notes (Signed)
° °  04/02/2024 Name: Jenna Stephens MRN: [id number] DOB: Jun 18, 1975  Patient is appearing for a follow-up visit with the population health pharmacy technician. Last engaged with the clinical pharmacist to discuss diabetes, hypertension, medication access, and asthma on 03/31/2024. Contacted patient today to discuss medication access.   Plan from last clinical pharmacist appointment:  Diabetes: - Currently uncontrolled; goal A1c <7%. Cardiorenal risk reduction is opportunities for improvement.. Blood pressure is not at goal <130/80. LDL is not at goal.  - Okay to remain off metformin  since she is taking Mounjaro  - Increase Mounjaro  to 5 mg weekly - Will see if there is a coupon that may lower the cost of Mounjaro . She also has Medicaid on file and she notes she believes it is still active. Will see if pharmacy can run Medicaid as well Hypertension: - Currently uncontrolled but improved. BP goal <130/80 - Reviewed long term cardiovascular and renal outcomes of uncontrolled blood pressure - Recommend to continue current regimen. - Expect more BP lowering if weight loss continues with Mounjaro  Asthma: - Currently controlled.  - Will see if there is a low dose ICS and formoterol  she can use PRN for asthma symptoms. Cost will be the main issue with all inhalers. - Sent Symbicort  however this was not covered. Sent AirSupra PRN, f/u with pharmacy GERD: Sending pantoprazole  since Voquezna  is unaffordable. Pt notes pantoprazole  did help in the past Follow Up Plan: (copy/paste from last note)   Medication Adherence Barriers Identified:  Access issues with any new medication or testing device: Yes Mounjaro  and Airsupra Called patient and was able to complete enrollment for the 2 copay coupon cards as below.  Mounjaro  Savings Card RXBIN: [id number] PCN: [id number] GRP: [id number] ID: [id number] Expiration Date: 04/22/2024  Airsupra Savings Card [id number] PCN: [id number] GRP: [id number] ID:  [id number]  Medication Adherence Barriers Addressed/Actions Taken:  Reviewed medication changes per plan from last clinical pharmacist note Medication Access for Mounjaro  and Airsupra Will discuss medication access concerns with pharmacist Contacted pharmacy regarding new prescriptions Per pharmacist, patient has commercial insurance with [employer]. She has a [facility] Medicaid card as well but that insurance is not working due to [employer] name. Apparently patient had a name change and it has not been updated with [facility] Medicaid. Pharmacist informs if patient uses commercial plan then he would be able to put a coupon card with that to get the cost of the Mounjaro  down from $140 and Airsupra down from $47. Called back and spoke to a different pharmacist who was able to use both coupon copay cards. He informs the cost for Mounjaro  went to $104.33 and the cost for AirSupra went to $0. Asked him if the Mounjaro  was with the insurance and the coupon card and he informs yes. Called CPh[facility] to run test claim. Per test clam, Camila was $25 with insurance and evoucher. Called patient back and informed of the prices. Patient inquired if prescriptions could be sent to [facility] at [facility] [address] due to test claim showing Mounjaro  with coupon card is $25  Next clinical pharmacist appointment is scheduled for: TBD   Jenna Stephens, CPhT [facility] Population Health Pharmacy Office: [phone] Email: [email or web address]

## 2024-04-02 NOTE — Patient Instructions (Signed)
 It was a pleasure speaking with you today!  Increase Mounjaro  to 5 mg weekly. Start AirSupra inhaler to take as needed for asthma symptoms/shortness of breath. This replaces your albuterol  inhaler.  Feel free to call with any questions or concerns!  Darrelyn Drum, PharmD, BCPS, CPP Clinical Pharmacist Practitioner [facility] [phone]

## 2024-04-03 ENCOUNTER — Telehealth: Payer: Self-pay | Admitting: Pharmacy Technician

## 2024-04-03 ENCOUNTER — Encounter: Payer: Self-pay | Admitting: Hematology and Oncology

## 2024-04-03 ENCOUNTER — Other Ambulatory Visit (HOSPITAL_COMMUNITY): Payer: Self-pay

## 2024-04-03 ENCOUNTER — Telehealth (HOSPITAL_COMMUNITY): Payer: Self-pay | Admitting: *Deleted

## 2024-04-03 NOTE — Telephone Encounter (Signed)
 Reminder call with instructions given for upcoming GXT on 04/10/24 at 9:30

## 2024-04-03 NOTE — Progress Notes (Signed)
° °  04/03/2024 Name: Jenna Stephens MRN: [id number] DOB: [birth year]  Patient is appearing for a follow-up visit with the population health pharmacy technician. Last engaged with the clinical pharmacist to discuss diabetes, hypertension, medication access, and asthma on 03/31/2024. Contacted patient today to discuss medication access.   Plan from last clinical pharmacist appointment:  Diabetes: - Currently uncontrolled; goal A1c <7%. Cardiorenal risk reduction is opportunities for improvement.. Blood pressure is not at goal <130/80. LDL is not at goal.  - Okay to remain off metformin  since she is taking Mounjaro  - Increase Mounjaro  to 5 mg weekly - Will see if there is a coupon that may lower the cost of Mounjaro . She also has Medicaid on file and she notes she believes it is still active. Will see if pharmacy can run Medicaid as well Hypertension: - Currently uncontrolled but improved. BP goal <130/80 - Reviewed long term cardiovascular and renal outcomes of uncontrolled blood pressure - Recommend to continue current regimen. - Expect more BP lowering if weight loss continues with Mounjaro  Asthma: - Currently controlled.  - Will see if there is a low dose ICS and formoterol  she can use PRN for asthma symptoms. Cost will be the main issue with all inhalers. - Sent Symbicort  however this was not covered. Sent AirSupra PRN, f/u with pharmacy GERD: Sending pantoprazole  since Voquezna  is unaffordable. Pt notes pantoprazole  did help in the past Follow Up Plan:(copy/paste from last note)   Medication Adherence Barriers Identified:  Access issues with any new medication or testing device: Yes Mounjaro  and Airsupra   Medication Adherence Barriers Addressed/Actions Taken:  Reviewed medication changes per plan from last clinical pharmacist note Medication Access for Mounjaro  and Airsupra Will discuss medication access concerns with pharmacist Contacted pharmacy regarding new  prescriptions Educated patient to contact pharmacy regarding new prescriptions Spoke to Thom who was able to bill each prescription to the respective coupon savings card. Mounjaro  with patient's insurance and savings card is $25 and Airsupra with patient's insurance and savings card is $0. Medications are ready for patient to pick up today. Called patient and informed that the medications were ready to be picked up for a total cost of $25. Informed patient to call pharmacy about 1 week prior to running out of medication and to remind pharmacy staff to use insurance card plus coupon savings card. Patient verbalized understanding.  Next clinical pharmacist appointment is scheduled for: TBD  [name], CPhT [facility] Population Health Pharmacy Office: [phone] Email: [email or web address]

## 2024-04-07 ENCOUNTER — Ambulatory Visit

## 2024-04-07 VITALS — BP 125/85 | HR 71 | Temp 98.1°F | Resp 16 | Ht 68.0 in | Wt 267.8 lb

## 2024-04-07 DIAGNOSIS — D500 Iron deficiency anemia secondary to blood loss (chronic): Secondary | ICD-10-CM | POA: Diagnosis not present

## 2024-04-07 MED ORDER — IRON SUCROSE 200 MG IVPB - SIMPLE MED
200.0000 mg | Freq: Once | Status: AC
  Administered 2024-04-07: 09:00:00 200 mg via INTRAVENOUS
  Filled 2024-04-07: qty 110

## 2024-04-07 NOTE — Progress Notes (Signed)
 Diagnosis:  Iron  Deficiency Anemia  Provider:  Praveen Mannam MD  Procedure: IV Infusion  IV Type: Peripheral, IV Location: R Antecubital   Venofer  (Iron  Sucrose), Dose: 200 mg  Infusion Start Time: 0923  Infusion Stop Time: 0941  Post Infusion IV Care: Patient declined observation and Peripheral IV Discontinued  Discharge: Condition: Good, Destination: Home . AVS Declined  Performed by:  Maximiano JONELLE Pouch, LPN

## 2024-04-10 ENCOUNTER — Ambulatory Visit (HOSPITAL_COMMUNITY)
Admission: RE | Admit: 2024-04-10 | Discharge: 2024-04-10 | Disposition: A | Discharge status: Home / Self Care | Source: Ambulatory Visit | Attending: Cardiology | Admitting: Cardiology

## 2024-04-10 DIAGNOSIS — R9431 Abnormal electrocardiogram [ECG] [EKG]: Secondary | ICD-10-CM | POA: Diagnosis not present

## 2024-04-10 DIAGNOSIS — R0789 Other chest pain: Secondary | ICD-10-CM | POA: Insufficient documentation

## 2024-04-10 LAB — EXERCISE TOLERANCE TEST
Angina Index: 1
Duke Treadmill Score: 2
Estimated workload: 7
Exercise duration (min): 6 min
Exercise duration (sec): 0 s
MPHR: 174 {beats}/min
Peak HR: 150 {beats}/min
Percent HR: 86 %
RPE: 18
Rest HR: 80 {beats}/min
ST Depression (mm): 0 mm

## 2024-04-14 ENCOUNTER — Ambulatory Visit (INDEPENDENT_AMBULATORY_CARE_PROVIDER_SITE_OTHER)

## 2024-04-14 ENCOUNTER — Other Ambulatory Visit: Payer: Self-pay | Admitting: Internal Medicine

## 2024-04-14 VITALS — BP 130/88 | HR 69 | Temp 98.5°F | Resp 20 | Ht 68.0 in | Wt 270.0 lb

## 2024-04-14 DIAGNOSIS — D500 Iron deficiency anemia secondary to blood loss (chronic): Secondary | ICD-10-CM | POA: Diagnosis not present

## 2024-04-14 DIAGNOSIS — R9431 Abnormal electrocardiogram [ECG] [EKG]: Secondary | ICD-10-CM

## 2024-04-14 DIAGNOSIS — E876 Hypokalemia: Secondary | ICD-10-CM

## 2024-04-14 DIAGNOSIS — I10 Essential (primary) hypertension: Secondary | ICD-10-CM

## 2024-04-14 MED ORDER — IRON SUCROSE 200 MG IVPB - SIMPLE MED
200.0000 mg | Freq: Once | Status: AC
  Administered 2024-04-14: 200 mg via INTRAVENOUS
  Filled 2024-04-14: qty 110

## 2024-04-14 NOTE — Progress Notes (Signed)
 Diagnosis:  Iron  Deficiency Anemia  Provider:  Praveen Mannam MD  Procedure: IV Infusion  IV Type: Peripheral, IV Location: R Antecubital   Venofer  (Iron  Sucrose), Dose: 200 mg  Infusion Start Time: 0918  Infusion Stop Time: 0934  Post Infusion IV Care: Patient declined observation and Peripheral IV Discontinued  Discharge: Condition: Good, Destination: Home . AVS Declined  Performed by:  Maximiano JONELLE Pouch, LPN

## 2024-04-20 ENCOUNTER — Telehealth: Payer: Self-pay | Admitting: Pharmacist

## 2024-04-20 ENCOUNTER — Other Ambulatory Visit: Payer: Self-pay | Admitting: Internal Medicine

## 2024-04-20 DIAGNOSIS — E785 Hyperlipidemia, unspecified: Secondary | ICD-10-CM

## 2024-04-20 NOTE — Progress Notes (Signed)
 Pharmacy Quality Measure Review  This patient is appearing on a report for being at risk of failing the Glycemic Status Assessment in Diabetes and Controlling Blood Pressure measure this calendar year.   Last documented BP 130/88 on 04/14/24  Last documented A1c or GMI 7.2 on 01/21/24 MACR was checked 01/21/24  BP improved since amlodipine  was added.  Mounjaro  was increased to 5 mg 2 weeks ago. She has PCP f/u tomorrow. Will need Mounjaro  refill (or increased dose if tolerating) to [facility].  Darrelyn Drum, PharmD, BCPS, CPP Clinical Pharmacist Practitioner [facility] at [facility] [phone]

## 2024-04-21 ENCOUNTER — Ambulatory Visit: Admitting: Internal Medicine

## 2024-04-21 NOTE — Progress Notes (Unsigned)
 "  Subjective:  Patient ID: Hilton GORMAN Finder, female    DOB: 1975/10/25  Age: 48 y.o. MRN: [id number]  CC: No chief complaint on file.   HPI RUBYANN LINGLE presents for not seen  Discussed the use of AI scribe software for clinical note transcription with the patient, who gave verbal consent to proceed.  History of Present Illness      Outpatient Medications Prior to Visit  Medication Sig Dispense Refill   Albuterol -Budesonide  (AIRSUPRA ) 90-80 MCG/ACT AERO Inhale 2 puffs into the lungs every hour as needed (Maximum of 12 inhalations per day). 10.7 g 5   ALPRAZolam  (XANAX ) 1 MG tablet TAKE 1 TABLET BY MOUTH 2 TIMES DAILY AS NEEDED FOR ANXIETY. 60 tablet 0   amLODipine  (NORVASC ) 5 MG tablet Take 1 tablet (5 mg total) by mouth daily. 90 tablet 0   [facility] ASPIRIN  LOW DOSE 81 MG tablet TAKE 1 TABLET (81 MG TOTAL) BY MOUTH DAILY. SWALLOW WHOLE. (Patient taking differently: Take 81 mg by mouth daily in the afternoon. SWALLOW WHOLE.) 90 tablet 1   Ferric Maltol  (ACCRUFER ) 30 MG CAPS Take 1 capsule (30 mg total) by mouth in the morning and at bedtime. (Patient not taking: Reported on 03/11/2024) 180 capsule 0   ipratropium-albuterol  (DUONEB) 0.5-2.5 (3) MG/3ML SOLN Take 3 mLs by nebulization every 4 (four) hours as needed. (Patient taking differently: Take 3 mLs by nebulization as needed (wheezing/SOB).) 360 mL 1   nadolol  (CORGARD ) 20 MG tablet TAKE 1 TABLET BY MOUTH EVERY DAY 90 tablet 0   pantoprazole  (PROTONIX ) 40 MG tablet Take 1 tablet (40 mg total) by mouth daily. 30 tablet 3   rosuvastatin  (CRESTOR ) 20 MG tablet Take 1 tablet (20 mg total) by mouth daily. 90 tablet 0   sertraline  (ZOLOFT ) 100 MG tablet Take 1 tablet (100 mg total) by mouth daily. 90 tablet 0   tirzepatide  (MOUNJARO ) 5 MG/0.5ML Pen Inject 5 mg into the skin once a week. 2 mL 0   No facility-administered medications prior to visit.    ROS Review of Systems  Objective:  LMP 03/22/2024 (Approximate)   BP Readings from  Last 3 Encounters:  04/14/24 130/88  04/07/24 125/85  03/31/24 129/83    Wt Readings from Last 3 Encounters:  04/14/24 270 lb (122.5 kg)  04/07/24 267 lb 12.8 oz (121.5 kg)  03/31/24 271 lb 9.6 oz (123.2 kg)    Physical Exam  Lab Results  Component Value Date   WBC 10.4 02/25/2024   HGB 10.0 (L) 02/25/2024   HCT 36.3 02/25/2024   PLT 341 02/25/2024   GLUCOSE 139 (H) 02/25/2024   CHOL 266 (H) 01/21/2024   TRIG 150.0 (H) 01/21/2024   HDL 50.50 01/21/2024   LDLCALC 185 (H) 01/21/2024   ALT 9 02/25/2024   AST 15 02/25/2024   NA 140 02/25/2024   K 3.4 (L) 02/25/2024   CL 103 02/25/2024   CREATININE 0.63 02/25/2024   BUN 9 02/25/2024   CO2 25 02/25/2024   TSH 0.50 01/21/2024   INR 0.99 11/28/2017   HGBA1C 7.2 (H) 01/21/2024   MICROALBUR 1.2 01/21/2024    EXERCISE TOLERANCE TEST (ETT) Result Date: 04/10/2024   No ST deviation was noted. Arrhythmias during stress: rare PVCs. ECG was interpretable and conclusive. The ECG was negative for ischemia. Negative adequate stress test without evidence of ischemia at given workload. Hypertensive response to exercise. Occasional PVC's noted.   Assessment & Plan:  Hypokalemia -     Basic metabolic panel  with GFR; Future -     Magnesium ; Future  Type 2 diabetes mellitus without complication, without long-term current use of insulin  (HCC) -     Basic metabolic panel with GFR; Future -     Hemoglobin A1c; Future  Malignant hypertension -     Basic metabolic panel with GFR; Future -     Magnesium ; Future     Follow-up: No follow-ups on file.  Debby Molt, MD "

## 2024-04-22 ENCOUNTER — Encounter: Payer: Self-pay | Admitting: *Deleted

## 2024-04-22 ENCOUNTER — Telehealth: Payer: Self-pay | Admitting: *Deleted

## 2024-04-22 NOTE — Patient Instructions (Signed)
 Hilton GORMAN Finder - I am sorry I was unable to reach you today. I work with Joshua Debby CROME, MD and am calling to support your healthcare needs. Please contact me at [phone] at your earliest convenience. I look forward to speaking with you soon.   Thank you,     Demika Langenderfer, LCSW [place]  [facility], [facility] Licensed Clinical Social Worker  Direct Dial: [phone]

## 2024-04-28 ENCOUNTER — Other Ambulatory Visit (HOSPITAL_COMMUNITY): Payer: Self-pay

## 2024-04-28 DIAGNOSIS — E119 Type 2 diabetes mellitus without complications: Secondary | ICD-10-CM

## 2024-04-28 MED ORDER — TIRZEPATIDE 7.5 MG/0.5ML ~~LOC~~ SOAJ
7.5000 mg | SUBCUTANEOUS | 0 refills | Status: DC
  Filled 2024-04-28: qty 2, 28d supply, fill #0

## 2024-04-28 NOTE — Telephone Encounter (Signed)
 Pt reports tolerating mounjaro  5 mg without issue. Sending 7.5 mg dose to pharmacy for refill.  Darrelyn Drum, PharmD, BCPS, CPP Clinical Pharmacist Practitioner [facility] at [facility] [phone]

## 2024-04-30 ENCOUNTER — Ambulatory Visit
Admission: RE | Admit: 2024-04-30 | Discharge: 2024-04-30 | Disposition: A | Discharge status: Home / Self Care | Source: Ambulatory Visit | Attending: Internal Medicine | Admitting: Internal Medicine

## 2024-04-30 DIAGNOSIS — Z1231 Encounter for screening mammogram for malignant neoplasm of breast: Secondary | ICD-10-CM

## 2024-05-01 ENCOUNTER — Encounter: Payer: Self-pay | Admitting: Cardiology

## 2024-05-01 ENCOUNTER — Ambulatory Visit: Attending: Cardiology | Admitting: Cardiology

## 2024-05-01 ENCOUNTER — Other Ambulatory Visit (HOSPITAL_COMMUNITY): Payer: Self-pay

## 2024-05-01 VITALS — BP 124/76 | HR 74 | Ht 68.0 in | Wt 260.0 lb

## 2024-05-01 DIAGNOSIS — R9431 Abnormal electrocardiogram [ECG] [EKG]: Secondary | ICD-10-CM | POA: Diagnosis not present

## 2024-05-01 DIAGNOSIS — R0789 Other chest pain: Secondary | ICD-10-CM

## 2024-05-01 DIAGNOSIS — E119 Type 2 diabetes mellitus without complications: Secondary | ICD-10-CM

## 2024-05-01 DIAGNOSIS — E876 Hypokalemia: Secondary | ICD-10-CM | POA: Diagnosis not present

## 2024-05-01 DIAGNOSIS — E7800 Pure hypercholesterolemia, unspecified: Secondary | ICD-10-CM

## 2024-05-01 DIAGNOSIS — I10 Essential (primary) hypertension: Secondary | ICD-10-CM | POA: Diagnosis not present

## 2024-05-01 NOTE — Addendum Note (Signed)
 Addended by: CECELIA MADURA R on: 05/01/2024 09:52 AM   Modules accepted: Orders

## 2024-05-01 NOTE — Patient Instructions (Signed)
 Medication Instructions:  Your physician recommends that you continue on your current medications as directed. Please refer to the Current Medication list given to you today.  *If you need a refill on your cardiac medications before your next appointment, please call your pharmacy*  Lab Work: None ordered If you have labs (blood work) drawn today and your tests are completely normal, you will receive your results only by: MyChart Message (if you have MyChart) OR A paper copy in the mail If you have any lab test that is abnormal or we need to change your treatment, we will call you to review the results.  Testing/Procedures: None ordered  Follow-Up: At [facility], you and your health needs are our priority.  As part of our continuing mission to provide you with exceptional heart care, our providers are all part of one team.  This team includes your primary Cardiologist (physician) and Advanced Practice Providers or APPs (Physician Assistants and Nurse Practitioners) who all work together to provide you with the care you need, when you need it.  Your next appointment:   As Needed  Provider:   Madonna Large, DO    We recommend signing up for the patient portal called MyChart.  Sign up information is provided on this After Visit Summary.  MyChart is used to connect with patients for Virtual Visits (Telemedicine).  Patients are able to view lab/test results, encounter notes, upcoming appointments, etc.  Non-urgent messages can be sent to your provider as well.   To learn more about what you can do with MyChart, go to [email or web address].

## 2024-05-01 NOTE — Progress Notes (Signed)
 "   Cardiology Office Note:    NAME:  Jenna Stephens    MRN: [id number] DOB:  [birth year]   PCP:  Joshua Debby CROME, MD  Former Cardiology Providers: None  Primary Cardiologist:  Madonna Large, DO, [facility] (established care 01/27/2024) Electrophysiologist:  None   Chief Complaint  Patient presents with   Follow-up    Review symptoms and discuss test results    History of Present Illness:    Jenna Stephens is a 49 y.o. African-American female whose past medical history and cardiovascular risk factors includes: Hypertension, hyperlipidemia, hypokalemia, asthma, anemia, smoking, marijuana.   During initial consult patient was endorsing chest tightness which had both cardiac and noncardiac features.  She was recommended to undergo echo and GXT for further evaluation.  In addition, the reason of consult was for prolonged QT she had no history of congenital heart disease, she was on medications such as Zoloft , Xanax , and Dyazide  which may have been contributory as well as electrolyte normalities notable for hypokalemia.  Patient was placed on potassium supplementation and was advised to discuss with PCP regarding avoiding QT prolonging medications.  She presents today for follow-up.  Patient is accompanied by Jenna Stephens.  Denies chest pain/tightness. No heart failure symptoms. Patient states that she has lost approximately 20 pounds since initiation of Mounjaro . She has also discontinued triamterene /hydrochlorothiazide  and no longer on potassium supplementation. She is on amlodipine  5 mg p.o. daily and states that home blood pressures are well-controlled. Last labs from November 2025 notes hypokalemia, she was supposed to have follow-up labs in December when she saw PCP but they are still pending.  Two weeks ago was walking on treadmill - 30 minutes and no issues.   No first degree relatives with premature coronary disease or sudden cardiac death.  Current Medications: Current Meds  Medication  Sig   Albuterol -Budesonide  (AIRSUPRA ) 90-80 MCG/ACT AERO Inhale 2 puffs into the lungs every hour as needed (Maximum of 12 inhalations per day).   ALPRAZolam  (XANAX ) 1 MG tablet TAKE 1 TABLET BY MOUTH 2 TIMES DAILY AS NEEDED FOR ANXIETY.   amLODipine  (NORVASC ) 5 MG tablet Take 1 tablet (5 mg total) by mouth daily.   [facility] ASPIRIN  LOW DOSE 81 MG tablet TAKE 1 TABLET (81 MG TOTAL) BY MOUTH DAILY. SWALLOW WHOLE. (Patient taking differently: Take 81 mg by mouth daily in the afternoon. SWALLOW WHOLE.)   Ferric Maltol  (ACCRUFER ) 30 MG CAPS Take 1 capsule (30 mg total) by mouth in the morning and at bedtime.   ipratropium-albuterol  (DUONEB) 0.5-2.5 (3) MG/3ML SOLN Take 3 mLs by nebulization every 4 (four) hours as needed. (Patient taking differently: Take 3 mLs by nebulization as needed (wheezing/SOB).)   nadolol  (CORGARD ) 20 MG tablet TAKE 1 TABLET BY MOUTH EVERY DAY   pantoprazole  (PROTONIX ) 40 MG tablet Take 1 tablet (40 mg total) by mouth daily.   rosuvastatin  (CRESTOR ) 20 MG tablet TAKE 1 TABLET BY MOUTH EVERY DAY   sertraline  (ZOLOFT ) 100 MG tablet Take 1 tablet (100 mg total) by mouth daily.   tirzepatide  (MOUNJARO ) 7.5 MG/0.5ML Pen Inject 7.5 mg into the skin once a week.     Allergies:    Patient has no known allergies.   Past Medical History: Past Medical History:  Diagnosis Date   Acute respiratory failure (HCC) 12/29/2017   Anxiety    Asthma    Gestational diabetes    Hypertension    Iron  deficiency anemia    Obesity     Past Surgical  History: Past Surgical History:  Procedure Laterality Date   TONSILLECTOMY     TUBAL LIGATION N/A 10/07/2018   Procedure: POST PARTUM TUBAL LIGATION;  Surgeon: Barbra Lang PARAS, DO;  Location: [facility] LD ORS;  Service: Gynecology;  Laterality: N/A;    Social History: Social History   Tobacco Use   Smoking status: Some Days    Current packs/day: 0.00    Average packs/day: 0.3 packs/day    Types: Cigarettes    Last attempt to quit: 01/06/2018     Years since quitting: 6.3    Passive exposure: Past   Smokeless tobacco: Never  Vaping Use   Vaping status: Never Used  Substance Use Topics   Alcohol use: Yes    Alcohol/week: 2.0 standard drinks of alcohol    Types: 2 Standard drinks or equivalent per week   Drug use: Not Currently    Types: Marijuana    Family History: Family History  Problem Relation Age of Onset   Diabetes Stephens    Hypertension Stephens    Hypertension Father    COPD Father     ROS:   Review of Systems  Cardiovascular:  Negative for chest pain, claudication, irregular heartbeat, leg swelling, near-syncope, orthopnea, palpitations, paroxysmal nocturnal dyspnea and syncope.  Respiratory:  Negative for shortness of breath.   Hematologic/Lymphatic: Negative for bleeding problem.    EKGs/Labs/Other Studies Reviewed:   EKG: 11/06/2022: Sinus rhythm, 93 bpm, QTc 471 ms, without underlying ischemia or injury pattern  January 21, 2024: Sinus rhythm, 88 bpm, QTc 476 ms, PR 148 ms, without underlying ischemia or injury pattern  Echocardiogram: 2019: LVEF 55 to 60%, basal septal hypertrophy, grade 1 diastolic dysfunction  03/13/2024 1. Left ventricular ejection fraction, by estimation, is 60 to 65%. Left ventricular ejection fraction by 3D volume is 60 %. The left ventricle has normal function. The left ventricle has no regional wall motion abnormalities. There is mild concentric  left ventricular hypertrophy. Left ventricular diastolic parameters were normal. The average left ventricular global longitudinal strain is -19.3 %. The global longitudinal strain is normal. 2. Right ventricular systolic function is normal. The right ventricular size is normal. 3. Left atrial size was mildly dilated. 4. The mitral valve is normal in structure. Mild mitral valve regurgitation. No evidence of mitral stenosis. 5. The aortic valve is normal in structure. Aortic valve regurgitation is not visualized. No aortic stenosis is  present. 6. The inferior vena cava is normal in size with greater than 50% respiratory variability, suggesting right atrial pressure of 3 mmHg.   GXT 04/10/2024 Negative adequate stress test without evidence of ischemia at given workload. Hypertensive response to exercise. Occasional PVC's noted.   Cardiac monitor (Zio Patch): 01/29/2024 - 02/12/2024 Dominant rhythm sinus.  Tachycardia burden, 12%.  Heart rate 54-147 bpm. Avg HR 83 bpm. No atrial fibrillation detected during the monitoring period. No supraventricular tachycardia, ventricular tachycardia, high grade AV block, pauses (3 seconds or longer). Total supraventricular ectopic burden 0%. Total ventricular ectopic burden <1%. Patient triggered events: 31.  Underlying rhythm sinus with rare PVCs. Accuracy limited due to baseline artifact on multiple tracings.   Labs:    Latest Ref Rng & Units 02/25/2024   12:06 PM 02/25/2024    9:37 AM 01/21/2024    3:09 PM  CBC  WBC 4.0 - 10.5 K/uL 10.4  10.0  13.0   Hemoglobin 12.0 - 15.0 g/dL 89.9  89.7  9.7   Hematocrit 36.0 - 46.0 % 36.3  35.3  33.0  Platelets 150 - 400 K/uL 341  344  315.0        Latest Ref Rng & Units 02/25/2024   12:06 PM 02/25/2024    9:37 AM 01/27/2024    4:28 PM  BMP  Glucose 70 - 99 mg/dL 860  844  881   BUN 6 - 20 mg/dL 9  10  14    Creatinine 0.44 - 1.00 mg/dL 9.36  9.25  9.29   BUN/Creat Ratio 9 - 23   20   Sodium 135 - 145 mmol/L 140  141  139   Potassium 3.5 - 5.1 mmol/L 3.4  3.5  3.7   Chloride 98 - 111 mmol/L 103  105  101   CO2 22 - 32 mmol/L 25  30  24    Calcium  8.9 - 10.3 mg/dL 9.1  9.0  9.1       Latest Ref Rng & Units 02/25/2024   12:06 PM 02/25/2024    9:37 AM 01/27/2024    4:28 PM  CMP  Glucose 70 - 99 mg/dL 860  844  881   BUN 6 - 20 mg/dL 9  10  14    Creatinine 0.44 - 1.00 mg/dL 9.36  9.25  9.29   Sodium 135 - 145 mmol/L 140  141  139   Potassium 3.5 - 5.1 mmol/L 3.4  3.5  3.7   Chloride 98 - 111 mmol/L 103  105  101   CO2 22 - 32  mmol/L 25  30  24    Calcium  8.9 - 10.3 mg/dL 9.1  9.0  9.1   Total Protein 6.5 - 8.1 g/dL 7.8  7.3    Total Bilirubin 0.0 - 1.2 mg/dL 0.4  0.4    Alkaline Phos 38 - 126 U/L 86  77    AST 15 - 41 U/L 15  11    ALT 0 - 44 U/L 9  9      Lab Results  Component Value Date   CHOL 266 (H) 01/21/2024   HDL 50.50 01/21/2024   LDLCALC 185 (H) 01/21/2024   TRIG 150.0 (H) 01/21/2024   CHOLHDL 5 01/21/2024   No results for input(s): LIPOA in the last 8760 hours. No components found for: NTPROBNP No results for input(s): PROBNP in the last 8760 hours. Recent Labs    01/21/24 1509  TSH 0.50    Physical Exam:    Today's Vitals   05/01/24 0954  BP: 124/76  Pulse: 74  SpO2: 96%  Weight: 260 lb (117.9 kg)  Height: 5' 8 (1.727 m)    Body mass index is 39.53 kg/m. Wt Readings from Last 3 Encounters:  05/01/24 260 lb (117.9 kg)  04/14/24 270 lb (122.5 kg)  04/07/24 267 lb 12.8 oz (121.5 kg)    Physical Exam  Constitutional: No distress.  hemodynamically stable  Neck: No JVD present.  Cardiovascular: Normal rate, regular rhythm, S1 normal and S2 normal. Exam reveals no gallop, no S3 and no S4.  No murmur heard. Pulmonary/Chest: Effort normal and breath sounds normal. No stridor. She has no wheezes. She has no rales.  Musculoskeletal:        General: No edema.     Cervical back: Neck supple.  Skin: Skin is warm.     Impression & Recommendation(s):  Impression:   ICD-10-CM   1. Prolonged QT interval  R94.31     2. Chest tightness  R07.89     3. Benign hypertension  I10  4. Pure hypercholesterolemia  E78.00     5. Hypokalemia  E87.6 Basic metabolic panel with GFR    Magnesium     6. Type 2 diabetes mellitus without complication, without long-term current use of insulin  (HCC)  E11.9 Basic metabolic panel with GFR    Hemoglobin A1c    7. Malignant hypertension  I10 Basic metabolic panel with GFR    Magnesium       Recommendation(s):  Prolonged QT  interval Prior EKGs reviewed. Electrolyte abnormalities: History of hypokalemia while on triamterene /hydrochlorothiazide , was on potassium supplementation.  Both diuretics and potassium supplementations have been discontinued according to the patient.  Recheck BMP.  Family history: None Medication review: Zoloft  (new - sept 2025), Xanax  (12/2023) Congenital heart disease history:None History of syncope:None Already on nadolol  20 mg p.o. daily. Started by PCP on 01/21/2024 Echocardiogram: Preserved LVEF no structural heart disease GXT: Negative for ischemia no malignant arrhythmia Cardiac monitor: Sinus rhythm no sustained arrhythmias Recommend avoiding medications that may prolong Jenna QT.  Hypokalemia Patient stopped potassium supplementation since discontinuing Dyazide  Recheck BMP which is pending  Chest tightness No reoccurrence since the last office visit. Reviewed the results of the echo, GXT with the patient and Jenna Stephens at today's office visit. Reemphasize importance of improving Jenna modifiable cardiovascular risk factors such as lipids, weight, blood pressure.  Reemphasized importance of moderate intensity exercise for 30 minutes a day 150 minutes/week as tolerated.  No additional testing warranted at this time in an asymptomatic female  Benign hypertension Office blood pressures are better controlled. Continue amlodipine  5 mg p.o. daily. Continue nadolol  20 mg p.o. daily  Reemphasized importance of low-salt diet.    Pure hypercholesterolemia Continue Crestor  20 mg p.o. nightly  Orders Placed:  No orders of the defined types were placed in this encounter.    Final Medication List:   No orders of the defined types were placed in this encounter.   There are no discontinued medications.   Current Outpatient Medications:    Albuterol -Budesonide  (AIRSUPRA ) 90-80 MCG/ACT AERO, Inhale 2 puffs into the lungs every hour as needed (Maximum of 12 inhalations per day)., Disp:  10.7 g, Rfl: 5   ALPRAZolam  (XANAX ) 1 MG tablet, TAKE 1 TABLET BY MOUTH 2 TIMES DAILY AS NEEDED FOR ANXIETY., Disp: 60 tablet, Rfl: 0   amLODipine  (NORVASC ) 5 MG tablet, Take 1 tablet (5 mg total) by mouth daily., Disp: 90 tablet, Rfl: 0   CVS ASPIRIN  LOW DOSE 81 MG tablet, TAKE 1 TABLET (81 MG TOTAL) BY MOUTH DAILY. SWALLOW WHOLE. (Patient taking differently: Take 81 mg by mouth daily in the afternoon. SWALLOW WHOLE.), Disp: 90 tablet, Rfl: 1   Ferric Maltol  (ACCRUFER ) 30 MG CAPS, Take 1 capsule (30 mg total) by mouth in the morning and at bedtime., Disp: 180 capsule, Rfl: 0   ipratropium-albuterol  (DUONEB) 0.5-2.5 (3) MG/3ML SOLN, Take 3 mLs by nebulization every 4 (four) hours as needed. (Patient taking differently: Take 3 mLs by nebulization as needed (wheezing/SOB).), Disp: 360 mL, Rfl: 1   nadolol  (CORGARD ) 20 MG tablet, TAKE 1 TABLET BY MOUTH EVERY DAY, Disp: 90 tablet, Rfl: 0   pantoprazole  (PROTONIX ) 40 MG tablet, Take 1 tablet (40 mg total) by mouth daily., Disp: 30 tablet, Rfl: 3   rosuvastatin  (CRESTOR ) 20 MG tablet, TAKE 1 TABLET BY MOUTH EVERY DAY, Disp: 90 tablet, Rfl: 0   sertraline  (ZOLOFT ) 100 MG tablet, Take 1 tablet (100 mg total) by mouth daily., Disp: 90 tablet, Rfl: 0   tirzepatide  (MOUNJARO ) 7.5  MG/0.5ML Pen, Inject 7.5 mg into the skin once a week., Disp: 2 mL, Rfl: 0  Consent:    NA  Disposition:   As needed  Jenna questions and concerns were addressed to Jenna satisfaction. She voices understanding of the recommendations provided during this encounter.    Signed, Madonna Michele HAS, [facility]  A Division of [facility] [address] [place], [place] [address]   "

## 2024-05-01 NOTE — Addendum Note (Signed)
 Addended by: CECELIA MADURA R on: 05/01/2024 09:51 AM   Modules accepted: Orders

## 2024-05-05 ENCOUNTER — Other Ambulatory Visit: Payer: Self-pay | Admitting: Hematology and Oncology

## 2024-05-05 ENCOUNTER — Inpatient Hospital Stay: Attending: Hematology and Oncology

## 2024-05-05 DIAGNOSIS — Z7982 Long term (current) use of aspirin: Secondary | ICD-10-CM | POA: Insufficient documentation

## 2024-05-05 DIAGNOSIS — D500 Iron deficiency anemia secondary to blood loss (chronic): Secondary | ICD-10-CM | POA: Insufficient documentation

## 2024-05-05 DIAGNOSIS — Z9851 Tubal ligation status: Secondary | ICD-10-CM | POA: Insufficient documentation

## 2024-05-05 DIAGNOSIS — Z79899 Other long term (current) drug therapy: Secondary | ICD-10-CM | POA: Diagnosis not present

## 2024-05-05 DIAGNOSIS — Z8249 Family history of ischemic heart disease and other diseases of the circulatory system: Secondary | ICD-10-CM | POA: Diagnosis not present

## 2024-05-05 DIAGNOSIS — Z9089 Acquired absence of other organs: Secondary | ICD-10-CM | POA: Diagnosis not present

## 2024-05-05 DIAGNOSIS — Z825 Family history of asthma and other chronic lower respiratory diseases: Secondary | ICD-10-CM | POA: Insufficient documentation

## 2024-05-05 DIAGNOSIS — F17210 Nicotine dependence, cigarettes, uncomplicated: Secondary | ICD-10-CM | POA: Insufficient documentation

## 2024-05-05 DIAGNOSIS — N920 Excessive and frequent menstruation with regular cycle: Secondary | ICD-10-CM | POA: Insufficient documentation

## 2024-05-05 DIAGNOSIS — J45909 Unspecified asthma, uncomplicated: Secondary | ICD-10-CM | POA: Diagnosis not present

## 2024-05-05 DIAGNOSIS — Z8632 Personal history of gestational diabetes: Secondary | ICD-10-CM | POA: Diagnosis not present

## 2024-05-05 DIAGNOSIS — Z833 Family history of diabetes mellitus: Secondary | ICD-10-CM | POA: Insufficient documentation

## 2024-05-05 DIAGNOSIS — D72829 Elevated white blood cell count, unspecified: Secondary | ICD-10-CM | POA: Insufficient documentation

## 2024-05-05 DIAGNOSIS — R9230 Dense breasts, unspecified: Secondary | ICD-10-CM | POA: Insufficient documentation

## 2024-05-05 DIAGNOSIS — I10 Essential (primary) hypertension: Secondary | ICD-10-CM | POA: Insufficient documentation

## 2024-05-05 LAB — CBC WITH DIFFERENTIAL (CANCER CENTER ONLY)
Abs Immature Granulocytes: 0.04 K/uL (ref 0.00–0.07)
Basophils Absolute: 0 K/uL (ref 0.0–0.1)
Basophils Relative: 0 %
Eosinophils Absolute: 0.3 K/uL (ref 0.0–0.5)
Eosinophils Relative: 2 %
HCT: 40.1 % (ref 36.0–46.0)
Hemoglobin: 12.1 g/dL (ref 12.0–15.0)
Immature Granulocytes: 0 %
Lymphocytes Relative: 27 %
Lymphs Abs: 3.4 K/uL (ref 0.7–4.0)
MCH: 22.1 pg — ABNORMAL LOW (ref 26.0–34.0)
MCHC: 30.2 g/dL (ref 30.0–36.0)
MCV: 73.3 fL — ABNORMAL LOW (ref 80.0–100.0)
Monocytes Absolute: 0.8 K/uL (ref 0.1–1.0)
Monocytes Relative: 7 %
Neutro Abs: 8.1 K/uL — ABNORMAL HIGH (ref 1.7–7.7)
Neutrophils Relative %: 64 %
Platelet Count: 307 K/uL (ref 150–400)
RBC: 5.47 MIL/uL — ABNORMAL HIGH (ref 3.87–5.11)
RDW: 20.1 % — ABNORMAL HIGH (ref 11.5–15.5)
WBC Count: 12.8 K/uL — ABNORMAL HIGH (ref 4.0–10.5)
nRBC: 0 % (ref 0.0–0.2)

## 2024-05-05 LAB — CMP (CANCER CENTER ONLY)
ALT: 14 U/L (ref 0–44)
AST: 15 U/L (ref 15–41)
Albumin: 4.3 g/dL (ref 3.5–5.0)
Alkaline Phosphatase: 72 U/L (ref 38–126)
Anion gap: 15 (ref 5–15)
BUN: 10 mg/dL (ref 6–20)
CO2: 23 mmol/L (ref 22–32)
Calcium: 9.2 mg/dL (ref 8.9–10.3)
Chloride: 104 mmol/L (ref 98–111)
Creatinine: 0.83 mg/dL (ref 0.44–1.00)
GFR, Estimated: >60 mL/min
Glucose, Bld: 138 mg/dL — ABNORMAL HIGH (ref 70–99)
Potassium: 3.8 mmol/L (ref 3.5–5.1)
Sodium: 141 mmol/L (ref 135–145)
Total Bilirubin: 0.4 mg/dL (ref 0.0–1.2)
Total Protein: 7.3 g/dL (ref 6.5–8.1)

## 2024-05-05 LAB — RETIC PANEL
Immature Retic Fract: 21 % — ABNORMAL HIGH (ref 2.3–15.9)
RBC.: 5.52 MIL/uL — ABNORMAL HIGH (ref 3.87–5.11)
Retic Count, Absolute: 111 K/uL (ref 19.0–186.0)
Retic Ct Pct: 2 % (ref 0.4–3.1)
Reticulocyte Hemoglobin: 25.4 pg — ABNORMAL LOW

## 2024-05-05 LAB — IRON AND IRON BINDING CAPACITY (CC-WL,HP ONLY)
Iron: 63 ug/dL (ref 28–170)
Saturation Ratios: 19 % (ref 10.4–31.8)
TIBC: 329 ug/dL (ref 250–450)
UIBC: 267 ug/dL

## 2024-05-05 LAB — FERRITIN: Ferritin: 171 ng/mL (ref 11–307)

## 2024-05-06 ENCOUNTER — Other Ambulatory Visit: Payer: Self-pay | Admitting: Internal Medicine

## 2024-05-11 NOTE — Progress Notes (Unsigned)
 " [facility] [phone]) [phone]   [phone]  PROGRESS NOTE  Patient Care Team: Joshua Debby CROME, MD as PCP - General (Internal Medicine) Michele Richardson, DO as PCP - Cardiology (Cardiology) Andria Pipe, MD Ermalinda Lenn [place], [place] as [facility] Care Management  Hematological/Oncological History # Iron  Deficiency Anemia 2/2 to GYN Bleeding 09/13/2021: WBC 11.1, Hgb 10.5, Plt 350, MCV 68.6, TIBC 543, Ferritin 2.9, Sat ratio 3.9%.   10/02/2021: establish care with Dr. Federico  10/30/2021: IV monoferric  1000 mg x 1 dose  11/27/2021: WBC 8.9, Hgb 11.1, MCV 72.2, and Plt 312  Interval History:  Jenna Stephens 49 y.o. female with medical history significant for iron  deficiency anemia secondary to GYN bleeding who presents for a follow up visit. ***  MEDICAL HISTORY:  Past Medical History:  Diagnosis Date   Acute respiratory failure (HCC) 12/29/2017   Anxiety    Asthma    Gestational diabetes    Hypertension    Iron  deficiency anemia    Obesity     SURGICAL HISTORY: Past Surgical History:  Procedure Laterality Date   TONSILLECTOMY     TUBAL LIGATION N/A 10/07/2018   Procedure: POST PARTUM TUBAL LIGATION;  Surgeon: Barbra Lang PARAS, DO;  Location: [facility] LD ORS;  Service: Gynecology;  Laterality: N/A;    SOCIAL HISTORY: Social History   Socioeconomic History   Marital status: Married    Spouse name: Not on file   Number of children: Not on file   Years of education: Not on file   Highest education level: Associate degree: occupational, scientist, product/process development, or vocational program  Occupational History   Not on file  Tobacco Use   Smoking status: Some Days    Current packs/day: 0.00    Average packs/day: 0.3 packs/day    Types: Cigarettes    Last attempt to quit: 01/06/2018    Years since quitting: 6.3    Passive exposure: Past   Smokeless tobacco: Never  Vaping Use   Vaping status: Never Used  Substance and Sexual Activity   Alcohol use: Yes    Alcohol/week: 2.0  standard drinks of alcohol    Types: 2 Standard drinks or equivalent per week   Drug use: Not Currently    Types: Marijuana   Sexual activity: Yes    Partners: Male    Birth control/protection: Surgical  Other Topics Concern   Not on file  Social History Narrative   ** Merged History Encounter **       Social Drivers of Health   Tobacco Use: High Risk (05/01/2024)   Patient History    Smoking Tobacco Use: Some Days    Smokeless Tobacco Use: Never    Passive Exposure: Past  Financial Resource Strain: Patient Declined (04/20/2024)   Overall Financial Resource Strain (CARDIA)    Difficulty of Paying Living Expenses: Patient declined  Food Insecurity: Patient Declined (04/20/2024)   Epic    Worried About Programme Researcher, Broadcasting/film/video in the Last Year: Patient declined    Barista in the Last Year: Patient declined  Transportation Needs: No Transportation Needs (04/20/2024)   Epic    Lack of Transportation (Medical): No    Lack of Transportation (Non-Medical): No  Physical Activity: Insufficiently Active (04/20/2024)   Exercise Vital Sign    Days of Exercise per Week: 1 day    Minutes of Exercise per Session: 30 min  Stress: Stress Concern Present (04/20/2024)   [employer] of Occupational Health - Occupational Stress Questionnaire  Feeling of Stress: Very much  Social Connections: Unknown (04/20/2024)   Social Connection and Isolation Panel    Frequency of Communication with Friends and Family: Patient declined    Frequency of Social Gatherings with Friends and Family: Patient declined    Attends Religious Services: Patient declined    Active Member of Clubs or Organizations: No    Attends Engineer, Structural: Not on file    Marital Status: Married  Catering Manager Violence: Not At Risk (03/03/2024)   Epic    Fear of Current or Ex-Partner: No    Emotionally Abused: No    Physically Abused: No    Sexually Abused: No  Depression (PHQ2-9): High Risk  (03/03/2024)   Depression (PHQ2-9)    PHQ-2 Score: 19  Alcohol Screen: Low Risk (04/20/2024)   Alcohol Screen    Last Alcohol Screening Score (AUDIT): 1  Housing: High Risk (04/20/2024)   Epic    Unable to Pay for Housing in the Last Year: Yes    Number of Times Moved in the Last Year: 1    Homeless in the Last Year: No  Utilities: Not At Risk (03/03/2024)   Epic    Threatened with loss of utilities: No  Health Literacy: Not on file    FAMILY HISTORY: Family History  Problem Relation Age of Onset   Diabetes Mother    Hypertension Mother    Hypertension Father    COPD Father     ALLERGIES:  has no known allergies.  MEDICATIONS:  Current Outpatient Medications  Medication Sig Dispense Refill   Albuterol -Budesonide  (AIRSUPRA ) 90-80 MCG/ACT AERO Inhale 2 puffs into the lungs every hour as needed (Maximum of 12 inhalations per day). 10.7 g 5   ALPRAZolam  (XANAX ) 1 MG tablet TAKE 1 TABLET BY MOUTH TWICE A DAY AS NEEDED FOR ANXIETY 60 tablet 0   amLODipine  (NORVASC ) 5 MG tablet Take 1 tablet (5 mg total) by mouth daily. 90 tablet 0   [facility] ASPIRIN  LOW DOSE 81 MG tablet TAKE 1 TABLET (81 MG TOTAL) BY MOUTH DAILY. SWALLOW WHOLE. (Patient taking differently: Take 81 mg by mouth daily in the afternoon. SWALLOW WHOLE.) 90 tablet 1   Ferric Maltol  (ACCRUFER ) 30 MG CAPS Take 1 capsule (30 mg total) by mouth in the morning and at bedtime. 180 capsule 0   ipratropium-albuterol  (DUONEB) 0.5-2.5 (3) MG/3ML SOLN Take 3 mLs by nebulization every 4 (four) hours as needed. (Patient taking differently: Take 3 mLs by nebulization as needed (wheezing/SOB).) 360 mL 1   nadolol  (CORGARD ) 20 MG tablet TAKE 1 TABLET BY MOUTH EVERY DAY 90 tablet 0   pantoprazole  (PROTONIX ) 40 MG tablet Take 1 tablet (40 mg total) by mouth daily. 30 tablet 3   rosuvastatin  (CRESTOR ) 20 MG tablet TAKE 1 TABLET BY MOUTH EVERY DAY 90 tablet 0   sertraline  (ZOLOFT ) 100 MG tablet Take 1 tablet (100 mg total) by mouth daily. 90  tablet 0   tirzepatide  (MOUNJARO ) 7.5 MG/0.5ML Pen Inject 7.5 mg into the skin once a week. 2 mL 0   No current facility-administered medications for this visit.    REVIEW OF SYSTEMS:   Constitutional: ( - ) fevers, ( - )  chills , ( - ) night sweats Eyes: ( - ) blurriness of vision, ( - ) double vision, ( - ) watery eyes Ears, nose, mouth, throat, and face: ( - ) mucositis, ( - ) sore throat Respiratory: ( - ) cough, ( - ) dyspnea, ( - )  wheezes Cardiovascular: ( - ) palpitation, ( - ) chest discomfort, ( - ) lower extremity swelling Gastrointestinal:  ( - ) nausea, ( - ) heartburn, ( - ) change in bowel habits Skin: ( - ) abnormal skin rashes Lymphatics: ( - ) new lymphadenopathy, ( - ) easy bruising Neurological: ( - ) numbness, ( - ) tingling, ( - ) new weaknesses Behavioral/Psych: ( - ) mood change, ( - ) new changes  All other systems were reviewed with the patient and are negative.  PHYSICAL EXAMINATION:  There were no vitals filed for this visit.   There were no vitals filed for this visit.    GENERAL: Well-appearing middle-aged African-American female, alert, no distress and comfortable SKIN: skin color, texture, turgor are normal, no rashes or significant lesions EYES: conjunctiva are pink and non-injected, sclera clear OROPHARYNX: no exudate, no erythema; lips, buccal mucosa, and tongue normal  NECK: supple, non-tender LYMPH:  no palpable lymphadenopathy in the cervical, axillary or inguinal LUNGS: clear to auscultation and percussion with normal breathing effort HEART: regular rate & rhythm and no murmurs and no lower extremity edema ABDOMEN: soft, non-tender, non-distended, normal bowel sounds Musculoskeletal: no cyanosis of digits and no clubbing  PSYCH: alert & oriented x 3, fluent speech NEURO: no focal motor/sensory deficits  LABORATORY DATA:  I have reviewed the data as listed    Latest Ref Rng & Units 05/05/2024    8:28 AM 02/25/2024   12:06 PM 02/25/2024     9:37 AM  CBC  WBC 4.0 - 10.5 K/uL 12.8  10.4  10.0   Hemoglobin 12.0 - 15.0 g/dL 87.8  89.9  89.7   Hematocrit 36.0 - 46.0 % 40.1  36.3  35.3   Platelets 150 - 400 K/uL 307  341  344        Latest Ref Rng & Units 05/05/2024    8:28 AM 02/25/2024   12:06 PM 02/25/2024    9:37 AM  CMP  Glucose 70 - 99 mg/dL 861  860  844   BUN 6 - 20 mg/dL 10  9  10    Creatinine 0.44 - 1.00 mg/dL 9.16  9.36  9.25   Sodium 135 - 145 mmol/L 141  140  141   Potassium 3.5 - 5.1 mmol/L 3.8  3.4  3.5   Chloride 98 - 111 mmol/L 104  103  105   CO2 22 - 32 mmol/L 23  25  30    Calcium  8.9 - 10.3 mg/dL 9.2  9.1  9.0   Total Protein 6.5 - 8.1 g/dL 7.3  7.8  7.3   Total Bilirubin 0.0 - 1.2 mg/dL 0.4  0.4  0.4   Alkaline Phos 38 - 126 U/L 72  86  77   AST 15 - 41 U/L 15  15  11    ALT 0 - 44 U/L 14  9  9      RADIOGRAPHIC STUDIES: MM 3D SCREENING MAMMOGRAM BILATERAL BREAST Result Date: 05/04/2024 CLINICAL DATA:  Screening. EXAM: DIGITAL SCREENING BILATERAL MAMMOGRAM WITH TOMOSYNTHESIS AND CAD TECHNIQUE: Bilateral screening digital craniocaudal and mediolateral oblique mammograms were obtained. Bilateral screening digital breast tomosynthesis was performed. The images were evaluated with computer-aided detection. COMPARISON:  Previous exam(s). ACR Breast Density Category b: There are scattered areas of fibroglandular density. FINDINGS: There are no findings suspicious for malignancy. IMPRESSION: No mammographic evidence of malignancy. A result letter of this screening mammogram will be mailed directly to the patient. RECOMMENDATION: Screening mammogram in one year. (  Code:SM-B-01Y) BI-RADS CATEGORY  1: Negative. Electronically Signed   By: Alm Parkins M.D.   On: 05/04/2024 07:47    ASSESSMENT & PLAN Jenna Stephens is a 66 y.o.y.o. female with medical history significant for iron  deficiency anemia secondary to GYN bleeding who presents for a follow up visit.    # Iron  Deficiency Anemia 2/2 to GYN Bleeding --  Findings are consistent with iron  deficiency anemia secondary to patient's menorrhagia --Encouraged her to follow-up with OB/GYN for better control of her menstrual cycles --Recommend she restart iron  pills ferrous sulfate  325 mg p.o. daily. --Last received IV venofer  200 mg 03/17/24-04/14/2024.  --Labs from 05/05/2024 showed no anemia with microcytosis with Hgb 12.1, MCV 73.3. Iron  panel shows ferritin 171, iron  63, saturation 19%.  --No need for additional IV iron  at this time.  --RTC in 3 months for labs only and 6 months for labs/follow up  #Leukocytosis--resolved  --Etiology is unclear, though the patient is an active smoker.  It is of neutrophilic predominance. --Labs from 05/05/2024 reviewed with WBC 12.8, ANC 8.1 --Stable for over several years. Monitor for now.   No orders of the defined types were placed in this encounter.   All questions were answered. The patient knows to call the clinic with any problems, questions or concerns.   I have spent a total of 30 minutes minutes of face-to-face and non-face-to-face time, preparing to see the patient,  performing a medically appropriate examination, counseling and educating the patient, documenting clinical information in the electronic health record, independently interpreting results and communicating results to the patient, and care coordination.   Johnston Police PA-C Dept of Hematology and Oncology [facility] Phone: [phone]   05/11/2024 9:43 PM  "

## 2024-05-12 ENCOUNTER — Inpatient Hospital Stay: Admitting: Physician Assistant

## 2024-05-12 VITALS — BP 117/82 | HR 81 | Temp 97.3°F | Resp 16 | Wt 265.5 lb

## 2024-05-12 DIAGNOSIS — D500 Iron deficiency anemia secondary to blood loss (chronic): Secondary | ICD-10-CM | POA: Diagnosis not present

## 2024-05-12 DIAGNOSIS — D72829 Elevated white blood cell count, unspecified: Secondary | ICD-10-CM

## 2024-05-15 ENCOUNTER — Telehealth: Payer: Self-pay | Admitting: *Deleted

## 2024-05-15 ENCOUNTER — Encounter: Payer: Self-pay | Admitting: *Deleted

## 2024-05-15 NOTE — Patient Instructions (Signed)
 Jenna Stephens - I have attempted to call you three times but have been unsuccessful in reaching you. I work with Joshua Debby CROME, MD and am calling to support your healthcare needs. If I can be of assistance to you, please contact me at [phone].     Thank you,    Destane Speas, LCSW [place]  [facility] Licensed Clinical Social Worker  Direct Dial: [phone]

## 2024-05-19 NOTE — Telephone Encounter (Signed)
-----   Message from [place], [place] sent at 04/20/2024  4:42 PM EST ----- Ms.Hilton GORMAN Finder Low risk stress test.  Hypertensive response with exercise, please continue to monitor blood pressures at home and review the trend with PCP to see if additional medication titration is warranted.  Dr. Tolia

## 2024-05-19 NOTE — Telephone Encounter (Signed)
 Left detailed message on voicemail per DPR. Relayed Dr. Tyree note.

## 2024-05-21 ENCOUNTER — Other Ambulatory Visit: Payer: Self-pay | Admitting: Pharmacist

## 2024-05-21 ENCOUNTER — Other Ambulatory Visit (HOSPITAL_COMMUNITY): Payer: Self-pay

## 2024-05-21 DIAGNOSIS — E119 Type 2 diabetes mellitus without complications: Secondary | ICD-10-CM

## 2024-05-21 MED ORDER — TIRZEPATIDE 10 MG/0.5ML ~~LOC~~ SOAJ
10.0000 mg | SUBCUTANEOUS | 0 refills | Status: AC
  Filled 2024-05-21: qty 2, 28d supply, fill #0

## 2024-05-21 NOTE — Telephone Encounter (Addendum)
 Communicated with patient via MyChart message regarding tolerating Mounjaro  7.5 mg well. Pt is due for a refill and would like to continue titrating medication. Will send Mounjaro  10 mg weekly.  Price when sent to the pharmacy showed [phone] copay. Spoke to [facility] regarding cost. Copay this time is $174 for 1 month but an evoucher was applied. The evoucher will no longer work after this fill due to reaching the max $1950 per year. Due to patient having a high deductible plan, the Mounjaro  copay is $1074. Provided this information to the patient and also informed her about the [employer] pay program, however the cost is still $450 per month through them.  Darrelyn Drum, PharmD, BCPS, CPP Clinical Pharmacist Practitioner [facility] [phone]

## 2024-05-28 ENCOUNTER — Encounter (HOSPITAL_COMMUNITY): Payer: Self-pay

## 2024-05-28 ENCOUNTER — Other Ambulatory Visit (HOSPITAL_COMMUNITY): Payer: Self-pay

## 2024-06-01 ENCOUNTER — Inpatient Hospital Stay

## 2024-06-01 ENCOUNTER — Inpatient Hospital Stay: Admitting: Hematology and Oncology

## 2024-06-04 ENCOUNTER — Encounter (HOSPITAL_BASED_OUTPATIENT_CLINIC_OR_DEPARTMENT_OTHER): Admitting: Obstetrics and Gynecology

## 2024-06-11 ENCOUNTER — Ambulatory Visit: Admitting: Internal Medicine

## 2024-08-07 ENCOUNTER — Inpatient Hospital Stay: Attending: Hematology and Oncology

## 2024-11-06 ENCOUNTER — Inpatient Hospital Stay: Admitting: Physician Assistant

## 2024-11-06 ENCOUNTER — Inpatient Hospital Stay: Attending: Hematology and Oncology
